# Patient Record
Sex: Female | Born: 1935
Health system: Southern US, Community
[De-identification: ages and names within clinical notes are randomized; demographics above are authoritative.]

## PROBLEM LIST (undated history)

## (undated) DIAGNOSIS — Z8673 Personal history of transient ischemic attack (TIA), and cerebral infarction without residual deficits: Secondary | ICD-10-CM

## (undated) DIAGNOSIS — K649 Unspecified hemorrhoids: Secondary | ICD-10-CM

## (undated) DIAGNOSIS — G459 Transient cerebral ischemic attack, unspecified: Secondary | ICD-10-CM

## (undated) DIAGNOSIS — U071 COVID-19: Secondary | ICD-10-CM

## (undated) DIAGNOSIS — S7292XA Unspecified fracture of left femur, initial encounter for closed fracture: Secondary | ICD-10-CM

## (undated) DIAGNOSIS — C189 Malignant neoplasm of colon, unspecified: Secondary | ICD-10-CM

## (undated) DIAGNOSIS — M545 Low back pain: Secondary | ICD-10-CM

## (undated) DIAGNOSIS — E785 Hyperlipidemia, unspecified: Secondary | ICD-10-CM

## (undated) DIAGNOSIS — Z66 Do not resuscitate: Secondary | ICD-10-CM

## (undated) DIAGNOSIS — K56609 Unspecified intestinal obstruction, unspecified as to partial versus complete obstruction: Secondary | ICD-10-CM

## (undated) DIAGNOSIS — D649 Anemia, unspecified: Secondary | ICD-10-CM

## (undated) DIAGNOSIS — K219 Gastro-esophageal reflux disease without esophagitis: Secondary | ICD-10-CM

## (undated) DIAGNOSIS — K5730 Diverticulosis of large intestine without perforation or abscess without bleeding: Secondary | ICD-10-CM

## (undated) DIAGNOSIS — J309 Allergic rhinitis, unspecified: Secondary | ICD-10-CM

## (undated) DIAGNOSIS — M8580 Other specified disorders of bone density and structure, unspecified site: Secondary | ICD-10-CM

## (undated) HISTORY — DX: Gastro-esophageal reflux disease without esophagitis: K21.9

## (undated) HISTORY — DX: Unspecified intestinal obstruction, unspecified as to partial versus complete obstruction: K56.609

## (undated) HISTORY — PX: APPENDECTOMY: SHX54

## (undated) HISTORY — DX: Diverticulosis of large intestine without perforation or abscess without bleeding: K57.30

## (undated) HISTORY — PX: JOINT REPLACEMENT: SHX530

## (undated) HISTORY — DX: Transient cerebral ischemic attack, unspecified: G45.9

## (undated) HISTORY — DX: Allergic rhinitis, unspecified: J30.9

## (undated) HISTORY — PX: BELPHAROPTOSIS REPAIR: SHX369

## (undated) HISTORY — DX: Malignant neoplasm of colon, unspecified: C18.9

## (undated) HISTORY — PX: KNEE ARTHROSCOPY: SUR90

## (undated) HISTORY — DX: Other specified disorders of bone density and structure, unspecified site: M85.80

## (undated) HISTORY — DX: Unspecified hemorrhoids: K64.9

## (undated) HISTORY — PX: ROTATOR CUFF REPAIR: SHX139

## (undated) HISTORY — DX: Hyperlipidemia, unspecified: E78.5

## (undated) HISTORY — PX: ABDOMINAL HYSTERECTOMY: SHX81

## (undated) HISTORY — DX: Anemia, unspecified: D64.9

## (undated) HISTORY — DX: Low back pain: M54.5

## (undated) HISTORY — PX: CATARACT EXTRACTION: SUR2

## (undated) HISTORY — PX: SHOULDER SURGERY: SHX246

## (undated) HISTORY — PX: VAGINAL PROLAPSE REPAIR: SHX830

---

## 1992-12-29 HISTORY — PX: HEMICOLECTOMY: SHX854

## 1995-11-03 ENCOUNTER — Encounter: Payer: Self-pay | Admitting: Gastroenterology

## 1998-04-04 ENCOUNTER — Other Ambulatory Visit: Admission: RE | Admit: 1998-04-04 | Discharge: 1998-04-04 | Payer: Self-pay | Admitting: Oncology

## 1998-12-17 ENCOUNTER — Encounter: Payer: Self-pay | Admitting: Gastroenterology

## 1998-12-17 ENCOUNTER — Ambulatory Visit (HOSPITAL_COMMUNITY): Admission: RE | Admit: 1998-12-17 | Discharge: 1998-12-17 | Payer: Self-pay | Admitting: Gastroenterology

## 1999-01-10 ENCOUNTER — Other Ambulatory Visit: Admission: RE | Admit: 1999-01-10 | Discharge: 1999-01-10 | Payer: Self-pay | Admitting: Gastroenterology

## 1999-01-10 ENCOUNTER — Encounter: Payer: Self-pay | Admitting: Gastroenterology

## 1999-09-24 ENCOUNTER — Other Ambulatory Visit: Admission: RE | Admit: 1999-09-24 | Discharge: 1999-09-24 | Payer: Self-pay | Admitting: Internal Medicine

## 2000-01-10 ENCOUNTER — Encounter: Payer: Self-pay | Admitting: Orthopedic Surgery

## 2000-01-14 ENCOUNTER — Inpatient Hospital Stay (HOSPITAL_COMMUNITY): Admission: RE | Admit: 2000-01-14 | Discharge: 2000-01-17 | Payer: Self-pay | Admitting: Orthopedic Surgery

## 2000-01-17 ENCOUNTER — Inpatient Hospital Stay (HOSPITAL_COMMUNITY)
Admission: RE | Admit: 2000-01-17 | Discharge: 2000-01-22 | Payer: Self-pay | Admitting: Physical Medicine & Rehabilitation

## 2000-01-24 ENCOUNTER — Encounter
Admission: RE | Admit: 2000-01-24 | Discharge: 2000-02-28 | Payer: Self-pay | Admitting: Physical Medicine & Rehabilitation

## 2000-03-31 ENCOUNTER — Encounter: Payer: Self-pay | Admitting: Orthopedic Surgery

## 2000-04-02 ENCOUNTER — Inpatient Hospital Stay (HOSPITAL_COMMUNITY): Admission: RE | Admit: 2000-04-02 | Discharge: 2000-04-04 | Payer: Self-pay | Admitting: Orthopedic Surgery

## 2000-06-12 ENCOUNTER — Encounter: Payer: Self-pay | Admitting: Internal Medicine

## 2000-06-12 ENCOUNTER — Encounter: Admission: RE | Admit: 2000-06-12 | Discharge: 2000-06-12 | Payer: Self-pay | Admitting: Internal Medicine

## 2001-02-03 ENCOUNTER — Encounter: Payer: Self-pay | Admitting: Gastroenterology

## 2001-05-14 ENCOUNTER — Encounter: Payer: Self-pay | Admitting: Internal Medicine

## 2001-05-14 ENCOUNTER — Encounter: Admission: RE | Admit: 2001-05-14 | Discharge: 2001-05-14 | Payer: Self-pay | Admitting: Internal Medicine

## 2001-06-28 ENCOUNTER — Encounter (INDEPENDENT_AMBULATORY_CARE_PROVIDER_SITE_OTHER): Payer: Self-pay

## 2001-06-28 ENCOUNTER — Inpatient Hospital Stay (HOSPITAL_COMMUNITY): Admission: RE | Admit: 2001-06-28 | Discharge: 2001-07-01 | Payer: Self-pay | Admitting: Obstetrics and Gynecology

## 2001-09-22 ENCOUNTER — Encounter (HOSPITAL_COMMUNITY): Admission: RE | Admit: 2001-09-22 | Discharge: 2001-10-22 | Payer: Self-pay | Admitting: Oncology

## 2001-09-22 ENCOUNTER — Encounter: Admission: RE | Admit: 2001-09-22 | Discharge: 2001-09-22 | Payer: Self-pay | Admitting: Oncology

## 2002-01-06 ENCOUNTER — Encounter: Payer: Self-pay | Admitting: Internal Medicine

## 2002-01-06 ENCOUNTER — Ambulatory Visit: Admission: RE | Admit: 2002-01-06 | Discharge: 2002-01-06 | Payer: Self-pay | Admitting: Internal Medicine

## 2002-05-18 ENCOUNTER — Encounter: Admission: RE | Admit: 2002-05-18 | Discharge: 2002-05-18 | Payer: Self-pay | Admitting: Internal Medicine

## 2002-05-18 ENCOUNTER — Encounter: Payer: Self-pay | Admitting: Internal Medicine

## 2002-10-05 ENCOUNTER — Encounter (HOSPITAL_COMMUNITY): Admission: RE | Admit: 2002-10-05 | Discharge: 2002-11-04 | Payer: Self-pay | Admitting: Oncology

## 2002-10-05 ENCOUNTER — Encounter: Admission: RE | Admit: 2002-10-05 | Discharge: 2002-10-05 | Payer: Self-pay | Admitting: Oncology

## 2002-10-07 ENCOUNTER — Encounter (HOSPITAL_COMMUNITY): Payer: Self-pay | Admitting: Oncology

## 2002-10-07 ENCOUNTER — Ambulatory Visit (HOSPITAL_COMMUNITY): Admission: RE | Admit: 2002-10-07 | Discharge: 2002-10-07 | Payer: Self-pay | Admitting: Oncology

## 2003-02-18 ENCOUNTER — Emergency Department (HOSPITAL_COMMUNITY): Admission: EM | Admit: 2003-02-18 | Discharge: 2003-02-18 | Payer: Self-pay | Admitting: Emergency Medicine

## 2003-03-19 ENCOUNTER — Ambulatory Visit (HOSPITAL_COMMUNITY): Admission: RE | Admit: 2003-03-19 | Discharge: 2003-03-19 | Payer: Self-pay | Admitting: Neurosurgery

## 2003-04-26 ENCOUNTER — Ambulatory Visit (HOSPITAL_COMMUNITY): Admission: RE | Admit: 2003-04-26 | Discharge: 2003-04-26 | Payer: Self-pay | Admitting: Orthopedic Surgery

## 2003-04-26 ENCOUNTER — Encounter: Payer: Self-pay | Admitting: Orthopedic Surgery

## 2003-05-23 ENCOUNTER — Encounter: Payer: Self-pay | Admitting: Internal Medicine

## 2003-05-23 ENCOUNTER — Encounter: Admission: RE | Admit: 2003-05-23 | Discharge: 2003-05-23 | Payer: Self-pay | Admitting: Internal Medicine

## 2003-08-03 ENCOUNTER — Ambulatory Visit (HOSPITAL_COMMUNITY): Admission: RE | Admit: 2003-08-03 | Discharge: 2003-08-03 | Payer: Self-pay | Admitting: Gastroenterology

## 2003-08-03 ENCOUNTER — Encounter: Payer: Self-pay | Admitting: Gastroenterology

## 2003-08-17 ENCOUNTER — Encounter: Payer: Self-pay | Admitting: Gastroenterology

## 2003-08-17 ENCOUNTER — Ambulatory Visit (HOSPITAL_COMMUNITY): Admission: RE | Admit: 2003-08-17 | Discharge: 2003-08-17 | Payer: Self-pay | Admitting: Gastroenterology

## 2003-10-06 ENCOUNTER — Encounter (HOSPITAL_COMMUNITY): Admission: RE | Admit: 2003-10-06 | Discharge: 2003-11-05 | Payer: Self-pay | Admitting: Oncology

## 2003-10-06 ENCOUNTER — Encounter: Payer: Self-pay | Admitting: Gastroenterology

## 2003-10-06 ENCOUNTER — Encounter: Admission: RE | Admit: 2003-10-06 | Discharge: 2003-10-06 | Payer: Self-pay | Admitting: Oncology

## 2003-11-17 ENCOUNTER — Encounter (HOSPITAL_COMMUNITY): Admission: RE | Admit: 2003-11-17 | Discharge: 2003-12-27 | Payer: Self-pay | Admitting: Oncology

## 2003-11-27 ENCOUNTER — Encounter: Admission: RE | Admit: 2003-11-27 | Discharge: 2003-11-27 | Payer: Self-pay | Admitting: Oncology

## 2004-03-14 ENCOUNTER — Observation Stay (HOSPITAL_COMMUNITY): Admission: RE | Admit: 2004-03-14 | Discharge: 2004-03-15 | Payer: Self-pay | Admitting: Obstetrics and Gynecology

## 2004-05-24 ENCOUNTER — Encounter: Admission: RE | Admit: 2004-05-24 | Discharge: 2004-05-24 | Payer: Self-pay | Admitting: Internal Medicine

## 2004-10-02 ENCOUNTER — Encounter: Payer: Self-pay | Admitting: Gastroenterology

## 2004-10-02 ENCOUNTER — Encounter: Admission: RE | Admit: 2004-10-02 | Discharge: 2004-10-02 | Payer: Self-pay | Admitting: Oncology

## 2004-10-02 ENCOUNTER — Encounter (HOSPITAL_COMMUNITY): Admission: RE | Admit: 2004-10-02 | Discharge: 2004-11-01 | Payer: Self-pay | Admitting: Oncology

## 2004-12-18 ENCOUNTER — Ambulatory Visit: Payer: Self-pay | Admitting: Internal Medicine

## 2004-12-20 ENCOUNTER — Encounter: Admission: RE | Admit: 2004-12-20 | Discharge: 2004-12-20 | Payer: Self-pay | Admitting: Internal Medicine

## 2005-01-01 ENCOUNTER — Ambulatory Visit: Payer: Self-pay | Admitting: Internal Medicine

## 2005-01-30 ENCOUNTER — Ambulatory Visit: Payer: Self-pay | Admitting: Internal Medicine

## 2005-01-30 ENCOUNTER — Inpatient Hospital Stay (HOSPITAL_COMMUNITY): Admission: EM | Admit: 2005-01-30 | Discharge: 2005-02-11 | Payer: Self-pay | Admitting: Internal Medicine

## 2005-02-02 ENCOUNTER — Encounter (INDEPENDENT_AMBULATORY_CARE_PROVIDER_SITE_OTHER): Payer: Self-pay | Admitting: *Deleted

## 2005-02-02 ENCOUNTER — Encounter (INDEPENDENT_AMBULATORY_CARE_PROVIDER_SITE_OTHER): Payer: Self-pay | Admitting: Specialist

## 2005-02-27 ENCOUNTER — Ambulatory Visit: Payer: Self-pay | Admitting: Internal Medicine

## 2005-03-27 ENCOUNTER — Ambulatory Visit: Payer: Self-pay | Admitting: Internal Medicine

## 2005-05-27 ENCOUNTER — Encounter: Admission: RE | Admit: 2005-05-27 | Discharge: 2005-05-27 | Payer: Self-pay | Admitting: Internal Medicine

## 2005-07-24 ENCOUNTER — Ambulatory Visit: Payer: Self-pay | Admitting: Internal Medicine

## 2005-08-08 ENCOUNTER — Ambulatory Visit: Payer: Self-pay | Admitting: Internal Medicine

## 2005-09-05 ENCOUNTER — Ambulatory Visit: Payer: Self-pay | Admitting: Internal Medicine

## 2005-09-05 ENCOUNTER — Encounter: Admission: RE | Admit: 2005-09-05 | Discharge: 2005-09-05 | Payer: Self-pay | Admitting: Internal Medicine

## 2005-09-16 ENCOUNTER — Ambulatory Visit: Payer: Self-pay | Admitting: Internal Medicine

## 2005-12-09 ENCOUNTER — Ambulatory Visit: Payer: Self-pay | Admitting: Internal Medicine

## 2005-12-14 ENCOUNTER — Encounter: Admission: RE | Admit: 2005-12-14 | Discharge: 2005-12-14 | Payer: Self-pay | Admitting: Internal Medicine

## 2005-12-19 ENCOUNTER — Ambulatory Visit: Payer: Self-pay | Admitting: Internal Medicine

## 2005-12-29 HISTORY — PX: BACK SURGERY: SHX140

## 2006-06-16 ENCOUNTER — Encounter: Admission: RE | Admit: 2006-06-16 | Discharge: 2006-06-16 | Payer: Self-pay | Admitting: Neurosurgery

## 2006-06-22 ENCOUNTER — Encounter: Admission: RE | Admit: 2006-06-22 | Discharge: 2006-06-22 | Payer: Self-pay | Admitting: Internal Medicine

## 2006-07-10 ENCOUNTER — Inpatient Hospital Stay (HOSPITAL_COMMUNITY): Admission: RE | Admit: 2006-07-10 | Discharge: 2006-07-13 | Payer: Self-pay | Admitting: Neurosurgery

## 2006-08-19 ENCOUNTER — Encounter: Admission: RE | Admit: 2006-08-19 | Discharge: 2006-08-19 | Payer: Self-pay | Admitting: Neurosurgery

## 2006-09-10 ENCOUNTER — Ambulatory Visit: Payer: Self-pay | Admitting: Gastroenterology

## 2006-10-30 ENCOUNTER — Ambulatory Visit: Payer: Self-pay | Admitting: Internal Medicine

## 2006-11-17 ENCOUNTER — Ambulatory Visit: Payer: Self-pay | Admitting: Internal Medicine

## 2006-11-17 DIAGNOSIS — D649 Anemia, unspecified: Secondary | ICD-10-CM

## 2006-11-17 DIAGNOSIS — K219 Gastro-esophageal reflux disease without esophagitis: Secondary | ICD-10-CM | POA: Insufficient documentation

## 2006-11-17 DIAGNOSIS — M899 Disorder of bone, unspecified: Secondary | ICD-10-CM | POA: Insufficient documentation

## 2006-11-17 DIAGNOSIS — E785 Hyperlipidemia, unspecified: Secondary | ICD-10-CM

## 2006-11-17 DIAGNOSIS — M8580 Other specified disorders of bone density and structure, unspecified site: Secondary | ICD-10-CM

## 2006-11-17 DIAGNOSIS — M949 Disorder of cartilage, unspecified: Secondary | ICD-10-CM

## 2006-11-17 HISTORY — DX: Gastro-esophageal reflux disease without esophagitis: K21.9

## 2006-11-17 HISTORY — DX: Other specified disorders of bone density and structure, unspecified site: M85.80

## 2006-11-17 HISTORY — DX: Hyperlipidemia, unspecified: E78.5

## 2006-11-17 HISTORY — DX: Anemia, unspecified: D64.9

## 2006-11-17 LAB — CONVERTED CEMR LAB
ALT: 14 units/L (ref 0–40)
AST: 26 units/L (ref 0–37)
Albumin: 3.6 g/dL (ref 3.5–5.2)
Alkaline Phosphatase: 60 units/L (ref 39–117)
BUN: 12 mg/dL (ref 6–23)
Bilirubin, Direct: 0.1 mg/dL (ref 0.0–0.3)
CEA: 1.5 ng/mL (ref 0.0–5.0)
CO2: 30 meq/L (ref 19–32)
Calcium: 9.1 mg/dL (ref 8.4–10.5)
Chloride: 104 meq/L (ref 96–112)
Creatinine, Ser: 0.6 mg/dL (ref 0.4–1.2)
GFR calc non Af Amer: 101 mL/min
Glomerular Filtration Rate, Af Am: 123 mL/min/{1.73_m2}
Glucose, Bld: 97 mg/dL (ref 70–99)
HCT: 32.3 % — ABNORMAL LOW (ref 36.0–46.0)
Hemoglobin: 10.6 g/dL — ABNORMAL LOW (ref 12.0–15.0)
MCHC: 32.8 g/dL (ref 30.0–36.0)
MCV: 86.9 fL (ref 78.0–100.0)
Platelets: 264 10*3/uL (ref 150–400)
Potassium: 4.3 meq/L (ref 3.5–5.1)
RBC: 3.71 M/uL — ABNORMAL LOW (ref 3.87–5.11)
RDW: 13.5 % (ref 11.5–14.6)
Sodium: 140 meq/L (ref 135–145)
Total Bilirubin: 0.8 mg/dL (ref 0.3–1.2)
Total Protein: 6.9 g/dL (ref 6.0–8.3)
WBC: 5.3 10*3/uL (ref 4.5–10.5)

## 2006-12-08 ENCOUNTER — Ambulatory Visit: Payer: Self-pay | Admitting: Internal Medicine

## 2006-12-08 LAB — CONVERTED CEMR LAB
Cholesterol, target level: 200 mg/dL
HDL goal, serum: 40 mg/dL
LDL Goal: 130 mg/dL

## 2006-12-29 HISTORY — PX: PARTIAL COLECTOMY: SHX5273

## 2007-01-15 ENCOUNTER — Ambulatory Visit: Payer: Self-pay | Admitting: Internal Medicine

## 2007-02-12 ENCOUNTER — Ambulatory Visit: Payer: Self-pay | Admitting: Internal Medicine

## 2007-03-30 ENCOUNTER — Ambulatory Visit: Payer: Self-pay | Admitting: Gastroenterology

## 2007-04-12 ENCOUNTER — Ambulatory Visit: Payer: Self-pay | Admitting: Gastroenterology

## 2007-04-12 ENCOUNTER — Encounter: Payer: Self-pay | Admitting: Internal Medicine

## 2007-04-12 LAB — CONVERTED CEMR LAB
BUN: 8 mg/dL (ref 6–23)
Creatinine, Ser: 0.5 mg/dL (ref 0.4–1.2)

## 2007-04-14 ENCOUNTER — Ambulatory Visit: Payer: Self-pay | Admitting: *Deleted

## 2007-04-14 ENCOUNTER — Ambulatory Visit: Payer: Self-pay | Admitting: Internal Medicine

## 2007-05-11 ENCOUNTER — Encounter: Payer: Self-pay | Admitting: Gastroenterology

## 2007-05-11 ENCOUNTER — Encounter (INDEPENDENT_AMBULATORY_CARE_PROVIDER_SITE_OTHER): Payer: Self-pay | Admitting: *Deleted

## 2007-05-11 ENCOUNTER — Inpatient Hospital Stay (HOSPITAL_COMMUNITY): Admission: RE | Admit: 2007-05-11 | Discharge: 2007-05-15 | Payer: Self-pay | Admitting: Surgery

## 2007-05-11 ENCOUNTER — Encounter (INDEPENDENT_AMBULATORY_CARE_PROVIDER_SITE_OTHER): Payer: Self-pay | Admitting: Specialist

## 2007-05-13 ENCOUNTER — Ambulatory Visit: Payer: Self-pay | Admitting: Oncology

## 2007-05-16 ENCOUNTER — Encounter (INDEPENDENT_AMBULATORY_CARE_PROVIDER_SITE_OTHER): Payer: Self-pay | Admitting: *Deleted

## 2007-05-17 ENCOUNTER — Ambulatory Visit: Payer: Self-pay | Admitting: Oncology

## 2007-05-31 ENCOUNTER — Encounter: Payer: Self-pay | Admitting: Internal Medicine

## 2007-06-09 ENCOUNTER — Encounter (INDEPENDENT_AMBULATORY_CARE_PROVIDER_SITE_OTHER): Payer: Self-pay | Admitting: *Deleted

## 2007-06-10 ENCOUNTER — Ambulatory Visit (HOSPITAL_COMMUNITY): Admission: RE | Admit: 2007-06-10 | Discharge: 2007-06-10 | Payer: Self-pay | Admitting: Oncology

## 2007-06-24 ENCOUNTER — Encounter: Payer: Self-pay | Admitting: Gastroenterology

## 2007-06-24 LAB — CBC WITH DIFFERENTIAL/PLATELET
BASO%: 0.3 % (ref 0.0–2.0)
Basophils Absolute: 0 10*3/uL (ref 0.0–0.1)
EOS%: 3.4 % (ref 0.0–7.0)
Eosinophils Absolute: 0.2 10*3/uL (ref 0.0–0.5)
HCT: 25.7 % — ABNORMAL LOW (ref 34.8–46.6)
HGB: 8.6 g/dL — ABNORMAL LOW (ref 11.6–15.9)
LYMPH%: 30.3 % (ref 14.0–48.0)
MCH: 27 pg (ref 26.0–34.0)
MCHC: 33.4 g/dL (ref 32.0–36.0)
MCV: 80.9 fL — ABNORMAL LOW (ref 81.0–101.0)
MONO#: 0.7 10*3/uL (ref 0.1–0.9)
MONO%: 11.8 % (ref 0.0–13.0)
NEUT#: 3.2 10*3/uL (ref 1.5–6.5)
NEUT%: 54.2 % (ref 39.6–76.8)
Platelets: 268 10*3/uL (ref 145–400)
RBC: 3.17 10*6/uL — ABNORMAL LOW (ref 3.70–5.32)
RDW: 16.4 % — ABNORMAL HIGH (ref 11.3–14.5)
WBC: 5.9 10*3/uL (ref 3.9–10.0)
lymph#: 1.8 10*3/uL (ref 0.9–3.3)

## 2007-06-24 LAB — COMPREHENSIVE METABOLIC PANEL
ALT: 9 U/L (ref 0–35)
AST: 19 U/L (ref 0–37)
Albumin: 3.8 g/dL (ref 3.5–5.2)
Alkaline Phosphatase: 58 U/L (ref 39–117)
BUN: 11 mg/dL (ref 6–23)
CO2: 28 mEq/L (ref 19–32)
Calcium: 8.8 mg/dL (ref 8.4–10.5)
Chloride: 102 mEq/L (ref 96–112)
Creatinine, Ser: 0.58 mg/dL (ref 0.40–1.20)
Glucose, Bld: 92 mg/dL (ref 70–99)
Potassium: 4.1 mEq/L (ref 3.5–5.3)
Sodium: 137 mEq/L (ref 135–145)
Total Bilirubin: 0.3 mg/dL (ref 0.3–1.2)
Total Protein: 6.6 g/dL (ref 6.0–8.3)

## 2007-06-30 ENCOUNTER — Ambulatory Visit: Payer: Self-pay | Admitting: Oncology

## 2007-07-01 ENCOUNTER — Encounter: Admission: RE | Admit: 2007-07-01 | Discharge: 2007-07-01 | Payer: Self-pay | Admitting: Internal Medicine

## 2007-07-05 LAB — CBC WITH DIFFERENTIAL/PLATELET
BASO%: 0.6 % (ref 0.0–2.0)
Basophils Absolute: 0 10*3/uL (ref 0.0–0.1)
EOS%: 4.7 % (ref 0.0–7.0)
Eosinophils Absolute: 0.2 10*3/uL (ref 0.0–0.5)
HCT: 27.1 % — ABNORMAL LOW (ref 34.8–46.6)
HGB: 9.1 g/dL — ABNORMAL LOW (ref 11.6–15.9)
LYMPH%: 35.4 % (ref 14.0–48.0)
MCH: 27.8 pg (ref 26.0–34.0)
MCHC: 33.5 g/dL (ref 32.0–36.0)
MCV: 82.9 fL (ref 81.0–101.0)
MONO#: 0.6 10*3/uL (ref 0.1–0.9)
MONO%: 13.3 % — ABNORMAL HIGH (ref 0.0–13.0)
NEUT#: 2 10*3/uL (ref 1.5–6.5)
NEUT%: 46 % (ref 39.6–76.8)
Platelets: 240 10*3/uL (ref 145–400)
RBC: 3.27 10*6/uL — ABNORMAL LOW (ref 3.70–5.32)
RDW: 20.3 % — ABNORMAL HIGH (ref 11.3–14.5)
WBC: 4.4 10*3/uL (ref 3.9–10.0)
lymph#: 1.5 10*3/uL (ref 0.9–3.3)

## 2007-07-06 ENCOUNTER — Ambulatory Visit: Payer: Self-pay | Admitting: Internal Medicine

## 2007-07-15 ENCOUNTER — Encounter: Payer: Self-pay | Admitting: Internal Medicine

## 2007-07-15 LAB — CBC WITH DIFFERENTIAL/PLATELET
BASO%: 0.6 % (ref 0.0–2.0)
Basophils Absolute: 0 10*3/uL (ref 0.0–0.1)
EOS%: 4.2 % (ref 0.0–7.0)
Eosinophils Absolute: 0.2 10*3/uL (ref 0.0–0.5)
HCT: 29.5 % — ABNORMAL LOW (ref 34.8–46.6)
HGB: 9.9 g/dL — ABNORMAL LOW (ref 11.6–15.9)
LYMPH%: 37.4 % (ref 14.0–48.0)
MCH: 28.7 pg (ref 26.0–34.0)
MCHC: 33.6 g/dL (ref 32.0–36.0)
MCV: 85.4 fL (ref 81.0–101.0)
MONO#: 0.7 10*3/uL (ref 0.1–0.9)
MONO%: 14.9 % — ABNORMAL HIGH (ref 0.0–13.0)
NEUT#: 2 10*3/uL (ref 1.5–6.5)
NEUT%: 42.9 % (ref 39.6–76.8)
Platelets: 223 10*3/uL (ref 145–400)
RBC: 3.46 10*6/uL — ABNORMAL LOW (ref 3.70–5.32)
RDW: 25.5 % — ABNORMAL HIGH (ref 11.3–14.5)
WBC: 4.6 10*3/uL (ref 3.9–10.0)
lymph#: 1.7 10*3/uL (ref 0.9–3.3)

## 2007-07-15 LAB — COMPREHENSIVE METABOLIC PANEL
ALT: 9 U/L (ref 0–35)
AST: 20 U/L (ref 0–37)
Albumin: 3.9 g/dL (ref 3.5–5.2)
Alkaline Phosphatase: 52 U/L (ref 39–117)
BUN: 10 mg/dL (ref 6–23)
CO2: 28 mEq/L (ref 19–32)
Calcium: 8.6 mg/dL (ref 8.4–10.5)
Chloride: 103 mEq/L (ref 96–112)
Creatinine, Ser: 0.6 mg/dL (ref 0.40–1.20)
Glucose, Bld: 82 mg/dL (ref 70–99)
Potassium: 4.5 mEq/L (ref 3.5–5.3)
Sodium: 140 mEq/L (ref 135–145)
Total Bilirubin: 0.3 mg/dL (ref 0.3–1.2)
Total Protein: 6.6 g/dL (ref 6.0–8.3)

## 2007-08-06 LAB — COMPREHENSIVE METABOLIC PANEL
ALT: 9 U/L (ref 0–35)
AST: 20 U/L (ref 0–37)
Albumin: 3.8 g/dL (ref 3.5–5.2)
Alkaline Phosphatase: 44 U/L (ref 39–117)
BUN: 14 mg/dL (ref 6–23)
CO2: 27 mEq/L (ref 19–32)
Calcium: 9.4 mg/dL (ref 8.4–10.5)
Chloride: 104 mEq/L (ref 96–112)
Creatinine, Ser: 0.59 mg/dL (ref 0.40–1.20)
Glucose, Bld: 81 mg/dL (ref 70–99)
Potassium: 4.1 mEq/L (ref 3.5–5.3)
Sodium: 140 mEq/L (ref 135–145)
Total Bilirubin: 0.5 mg/dL (ref 0.3–1.2)
Total Protein: 6.4 g/dL (ref 6.0–8.3)

## 2007-08-06 LAB — CBC WITH DIFFERENTIAL/PLATELET
BASO%: 0.7 % (ref 0.0–2.0)
Basophils Absolute: 0 10*3/uL (ref 0.0–0.1)
EOS%: 2.5 % (ref 0.0–7.0)
Eosinophils Absolute: 0.1 10*3/uL (ref 0.0–0.5)
HCT: 31.2 % — ABNORMAL LOW (ref 34.8–46.6)
HGB: 10.5 g/dL — ABNORMAL LOW (ref 11.6–15.9)
LYMPH%: 41.6 % (ref 14.0–48.0)
MCH: 30 pg (ref 26.0–34.0)
MCHC: 33.7 g/dL (ref 32.0–36.0)
MCV: 88.9 fL (ref 81.0–101.0)
MONO#: 0.5 10*3/uL (ref 0.1–0.9)
MONO%: 9.2 % (ref 0.0–13.0)
NEUT#: 2.6 10*3/uL (ref 1.5–6.5)
NEUT%: 46 % (ref 39.6–76.8)
Platelets: 204 10*3/uL (ref 145–400)
RBC: 3.5 10*6/uL — ABNORMAL LOW (ref 3.70–5.32)
RDW: 28.7 % — ABNORMAL HIGH (ref 11.3–14.5)
WBC: 5.6 10*3/uL (ref 3.9–10.0)
lymph#: 2.3 10*3/uL (ref 0.9–3.3)

## 2007-08-19 ENCOUNTER — Ambulatory Visit: Payer: Self-pay | Admitting: Internal Medicine

## 2007-08-19 DIAGNOSIS — Z85038 Personal history of other malignant neoplasm of large intestine: Secondary | ICD-10-CM | POA: Insufficient documentation

## 2007-08-19 DIAGNOSIS — C189 Malignant neoplasm of colon, unspecified: Secondary | ICD-10-CM

## 2007-08-19 HISTORY — DX: Malignant neoplasm of colon, unspecified: C18.9

## 2007-08-26 ENCOUNTER — Ambulatory Visit: Payer: Self-pay | Admitting: Oncology

## 2007-08-31 ENCOUNTER — Telehealth: Payer: Self-pay | Admitting: Internal Medicine

## 2007-08-31 LAB — CBC WITH DIFFERENTIAL/PLATELET
BASO%: 0.9 % (ref 0.0–2.0)
Basophils Absolute: 0.1 10*3/uL (ref 0.0–0.1)
EOS%: 2.2 % (ref 0.0–7.0)
Eosinophils Absolute: 0.1 10*3/uL (ref 0.0–0.5)
HCT: 30.9 % — ABNORMAL LOW (ref 34.8–46.6)
HGB: 10.8 g/dL — ABNORMAL LOW (ref 11.6–15.9)
LYMPH%: 33.4 % (ref 14.0–48.0)
MCH: 32.4 pg (ref 26.0–34.0)
MCHC: 35 g/dL (ref 32.0–36.0)
MCV: 92.6 fL (ref 81.0–101.0)
MONO#: 0.5 10*3/uL (ref 0.1–0.9)
MONO%: 9 % (ref 0.0–13.0)
NEUT#: 3 10*3/uL (ref 1.5–6.5)
NEUT%: 54.5 % (ref 39.6–76.8)
Platelets: 183 10*3/uL (ref 145–400)
RBC: 3.34 10*6/uL — ABNORMAL LOW (ref 3.70–5.32)
RDW: 28.9 % — ABNORMAL HIGH (ref 11.3–14.5)
WBC: 5.6 10*3/uL (ref 3.9–10.0)
lymph#: 1.9 10*3/uL (ref 0.9–3.3)

## 2007-09-03 ENCOUNTER — Telehealth: Payer: Self-pay | Admitting: Internal Medicine

## 2007-09-13 ENCOUNTER — Encounter: Payer: Self-pay | Admitting: Internal Medicine

## 2007-09-21 ENCOUNTER — Encounter: Payer: Self-pay | Admitting: Internal Medicine

## 2007-09-21 LAB — COMPREHENSIVE METABOLIC PANEL
ALT: 12 U/L (ref 0–35)
AST: 19 U/L (ref 0–37)
Albumin: 4.1 g/dL (ref 3.5–5.2)
Alkaline Phosphatase: 53 U/L (ref 39–117)
BUN: 14 mg/dL (ref 6–23)
CO2: 28 mEq/L (ref 19–32)
Calcium: 9 mg/dL (ref 8.4–10.5)
Chloride: 102 mEq/L (ref 96–112)
Creatinine, Ser: 0.65 mg/dL (ref 0.40–1.20)
Glucose, Bld: 96 mg/dL (ref 70–99)
Potassium: 4.1 mEq/L (ref 3.5–5.3)
Sodium: 138 mEq/L (ref 135–145)
Total Bilirubin: 0.4 mg/dL (ref 0.3–1.2)
Total Protein: 7 g/dL (ref 6.0–8.3)

## 2007-09-21 LAB — CBC WITH DIFFERENTIAL/PLATELET
BASO%: 0.8 % (ref 0.0–2.0)
Basophils Absolute: 0.1 10*3/uL (ref 0.0–0.1)
EOS%: 0.9 % (ref 0.0–7.0)
Eosinophils Absolute: 0.1 10*3/uL (ref 0.0–0.5)
HCT: 33.1 % — ABNORMAL LOW (ref 34.8–46.6)
HGB: 11.4 g/dL — ABNORMAL LOW (ref 11.6–15.9)
LYMPH%: 27.3 % (ref 14.0–48.0)
MCH: 33.2 pg (ref 26.0–34.0)
MCHC: 34.5 g/dL (ref 32.0–36.0)
MCV: 96.2 fL (ref 81.0–101.0)
MONO#: 0.7 10*3/uL (ref 0.1–0.9)
MONO%: 9.3 % (ref 0.0–13.0)
NEUT#: 4.7 10*3/uL (ref 1.5–6.5)
NEUT%: 61.7 % (ref 39.6–76.8)
Platelets: 196 10*3/uL (ref 145–400)
RBC: 3.45 10*6/uL — ABNORMAL LOW (ref 3.70–5.32)
RDW: 25.7 % — ABNORMAL HIGH (ref 11.3–14.5)
WBC: 7.6 10*3/uL (ref 3.9–10.0)
lymph#: 2.1 10*3/uL (ref 0.9–3.3)

## 2007-10-11 ENCOUNTER — Ambulatory Visit: Payer: Self-pay | Admitting: Oncology

## 2007-10-11 LAB — COMPREHENSIVE METABOLIC PANEL
ALT: 13 U/L (ref 0–35)
AST: 19 U/L (ref 0–37)
Albumin: 3.8 g/dL (ref 3.5–5.2)
Alkaline Phosphatase: 61 U/L (ref 39–117)
BUN: 13 mg/dL (ref 6–23)
CO2: 27 mEq/L (ref 19–32)
Calcium: 8.7 mg/dL (ref 8.4–10.5)
Chloride: 103 mEq/L (ref 96–112)
Creatinine, Ser: 0.6 mg/dL (ref 0.40–1.20)
Glucose, Bld: 97 mg/dL (ref 70–99)
Potassium: 3.5 mEq/L (ref 3.5–5.3)
Sodium: 140 mEq/L (ref 135–145)
Total Bilirubin: 0.6 mg/dL (ref 0.3–1.2)
Total Protein: 6.5 g/dL (ref 6.0–8.3)

## 2007-10-11 LAB — CBC WITH DIFFERENTIAL/PLATELET
BASO%: 1 % (ref 0.0–2.0)
Basophils Absolute: 0.1 10*3/uL (ref 0.0–0.1)
EOS%: 2.4 % (ref 0.0–7.0)
Eosinophils Absolute: 0.2 10*3/uL (ref 0.0–0.5)
HCT: 32.2 % — ABNORMAL LOW (ref 34.8–46.6)
HGB: 11.3 g/dL — ABNORMAL LOW (ref 11.6–15.9)
LYMPH%: 31.8 % (ref 14.0–48.0)
MCH: 35.5 pg — ABNORMAL HIGH (ref 26.0–34.0)
MCHC: 35 g/dL (ref 32.0–36.0)
MCV: 101.5 fL — ABNORMAL HIGH (ref 81.0–101.0)
MONO#: 0.6 10*3/uL (ref 0.1–0.9)
MONO%: 9.3 % (ref 0.0–13.0)
NEUT#: 3.6 10*3/uL (ref 1.5–6.5)
NEUT%: 55.5 % (ref 39.6–76.8)
Platelets: 171 10*3/uL (ref 145–400)
RBC: 3.17 10*6/uL — ABNORMAL LOW (ref 3.70–5.32)
RDW: 20.2 % — ABNORMAL HIGH (ref 11.3–14.5)
WBC: 6.4 10*3/uL (ref 3.9–10.0)
lymph#: 2 10*3/uL (ref 0.9–3.3)

## 2007-10-25 LAB — CBC WITH DIFFERENTIAL/PLATELET
BASO%: 0.6 % (ref 0.0–2.0)
Basophils Absolute: 0 10*3/uL (ref 0.0–0.1)
EOS%: 2.8 % (ref 0.0–7.0)
Eosinophils Absolute: 0.2 10*3/uL (ref 0.0–0.5)
HCT: 35.2 % (ref 34.8–46.6)
HGB: 12.1 g/dL (ref 11.6–15.9)
LYMPH%: 32.3 % (ref 14.0–48.0)
MCH: 35.5 pg — ABNORMAL HIGH (ref 26.0–34.0)
MCHC: 34.5 g/dL (ref 32.0–36.0)
MCV: 102.8 fL — ABNORMAL HIGH (ref 81.0–101.0)
MONO#: 0.6 10*3/uL (ref 0.1–0.9)
MONO%: 10.3 % (ref 0.0–13.0)
NEUT#: 3.2 10*3/uL (ref 1.5–6.5)
NEUT%: 54 % (ref 39.6–76.8)
Platelets: 197 10*3/uL (ref 145–400)
RBC: 3.42 10*6/uL — ABNORMAL LOW (ref 3.70–5.32)
RDW: 18.4 % — ABNORMAL HIGH (ref 11.3–14.5)
WBC: 6 10*3/uL (ref 3.9–10.0)
lymph#: 1.9 10*3/uL (ref 0.9–3.3)

## 2007-10-27 ENCOUNTER — Telehealth: Payer: Self-pay | Admitting: Internal Medicine

## 2007-11-15 ENCOUNTER — Encounter: Payer: Self-pay | Admitting: Internal Medicine

## 2007-11-15 LAB — CBC WITH DIFFERENTIAL/PLATELET
BASO%: 0.7 % (ref 0.0–2.0)
Basophils Absolute: 0 10*3/uL (ref 0.0–0.1)
EOS%: 4.8 % (ref 0.0–7.0)
Eosinophils Absolute: 0.3 10*3/uL (ref 0.0–0.5)
HCT: 33.7 % — ABNORMAL LOW (ref 34.8–46.6)
HGB: 11.6 g/dL (ref 11.6–15.9)
LYMPH%: 42.1 % (ref 14.0–48.0)
MCH: 35.4 pg — ABNORMAL HIGH (ref 26.0–34.0)
MCHC: 34.3 g/dL (ref 32.0–36.0)
MCV: 103.2 fL — ABNORMAL HIGH (ref 81.0–101.0)
MONO#: 0.6 10*3/uL (ref 0.1–0.9)
MONO%: 11.6 % (ref 0.0–13.0)
NEUT#: 2.3 10*3/uL (ref 1.5–6.5)
NEUT%: 40.8 % (ref 39.6–76.8)
Platelets: 227 10*3/uL (ref 145–400)
RBC: 3.26 10*6/uL — ABNORMAL LOW (ref 3.70–5.32)
RDW: 17.5 % — ABNORMAL HIGH (ref 11.3–14.5)
WBC: 5.6 10*3/uL (ref 3.9–10.0)
lymph#: 2.3 10*3/uL (ref 0.9–3.3)

## 2007-11-18 ENCOUNTER — Ambulatory Visit: Payer: Self-pay | Admitting: Internal Medicine

## 2007-11-18 LAB — CONVERTED CEMR LAB
Bilirubin Urine: NEGATIVE
Blood in Urine, dipstick: NEGATIVE
Glucose, Urine, Semiquant: NEGATIVE
Ketones, urine, test strip: NEGATIVE
Nitrite: NEGATIVE
Protein, U semiquant: NEGATIVE
Specific Gravity, Urine: 1.02
Urobilinogen, UA: 0.2
WBC Urine, dipstick: NEGATIVE
pH: 7

## 2007-11-29 ENCOUNTER — Ambulatory Visit: Payer: Self-pay | Admitting: Internal Medicine

## 2007-11-29 ENCOUNTER — Telehealth: Payer: Self-pay | Admitting: Internal Medicine

## 2007-12-15 ENCOUNTER — Ambulatory Visit: Payer: Self-pay | Admitting: Oncology

## 2007-12-17 ENCOUNTER — Encounter: Payer: Self-pay | Admitting: Internal Medicine

## 2007-12-17 LAB — CBC WITH DIFFERENTIAL/PLATELET
BASO%: 0.8 % (ref 0.0–2.0)
Basophils Absolute: 0.1 10*3/uL (ref 0.0–0.1)
EOS%: 2.2 % (ref 0.0–7.0)
Eosinophils Absolute: 0.2 10*3/uL (ref 0.0–0.5)
HCT: 33.6 % — ABNORMAL LOW (ref 34.8–46.6)
HGB: 11.5 g/dL — ABNORMAL LOW (ref 11.6–15.9)
LYMPH%: 35.5 % (ref 14.0–48.0)
MCH: 35 pg — ABNORMAL HIGH (ref 26.0–34.0)
MCHC: 34.2 g/dL (ref 32.0–36.0)
MCV: 102.4 fL — ABNORMAL HIGH (ref 81.0–101.0)
MONO#: 0.6 10*3/uL (ref 0.1–0.9)
MONO%: 8.1 % (ref 0.0–13.0)
NEUT#: 3.7 10*3/uL (ref 1.5–6.5)
NEUT%: 53.4 % (ref 39.6–76.8)
Platelets: 238 10*3/uL (ref 145–400)
RBC: 3.28 10*6/uL — ABNORMAL LOW (ref 3.70–5.32)
RDW: 15.2 % — ABNORMAL HIGH (ref 11.3–14.5)
WBC: 6.8 10*3/uL (ref 3.9–10.0)
lymph#: 2.4 10*3/uL (ref 0.9–3.3)

## 2007-12-17 LAB — CEA: CEA: 1.1 ng/mL (ref 0.0–5.0)

## 2007-12-21 ENCOUNTER — Encounter: Payer: Self-pay | Admitting: Internal Medicine

## 2008-02-09 ENCOUNTER — Ambulatory Visit: Payer: Self-pay | Admitting: Internal Medicine

## 2008-02-09 ENCOUNTER — Inpatient Hospital Stay (HOSPITAL_COMMUNITY): Admission: AD | Admit: 2008-02-09 | Discharge: 2008-02-14 | Payer: Self-pay | Admitting: Internal Medicine

## 2008-02-09 LAB — CONVERTED CEMR LAB
ALT: 16 units/L (ref 0–35)
AST: 25 units/L (ref 0–37)
Albumin: 3.8 g/dL (ref 3.5–5.2)
Alkaline Phosphatase: 51 units/L (ref 39–117)
Amylase: 92 units/L (ref 27–131)
BUN: 16 mg/dL (ref 6–23)
Basophils Absolute: 0 10*3/uL (ref 0.0–0.1)
Basophils Relative: 0.1 % (ref 0.0–1.0)
Bilirubin Urine: NEGATIVE
Bilirubin, Direct: 0.1 mg/dL (ref 0.0–0.3)
Blood in Urine, dipstick: NEGATIVE
CO2: 35 meq/L — ABNORMAL HIGH (ref 19–32)
Calcium: 10 mg/dL (ref 8.4–10.5)
Chloride: 96 meq/L (ref 96–112)
Creatinine, Ser: 0.8 mg/dL (ref 0.4–1.2)
Eosinophils Absolute: 0.1 10*3/uL (ref 0.0–0.6)
Eosinophils Relative: 0.8 % (ref 0.0–5.0)
GFR calc Af Amer: 88 mL/min
GFR calc non Af Amer: 73 mL/min
Glucose, Bld: 108 mg/dL — ABNORMAL HIGH (ref 70–99)
Glucose, Urine, Semiquant: NEGATIVE
HCT: 39.7 % (ref 36.0–46.0)
Hemoglobin: 13.1 g/dL (ref 12.0–15.0)
Ketones, urine, test strip: NEGATIVE
Lymphocytes Relative: 21.9 % (ref 12.0–46.0)
MCHC: 33 g/dL (ref 30.0–36.0)
MCV: 95.6 fL (ref 78.0–100.0)
Monocytes Absolute: 0.8 10*3/uL — ABNORMAL HIGH (ref 0.2–0.7)
Monocytes Relative: 8.3 % (ref 3.0–11.0)
Neutro Abs: 6.8 10*3/uL (ref 1.4–7.7)
Neutrophils Relative %: 68.9 % (ref 43.0–77.0)
Nitrite: NEGATIVE
Platelets: 236 10*3/uL (ref 150–400)
Potassium: 4.6 meq/L (ref 3.5–5.1)
RBC: 4.15 M/uL (ref 3.87–5.11)
RDW: 12 % (ref 11.5–14.6)
Sodium: 140 meq/L (ref 135–145)
Specific Gravity, Urine: 1.015
Total Bilirubin: 0.8 mg/dL (ref 0.3–1.2)
Total Protein: 6.7 g/dL (ref 6.0–8.3)
Urobilinogen, UA: 0.2
WBC Urine, dipstick: NEGATIVE
WBC: 9.9 10*3/uL (ref 4.5–10.5)
pH: 7.5

## 2008-02-21 ENCOUNTER — Ambulatory Visit: Payer: Self-pay | Admitting: Internal Medicine

## 2008-02-22 LAB — CONVERTED CEMR LAB
Basophils Absolute: 0.1 10*3/uL (ref 0.0–0.1)
Basophils Relative: 0.9 % (ref 0.0–1.0)
Eosinophils Absolute: 0.1 10*3/uL (ref 0.0–0.6)
Eosinophils Relative: 1.7 % (ref 0.0–5.0)
HCT: 34.7 % — ABNORMAL LOW (ref 36.0–46.0)
Hemoglobin: 11.3 g/dL — ABNORMAL LOW (ref 12.0–15.0)
Lymphocytes Relative: 29 % (ref 12.0–46.0)
MCHC: 32.6 g/dL (ref 30.0–36.0)
MCV: 95.3 fL (ref 78.0–100.0)
Monocytes Absolute: 0.7 10*3/uL (ref 0.2–0.7)
Monocytes Relative: 8.4 % (ref 3.0–11.0)
Neutro Abs: 4.9 10*3/uL (ref 1.4–7.7)
Neutrophils Relative %: 60 % (ref 43.0–77.0)
Platelets: 301 10*3/uL (ref 150–400)
RBC: 3.64 M/uL — ABNORMAL LOW (ref 3.87–5.11)
RDW: 12.4 % (ref 11.5–14.6)
WBC: 8.1 10*3/uL (ref 4.5–10.5)

## 2008-02-24 ENCOUNTER — Telehealth: Payer: Self-pay | Admitting: Internal Medicine

## 2008-03-15 ENCOUNTER — Ambulatory Visit: Payer: Self-pay | Admitting: Oncology

## 2008-03-17 ENCOUNTER — Encounter: Payer: Self-pay | Admitting: Internal Medicine

## 2008-03-17 LAB — CBC & DIFF AND RETIC
BASO%: 0.7 % (ref 0.0–2.0)
Basophils Absolute: 0 10*3/uL (ref 0.0–0.1)
EOS%: 1.8 % (ref 0.0–7.0)
Eosinophils Absolute: 0.1 10*3/uL (ref 0.0–0.5)
HCT: 31.3 % — ABNORMAL LOW (ref 34.8–46.6)
HGB: 10.8 g/dL — ABNORMAL LOW (ref 11.6–15.9)
IRF: 0.23 (ref 0.130–0.330)
LYMPH%: 35.8 % (ref 14.0–48.0)
MCH: 31.2 pg (ref 26.0–34.0)
MCHC: 34.6 g/dL (ref 32.0–36.0)
MCV: 90.2 fL (ref 81.0–101.0)
MONO#: 0.5 10*3/uL (ref 0.1–0.9)
MONO%: 8 % (ref 0.0–13.0)
NEUT#: 3.5 10*3/uL (ref 1.5–6.5)
NEUT%: 53.7 % (ref 39.6–76.8)
Platelets: 243 10*3/uL (ref 145–400)
RBC: 3.47 10*6/uL — ABNORMAL LOW (ref 3.70–5.32)
RDW: 12.7 % (ref 11.3–14.5)
RETIC #: 20.8 10*3/uL (ref 19.7–115.1)
Retic %: 0.6 % (ref 0.4–2.3)
WBC: 6.6 10*3/uL (ref 3.9–10.0)
lymph#: 2.3 10*3/uL (ref 0.9–3.3)

## 2008-03-17 LAB — CHCC SMEAR

## 2008-03-17 LAB — FERRITIN: Ferritin: 34 ng/mL (ref 10–291)

## 2008-03-21 ENCOUNTER — Ambulatory Visit: Payer: Self-pay | Admitting: Internal Medicine

## 2008-03-31 ENCOUNTER — Encounter (INDEPENDENT_AMBULATORY_CARE_PROVIDER_SITE_OTHER): Payer: Self-pay | Admitting: *Deleted

## 2008-04-06 ENCOUNTER — Ambulatory Visit: Payer: Self-pay | Admitting: Gastroenterology

## 2008-04-26 ENCOUNTER — Ambulatory Visit: Payer: Self-pay | Admitting: Oncology

## 2008-04-27 ENCOUNTER — Encounter: Payer: Self-pay | Admitting: Internal Medicine

## 2008-04-27 ENCOUNTER — Ambulatory Visit: Payer: Self-pay | Admitting: Gastroenterology

## 2008-04-27 LAB — HM COLONOSCOPY

## 2008-04-28 ENCOUNTER — Encounter: Payer: Self-pay | Admitting: Internal Medicine

## 2008-04-28 LAB — CBC WITH DIFFERENTIAL/PLATELET
BASO%: 0.1 % (ref 0.0–2.0)
Basophils Absolute: 0 10*3/uL (ref 0.0–0.1)
EOS%: 2.9 % (ref 0.0–7.0)
Eosinophils Absolute: 0.2 10*3/uL (ref 0.0–0.5)
HCT: 30.8 % — ABNORMAL LOW (ref 34.8–46.6)
HGB: 10.5 g/dL — ABNORMAL LOW (ref 11.6–15.9)
LYMPH%: 42.2 % (ref 14.0–48.0)
MCH: 30.4 pg (ref 26.0–34.0)
MCHC: 34.2 g/dL (ref 32.0–36.0)
MCV: 89.1 fL (ref 81.0–101.0)
MONO#: 0.5 10*3/uL (ref 0.1–0.9)
MONO%: 7.8 % (ref 0.0–13.0)
NEUT#: 2.8 10*3/uL (ref 1.5–6.5)
NEUT%: 47 % (ref 39.6–76.8)
Platelets: 251 10*3/uL (ref 145–400)
RBC: 3.46 10*6/uL — ABNORMAL LOW (ref 3.70–5.32)
RDW: 13.1 % (ref 11.3–14.5)
WBC: 5.9 10*3/uL (ref 3.9–10.0)
lymph#: 2.5 10*3/uL (ref 0.9–3.3)

## 2008-05-19 ENCOUNTER — Ambulatory Visit: Payer: Self-pay | Admitting: Internal Medicine

## 2008-06-13 ENCOUNTER — Ambulatory Visit: Payer: Self-pay | Admitting: Oncology

## 2008-06-15 ENCOUNTER — Encounter: Payer: Self-pay | Admitting: Gastroenterology

## 2008-06-15 ENCOUNTER — Encounter: Payer: Self-pay | Admitting: Internal Medicine

## 2008-06-15 LAB — CBC WITH DIFFERENTIAL/PLATELET
BASO%: 0.6 % (ref 0.0–2.0)
Basophils Absolute: 0 10*3/uL (ref 0.0–0.1)
EOS%: 1.3 % (ref 0.0–7.0)
Eosinophils Absolute: 0.1 10*3/uL (ref 0.0–0.5)
HCT: 32.7 % — ABNORMAL LOW (ref 34.8–46.6)
HGB: 11.1 g/dL — ABNORMAL LOW (ref 11.6–15.9)
LYMPH%: 36.3 % (ref 14.0–48.0)
MCH: 29.9 pg (ref 26.0–34.0)
MCHC: 34 g/dL (ref 32.0–36.0)
MCV: 88 fL (ref 81.0–101.0)
MONO#: 0.5 10*3/uL (ref 0.1–0.9)
MONO%: 9.9 % (ref 0.0–13.0)
NEUT#: 2.6 10*3/uL (ref 1.5–6.5)
NEUT%: 51.9 % (ref 39.6–76.8)
Platelets: 259 10*3/uL (ref 145–400)
RBC: 3.72 10*6/uL (ref 3.70–5.32)
RDW: 13.2 % (ref 11.3–14.5)
WBC: 5 10*3/uL (ref 3.9–10.0)
lymph#: 1.8 10*3/uL (ref 0.9–3.3)

## 2008-06-15 LAB — CHCC SMEAR

## 2008-07-06 ENCOUNTER — Encounter: Admission: RE | Admit: 2008-07-06 | Discharge: 2008-07-06 | Payer: Self-pay | Admitting: Internal Medicine

## 2008-07-27 ENCOUNTER — Telehealth: Payer: Self-pay | Admitting: *Deleted

## 2008-08-02 ENCOUNTER — Encounter: Payer: Self-pay | Admitting: Internal Medicine

## 2008-08-02 ENCOUNTER — Encounter: Admission: RE | Admit: 2008-08-02 | Discharge: 2008-08-02 | Payer: Self-pay | Admitting: Neurosurgery

## 2008-08-03 ENCOUNTER — Encounter: Admission: RE | Admit: 2008-08-03 | Discharge: 2008-08-03 | Payer: Self-pay | Admitting: Neurosurgery

## 2008-08-15 ENCOUNTER — Ambulatory Visit: Payer: Self-pay | Admitting: Internal Medicine

## 2008-10-03 ENCOUNTER — Ambulatory Visit: Payer: Self-pay | Admitting: Internal Medicine

## 2008-11-13 ENCOUNTER — Ambulatory Visit: Payer: Self-pay | Admitting: Internal Medicine

## 2008-12-08 ENCOUNTER — Ambulatory Visit: Payer: Self-pay | Admitting: Oncology

## 2008-12-12 ENCOUNTER — Encounter: Payer: Self-pay | Admitting: Gastroenterology

## 2008-12-12 LAB — CHCC SMEAR

## 2008-12-12 LAB — CBC WITH DIFFERENTIAL/PLATELET
BASO%: 0.6 % (ref 0.0–2.0)
Basophils Absolute: 0 10*3/uL (ref 0.0–0.1)
EOS%: 1.9 % (ref 0.0–7.0)
Eosinophils Absolute: 0.1 10*3/uL (ref 0.0–0.5)
HCT: 31.7 % — ABNORMAL LOW (ref 34.8–46.6)
HGB: 10.6 g/dL — ABNORMAL LOW (ref 11.6–15.9)
LYMPH%: 24.8 % (ref 14.0–48.0)
MCH: 29.4 pg (ref 26.0–34.0)
MCHC: 33.5 g/dL (ref 32.0–36.0)
MCV: 87.7 fL (ref 81.0–101.0)
MONO#: 0.7 10*3/uL (ref 0.1–0.9)
MONO%: 8.9 % (ref 0.0–13.0)
NEUT#: 4.8 10*3/uL (ref 1.5–6.5)
NEUT%: 63.8 % (ref 39.6–76.8)
Platelets: 239 10*3/uL (ref 145–400)
RBC: 3.61 10*6/uL — ABNORMAL LOW (ref 3.70–5.32)
RDW: 13.7 % (ref 11.3–14.5)
WBC: 7.5 10*3/uL (ref 3.9–10.0)
lymph#: 1.9 10*3/uL (ref 0.9–3.3)

## 2008-12-12 LAB — FERRITIN: Ferritin: 13 ng/mL (ref 10–291)

## 2008-12-13 LAB — CEA: CEA: 1.3 ng/mL (ref 0.0–5.0)

## 2009-03-08 ENCOUNTER — Ambulatory Visit: Payer: Self-pay | Admitting: Oncology

## 2009-03-12 ENCOUNTER — Encounter: Payer: Self-pay | Admitting: Gastroenterology

## 2009-03-12 LAB — CBC WITH DIFFERENTIAL/PLATELET
BASO%: 0.8 % (ref 0.0–2.0)
Basophils Absolute: 0.1 10*3/uL (ref 0.0–0.1)
EOS%: 2.5 % (ref 0.0–7.0)
Eosinophils Absolute: 0.2 10*3/uL (ref 0.0–0.5)
HCT: 32.5 % — ABNORMAL LOW (ref 34.8–46.6)
HGB: 10.8 g/dL — ABNORMAL LOW (ref 11.6–15.9)
LYMPH%: 33.6 % (ref 14.0–49.7)
MCH: 28.6 pg (ref 25.1–34.0)
MCHC: 33.2 g/dL (ref 31.5–36.0)
MCV: 86 fL (ref 79.5–101.0)
MONO#: 0.6 10*3/uL (ref 0.1–0.9)
MONO%: 9.1 % (ref 0.0–14.0)
NEUT#: 3.6 10*3/uL (ref 1.5–6.5)
NEUT%: 54 % (ref 38.4–76.8)
Platelets: 244 10*3/uL (ref 145–400)
RBC: 3.78 10*6/uL (ref 3.70–5.45)
RDW: 14.5 % (ref 11.2–14.5)
WBC: 6.6 10*3/uL (ref 3.9–10.3)
lymph#: 2.2 10*3/uL (ref 0.9–3.3)

## 2009-03-12 LAB — FERRITIN: Ferritin: 10 ng/mL (ref 10–291)

## 2009-03-19 LAB — FECAL OCCULT BLOOD, GUAIAC: Occult Blood: NEGATIVE

## 2009-03-29 LAB — URINALYSIS, MICROSCOPIC - CHCC
Bilirubin (Urine): NEGATIVE
Blood: NEGATIVE
Glucose: NEGATIVE g/dL
Ketones: NEGATIVE mg/dL
Nitrite: NEGATIVE
Protein: NEGATIVE mg/dL
Specific Gravity, Urine: 1.015 (ref 1.003–1.035)
pH: 6 (ref 4.6–8.0)

## 2009-04-23 ENCOUNTER — Encounter: Payer: Self-pay | Admitting: Internal Medicine

## 2009-05-03 ENCOUNTER — Telehealth (INDEPENDENT_AMBULATORY_CARE_PROVIDER_SITE_OTHER): Payer: Self-pay | Admitting: *Deleted

## 2009-06-07 ENCOUNTER — Ambulatory Visit: Payer: Self-pay | Admitting: Oncology

## 2009-06-11 ENCOUNTER — Encounter: Payer: Self-pay | Admitting: Gastroenterology

## 2009-06-11 LAB — CBC WITH DIFFERENTIAL/PLATELET
BASO%: 0.9 % (ref 0.0–2.0)
Basophils Absolute: 0.1 10*3/uL (ref 0.0–0.1)
EOS%: 1.5 % (ref 0.0–7.0)
Eosinophils Absolute: 0.1 10*3/uL (ref 0.0–0.5)
HCT: 37.9 % (ref 34.8–46.6)
HGB: 13.2 g/dL (ref 11.6–15.9)
LYMPH%: 24.5 % (ref 14.0–49.7)
MCH: 31.7 pg (ref 25.1–34.0)
MCHC: 34.8 g/dL (ref 31.5–36.0)
MCV: 91.1 fL (ref 79.5–101.0)
MONO#: 0.6 10*3/uL (ref 0.1–0.9)
MONO%: 7.3 % (ref 0.0–14.0)
NEUT#: 5 10*3/uL (ref 1.5–6.5)
NEUT%: 65.8 % (ref 38.4–76.8)
Platelets: 262 10*3/uL (ref 145–400)
RBC: 4.16 10*6/uL (ref 3.70–5.45)
RDW: 14.3 % (ref 11.2–14.5)
WBC: 7.7 10*3/uL (ref 3.9–10.3)
lymph#: 1.9 10*3/uL (ref 0.9–3.3)

## 2009-06-11 LAB — CEA: CEA: 1.7 ng/mL (ref 0.0–5.0)

## 2009-06-11 LAB — FERRITIN: Ferritin: 40 ng/mL (ref 10–291)

## 2009-07-09 ENCOUNTER — Encounter: Admission: RE | Admit: 2009-07-09 | Discharge: 2009-07-09 | Payer: Self-pay | Admitting: Internal Medicine

## 2009-08-16 ENCOUNTER — Ambulatory Visit: Payer: Self-pay | Admitting: Oncology

## 2009-08-20 LAB — CEA: CEA: 1.6 ng/mL (ref 0.0–5.0)

## 2009-09-18 ENCOUNTER — Encounter: Payer: Self-pay | Admitting: Internal Medicine

## 2009-10-09 ENCOUNTER — Ambulatory Visit: Payer: Self-pay | Admitting: Oncology

## 2009-10-11 ENCOUNTER — Encounter: Payer: Self-pay | Admitting: Gastroenterology

## 2009-10-11 ENCOUNTER — Encounter: Payer: Self-pay | Admitting: Internal Medicine

## 2009-10-11 LAB — CEA: CEA: 1.7 ng/mL (ref 0.0–5.0)

## 2009-10-11 LAB — CBC WITH DIFFERENTIAL/PLATELET
BASO%: 0.5 % (ref 0.0–2.0)
Basophils Absolute: 0 10*3/uL (ref 0.0–0.1)
EOS%: 1.8 % (ref 0.0–7.0)
Eosinophils Absolute: 0.1 10*3/uL (ref 0.0–0.5)
HCT: 35.1 % (ref 34.8–46.6)
HGB: 11.8 g/dL (ref 11.6–15.9)
LYMPH%: 34.7 % (ref 14.0–49.7)
MCH: 31.4 pg (ref 25.1–34.0)
MCHC: 33.7 g/dL (ref 31.5–36.0)
MCV: 93.3 fL (ref 79.5–101.0)
MONO#: 0.5 10*3/uL (ref 0.1–0.9)
MONO%: 7.3 % (ref 0.0–14.0)
NEUT#: 3.6 10*3/uL (ref 1.5–6.5)
NEUT%: 55.7 % (ref 38.4–76.8)
Platelets: 196 10*3/uL (ref 145–400)
RBC: 3.77 10*6/uL (ref 3.70–5.45)
RDW: 13.1 % (ref 11.2–14.5)
WBC: 6.4 10*3/uL (ref 3.9–10.3)
lymph#: 2.2 10*3/uL (ref 0.9–3.3)

## 2009-10-11 LAB — FERRITIN: Ferritin: 37 ng/mL (ref 10–291)

## 2009-10-16 ENCOUNTER — Encounter (INDEPENDENT_AMBULATORY_CARE_PROVIDER_SITE_OTHER): Payer: Self-pay | Admitting: *Deleted

## 2009-10-19 ENCOUNTER — Ambulatory Visit: Payer: Self-pay | Admitting: Internal Medicine

## 2009-10-19 LAB — FECAL OCCULT BLOOD, GUAIAC: Occult Blood: NEGATIVE

## 2009-10-22 LAB — CONVERTED CEMR LAB: Vit D, 25-Hydroxy: 23 ng/mL — ABNORMAL LOW (ref 30–89)

## 2009-10-24 LAB — CONVERTED CEMR LAB
ALT: 16 units/L (ref 0–35)
AST: 26 units/L (ref 0–37)
Albumin: 3.5 g/dL (ref 3.5–5.2)
Alkaline Phosphatase: 50 units/L (ref 39–117)
BUN: 13 mg/dL (ref 6–23)
Bilirubin, Direct: 0.1 mg/dL (ref 0.0–0.3)
CO2: 32 meq/L (ref 19–32)
Calcium: 8.9 mg/dL (ref 8.4–10.5)
Chloride: 102 meq/L (ref 96–112)
Creatinine, Ser: 0.7 mg/dL (ref 0.4–1.2)
GFR calc non Af Amer: 84.24 mL/min (ref >60)
Glucose, Bld: 86 mg/dL (ref 70–99)
Potassium: 3.8 meq/L (ref 3.5–5.1)
Sodium: 140 meq/L (ref 135–145)
TSH: 4.53 microintl units/mL (ref 0.35–5.50)
Total Bilirubin: 0.6 mg/dL (ref 0.3–1.2)
Total Protein: 6.5 g/dL (ref 6.0–8.3)

## 2009-11-27 ENCOUNTER — Encounter (INDEPENDENT_AMBULATORY_CARE_PROVIDER_SITE_OTHER): Payer: Self-pay | Admitting: *Deleted

## 2010-01-21 ENCOUNTER — Ambulatory Visit: Payer: Self-pay | Admitting: Internal Medicine

## 2010-01-21 LAB — CONVERTED CEMR LAB: Vit D, 25-Hydroxy: 34 ng/mL (ref 30–89)

## 2010-02-04 ENCOUNTER — Ambulatory Visit: Payer: Self-pay | Admitting: Internal Medicine

## 2010-02-04 DIAGNOSIS — M545 Low back pain, unspecified: Secondary | ICD-10-CM

## 2010-02-04 HISTORY — DX: Low back pain, unspecified: M54.50

## 2010-02-13 ENCOUNTER — Ambulatory Visit: Payer: Self-pay | Admitting: Oncology

## 2010-02-14 ENCOUNTER — Encounter: Payer: Self-pay | Admitting: Gastroenterology

## 2010-02-14 LAB — CBC WITH DIFFERENTIAL/PLATELET
BASO%: 0.5 % (ref 0.0–2.0)
Basophils Absolute: 0 10*3/uL (ref 0.0–0.1)
EOS%: 1.4 % (ref 0.0–7.0)
Eosinophils Absolute: 0.1 10*3/uL (ref 0.0–0.5)
HCT: 36.8 % (ref 34.8–46.6)
HGB: 12.4 g/dL (ref 11.6–15.9)
LYMPH%: 33 % (ref 14.0–49.7)
MCH: 32.2 pg (ref 25.1–34.0)
MCHC: 33.8 g/dL (ref 31.5–36.0)
MCV: 95.4 fL (ref 79.5–101.0)
MONO#: 0.6 10*3/uL (ref 0.1–0.9)
MONO%: 10.4 % (ref 0.0–14.0)
NEUT#: 3.2 10*3/uL (ref 1.5–6.5)
NEUT%: 54.7 % (ref 38.4–76.8)
Platelets: 242 10*3/uL (ref 145–400)
RBC: 3.85 10*6/uL (ref 3.70–5.45)
RDW: 12.7 % (ref 11.2–14.5)
WBC: 5.9 10*3/uL (ref 3.9–10.3)
lymph#: 2 10*3/uL (ref 0.9–3.3)

## 2010-02-14 LAB — CEA: CEA: 2.2 ng/mL (ref 0.0–5.0)

## 2010-03-04 ENCOUNTER — Ambulatory Visit: Payer: Self-pay | Admitting: Internal Medicine

## 2010-03-19 ENCOUNTER — Encounter: Payer: Self-pay | Admitting: Internal Medicine

## 2010-03-26 ENCOUNTER — Ambulatory Visit: Payer: Self-pay | Admitting: Internal Medicine

## 2010-04-09 ENCOUNTER — Telehealth (INDEPENDENT_AMBULATORY_CARE_PROVIDER_SITE_OTHER): Payer: Self-pay

## 2010-04-17 ENCOUNTER — Encounter (INDEPENDENT_AMBULATORY_CARE_PROVIDER_SITE_OTHER): Payer: Self-pay | Admitting: *Deleted

## 2010-04-26 ENCOUNTER — Ambulatory Visit: Payer: Self-pay | Admitting: Internal Medicine

## 2010-05-17 ENCOUNTER — Telehealth: Payer: Self-pay | Admitting: Internal Medicine

## 2010-05-30 ENCOUNTER — Ambulatory Visit: Payer: Self-pay | Admitting: Gastroenterology

## 2010-05-30 DIAGNOSIS — K5730 Diverticulosis of large intestine without perforation or abscess without bleeding: Secondary | ICD-10-CM | POA: Insufficient documentation

## 2010-05-30 HISTORY — DX: Diverticulosis of large intestine without perforation or abscess without bleeding: K57.30

## 2010-06-14 ENCOUNTER — Encounter: Admission: RE | Admit: 2010-06-14 | Discharge: 2010-06-14 | Payer: Self-pay | Admitting: Gastroenterology

## 2010-06-18 ENCOUNTER — Telehealth: Payer: Self-pay | Admitting: Gastroenterology

## 2010-06-21 ENCOUNTER — Ambulatory Visit: Payer: Self-pay | Admitting: Internal Medicine

## 2010-06-21 DIAGNOSIS — I2510 Atherosclerotic heart disease of native coronary artery without angina pectoris: Secondary | ICD-10-CM | POA: Insufficient documentation

## 2010-06-25 LAB — CONVERTED CEMR LAB
ALT: 13 units/L (ref 0–35)
AST: 23 units/L (ref 0–37)
Albumin: 3.7 g/dL (ref 3.5–5.2)
Alkaline Phosphatase: 41 units/L (ref 39–117)
Basophils Absolute: 0 10*3/uL (ref 0.0–0.1)
Basophils Relative: 0.6 % (ref 0.0–3.0)
Bilirubin, Direct: 0.1 mg/dL (ref 0.0–0.3)
Cholesterol: 238 mg/dL — ABNORMAL HIGH (ref 0–200)
Direct LDL: 169.4 mg/dL
Eosinophils Absolute: 0.1 10*3/uL (ref 0.0–0.7)
Eosinophils Relative: 1.7 % (ref 0.0–5.0)
HCT: 35.5 % — ABNORMAL LOW (ref 36.0–46.0)
HDL: 57.5 mg/dL (ref >39.00)
Hemoglobin: 12.1 g/dL (ref 12.0–15.0)
Lymphocytes Relative: 41.7 % (ref 12.0–46.0)
Lymphs Abs: 2.3 10*3/uL (ref 0.7–4.0)
MCHC: 34.1 g/dL (ref 30.0–36.0)
MCV: 94.3 fL (ref 78.0–100.0)
Monocytes Absolute: 0.6 10*3/uL (ref 0.1–1.0)
Monocytes Relative: 11.3 % (ref 3.0–12.0)
Neutro Abs: 2.5 10*3/uL (ref 1.4–7.7)
Neutrophils Relative %: 44.7 % (ref 43.0–77.0)
Platelets: 208 10*3/uL (ref 150.0–400.0)
RBC: 3.76 M/uL — ABNORMAL LOW (ref 3.87–5.11)
RDW: 13.7 % (ref 11.5–14.6)
TSH: 4.11 microintl units/mL (ref 0.35–5.50)
Total Bilirubin: 0.5 mg/dL (ref 0.3–1.2)
Total CHOL/HDL Ratio: 4
Total Protein: 6.3 g/dL (ref 6.0–8.3)
Triglycerides: 85 mg/dL (ref 0.0–149.0)
VLDL: 17 mg/dL (ref 0.0–40.0)
WBC: 5.5 10*3/uL (ref 4.5–10.5)

## 2010-07-11 ENCOUNTER — Encounter: Admission: RE | Admit: 2010-07-11 | Discharge: 2010-07-11 | Payer: Self-pay | Admitting: Internal Medicine

## 2010-08-14 ENCOUNTER — Ambulatory Visit: Payer: Self-pay | Admitting: Oncology

## 2010-08-15 ENCOUNTER — Encounter: Payer: Self-pay | Admitting: Internal Medicine

## 2010-08-15 LAB — CBC WITH DIFFERENTIAL/PLATELET
BASO%: 0.7 % (ref 0.0–2.0)
Basophils Absolute: 0 10*3/uL (ref 0.0–0.1)
EOS%: 2.3 % (ref 0.0–7.0)
Eosinophils Absolute: 0.1 10*3/uL (ref 0.0–0.5)
HCT: 34.4 % — ABNORMAL LOW (ref 34.8–46.6)
HGB: 11.8 g/dL (ref 11.6–15.9)
LYMPH%: 38.6 % (ref 14.0–49.7)
MCH: 31.8 pg (ref 25.1–34.0)
MCHC: 34.3 g/dL (ref 31.5–36.0)
MCV: 92.8 fL (ref 79.5–101.0)
MONO#: 0.6 10*3/uL (ref 0.1–0.9)
MONO%: 10.8 % (ref 0.0–14.0)
NEUT#: 2.7 10*3/uL (ref 1.5–6.5)
NEUT%: 47.6 % (ref 38.4–76.8)
Platelets: 216 10*3/uL (ref 145–400)
RBC: 3.7 10*6/uL (ref 3.70–5.45)
RDW: 12.4 % (ref 11.2–14.5)
WBC: 5.6 10*3/uL (ref 3.9–10.3)
lymph#: 2.2 10*3/uL (ref 0.9–3.3)

## 2010-08-15 LAB — CEA: CEA: 2 ng/mL (ref 0.0–5.0)

## 2010-08-29 ENCOUNTER — Encounter: Payer: Self-pay | Admitting: Internal Medicine

## 2010-10-05 ENCOUNTER — Ambulatory Visit: Payer: Self-pay | Admitting: Family Medicine

## 2010-10-07 ENCOUNTER — Telehealth: Payer: Self-pay | Admitting: Internal Medicine

## 2010-11-19 ENCOUNTER — Ambulatory Visit: Payer: Self-pay | Admitting: Internal Medicine

## 2010-12-24 ENCOUNTER — Telehealth: Payer: Self-pay | Admitting: Family Medicine

## 2011-01-30 NOTE — Letter (Signed)
Summary: [facility]  [facility]   Imported By: Alphonsa Overall 03/30/2008 11:55:31  _____________________________________________________________________  External Attachment:    Type:   Image     Comment:   External Document

## 2011-01-30 NOTE — Letter (Signed)
Summary: [facility]  [facility]   Imported By: Esmeralda Links D'jimraou 09/21/2008 12:16:35  _____________________________________________________________________  External Attachment:    Type:   Image     Comment:   External Document

## 2011-01-30 NOTE — Consult Note (Signed)
Summary: Colon cancer   NAME:  [name], Barker           ACCOUNT NO.:  [id number]      MEDICAL RECORD NO.:  [id number]          PATIENT TYPE:  INP      LOCATION:  1322                         FACILITY:  [facility]      PHYSICIAN:  Leighton Roach. Truett Perna, M.D. DATE OF BIRTH:  [birth year]      DATE OF CONSULTATION:  05/13/2007   DATE OF DISCHARGE:  05/15/2007                                    CONSULTATION      .      REASON FOR CONSULTATION:  Colon cancer.      REFERRING PHYSICIAN:  Thornton Park. Daphine Deutscher, M.D.      HISTORY OF PRESENT ILLNESS:  [name] Barker is an 77 year old woman with   a history of colon cancer, status post surgery and chemotherapy in 1994.   On April 12, 2007 she underwent a colonoscopy procedure and was found to   have a 4 cm tumor in the ascending colon.  Biopsy showed a moderately   differentiated invasive colorectal type adenocarcinoma ([id number]).  CT   scan to the abdomen and pelvis on April 14, 2007 showed a 3 x 2 cm   lesion in the ascending colon just distal to the ileocecal valve; a few   small scattered mesenteric and retroperitoneal lymph nodes; there was no   evidence of hepatic metastatic disease.  Diverticulosis was noted   involving the sigmoid colon.      On May 11, 2007 she underwent a partial colectomy with primary   anastomosis by Dr. Daphine Deutscher.  Final pathology showed a 3 cm invasive   adenocarcinoma with mucinous features, moderately differentiated;   margins were negative; tumor invaded through the muscularis propria into   pericolonic adipose tissue; positive vascular/lymphatic invasion; 1/21   lymph nodes was positive.      PAST MEDICAL HISTORY:   1. Colon cancer 1994, status post surgery and chemotherapy.   2. Small bowel obstruction, February 2006, status post exploratory       laparotomy with lysis of adhesions.   3. GERD.   4. History of dyslipidemia.   5. History of osteoporosis.   6. Degenerative disk disease, status post L2-L3 decompressive         laminectomy/fusion.   7. Diverticulosis.   8. Chronic constipation.   9. Status post left total knee replacement.   10.Status post hysterectomy.   11.Status post appendectomy.   12.Status post rotator cuff surgery.      MEDICATIONS:   1. Heparin 5000 units every eight hours.   2. Dilaudid PCA.   3. Claritin 10 mg daily.   4. Macrobid 100 mg daily.   5. Lyrica 50 mg at bedtime.      ALLERGIES:   1. PENICILLIN.   2. MORPHINE.   3. PHENERGAN.      FAMILY HISTORY:  Mother deceased at age 99 of pancreas cancer.  Ms.   Danae Barker is the oldest of 42 children.      SOCIAL HISTORY:  [name] Barker lives in [place].  She is married.   She has six children.  A son has prostate cancer.  A daughter has   epilepsy.  She has no history of tobacco or ETOH use.      REVIEW OF SYSTEMS:  [name] Barker denies any weight loss or anorexia.   No fever or sweats.  Prior to surgery she was having occasional right   upper quadrant pain.  No shortness of breath or cough.  No recent change   in bowel habits.  She relays a history of chronic constipation.  No   hematochezia or melena.  No nausea or vomiting.  No hematuria or   dysuria.      PHYSICAL EXAMINATION:  VITAL SIGNS:  Temperature 98.1, heart rate 52,   respirations 16, blood pressure 149/70, oxygen saturation 96% on room   air.   GENERAL:  Pleasant, elderly female in no acute distress.   CHEST:  Lungs clear anteriorly.   CARDIOVASCULAR:  Regular rate and rhythm.   ABDOMEN:  Soft.  Midline incision with staples.  No organomegaly.   EXTREMITIES:  No edema.   NEUROLOGIC:  Alert and oriented.  Good __________  extremities.      LABORATORY DATA:  Hemoglobin 8.8, MCV 81, white count 14.2, platelet   count 254,000.  Sodium 134, potassium 4, BUN 7, creatinine 0.48, glucose   153, calcium 8.1.      Preoperative lab work on May 06, 2007:  Hemoglobin 9.5, MCV 82, white   count 4.8, platelet count 293,000.  Sodium 137, potassium 3.7, BUN 11,    creatinine 0.57, glucose 87, bilirubin 0.4, alkaline phosphatase 56,   SGOT 24, SGPT 10, total protein 6.7, albumin 3.4, calcium 8.7.      Lab work November 17, 2006:  CEA 1.5.      IMPRESSION AND PLAN:   1. Colon cancer (T3, N1), status post partial colectomy May 11, 2007.   2. History of colon cancer in 1994, status post surgery by Dr.       Marilynn Rail at [facility] and       chemotherapy with 5-FU/leucovorin/ Levamisole under the direction       of Dr. Mariel Sleet.   3. Microcytic anemia.   4. Degenerative disk disease, status post L2-L3 decompressive       laminectomy July 10, 2006.   5. Small bowel obstruction with lysis of adhesions, February 2006.      [name] Barker has been diagnosed with stage III colon cancer.  Dr.   Truett Perna reviewed the pathology report with [name] Barker and her   daughter.  The survival benefit associated with 5-FU based chemotherapy   was discussed.  Dr.   Truett Perna discussed Xeloda, and Fowlpox chemotherapy.  We will schedule   outpatient follow-up at the cancer center in 2-3 weeks.      The patient was interviewed and examined by Dr. Truett Perna; chart/plan   reviewed.               Lonna Cobb, N.P.               Leighton Roach. Truett Perna, M.D.   Electronically Signed         LT/MEDQ  D:  06/14/2007  T:  06/14/2007  Job:  [id number]      cc:   Venita Lick. Russella Dar, MD, FACG   [address]   [place]   [place] [address]      Valetta Mole. Swords, MD   [address] [place]   [place]  [address]      Thornton Park Daphine Deutscher, MD   [address]   [place]   [place] [address]

## 2011-01-30 NOTE — Procedures (Signed)
Summary: [facility]   Imported By: Sherian Rein 05/30/2010 15:51:31  _____________________________________________________________________  External Attachment:    Type:   Image     Comment:   External Document

## 2011-01-30 NOTE — Progress Notes (Signed)
Summary: chest xray and pain meds ASAP  Phone Note Call from Patient   Caller: Spouse Call For: Dr. Cato Mulligan Summary of Call: Pt's husband called requesting chest xray results and [phone]  ASAP today, please. Initial call taken by: Lynann Beaver CMA,  November 29, 2007 4:35 PM  Follow-up for Phone Call        awaiting cxr.  take advil 2 by mouth three times a day, also vicodin 1 by mouth q 4 hours as needed---they have some already. Follow-up by: Birdie Sons MD,  November 29, 2007 4:56 PM  Additional Follow-up for Phone Call Additional follow up Details #1::        Pt's husband notified. Additional Follow-up by: Lynann Beaver CMA,  November 29, 2007 5:06 PM

## 2011-01-30 NOTE — Assessment & Plan Note (Signed)
Summary: 1 month follow up/cjr   Vital Signs:  Patient profile:   77 year old female Weight:      135 pounds Temp:     98 degrees F oral Pulse rate:   64 / minute Pulse rhythm:   regular Resp:     12 per minute BP sitting:   122 / 70  (left arm) Cuff size:   regular  Vitals Entered By: Gladis Riffle, RN (April 26, 2010 10:08 AM) CC: 1 month rov--feeling weak, recovering from cold Is Patient Diabetic? No   CC:  1 month rov--feeling weak and recovering from cold.  History of Present Illness:  Follow-Up Visit      This is an 77 year old woman who presents for Follow-up visit.  The patient denies chest pain and palpitations.  Since the last visit the patient notes no new problems or concerns.  The patient reports taking meds as prescribed.  When questioned about possible medication side effects, the patient notes none.  back pain is stable---currently using no hydrocodone  All other systems reviewed and were negative   Preventive Screening-Counseling & Management  Alcohol-Tobacco     Smoking Status: never  Current Medications (verified): 1)  Actonel 35 Mg Tabs (Risedronate Sodium) .... One Weekly 2)  Aspir-81 81 Mg Tbec (Aspirin) .... Take 1 Tablet By Mouth Once A Day 3)  Claritin 10 Mg Tabs (Loratadine) .... Take 1 Tablet By Mouth Once A Day 4)  Prevacid 15 Mg Cpdr (Lansoprazole) .... Once Daily 5)  Fish Oil 1200 Mg  Caps (Omega-3 Fatty Acids) .... 2 Once Daily 6)  Vitamin D 1000 Unit Tabs (Cholecalciferol) .... Two Times A Day 7)  Stool Softener 100 Mg  Caps (Docusate Sodium) .... Two Two Times A Day 8)  Metamucil 30.9 %  Powd (Psyllium) .... One Dose Once Daily 9)  Senokot S 8.6-50 Mg Tabs (Sennosides-Docusate Sodium) .... Four Tablets Once Daily 10)  Muro 128 2 % Soln (Sodium Chloride (Hypertonic)) .... Qid 11)  Bepreve 1.5 % Soln (Bepotastine Besilate) .... Two Times A Day 12)  Calcium Citrate 600mg  .... Two Times A Day 13)  Ferrous Sulfate 325 (65 Fe) Mg Tabs (Ferrous  Sulfate) .... Once Daily 14)  Hydrocodone-Acetaminophen 7.5-500 Mg/65ml Soln (Hydrocodone-Acetaminophen) .Marland Kitchen.. 1 Tsp By Mouth Three Times A Day As Needed For Pain 15)  Proair Hfa 108 (90 Base) Mcg/act Aers (Albuterol Sulfate) .... Use Once A Day As Needed  Allergies: 1)  ! Methenamine Mandelate (Methenamine Mandelate) 2)  * Lidoderm Patch 3)  Morphine Sulfate (Morphine Sulfate) 4)  Penicillin G Potassium (Penicillin G Potassium) 5)  Phenergan (Promethazine Hcl) 6)  * Statin Drugs  Past History:  Past Medical History: Last updated: 11/18/2007 Anemia-NOS Colon cancer, hx of GERD Hyperlipidemia Osteopenia chronic back pain  Past Surgical History: Last updated: 02/21/2008 Appendectomy 1950's Colectomy, partial 1994 shoulder surgery 04/02/00 enterocoele knee arthroscopy [facility]--lysis of adhesion Back Surgery-2007 recurrent colon CA-2008 multiple ESI--back SBO-2009-did not require surgical intervention  Social History: Last updated: 02/09/2008 Married Never Smoked Regular exercise-no  Risk Factors: Exercise: no (02/09/2008)  Risk Factors: Smoking Status: never (04/26/2010)  Physical Exam  General:  alert and well-developed.   Head:  normocephalic and atraumatic.   Eyes:  pupils equal and pupils round.   Neck:  No deformities, masses, or tenderness noted. Lungs:  normal respiratory effort and no intercostal retractions.   Heart:  normal rate and regular rhythm.   Abdomen:  soft and non-tender.   Neurologic:  cranial  nerves II-XII intact.  uses cane   Impression & Recommendations:  Problem # 1:  LOW BACK PAIN, CHRONIC (ICD-724.2) using minimal meds had recent ESI---dr ramos Her updated medication list for this problem includes:    Aspir-81 81 Mg Tbec (Aspirin) .Marland Kitchen... Take 1 tablet by mouth once a day    Hydrocodone-acetaminophen 7.5-500 Mg/55ml Soln (Hydrocodone-acetaminophen) .Marland Kitchen... 1 tsp by mouth three times a day as needed for pain  Complete Medication  List: 1)  Actonel 35 Mg Tabs (Risedronate sodium) .... One weekly 2)  Aspir-81 81 Mg Tbec (Aspirin) .... Take 1 tablet by mouth once a day 3)  Claritin 10 Mg Tabs (Loratadine) .... Take 1 tablet by mouth once a day 4)  Prevacid 15 Mg Cpdr (Lansoprazole) .... Once daily 5)  Fish Oil 1200 Mg Caps (Omega-3 fatty acids) .... 2 once daily 6)  Vitamin D 1000 Unit Tabs (Cholecalciferol) .... Two times a day 7)  Stool Softener 100 Mg Caps (Docusate sodium) .... Two two times a day 8)  Metamucil 30.9 % Powd (Psyllium) .... One dose once daily 9)  Senokot S 8.6-50 Mg Tabs (Sennosides-docusate sodium) .... Four tablets once daily 10)  Muro 128 2 % Soln (Sodium chloride (hypertonic)) .... Qid 11)  Bepreve 1.5 % Soln (Bepotastine besilate) .... Two times a day 12)  Calcium Citrate 600mg   .... Two times a day 13)  Ferrous Sulfate 325 (65 Fe) Mg Tabs (Ferrous sulfate) .... Once daily 14)  Hydrocodone-acetaminophen 7.5-500 Mg/37ml Soln (Hydrocodone-acetaminophen) .Marland Kitchen.. 1 tsp by mouth three times a day as needed for pain 15)  Proair Hfa 108 (90 Base) Mcg/act Aers (Albuterol sulfate) .... Use once a day as needed  Patient Instructions: 1)  Please schedule a follow-up appointment in 2 months. Prescriptions: PROAIR HFA 108 (90 BASE) MCG/ACT AERS (ALBUTEROL SULFATE) use once a day as needed  #1 x 1   Entered by:   Gladis Riffle, RN   Authorized by:   Birdie Sons MD   Signed by:   Gladis Riffle, RN on 04/26/2010   Method used:   [facility]        [phone]       [address] [place], [place]  [address]       Ph: [phone] or [phone]       Fax: [phone]   RxID:   [id number]

## 2011-01-30 NOTE — Letter (Signed)
[facility]   Imported By: Kassie Mends 03/30/2008 11:20:18  _____________________________________________________________________  External Attachment:    Type:   Image     Comment:   [facility]

## 2011-01-30 NOTE — Letter (Signed)
Summary: [facility]  [facility]   Imported By: Sherian Rein 07/04/2009 11:44:30  _____________________________________________________________________  External Attachment:    Type:   Image     Comment:   External Document

## 2011-01-30 NOTE — Letter (Signed)
[facility]   Imported By: Lennie Odor 03/08/2010 14:05:46  _____________________________________________________________________  External Attachment:    Type:   Image     Comment:   External Document

## 2011-01-30 NOTE — Letter (Signed)
Summary: [facility]  [facility]   Imported By: Sherian Rein 09/06/2010 12:20:33  _____________________________________________________________________  External Attachment:    Type:   Image     Comment:   External Document

## 2011-01-30 NOTE — Assessment & Plan Note (Signed)
Summary: 2 MONTH ROV/NJR   Vital Signs:  Patient Profile:   77 Years Old Female Weight:      132 pounds Temp:     98.1 degrees F Pulse rate:   58 / minute Pulse rhythm:   skip BP sitting:   110 / 64  (left arm)  Vitals Entered By: Gladis Riffle, RN (May 19, 2008 1:36 PM)                 Chief Complaint:  2 month rov and has labs from cancer center--c/o no difference in lower back pain.  History of Present Illness:  Follow-Up Visit      This is an 77 year old woman who presents for Follow-up visit.  The patient denies chest pain, palpitations, dizziness, syncope, low blood sugar symptoms, high blood sugar symptoms, edema, SOB, DOE, PND, and orthopnea.  Since the last visit the patient notes no new problems or concerns.  The patient reports taking meds as prescribed.  When questioned about possible medication side effects, the patient notes none.    Past Medical History: Anemia-NOS Colon cancer, hx of GERD Hyperlipidemia Osteopenia chronic back pain Past Surgical History: Appendectomy 1950's Colectomy, partial 1994 shoulder surgery 04/02/00 enterocoele knee arthroscopy [facility]--lysis of adhesion Back Surgery-2007 recurrent colon CA-2008 multiple ESI--back [facility]-2009-did not require surgical intervention Current Meds:  ACTONEL 35 MG TABS (RISEDRONATE SODIUM) one weekly ASPIR-81 81 MG TBEC (ASPIRIN) Take 1 tablet by mouth once a day CLARITIN 10 MG TABS (LORATADINE) Take 1 tablet by mouth once a day PREVACID 30 MG CPDR (LANSOPRAZOLE) one daily CALTRATE 600+D 600-400 MG-UNIT  TABS (CALCIUM CARBONATE-VITAMIN D) two times a day PREMARIN 0.625 MG/GM  CREA (ESTROGENS, CONJUGATED) 3 X week FISH OIL 1200 MG  CAPS (OMEGA-3 FATTY ACIDS) 2 once daily MULTIVITAMINS   TABS (MULTIPLE VITAMIN) once daily NITROFURANTOIN MACROCRYSTAL 100 MG  CAPS (NITROFURANTOIN MACROCRYSTAL) Take 1 capsule by mouth once a day VITAMIN C 500 MG  TABS (ASCORBIC ACID) once daily VITAMIN D 400 UNIT  TABS  (CHOLECALCIFEROL) once daily STOOL SOFTENER 100 MG  CAPS (DOCUSATE SODIUM) once daily TYLENOL ARTHRITIS PAIN 650 MG  TBCR (ACETAMINOPHEN) every morning TYLENOL PM EXTRA STRENGTH 500-25 MG  TABS (DIPHENHYDRAMINE-APAP (SLEEP)) every PM  Social History: Married Never Smoked Regular exercise-no  Family History:      Current Allergies (reviewed today): * LIDODERM PATCH MORPHINE SULFATE (MORPHINE SULFATE) PENICILLIN G POTASSIUM (PENICILLIN G POTASSIUM) PHENERGAN (PROMETHAZINE HCL) * STATIN DRUGS     Review of Systems       some trouble sleeping the whole night chronic back pain   Physical Exam  General:     Well-developed,well-nourished,in no acute distress; alert,appropriate and cooperative throughout examination Head:     normocephalic and atraumatic.   Eyes:     pupils equal and pupils round.   Ears:     R ear normal and L ear normal.   Neck:     No deformities, masses, or tenderness noted. Chest Wall:     No deformities, masses, or tenderness noted. Lungs:     Normal respiratory effort, chest expands symmetrically. Lungs are clear to auscultation, no crackles or wheezes. Heart:     Normal rate and regular rhythm. S1 and S2 normal without gallop, murmur, click, rub or other extra sounds. Abdomen:     Bowel sounds positive,abdomen soft and non-tender without masses, organomegaly or hernias noted. Msk:     No deformity or scoliosis noted of thoracic or lumbar spine.   Pulses:  R radial normal and L radial normal.      Impression & Recommendations:  Problem # 1:  SMALL BOWEL OBSTRUCTION (ICD-560.9) no recurrence  Problem # 2:  ANEMIA-NOS (ICD-285.9) likely related to multiple medical problems - not   Problem # 3:  UTI'S, RECURRENT (ICD-599.0) chronic ABX Her updated medication list for this problem includes:    Nitrofurantoin Macrocrystal 100 Mg Caps (Nitrofurantoin macrocrystal) .Marland Kitchen... Take 1 capsule by mouth once a day   Problem # 4:   ADENOCARCINOMA, COLON (ICD-153.9) reviewed colonoscopy 04/27/08  Problem # 5:  BACK PAIN (ICD-724.5) chronic ok to use up to 4 extra strength tylenol at night.  Her updated medication list for this problem includes:    Aspir-81 81 Mg Tbec (Aspirin) .Marland Kitchen... Take 1 tablet by mouth once a day    Tylenol Arthritis Pain 650 Mg Tbcr (Acetaminophen) ..... Every morning   Complete Medication List: 1)  Actonel 35 Mg Tabs (Risedronate sodium) .... One weekly 2)  Aspir-81 81 Mg Tbec (Aspirin) .... Take 1 tablet by mouth once a day 3)  Claritin 10 Mg Tabs (Loratadine) .... Take 1 tablet by mouth once a day 4)  Prevacid 30 Mg Cpdr (Lansoprazole) .... One daily 5)  Caltrate 600+d 600-400 Mg-unit Tabs (Calcium carbonate-vitamin d) .... Two times a day 6)  Premarin 0.625 Mg/gm Crea (Estrogens, conjugated) .... 3 x week 7)  Fish Oil 1200 Mg Caps (Omega-3 fatty acids) .... 2 once daily 8)  Multivitamins Tabs (Multiple vitamin) .... Once daily 9)  Nitrofurantoin Macrocrystal 100 Mg Caps (Nitrofurantoin macrocrystal) .... Take 1 capsule by mouth once a day 10)  Vitamin C 500 Mg Tabs (Ascorbic acid) .... Once daily 11)  Vitamin D 400 Unit Tabs (Cholecalciferol) .... Once daily 12)  Stool Softener 100 Mg Caps (Docusate sodium) .... Once daily 13)  Tylenol Arthritis Pain 650 Mg Tbcr (Acetaminophen) .... Every morning 14)  Tylenol Pm Extra Strength 500-25 Mg Tabs (Diphenhydramine-apap (sleep)) .... Every pm   Patient Instructions: 1)  Please schedule a follow-up appointment in 3 months.   ]

## 2011-01-30 NOTE — Progress Notes (Signed)
Summary: RX refill  Phone Note Call from Patient   Caller: Spouse Reason for Call: Refill Medication Summary of Call: RX RF ACTONEL 35 MG 4 TAB 1 TAB ONCE A WEEK PLS CALL IN PH[facility] Initial call taken by: Shan Levans,  August 31, 2007 11:49 AM  Follow-up for Phone Call        ok for one year Follow-up by: Birdie Sons MD,  August 31, 2007 4:21 PM  Additional Follow-up for Phone Call Additional follow up Details #1::        Rx sent electronically, pt informed Additional Follow-up by: Sid Falcon LPN,  August 31, 2007 4:30 PM      Prescriptions: ACTONEL 35 MG TABS (RISEDRONATE SODIUM) one weekly  #4 x 11   Entered by:   Sid Falcon LPN   Authorized by:   Birdie Sons MD   Signed by:   Sid Falcon LPN on 95/28/4132   Method used:   Electronically sent to ...       [facility]  [phone]*       [address]       [place], [place]  [address]       Ph: [phone] or [phone]       Fax: [phone]   RxID:   [id number]

## 2011-01-30 NOTE — Op Note (Signed)
Summary: Open partial colectomy   NAME:  [name], Barker           ACCOUNT NO.:  [id number]      MEDICAL RECORD NO.:  [id number]          PATIENT TYPE:  INP      LOCATION:  X004                         FACILITY:  [facility]      PHYSICIAN:  Thornton Park. Daphine Deutscher, MD  DATE OF BIRTH:  04-Aug-1921      DATE OF PROCEDURE:  05/11/2007   DATE OF DISCHARGE:                                  OPERATIVE REPORT      PREOPERATIVE DIAGNOSIS:  Cancer in a transverse colon remnant.      POSTOPERATIVE DIAGNOSIS:  Cancer in a transverse colon remnant.      PROCEDURE:  Open partial colectomy with primary anastomosis.      SURGEON:  Thornton Park. Daphine Deutscher, MD      ASSISTANT:  Sandria Bales. Ezzard Standing, M.D.      ANESTHESIA:  General endotracheal.      ESTIMATED BLOOD LOSS:  Minimal.      DESCRIPTION OF PROCEDURE:  The patient is an 77 year old white female   who had a right hemicolectomy at [facility] in 1994 followed by   chemotherapy by Ladona Horns. Neijstrom, MD.  She had a bowel obstruction in   February 2006 managed operatively by Dr. Orson Slick requiring short segment   small intestine to be resected.  Recently she had a colonoscopy by Dr.   Russella Dar and found this tumor in her transverse colon.      She was seen in the office, evaluated preop informed consent was   obtained.  She was taken to OR 6 given general anesthesia.  Abdomen was   prepped with Techni-Care and draped sterilely.  I made an upper midline   incision excising her old scar entering her abdomen without difficulty.   I did enterolysis taking down the omentum which was stuck up to the   anterior abdominal wall.  I divided it distally so that I could reflect   the transverse colon and identified the previous ileotransverse   connection.  The tumor was actually right at that connection I could   feel it.  Feel to be about golf ball sized mass.  There no palpable   lymph nodes that I could feel.  We then divided bowel proximally   dividing the small  bowel with a GIA and distal to the tumor to get a   good margin.  I then went to the mesentery with the LigaSure and then   resected specimen.  A functional end-to-end anastomosis was done using a   GIA laying the bowel up side-to-side and over creating openings and   firing it.  I inspected the staple line.  It was not bleeding.  The   common defect was then closed in two layers 4-0 PDS and 3-0 silk.  The   mesenteric defect was closed with 2-0 silk.  We changed our gloves.   There was essentially no spillage.  We irrigated well.  Everything else   was in order and we closed the abdomen with #1 Novofils from above and   below irrigating the  wound, injected the   fascia with some Marcaine and closed the skin with staples.  The patient   seemed to tolerate the procedure well was taken to recovery room in   satisfactory condition.      FINAL DIAGNOSIS:  Carcinoma of the proximal transverse colon pathology   pending.               Thornton Park Daphine Deutscher, MD   Electronically Signed            MBM/MEDQ  D:  05/11/2007  T:  05/11/2007  Job:  [id number]      cc:   Valetta Mole. Swords, MD   [address] [place]   [place] [address]      Venita Lick. Russella Dar, MD, FACG   [address]   [place]   [place] [address]

## 2011-01-30 NOTE — Progress Notes (Signed)
Summary: refill  Phone Note Call from Patient Call back at [facility] Phone [phone]   Caller: Patient Call For: dr swords Summary of Call: pt need new rx azmacort last refill 2006 [facility] [phone] Initial call taken by: Heron Sabins,  May 03, 2009 12:43 PM  Follow-up for Phone Call        Patient is going [place] and has trouble with her Asthma. She would like a refill for her Azmacort just in case. Follow-up by: Darra Lis RMA,  May 03, 2009 1:07 PM  Additional Follow-up for Phone Call Additional follow up Details #1::        ok Additional Follow-up by: Birdie Sons MD,  May 03, 2009 3:23 PM    Additional Follow-up for Phone Call Additional follow up Details #2::    Patient aware. Follow-up by: Darra Lis RMA,  May 03, 2009 3:35 PM  New/Updated Medications: AZMACORT 75 MCG/ACT AERS (TRIAMCINOLONE ACETONIDE) Use as directed.   Prescriptions: AZMACORT 75 MCG/ACT AERS (TRIAMCINOLONE ACETONIDE) Use as directed.  #1 x 3   Entered by:   Darra Lis RMA   Authorized by:   Birdie Sons MD   Signed by:   Darra Lis RMA on 05/03/2009   Method used:   Electronically to        [facility]  [phone]* (retail)       [address] [place], [place]  [address]       Ph: [phone] or [phone]       Fax: [phone]   RxID:   [id number]

## 2011-01-30 NOTE — Procedures (Signed)
Summary: Colonoscopy   Colonoscopy  Procedure date:  02/03/2001  Findings:      Results: Diverticulosis.       Location:  [facility].    Procedures Next Due Date:    Colonoscopy: 01/2004  Colonoscopy  Procedure date:  02/03/2001  Findings:      Results: Diverticulosis.       Location:  [facility].    Procedures Next Due Date:    Colonoscopy: 01/2004 Patient Name: [name] Barker, [name] Barker MRN:  Procedure Procedures: Colonoscopy CPT: 04540.  Personnel: Endoscopist: Venita Lick. Russella Dar, MD, Clementeen Graham.  Referred By: Ladona Horns. Mariel Sleet, MD.  Exam Location: Exam performed in Outpatient Clinic. Outpatient  Patient Consent: Procedure, Alternatives, Risks and Benefits discussed, consent obtained, from patient. Consent to be contacted was not given.  Indications  Surveillance of: Colorectal Cancer. The patient has had prior cancer staging. Index tumor removed in 1994, month of Nov. Index cancerous lesion was  located in proximal (splenic flexure and beyond) colon. 1-2 tumors were found at the index exam. Pathology of worst  tumor: adenocarcinoma.  History  Pre-Exam Physical: Performed Feb 03, 2001. Cardio-pulmonary exam, Rectal exam, HEENT exam , Abdominal exam, Extremity exam, Neurological exam, Mental status exam WNL.  Exam Exam: Extent of exam reached: Ileum, extent intended: Ileum.  Patient position: on left side. Colon retroflexion performed. Images taken. ASA Classification: II. Tolerance: good.  Monitoring: Pulse and BP monitoring, Oximetry used. Supplemental O2 given.  Colon Prep Used Golytely for colon prep. Dose Used: 4L. Prep results: good.  Sedation Meds: Fentanyl 100 mcg. Versed 9 mg.  Instrument(s): PCF 140L. Serial [id number]. Serial #0.  Findings - PRIOR SURGERY: Right Hemicolectomy. Anastamosis present.  DIVERTICULOSIS: Sigmoid Colon to Transverse Colon. Not bleeding. ICD9: Diverticulosis: 562.10.  NORMAL EXAM: Rectum.     Comments: Moderately difficult procedure, tortuous colon. Assessment Abnormal examination, see findings above.  Diagnoses: 562.10: Diverticulosis.   Events  Unplanned Interventions: No intervention was required.  Unplanned Events: There were no complications. Plans Medication Plan: Continue current medications.  Patient Education: Patient given standard instructions for: Diverticulosis.  Disposition: After procedure patient sent to recovery. After recovery patient sent home.  Scheduling/Referral: Colonoscopy, to [facility] T. Russella Dar, MD, [facility], around Feb 04, 2004.  Referring provider, to [facility]. Mariel Sleet, MD, Feb 04, 2001.  Primary Care Provider, to Bruce H. Swords, MD, Feb 04, 2001.    This report was created from the original endoscopy report, which was reviewed and signed by the above listed endoscopist.    cc: Ladona Horns. Mariel Sleet, MD

## 2011-01-30 NOTE — Letter (Signed)
[facility]   Imported By: Kassie Mends 01/05/2008 10:14:15  _____________________________________________________________________  External Attachment:    Type:   Image     Comment:   [facility]

## 2011-01-30 NOTE — Assessment & Plan Note (Signed)
    History of Present Illness: She complains of bilateral lower quadrant pain. Note hx of colon CA and lysis of adhesion.  Sxs ongoing for several months. No change in BMS no N/V  Chronic back pain is some better.  She needs pneumovacc .  Past Medical History:    Anemia-NOS    Colon cancer, hx of    GERD    Hyperlipidemia    Osteopenia  Past Surgical History:    Appendectomy    Colectomy    shoulder surgery    enterocoele    knee arthroscopy    SBO--lysis of adhesion    Back Surgery-2007  Social History:    Married    Never Smoked  Risk Factors:  Tobacco use:  never  Review of Systems       The patient complains of abdominal pain.  The patient denies fever, chills, sweats, anorexia, fatigue, weakness, malaise, weight loss, sleep disorder, blurring, diplopia, eye irritation, eye discharge, vision loss, eye pain, photophobia, earache, ear discharge, tinnitus, decreased hearing, nasal congestion, nosebleeds, sore throat, hoarseness, chest pain, palpitations, syncope, dyspnea on exertion, orthopnea, PND, peripheral edema, cough, dyspnea at rest, excessive sputum, hemoptysis, wheezing, pleurisy, nausea, vomiting, diarrhea, constipation, change in bowel habits, melena, hematochezia, jaundice, gas/bloating, indigestion/heartburn, dysphagia, odynophagia, dysuria, hematuria, urinary frequency, urinary hesitancy, nocturia, incontinence, genital sores, decreased libido, erectile dysfunction, back pain, joint pain, joint swelling, muscle cramps, muscle weakness, stiffness, arthritis, sciatica, restless legs, leg pain at night, leg pain with exertion, rash, itching, dryness, suspicious lesions, paralysis, paresthesias, seizures, tremors, vertigo, transient blindness, frequent falls, frequent headaches, difficulty walking, depression, anxiety, memory loss, suicidal ideation, hallucinations, paranoia, phobia, confusion, cold intolerance, heat intolerance, polydipsia, polyphagia, polyuria,  unusual weight change, abnormal bruising, bleeding, enlarged lymph nodes, urticaria, allergic rash, hay fever, and recurrent infections.    Physical Exam  General:     Well-developed,well-nourished,in no acute distress; alert,appropriate and cooperative throughout examination  See vitals and med sheet Head:     Normocephalic and atraumatic without obvious abnormalities. No apparent alopecia or balding. Mouth:     Oral mucosa and oropharynx without lesions or exudates.  Teeth in good repair. Neck:     No deformities, masses, or tenderness noted. Chest Wall:     No deformities, masses, or tenderness noted. Lungs:     Normal respiratory effort, chest expands symmetrically. Lungs are clear to auscultation, no crackles or wheezes. Heart:     Normal rate and regular rhythm. S1 and S2 normal without gallop, murmur, click, rub or other extra sounds. Abdomen:     Bowel sounds positive,abdomen soft and non-tender without masses, organomegaly or hernias noted. Msk:     No deformity or scoliosis noted of thoracic or lumbar spine.   Neurologic:     No cranial nerve deficits noted. Station and gait are normal. Plantar reflexes are down-going bilaterally. DTRs are symmetrical throughout. Sensory, motor and coordinative functions appear intact. Skin:     Intact without suspicious lesions or rashes   Impression & Recommendations:  Problem # 1:  ABDOMINAL PAIN (ICD-789.00) I am not very concerned about recurrent cancer. She has had recent surgical explanation of her abdomen @ time of lyrics of adhesion. May need to see Dr. Russella Dar LFTs, BMET, CBCdiff, CEA  Problem # 2:  COLON CANCER, HX OF (ICD-V10.05) CEA  Problem # 3:  ANEMIA-NOS (ICD-285.9) CBC diff She will need regular followup of CBC  Problem # 4:  GERD (ICD-530.81) SXs well controlled currently not on any meds

## 2011-01-30 NOTE — Letter (Signed)
Summary[facility]  [facility]   Imported By: Lester Bellflower 01/06/2009 11:48:01  _____________________________________________________________________  External Attachment:    Type:   Image     Comment:   External Document

## 2011-01-30 NOTE — Letter (Signed)
[facility]   Imported By: Kassie Mends 12/14/2007 10:11:54  _____________________________________________________________________  External Attachment:    Type:   Image     Comment:   [facility]

## 2011-01-30 NOTE — Letter (Signed)
Summary: [facility]  [facility]   Imported By: Kassie Mends 09/27/2007 10:46:34  _____________________________________________________________________  External Attachment:    Type:   Image     Comment:   [facility] note

## 2011-01-30 NOTE — Discharge Summary (Signed)
Summary: Adenocarcinoma of the transverse colon  NAME:  [name] Barker, [name] Barker           ACCOUNT NO.:  [id number]      MEDICAL RECORD NO.:  [id number]          PATIENT TYPE:  INP      LOCATION:  1322                         FACILITY:  [facility]      PHYSICIAN:  Thornton Park. Daphine Deutscher, MD  DATE OF BIRTH:  02-Feb-1921      DATE OF ADMISSION:  05/11/2007   DATE OF DISCHARGE:  05/15/2007                                  DISCHARGE SUMMARY      ADMISSION DIAGNOSIS:  Adenocarcinoma in the transverse colon.      DISCHARGE DIAGNOSIS:  Ileocolonic segment with invasive colonic   adenocarcinoma with negative surgical margins of resection and with 1   metastatic lymph node out of 21 (T3, N1, MX).      COURSE IN THE HOSPITAL:  [name] came in and underwent partial   colectomy with primary anastomosis.  She did well and started passing   gas and taking liquids and was ready for discharge on the above-   mentioned day.  She was seen in the hospital by Dr. Mancel Bale.  She   went home on postoperative day #4 to the care of her husband.  They   seemed very comfortable managing her postoperatively.      FINAL DIAGNOSIS:  Adenocarcinoma of the transverse colon.               Thornton Park Daphine Deutscher, MD   Electronically Signed            MBM/MEDQ  D:  06/09/2007  T:  06/10/2007  Job:  [id number]      cc:   Leighton Roach. Truett Perna, M.D.   Fax: [phone]

## 2011-01-30 NOTE — Letter (Signed)
Summary: [facility]  [facility]   Imported By: Sherian Rein 05/30/2010 15:44:36  _____________________________________________________________________  External Attachment:    Type:   Image     Comment:   External Document

## 2011-01-30 NOTE — Procedures (Signed)
Summary: Colonoscopy  Colonoscopy/[facility]   Imported By: Lanelle Bal 05/29/2008 12:18:24  _____________________________________________________________________  External Attachment:    Type:   Image     Comment:   External Document

## 2011-01-30 NOTE — Letter (Signed)
Summary: [facility]  [facility]   Imported By: Maryln Gottron 09/10/2010 15:30:11  _____________________________________________________________________  External Attachment:    Type:   Image     Comment:   External Document

## 2011-01-30 NOTE — Letter (Signed)
Summary: [facility]  [facility] Indya Oliveria: This Dimitrious Micciche was preselected by the workflow.  Signature: The signature status of this document was preset by the workflow  Processed by [employer] Local Indexer Client @ Wednesday, December 12, 2009 11:23:53 AM using version:2010.1.2.11(2.4)   Manually Indexed By: [id number]  idlBatchDetail: [id number]   _____________________________________________________________________  External Attachment:    Type:   Image     Comment:   External Document

## 2011-01-30 NOTE — Assessment & Plan Note (Signed)
Summary: e mos ov on cancer drug doesn't sleep   Vital Signs:  Patient Profile:   77 Years Old Female Weight:      130 pounds Temp:     97.8 degrees F oral Pulse rate:   70 / minute Pulse rhythm:   irregular BP sitting:   100 / 64  (left arm)  Vitals Entered By: Gladis Riffle, RN (November 18, 2007 10:59 AM)                 Chief Complaint:  rov--states not sleeping well on cancer drug--BP her machine 119/66 today.  History of Present Illness:  Follow-Up Visit: GERD, colon ca (on xeloda), lipids, osteoporosis (tolerating meds without dificulty).       This is an 77 year old woman who presents for Follow-up visit.  The patient denies chest pain, palpitations, dizziness, syncope, low blood sugar symptoms, high blood sugar symptoms, edema, SOB, DOE, PND, and orthopnea.  Since the last visit the patient notes no new problems or concerns.  The patient reports taking meds as prescribed.  When questioned about possible medication side effects, the patient notes none.    Current Allergies (reviewed today): * LIDODERM PATCH MORPHINE SULFATE (MORPHINE SULFATE) PENICILLIN G POTASSIUM (PENICILLIN G POTASSIUM) PHENERGAN (PROMETHAZINE HCL) * STATIN DRUGS  Past Medical History:    Reviewed history from 11/17/2006 and no changes required:       Anemia-NOS       Colon cancer, hx of       GERD       Hyperlipidemia       Osteopenia       chronic back pain  Past Surgical History:    Appendectomy 1950's    Colectomy, partial 1994    shoulder surgery 04/02/00    enterocoele    knee arthroscopy    SBO--lysis of adhesion    Back Surgery-2007    recurrent colon CA-2008    multiple ESI--back   Social History:    Reviewed history from 11/17/2006 and no changes required:       Married       Never Smoked    Review of Systems       no other complaints in a complete ROS except for those listed in HPI   Physical Exam  General:     Well-developed,well-nourished,in no acute distress;  alert,appropriate and cooperative throughout examination Head:     normocephalic, atraumatic, and no abnormalities observed.   Eyes:     pupils equal and pupils round.   Ears:     no external deformities.   Nose:     no external deformity.   Neck:     No deformities, masses, or tenderness noted. Chest Wall:     No deformities, masses, or tenderness noted. Lungs:     Normal respiratory effort, chest expands symmetrically. Lungs are clear to auscultation, no crackles or wheezes. Heart:     irregular rhythm.  - presumed pvc every 5-6 beats.  Abdomen:     Bowel sounds positive,abdomen soft and non-tender without masses, organomegaly or hernias noted. Msk:     No deformity or scoliosis noted of thoracic or lumbar spine.   Neurologic:     No cranial nerve deficits noted. Station and gait are normal. Sensory, motor and coordinative functions appear intact.    Impression & Recommendations:  Problem # 1:  ADENOCARCINOMA, COLON (ICD-153.9) xeloda  Problem # 2:  ANEMIA, CHRONIC (ICD-281.9) Hgb: 10.6 (11/17/2006)   Hct: 32.3 (  11/17/2006)   RDW: 13.5 (11/17/2006)   MCV: 86.9 (11/17/2006)   MCHC: 32.8 (11/17/2006)   Problem # 3:  BACK PAIN (ICD-724.5) generally well controlled Her updated medication list for this problem includes:    Aspir-81 81 Mg Tbec (Aspirin) .Marland Kitchen... Take 1 tablet by mouth once a day    Vicodin 5-500 Mg Tabs (Hydrocodone-acetaminophen) .Marland Kitchen... 1/2-1 tablets two times a day  Orders: UA Dipstick w/o Micro (81002)   Problem # 4:  OSTEOPENIA (ICD-733.90) continue current medications Her updated medication list for this problem includes:    Actonel 35 Mg Tabs (Risedronate sodium) ..... One weekly    Caltrate 600+d 600-400 Mg-unit Tabs (Calcium carbonate-vitamin d) .Marland Kitchen..Marland Kitchen Two times a day   Problem # 5:  HYPERLIPIDEMIA (ICD-272.4) Assessment: Comment Only she will need labs next visit Labs Reviewed: SGOT: 26 (11/17/2006)   SGPT: 14 (11/17/2006)  Lipid Goals: Chol  Goal: 200 (12/08/2006)   HDL Goal: 40 (12/08/2006)   LDL Goal: 130 (12/08/2006)   TG Goal: 150 (12/08/2006)  Prior 10 Yr Risk Heart Disease: Not enough information (12/08/2006)   Complete Medication List: 1)  Actonel 35 Mg Tabs (Risedronate sodium) .... One weekly 2)  Aspir-81 81 Mg Tbec (Aspirin) .... Take 1 tablet by mouth once a day 3)  Claritin 10 Mg Tabs (Loratadine) .... Take 1 tablet by mouth once a day 4)  Lyrica 50 Mg Caps (Pregabalin) .... Take 1 capsule by mouth twice a day 5)  Nitrofurantoin Monohyd Macro 100 Mg Caps (Nitrofurantoin monohyd macro) .Marland Kitchen.. 1 once a day 6)  Prevacid 30 Mg Cpdr (Lansoprazole) .... One daily 7)  Vicodin 5-500 Mg Tabs (Hydrocodone-acetaminophen) .... 1/2-1 tablets two times a day 8)  Xeloda 500 Mg Tabs (Capecitabine) .... Two two times a day 9)  Caltrate 600+d 600-400 Mg-unit Tabs (Calcium carbonate-vitamin d) .... Two times a day 10)  Premarin 0.625 Mg/gm Crea (Estrogens, conjugated) .... 3 x week 11)  Fish Oil 1200 Mg Caps (Omega-3 fatty acids) .... 2 once daily 12)  Vitamin B-6 50 Mg Tabs (Pyridoxine hcl) .... Once daily   Patient Instructions: 1)  Please schedule a follow-up appointment in 4 months.    ] Laboratory Results   Urine Tests   Date/Time Reported: November 18, 2007 2:52 PM   Routine Urinalysis   Color: yellow Appearance: Clear Glucose: negative   (Normal Range: Negative) Bilirubin: negative   (Normal Range: Negative) Ketone: negative   (Normal Range: Negative) Spec. Gravity: 1.020   (Normal Range: 1.003-1.035) Blood: negative   (Normal Range: Negative) pH: 7.0   (Normal Range: 5.0-8.0) Protein: negative   (Normal Range: Negative) Urobilinogen: 0.2   (Normal Range: 0-1) Nitrite: negative   (Normal Range: Negative) Leukocyte Esterace: negative   (Normal Range: Negative)    Comments: ..................................................................Marland KitchenWynona Canes, CMA  November 18, 2007 2:52 PM

## 2011-01-30 NOTE — Letter (Signed)
Summary: New Patient letter  [facility]  [address] [place], [place] [address]   Phone: [phone]  Fax: [phone]       04/17/2010 MRN: [id number]  [name] Barker [address] [place], [place]  [address]  Dear [name] Barker,  Welcome to the Gastroenterology Division at [facility].    You are scheduled to see Dr.  Russella Dar on 05/30/2010 at 9:30AM on the 3rd floor at [facility], [address]  We ask that you try to arrive at our office 15 minutes prior to your appointment time to allow for check-in.  We would like you to complete the enclosed self-administered evaluation form prior to your visit and bring it with you on the day of your appointment.  We will review it with you.  Also, please bring a complete list of all your medications or, if you prefer, bring the medication bottles and we will list them.  Please bring your insurance card so that we may make a copy of it.  If your insurance requires a referral to see a specialist, please bring your referral form from your primary care physician.  Co-payments are due at the time of your visit and may be paid by cash, check or credit card.     Your office visit will consist of a consult with your physician (includes a physical exam), any laboratory testing he/she may order, scheduling of any necessary diagnostic testing (e.g. x-ray, ultrasound, CT-scan), and scheduling of a procedure (e.g. Endoscopy, Colonoscopy) if required.  Please allow enough time on your schedule to allow for any/all of these possibilities.    If you cannot keep your appointment, please call [phone] to cancel or reschedule prior to your appointment date.  This allows [place] the opportunity to schedule an appointment for another patient in need of care.  If you do not cancel or reschedule by 5 p.m. the business day prior to your appointment date, you will be charged a $50.00 late cancellation/no-show fee.    Thank you for  choosing [facility] for your medical needs.  We appreciate the opportunity to care for you.  Please visit [place] at our website  to learn more about our practice.                     Sincerely,                                                             The Gastroenterology Division

## 2011-01-30 NOTE — Op Note (Signed)
Summary: L4-5, L5-S1 Facet Injections/[facility]  L4-5, L5-S1 Facet Injections/[facility]   Imported By: Maryln Gottron 10/03/2009 09:48:05  _____________________________________________________________________  External Attachment:    Type:   Image     Comment:   External Document

## 2011-01-30 NOTE — Assessment & Plan Note (Signed)
Summary: abd cramps/njr   Vital Signs:  Patient Profile:   77 Years Old Female Weight:      133 pounds Temp:     98.1 degrees F oral Pulse rate:   68 / minute Pulse rhythm:   regular BP sitting:   84 / 58  (left arm)  Vitals Entered By: Raechel Ache, RN (February 09, 2008 11:21 AM)                 Chief Complaint:  C/o bloated and cramping abd x several weeks; last 2 days worse. BM's ok. Nauseated. Finished chemo.Marland Kitchen  History of Present Illness: Abdominal pain, cramping, for 3 weeks. Sxs were stable or improving until yesterday when she developed periumbilical pain, swelling of abdomen, nausea. She had an episode of emesis at the office. Last BM this morning---described as small but other wise normal. No fever, chills, sweats. Pain is better currently. No unusual foods, no travel.  patient's husband called at the end of the day.  Patient worsened during the day.  She developed recurrent nausea and vomiting.  Worsening periumbilical pain.  She still is not had any fevers or chills.  I have reviewed abdominal x-ray.  It appears that she is a small bowel obstruction with air-fluid levels.  She will be admitted with the diagnosis of small bowel obstruction.      Current Allergies: * LIDODERM PATCH MORPHINE SULFATE (MORPHINE SULFATE) PENICILLIN G POTASSIUM (PENICILLIN G POTASSIUM) PHENERGAN (PROMETHAZINE HCL) * STATIN DRUGS  Past Medical History:    Reviewed history from 11/18/2007 and no changes required:       Anemia-NOS       Colon cancer, hx of       GERD       Hyperlipidemia       Osteopenia       chronic back pain  Past Surgical History:    Reviewed history from 11/18/2007 and no changes required:       Appendectomy 1950's       Colectomy, partial 1994       shoulder surgery 04/02/00       enterocoele       knee arthroscopy       SBO--lysis of adhesion       Back Surgery-2007       recurrent colon CA-2008       multiple ESI--back   Social History:  Reviewed history from 11/17/2006 and no changes required:       Married       Never Smoked       Regular exercise-no   Risk Factors:  Exercise:  no   Review of Systems       she has chronic back pain but she denies any other complaints on complete review of systems.   Physical Exam  General:     well-developed, well-nourished female in no acute distress. Head:     atraumatic, normocephalic. Eyes:     pupils are round and reactive to light.  No jaundice or icterus present. Ears:     R ear normal and L ear normal.   Nose:     no external deformity and no external erythema.   Mouth:     pharynx pink and moist and no erythema.   Neck:     No deformities, masses, or tenderness noted. Chest Wall:     No deformities, masses, or tenderness noted. Lungs:     normal respiratory effort, normal breath sounds, no dullness,  and no fremitus.   Heart:     Normal rate and regular rhythm. S1 and S2 normal without gallop, murmur, click, rub or other extra sounds. Abdomen:     Bowel sounds are present but hypoactive.  No high-pitched bowel sounds.,abdomen soft and  without masses, organomegaly or hernias noted.  no significant tenderness to palpation. Msk:     No deformity or scoliosis noted of thoracic or lumbar spine.   Pulses:     R radial normal and L radial normal.   Extremities:     No clubbing, cyanosis, edema, or deformity noted  Neurologic:     cranial nerves II-XII intact and gait normal.   Skin:     Intact without suspicious lesions or rashes Cervical Nodes:     no anterior cervical adenopathy and no posterior cervical adenopathy.      Impression & Recommendations:  Problem # 1:  SMALL BOWEL OBSTRUCTION (ICD-560.9) she appears to have developed a small bowel obstruction.  She has had multiple abdominal procedures most recently with partial colectomy for colon cancer. it is worth noting that she has had small bowel obstructions requiring surgical evaluation  previously.  She does not appear to have a surgical abdomen at this time.  I have reviewed her laboratories.  At this time she has a normal white count.  I am hopeful that with bowel rest, IV fluids that she will improve spontaneously.  We will use an NG tube for recurrent emesis.  Problem # 2:  ADENOCARCINOMA, COLON (ICD-153.9) she has had two primary colon cancers.  Most recent surgery by Dr. Luretha Murphy  Problem # 3:  BACK PAIN (ICD-724.5) chronic problem.  No recent change. Her updated medication list for this problem includes:    Aspir-81 81 Mg Tbec (Aspirin) .Marland Kitchen... Take 1 tablet by mouth once a day    Vicodin 5-500 Mg Tabs (Hydrocodone-acetaminophen) .Marland Kitchen... 1/2-1 tablets two times a day   Problem # 4:  GERD (ICD-530.81) will change to IV protonic. Her updated medication list for this problem includes:    Prevacid 30 Mg Cpdr (Lansoprazole) ..... One daily   Complete Medication List: 1)  Actonel 35 Mg Tabs (Risedronate sodium) .... One weekly 2)  Aspir-81 81 Mg Tbec (Aspirin) .... Take 1 tablet by mouth once a day 3)  Claritin 10 Mg Tabs (Loratadine) .... Take 1 tablet by mouth once a day 4)  Prevacid 30 Mg Cpdr (Lansoprazole) .... One daily 5)  Vicodin 5-500 Mg Tabs (Hydrocodone-acetaminophen) .... 1/2-1 tablets two times a day 6)  Caltrate 600+d 600-400 Mg-unit Tabs (Calcium carbonate-vitamin d) .... Two times a day 7)  Premarin 0.625 Mg/gm Crea (Estrogens, conjugated) .... 3 x week 8)  Fish Oil 1200 Mg Caps (Omega-3 fatty acids) .... 2 once daily 9)  Vitamin B-6 50 Mg Tabs (Pyridoxine hcl) .... Once daily 10)  Multivitamins Tabs (Multiple vitamin) .... Once daily  Other Orders: Venipuncture (95621) TLB-BMP (Basic Metabolic Panel-BMET) (80048-METABOL) TLB-CBC Platelet - w/Differential (85025-CBCD) TLB-Hepatic/Liver Function Pnl (80076-HEPATIC) TLB-Amylase (82150-AMYL) UA Dipstick w/o Micro (81002) T-Acute Abdomen (2 view w/ PA & Chest (30865HQ)     ] Laboratory  Results   Urine Tests    Routine Urinalysis   Color: yellow Appearance: Clear Glucose: negative   (Normal Range: Negative) Bilirubin: negative   (Normal Range: Negative) Ketone: negative   (Normal Range: Negative) Spec. Gravity: 1.015   (Normal Range: 1.003-1.035) Blood: negative   (Normal Range: Negative) pH: 7.5   (Normal Range: 5.0-8.0) Protein: 1+   (  Normal Range: Negative) Urobilinogen: 0.2   (Normal Range: 0-1) Nitrite: negative   (Normal Range: Negative) Leukocyte Esterace: negative   (Normal Range: Negative)    Comments: ...................................................................Milica Zimonjic  February 09, 2008 3:35 PM

## 2011-01-30 NOTE — Letter (Signed)
Summary: [facility]   [facility] Provider: This provider was preselected by the workflow.  Signature: The signature status of this document was preset by the workflow  Processed by [employer] Local Indexer Client @ Friday, November 02, 2009 3:37:37 PM using version:2010.1.2.11(2.4)   Manually Indexed By: [id number]  idlBatchDetail: [id number]   _____________________________________________________________________  External Attachment:    Type:   Image     Comment:   External Document

## 2011-01-30 NOTE — Letter (Signed)
Summary: [facility]  [facility]   Imported By: Kassie Mends 01/05/2008 10:15:16  _____________________________________________________________________  External Attachment:    Type:   Image     Comment:   [facility]

## 2011-01-30 NOTE — Assessment & Plan Note (Signed)
Summary: flu shot/mhf  Nurse Visit    Prior Medications: ACTONEL 35 MG TABS (RISEDRONATE SODIUM) one weekly ASPIR-81 81 MG TBEC (ASPIRIN) Take 1 tablet by mouth once a day CLARITIN 10 MG TABS (LORATADINE) Take 1 tablet by mouth once a day PREVACID 30 MG CPDR (LANSOPRAZOLE) one daily CALTRATE 600+D 600-400 MG-UNIT  TABS (CALCIUM CARBONATE-VITAMIN D) two times a day PREMARIN 0.625 MG/GM  CREA (ESTROGENS, CONJUGATED) 3 X week FISH OIL 1200 MG  CAPS (OMEGA-3 FATTY ACIDS) 2 once daily MULTIVITAMINS   TABS (MULTIPLE VITAMIN) once daily VITAMIN C 500 MG  TABS (ASCORBIC ACID) once daily VITAMIN D 400 UNIT  TABS (CHOLECALCIFEROL) once daily STOOL SOFTENER 100 MG  CAPS (DOCUSATE SODIUM) once daily HYDROCODONE-ACETAMINOPHEN 5-500 MG  TABS (HYDROCODONE-ACETAMINOPHEN) take 1/2-1 tablet two times a day METHENAMINE HIPPURATE 1 GM  TABS (METHENAMINE HIPPURATE) Take 1 tablet by mouth once a day METAMUCIL 30.9 %  POWD (PSYLLIUM) one dose once daily Current Allergies: * LIDODERM PATCH MORPHINE SULFATE (MORPHINE SULFATE) PENICILLIN G POTASSIUM (PENICILLIN G POTASSIUM) PHENERGAN (PROMETHAZINE HCL) * STATIN DRUGS  Flu Vaccine Consent Questions     Do you have a history of severe allergic reactions to this vaccine? no    Any prior history of allergic reactions to egg and/or gelatin? no    Do you have a sensitivity to the preservative Thimersol? no    Do you have a past history of Guillan-Barre Syndrome? no    Do you currently have an acute febrile illness? no    Have you ever had a severe reaction to latex? no    Vaccine information given and explained to patient? yes    Are you currently pregnant? no    Lot Number:[id number]   Site Given  Left Deltoid IM   Orders Added: 1)  Flu Vaccine 34yrs + [16109] 2)  Administration Flu vaccine [G0008]    ]

## 2011-01-30 NOTE — Assessment & Plan Note (Signed)
Summary: rov/mm reschedule/mhf   Vital Signs:  Patient Profile:   77 Years Old Female Weight:      130 pounds Temp:     97.7 degrees F Pulse rate:   70 / minute BP sitting:   124 / 70  (left arm)  Vitals Entered By: Gladis Riffle, RN (March 21, 2008 3:46 PM)                 Chief Complaint:  rov and labs done--states is feeling better.  History of Present Illness:  SMALL BOWEL OBSTRUCTION (ICD-560.9)-no recurrent sxs ADENOCARCINOMA, COLON (ICD-153.9)--recurrent--has required a second surgery BACK PAIN (ICD-724.5)---chronic, but is doing well ABDOMINAL PAIN (IC789.00)-no sxs GERD (ICD-530.81)-no sxs ANEMIA-NOS (ICD-285.9)  Current Meds:  ACTONEL 35 MG TABS (RISEDRONATE SODIUM) one weekly ASPIR-81 81 MG TBEC (ASPIRIN) Take 1 tablet by mouth once a day CLARITIN 10 MG TABS (LORATADINE) Take 1 tablet by mouth once a day PREVACID 30 MG CPDR (LANSOPRAZOLE) one daily CALTRATE 600+D 600-400 MG-UNIT  TABS (CALCIUM CARBONATE-VITAMIN D) two times a day PREMARIN 0.625 MG/GM  CREA (ESTROGENS, CONJUGATED) 3 X week FISH OIL 1200 MG  CAPS (OMEGA-3 FATTY ACIDS) 2 once daily MULTIVITAMINS   TABS (MULTIPLE VITAMIN) once daily NITROFURANTOIN MACROCRYSTAL 100 MG  CAPS (NITROFURANTOIN MACROCRYSTAL) Take 1 capsule by mouth once a day VITAMIN C 500 MG  TABS (ASCORBIC ACID) once daily VITAMIN D 400 UNIT  TABS (CHOLECALCIFEROL) once daily STOOL SOFTENER 100 MG  CAPS (DOCUSATE SODIUM) once daily  Past Medical History: Anemia-NOS Colon cancer, hx of GERD Hyperlipidemia Osteopenia chronic back pain Social History: Married Never Smoked Regular exercise-no       Current Allergies (reviewed today): * LIDODERM PATCH MORPHINE SULFATE (MORPHINE SULFATE) PENICILLIN G POTASSIUM (PENICILLIN G POTASSIUM) PHENERGAN (PROMETHAZINE HCL) * STATIN DRUGS     Review of Systems       no other complaints in a complete ROS    Physical Exam  General:     Well-developed,well-nourished,in no acute  distress; alert,appropriate and cooperative throughout examination Head:     normocephalic and atraumatic.   Eyes:     pupils equal and pupils round.   Neck:     No deformities, masses, or tenderness noted. Lungs:     Normal respiratory effort, chest expands symmetrically. Lungs are clear to auscultation, no crackles or wheezes. Heart:     Normal rate and regular rhythm. S1 and S2 normal without gallop, murmur, click, rub or other extra sounds. Abdomen:     Bowel sounds positive,abdomen soft and non-tender without masses, organomegaly or hernias noted.    Impression & Recommendations:  Problem # 1:  SMALL BOWEL OBSTRUCTION (ICD-560.9) resolved  Problem # 2:  ADENOCARCINOMA, COLON (ICD-153.9) no recurrence known  i have communicated with dr Dondra Spry will be scheduled for colonoscopy  Problem # 3:  BACK PAIN (ICD-724.5) resolved  she is doing remarkably well  Her updated medication list for this problem includes:    Aspir-81 81 Mg Tbec (Aspirin) .Marland Kitchen... Take 1 tablet by mouth once a day   Problem # 4:  OSTEOPENIA (ICD-733.90) ontinue current meds Her updated medication list for this problem includes:    Actonel 35 Mg Tabs (Risedronate sodium) ..... One weekly    Caltrate 600+d 600-400 Mg-unit Tabs (Calcium carbonate-vitamin d) .Marland Kitchen..Marland Kitchen Two times a day    Vitamin D 400 Unit Tabs (Cholecalciferol) ..... Once daily   Complete Medication List: 1)  Actonel 35 Mg Tabs (Risedronate sodium) .... One weekly 2)  Aspir-81 81 Mg  Tbec (Aspirin) .... Take 1 tablet by mouth once a day 3)  Claritin 10 Mg Tabs (Loratadine) .... Take 1 tablet by mouth once a day 4)  Prevacid 30 Mg Cpdr (Lansoprazole) .... One daily 5)  Caltrate 600+d 600-400 Mg-unit Tabs (Calcium carbonate-vitamin d) .... Two times a day 6)  Premarin 0.625 Mg/gm Crea (Estrogens, conjugated) .... 3 x week 7)  Fish Oil 1200 Mg Caps (Omega-3 fatty acids) .... 2 once daily 8)  Multivitamins Tabs (Multiple vitamin) .... Once  daily 9)  Nitrofurantoin Macrocrystal 100 Mg Caps (Nitrofurantoin macrocrystal) .... Take 1 capsule by mouth once a day 10)  Vitamin C 500 Mg Tabs (Ascorbic acid) .... Once daily 11)  Vitamin D 400 Unit Tabs (Cholecalciferol) .... Once daily 12)  Stool Softener 100 Mg Caps (Docusate sodium) .... Once daily   Patient Instructions: 1)  Please schedule a follow-up appointment in 2 months.    ]

## 2011-01-30 NOTE — Progress Notes (Signed)
Summary: Voltaren Gel  Phone Note Refill Request Message from:  [employer] on December 24, 2010 12:58 PM  Refills Requested: Medication #1:  VOLTAREN 1 % GEL apply to affected area up to 4 times daily as needed (as directed). [facility]  [phone]*   Last Fill Date:  11/20/2010   Pharmacy Phone:  [phone]   Method Requested: Electronic Initial call taken by: Delilah Shan CMA Duncan Dull),  December 24, 2010 12:59 PM  Follow-up for Phone Call        Dr Cato Mulligan pt? - do you know why I am getting this?   Follow-up by: Judith Part MD,  December 24, 2010 1:25 PM  Additional Follow-up for Phone Call Additional follow up Details #1::        Looks like you saw the patient on Oct. 8 (maybe Sat. clinic ?)  I'll forward the request.  Delilah Shan CMA Duncan Dull)  December 24, 2010 3:50 PM   thanks-- Dr Cato Mulligan is out so can refil px written on EMR for call in  Additional Follow-up by: Judith Part MD,  December 24, 2010 3:52 PM    Additional Follow-up for Phone Call Additional follow up Details #2::    Med sent electronically to [facility] as instructed.Lewanda Rife LPN  December 24, 2010 5:24 PM   New/Updated Medications: VOLTAREN 1 % GEL (DICLOFENAC SODIUM) apply to affected area up to 4 times daily as needed (as directed) Prescriptions: VOLTAREN 1 % GEL (DICLOFENAC SODIUM) apply to affected area up to 4 times daily as needed (as directed)  #1 large x 0   Entered by:   Lewanda Rife LPN   Authorized by:   Judith Part MD   Signed by:   Lewanda Rife LPN on 29/56/2130   Method used:   [facility]ically to        [facility]  [phone] (retail)       [address] [place], [place]  [address]       Ph: [phone] or [phone]       Fax: [phone]   RxID:   [id number]

## 2011-01-30 NOTE — Letter (Signed)
Summary: [facility]  [facility]   Imported By: Sherian Rein 05/30/2010 15:45:32  _____________________________________________________________________  External Attachment:    Type:   Image     Comment:   External Document

## 2011-01-30 NOTE — Procedures (Signed)
Summary: Colonoscopy   Colonoscopy  Procedure date:  08/03/2003  Findings:      Results: Diverticulosis.       Location:  [facility].   Patient Name: [name] Barker, [name] Barker MRN:  Procedure Procedures: Colonoscopy CPT: [phone].  Personnel: Endoscopist: Venita Lick. Russella Dar, MD, Clementeen Graham.  Exam Location: Exam performed in Endoscopy Suite. Outpatient  Patient Consent: Procedure, Alternatives, Risks and Benefits discussed, consent obtained, from patient.  Indications Symptoms: Constipation Abdominal pain / bloating.  Surveillance of: Colorectal Cancer. Index tumor removed in 1994, month of Nov. Index cancerous lesion was  located in proximal (splenic flexure and beyond) colon.  History  Pre-Exam Physical: Performed Aug 03, 2003. Cardio-pulmonary exam, Rectal exam, HEENT exam  WNL. Abdominal exam abnormal. Extremity exam, Neurological exam, Mental status exam WNL. Abnormal PE findings include: mild RLQ, R flank and R back tenderness.  Exam Exam: Extent of exam reached: Transverse Colon, extent intended: Terminal Ileum.  Incomplete due to patient discomfort. Colon retroflexion performed. ASA Classification: II. Tolerance: fair, exam compromised.  Monitoring: Pulse and BP monitoring, Oximetry used. Supplemental O2 given.  Colon Prep Used Golytely for colon prep. Prep results: good.  Sedation Meds: Patient assessed and found to be appropriate for moderate (conscious) sedation. Fentanyl 87.5 given IV. Versed 7 mg. given IV.  Comments: Suspect adhesions contributing to incomplete procedure and possible cause of ongoing RLQ pain Findings NOT SEEN ON EXAM: Ascending Colon to Transverse Colon.  - PRIOR SURGERY:  - DIVERTICULOSIS: Transverse Colon to Sigmoid Colon. Not bleeding. ICD9: Diverticulosis: 562.10.  NORMAL EXAM: Rectum.   Assessment  Diagnoses: 562.10: Diverticulosis.   Events  Unplanned Interventions: No intervention was required.  Unplanned Events: There  were no complications. Plans Medication Plan: Continue current medications.  Patient Education: Patient given standard instructions for: Diverticulosis. Constipation. Disposition: After procedure patient sent to recovery. After recovery patient sent home.  Scheduling/Referral: Barium Enema, RLQ pain, incomplete colonoscopy, around Aug 17, 2003.  Primary Care Provider, to Bruce H. Swords, MD,    This report was created from the original endoscopy report, which was reviewed and signed by the above listed endoscopist.    cc: Ladona Horns. Mariel Sleet, MD

## 2011-01-30 NOTE — Assessment & Plan Note (Signed)
Summary: LOWER BACK PAIN/DLO   Vital Signs:  Patient profile:   77 year old female Height:      59 inches Weight:      133 pounds O2 Sat:      93 % on Room air Temp:     96.8 degrees F oral Pulse rate:   67 / minute BP sitting:   117 / 70  (left arm) Cuff size:   regular  Vitals Entered By: Jarome Lamas (October 05, 2010 11:34 AM)  O2 Flow:  Room air CC: low back pain/pb   History of Present Illness: has a hx of low back pain -- sees Dr Tawanna Solo for pain management  uses voltaren gel  is out of her gel - ran out and was using a sample  just needs a px  back pain was worse during the night   used to take vicodin and no longer needs it due to imp with voltaren gel  sacroiliac pain - for years   back surgery in past   gel does not give her stomach upset  needs refil since Dr Vear Clock office is not open   has taken nsaids oral in the past with no improvement  Current Medications (verified): 1)  Actonel 35 Mg Tabs (Risedronate Sodium) .... One Weekly 2)  Aspir-81 81 Mg Tbec (Aspirin) .... Take 1 Tablet By Mouth Once A Day 3)  Claritin 10 Mg Tabs (Loratadine) .... Take 1 Tablet By Mouth Once A Day 4)  Prevacid 15 Mg Cpdr (Lansoprazole) .... Once Daily 5)  Fish Oil 1200 Mg  Caps (Omega-3 Fatty Acids) .... 2 Once Daily 6)  Vitamin D 1000 Unit Tabs (Cholecalciferol) .... Two Times A Day 7)  Stool Softener 100 Mg  Caps (Docusate Sodium) .... Two Two Times A Day 8)  Metamucil 30.9 %  Powd (Psyllium) .... One Dose Once Daily 9)  Senokot S 8.6-50 Mg Tabs (Sennosides-Docusate Sodium) .... Two Tablets Once Daily 10)  Muro 128 2 % Soln (Sodium Chloride (Hypertonic)) .... Qid 11)  Bepreve 1.5 % Soln (Bepotastine Besilate) .... Two Times A Day 12)  Calcium Citrate 600mg  .... Two Times A Day 13)  Ferrous Sulfate 325 (65 Fe) Mg Tabs (Ferrous Sulfate) .... Once Daily 14)  Hydrocodone-Acetaminophen 7.5-500 Mg/12ml Soln (Hydrocodone-Acetaminophen) .Marland Kitchen.. 1 Tsp By Mouth Three Times A Day  As Needed For Pain 15)  Proair Hfa 108 (90 Base) Mcg/act Aers (Albuterol Sulfate) .... Use Once A Day As Needed 16)  Premarin 0.625 Mg/gm Crea (Estrogens, Conjugated) .... Use Vaginally Daily 17)  Multivitamins  Tabs (Multiple Vitamin) .... Take One By Mouth Once Daily 18)  Miralax  Powd (Polyethylene Glycol 3350) .... Take One Capful Daily Mixed in 8 Ounce of Water  Allergies (verified): 1)  ! Methenamine Mandelate (Methenamine Mandelate) 2)  ! Nsaids 3)  * Lidoderm Patch 4)  Morphine Sulfate (Morphine Sulfate) 5)  Penicillin G Potassium (Penicillin G Potassium) 6)  Phenergan (Promethazine Hcl) 7)  * Statin Drugs  Past History:  Past Medical History: Last updated: 05/30/2010 Anemia T2 colon cancer 1994 T3, N1 colon cancer 2008 GERD Hyperlipidemia Osteopenia chronic back pain Small Bowel Obstruction Diverticulosis Hemorrhoids Arthritis Allergic rhinits  Past Surgical History: Last updated: 05/30/2010 Appendectomy 1950's Right hemicolectomy, colon cancer, 1994 shoulder surgery 03/2000 enterocoele knee arthroscopy SBO--lysis of adhesion Back Surgery-2007 Segmental colectomy, recurrent colon cancer, 2008 multiple ESI--back SBO-2009-did not require surgical intervention Hysterectomy Rotator Cuff Repair pelvic prolapse with anterior vaginal vault repair bilateral cateract surgery eye  lid lift  Family History: Last updated: 05/30/2010 family history of lung cancer family history of alcoholism patients family does not live in this county and she does not know alot of her family history  Social History: Last updated: 05/30/2010 Married Never Smoked Regular exercise-no Alcohol Use - no Daily Caffeine Use one per day Illicit Drug Use - no  Risk Factors: Exercise: no (02/09/2008)  Risk Factors: Smoking Status: never (06/21/2010)  Review of Systems General:  Denies chills, fatigue, fever, and loss of appetite. MS:  Complains of low back pain, mid back pain,  and stiffness; denies cramps and muscle weakness. Derm:  Denies itching and rash. Heme:  Denies abnormal bruising and bleeding.  Physical Exam  General:  healthy appearing elderly female Mouth:  pharynx pink and moist.   Neck:  nl rom  Msk:  mild LS tenderness with limited rom  no skin change  nl gait - is slow    Extremities:  no edema  Skin:  Intact without suspicious lesions or rashes Psych:  normal affect, talkative and pleasant    Impression & Recommendations:  Problem # 1:  LOW BACK PAIN, CHRONIC (ICD-724.2) Assessment Deteriorated  needs refil of voltaren gel and Dr Phillip's office is closed no side eff  no new symptoms  med refilled  Her updated medication list for this problem includes:    Aspir-81 81 Mg Tbec (Aspirin) .Marland Kitchen... Take 1 tablet by mouth once a day    Hydrocodone-acetaminophen 7.5-500 Mg/40ml Soln (Hydrocodone-acetaminophen) .Marland Kitchen... 1 tsp by mouth three times a day as needed for pain  Orders: Prescription Created Electronically [phone])  Complete Medication List: 1)  Actonel 35 Mg Tabs (Risedronate sodium) .... One weekly 2)  Aspir-81 81 Mg Tbec (Aspirin) .... Take 1 tablet by mouth once a day 3)  Claritin 10 Mg Tabs (Loratadine) .... Take 1 tablet by mouth once a day 4)  Prevacid 15 Mg Cpdr (Lansoprazole) .... Once daily 5)  Fish Oil 1200 Mg Caps (Omega-3 fatty acids) .... 2 once daily 6)  Vitamin D 1000 Unit Tabs (Cholecalciferol) .... Two times a day 7)  Stool Softener 100 Mg Caps (Docusate sodium) .... Two two times a day 8)  Metamucil 30.9 % Powd (Psyllium) .... One dose once daily 9)  Senokot S 8.6-50 Mg Tabs (Sennosides-docusate sodium) .... Two tablets once daily 10)  Muro 128 2 % Soln (Sodium chloride (hypertonic)) .... Qid 11)  Bepreve 1.5 % Soln (Bepotastine besilate) .... Two times a day 12)  Calcium Citrate 600mg   .... Two times a day 13)  Ferrous Sulfate 325 (65 Fe) Mg Tabs (Ferrous sulfate) .... Once daily 14)  Hydrocodone-acetaminophen  7.5-500 Mg/11ml Soln (Hydrocodone-acetaminophen) .Marland Kitchen.. 1 tsp by mouth three times a day as needed for pain 15)  Proair Hfa 108 (90 Base) Mcg/act Aers (Albuterol sulfate) .... Use once a day as needed 16)  Premarin 0.625 Mg/gm Crea (Estrogens, conjugated) .... Use vaginally daily 17)  Multivitamins Tabs (Multiple vitamin) .... Take one by mouth once daily 18)  Miralax Powd (Polyethylene glycol 3350) .... Take one capful daily mixed in 8 ounce of water 19)  Voltaren 1 % Gel (Diclofenac sodium) .... Apply to affected area up to 4 times daily as needed (as directed)  Patient Instructions: 1)  I sent your voltaren gel px to your pharmacy  2)  let your regular doctor know if any problems or if your pain worsens Prescriptions: VOLTAREN 1 % GEL (DICLOFENAC SODIUM) apply to affected area up to 4 times  daily as needed (as directed)  #1 large x 1   Entered and Authorized by:   Judith Part MD   Signed by:   Judith Part MD on 10/05/2010   Method used:   Electronically to        [facility]  [phone]* (retail)       [address] [place], [place]  [address]       Ph: [phone] or [phone]       Fax: [phone]   RxID:   [id number]

## 2011-01-30 NOTE — Progress Notes (Signed)
Summary: Schedule virtual colon  Phone Note Outgoing Call Call back at [facility] Phone [phone]   Call placed by: Darcey Nora RN, CGRN,  April 09, 2010 3:28 PM Call placed to: .[email or web address] Summary of Call: Left message for patient to call back to discuss scheduling colon Initial call taken by: Darcey Nora RN, CGRN,  April 09, 2010 3:29 PM  Follow-up for Phone Call        Patient  is in [place], she will not return for 2 weeks.  her son will have her call back when she returns.  I will send myself a reminder flag. Follow-up by: Darcey Nora RN, CGRN,  April 12, 2010 9:17 AM

## 2011-01-30 NOTE — Progress Notes (Signed)
Summary: Call A Nurse  Phone Note Outgoing Call   Summary of Call: ok to refill meds Called her.  She went to hospital & Dr. Milinda Antis took care of her and gave her the refill she needed.  Rudy Jew, RN  October 07, 2010 10:50 AM  Initial call taken by: Rudy Jew, RN,  October 07, 2010 10:50 AM     [facility] Triage Call Report Triage Record Num: [id number] Operator: Tomasita Crumble Patient Name: [name] Barker Call Date & Time: 10/05/2010 10:21:16AM Patient Phone: [phone] PCP: Valetta Mole. Swords Patient Gender: Female PCP Fax : [phone] Patient DOB: [birth year] Practice Name: [facility] Reason for Call: Spouse/ [name] Barker calling about [name] Barker; states she has been doing well with Voltaren gel Rx. by Dr. Vear Clock for chronic back pain & reports it works better than other Rx. meds. Asks for refill. Advised need to contact prescriber or go to clinic at [facility]; transferred to [facility] office for appt. Medication Questions protocol used. . Protocol(s) Used: Medication Question Calls, No Triage (Adults) Recommended Outcome per Protocol: Call Ardell Makarewicz within 24 Hours Override Outcome if Used in Protocol: See Dorinne Graeff within 24 hours RN Reason for Override Outcome: Nursing Judgement Used. Reason for Outcome: Caller has medication question about med not prescribed by PCP and triager unable to answer question (e.g. compatibility with other med, storage) Care Advice:  ~ 10/

## 2011-01-30 NOTE — Progress Notes (Signed)
Summary: [name] Barker  Phone Note Call from Patient Call back at Home Phone [phone]   Caller: Spouse Call For: DR SWORDS Summary of Call: PER PT SPOUSE PT IS REQUESTING GENERIC PREVACID 30 MG FOR MAILORDER 90 DAYS WITH REFILLS Initial call taken by: Heron Sabins,  July 27, 2008 9:53 AM  Follow-up for Phone Call        rx ready for pick up Follow-up by: Kern Reap CMA,  July 27, 2008 12:00 PM      Prescriptions: PREVACID 30 MG CPDR (LANSOPRAZOLE) one daily  #90 x 4   Entered by:   Kern Reap CMA   Authorized by:   Birdie Sons MD   Signed by:   Kern Reap CMA on 07/27/2008   Method used:   Print then Give to Patient   RxID:   [id number]

## 2011-01-30 NOTE — Assessment & Plan Note (Signed)
Summary: post hosp/ccm   Vital Signs:  Patient Profile:   77 Years Old Female Weight:      131 pounds Temp:     97.7 degrees F Pulse rate:   72 / minute BP sitting:   104 / 66  Vitals Entered By: Gladis Riffle, RN (February 21, 2008 2:10 PM)                 Chief Complaint:  hospital follow up for bowel obstruction--states due for colonoscopy and wants to discuss.  History of Present Illness: pt here for f/u after hospitalization  reviewed xray and lab reports from hospita husband with patient-he provides part of the hx SMALL BOWEL OBSTRUCTION (ICD-560.9)--clinically has resolved. ADENOCARCINOMA, COLON (ICD-153.9)-may need colonoscopy in the near future. ANEMIA, CHRONIC (ICD-281.9)-reviewed laboratories. GERD (ICD-530.81)-doing well on current medications.  Current Meds:  ACTONEL 35 MG TABS (RISEDRONATE SODIUM) one weekly ASPIR-81 81 MG TBEC (ASPIRIN) Take 1 tablet by mouth once a day CLARITIN 10 MG TABS (LORATADINE) Take 1 tablet by mouth once a day PREVACID 30 MG CPDR (LANSOPRAZOLE) one daily CALTRATE 600+D 600-400 MG-UNIT  TABS (CALCIUM CARBONATE-VITAMIN D) two times a day PREMARIN 0.625 MG/GM  CREA (ESTROGENS, CONJUGATED) 3 X week FISH OIL 1200 MG  CAPS (OMEGA-3 FATTY ACIDS) 2 once daily MULTIVITAMINS   TABS (MULTIPLE VITAMIN) once daily NITROFURANTOIN MACROCRYSTAL 100 MG  CAPS (NITROFURANTOIN MACROCRYSTAL) Take 1 capsule by mouth once a day VITAMIN C 500 MG  TABS (ASCORBIC ACID) once daily   Social History: Married Never Smoked Regular exercise-no   Past Medical History: Anemia-NOS Colon cancer, hx of GERD Hyperlipidemia Osteopenia chronic back pain       Current Allergies (reviewed today): * LIDODERM PATCH MORPHINE SULFATE (MORPHINE SULFATE) PENICILLIN G POTASSIUM (PENICILLIN G POTASSIUM) PHENERGAN (PROMETHAZINE HCL) * STATIN DRUGS  Past Medical History:    Reviewed history from 11/18/2007 and no changes required:       Anemia-NOS       Colon  cancer, hx of       GERD       Hyperlipidemia       Osteopenia       chronic back pain  Past Surgical History:    Appendectomy 1950's    Colectomy, partial 1994    shoulder surgery 04/02/00    enterocoele    knee arthroscopy    SBO--lysis of adhesion    Back Surgery-2007    recurrent colon CA-2008    multiple ESI--back    SBO-2009-did not require surgical intervention     Review of Systems       she reports one day of rectal bleeding, she admits to chronic fatigue but she is exercising regularly.  No other complaints in a complete review of systems.   Physical Exam  General:     Well-developed,well-nourished,in no acute distress; alert,appropriate and cooperative throughout examination Head:     normocephalic and atraumatic.   Eyes:     pupils equal and pupils round.   Ears:     R ear normal and L ear normal.   Neck:     No deformities, masses, or tenderness noted. Chest Wall:     No deformities, masses, or tenderness noted. Lungs:     Normal respiratory effort, chest expands symmetrically. Lungs are clear to auscultation, no crackles or wheezes. Heart:     normal rate, regular rhythm, no gallop, no rub, and no JVD.   Abdomen:     Bowel sounds positive,abdomen soft and non-tender  without masses, organomegaly or hernias noted. Msk:     No deformity or scoliosis noted of thoracic or lumbar spine.   Pulses:     R radial normal and L radial normal.   Neurologic:     cranial nerves II-XII intact.  broad-based gait.  Uses cane. Cervical Nodes:     No lymphadenopathy noted Inguinal Nodes:     No significant adenopathy Psych:     normally interactive and good eye contact.      Impression & Recommendations:  Problem # 1:  SMALL BOWEL OBSTRUCTION (ICD-560.9) resolved she and her husband understands signs and symptoms of recurrent small bowel obstruction.  Etiology is small bowel obstruction discussed with them.  Problem # 2:  ADENOCARCINOMA, COLON  (ICD-153.9) recurrent---I will check to see if she needs another colonoscopy. communication with Dr. Russella Dar.  He will contact the patient to follow-up.  Problem # 3:  HYPERLIPIDEMIA (ICD-272.4) may need to recheck lipid panel in the near future. Labs Reviewed: SGOT: 25 (02/09/2008)   SGPT: 16 (02/09/2008)  Lipid Goals: Chol Goal: 200 (12/08/2006)   HDL Goal: 40 (12/08/2006)   LDL Goal: 130 (12/08/2006)   TG Goal: 150 (12/08/2006)  Prior 10 Yr Risk Heart Disease: Not enough information (12/08/2006)   Problem # 4:  ANEMIA-NOS (ICD-285.9) had rectal bleeding--one day.  She will come back if any recurrence. note admit hgb normal, d/c hgb 10.8 g/dlHgb: 13.1 (02/09/2008)   Hct: 39.7 (02/09/2008)   RDW: 12.0 (02/09/2008)   MCV: 95.6 (02/09/2008)   MCHC: 33.0 (02/09/2008)   Complete Medication List: 1)  Actonel 35 Mg Tabs (Risedronate sodium) .... One weekly 2)  Aspir-81 81 Mg Tbec (Aspirin) .... Take 1 tablet by mouth once a day 3)  Claritin 10 Mg Tabs (Loratadine) .... Take 1 tablet by mouth once a day 4)  Prevacid 30 Mg Cpdr (Lansoprazole) .... One daily 5)  Caltrate 600+d 600-400 Mg-unit Tabs (Calcium carbonate-vitamin d) .... Two times a day 6)  Premarin 0.625 Mg/gm Crea (Estrogens, conjugated) .... 3 x week 7)  Fish Oil 1200 Mg Caps (Omega-3 fatty acids) .... 2 once daily 8)  Multivitamins Tabs (Multiple vitamin) .... Once daily 9)  Nitrofurantoin Macrocrystal 100 Mg Caps (Nitrofurantoin macrocrystal) .... Take 1 capsule by mouth once a day 10)  Vitamin C 500 Mg Tabs (Ascorbic acid) .... Once daily  Other Orders: Venipuncture (16109) TLB-CBC Platelet - w/Differential (85025-CBCD)     ]

## 2011-01-30 NOTE — Letter (Signed)
Summary: [facility]  [facility]   Imported By: Maryln Gottron 03/27/2010 09:59:29  _____________________________________________________________________  External Attachment:    Type:   Image     Comment:   External Document

## 2011-01-30 NOTE — Assessment & Plan Note (Signed)
Summary: W6A/RCD   Vital Signs:  Patient Profile:   77 Years Old Female Weight:      132 pounds Temp:     98.2 degrees F oral Pulse rate:   66 / minute Pulse rhythm:   regular Resp:     12 per minute BP sitting:   118 / 70  Vitals Entered By: Lynann Beaver CMA (August 19, 2007 11:01 AM)               Chief Complaint:  rov.  History of Present Illness: f/u of lipids, she has chronic Right sided low back pain---can radiate to rlq---immediately relieved after BM  Follow-Up Visit      This is an 77 year old woman who presents for Follow-up visit.  The patient denies chest pain, palpitations, dizziness, syncope, low blood sugar symptoms, high blood sugar symptoms, edema, SOB, DOE, PND, and orthopnea.  Since the last visit the patient notes no new problems or concerns.  The patient reports taking meds as prescribed.  When questioned about possible medication side effects, the patient notes none.    Current Allergies: * LIDODERM PATCH MORPHINE SULFATE (MORPHINE SULFATE) PENICILLIN G POTASSIUM (PENICILLIN G POTASSIUM) PHENERGAN (PROMETHAZINE HCL) * STATIN DRUGS  Past Medical History:    Reviewed history from 11/17/2006 and no changes required:       Anemia-NOS       Colon cancer, hx of       GERD       Hyperlipidemia       Osteopenia  Past Surgical History:    Reviewed history from 06/28/2007 and no changes required:       Appendectomy 1950's       Colectomy, partial 1994       shoulder surgery 04/02/00       enterocoele       knee arthroscopy       SBO--lysis of adhesion       Back Surgery-2007     Review of Systems       she complains of intermittent fatigue.  She takes a nap in the middle of the day and feels better after that.  Her appetite is somewhat decreased but improving.  She denies any other complaints on complete review of systems.   Physical Exam  General:     Well-developed,well-nourished,in no acute distress; alert,appropriate and cooperative  throughout examination Head:     normocephalic and atraumatic.   Ears:     R ear normal, L ear normal, and no external deformities.   Nose:     no external deformity and no external erythema.   Neck:     supple, full ROM, and no masses.   Chest Wall:     no deformities and no tenderness.   Lungs:     normal respiratory effort and no intercostal retractions.   Heart:     regular rhythm and no gallop.   Abdomen:     Bowel sounds positive,abdomen soft and non-tender without masses, organomegaly or hernias noted. Pulses:     R radial normal and L radial normal.   Extremities:     trace left pedal edema and trace right pedal edema.   Neurologic:     alert & oriented X3 and cranial nerves II-XII intact.   Skin:     Intact without suspicious lesions or rashes Cervical Nodes:     No lymphadenopathy noted Axillary Nodes:     No palpable lymphadenopathy Inguinal Nodes:  No significant adenopathy Psych:     Cognition and judgment appear intact. Alert and cooperative with normal attention span and concentration. No apparent delusions, illusions, hallucinations    Impression & Recommendations:  Problem # 1:  ANEMIA, CHRONIC (ICD-281.9) reviewed labs from dr. Myrle Sheng. no further evaluation at this time.   Problem # 2:  ADENOCARCINOMA, COLON (ICD-153.9) as above--she has regular follow with Dr. Truett Perna.  Problem # 3:  BACK PAIN (ICD-724.5) chronic no change in meds, Reviewed meds and xray Her updated medication list for this problem includes:    Aspir-81 81 Mg Tbec (Aspirin) .Marland Kitchen... Take 1 tablet by mouth once a day    Vicodin 5-500 Mg Tabs (Hydrocodone-acetaminophen) .Marland Kitchen... Take 1-2 tablet by mouth every four to six hours   Problem # 4:  ABDOMINAL PAIN (ICD-789.00) chronic, no change in meds  Problem # 5:  HYPERLIPIDEMIA (ICD-272.4) currently no meds. Labs Reviewed: SGOT: 26 (11/17/2006)   SGPT: 14 (11/17/2006)  Lipid Goals: Chol Goal: 200 (12/08/2006)   HDL Goal: 40  (12/08/2006)   LDL Goal: 130 (12/08/2006)   TG Goal: 150 (12/08/2006)  Prior 10 Yr Risk Heart Disease: Not enough information (12/08/2006)   Complete Medication List: 1)  Actonel 35 Mg Tabs (Risedronate sodium) .... One weekly 2)  Aspir-81 81 Mg Tbec (Aspirin) .... Take 1 tablet by mouth once a day 3)  Claritin 10 Mg Tabs (Loratadine) .... Take 1 tablet by mouth once a day 4)  Lyrica 50 Mg Caps (Pregabalin) .... Take 1 capsule by mouth twice a day 5)  Nitrofurantoin Monohyd Macro 100 Mg Caps (Nitrofurantoin monohyd macro) .Marland Kitchen.. 1 once a day 6)  Prevacid 30 Mg Cpdr (Lansoprazole) .... One daily 7)  Vicodin 5-500 Mg Tabs (Hydrocodone-acetaminophen) .... Take 1-2 tablet by mouth every four to six hours 8)  Xeloda 500 Mg Tabs (Capecitabine) .... One six times daily 9)  Diasense Multivitamin Tabs (Multiple vitamins-minerals) .... One by mouth daily 10)  Calcium 600 1500 Mg Tabs (Calcium carbonate) .... 2 daily   Patient Instructions: 1)  Please schedule a follow-up appointment in 3 months.

## 2011-01-30 NOTE — Assessment & Plan Note (Signed)
Summary: LOWER BACK PAIN // RS   Vital Signs:  Patient profile:   77 year old female Weight:      134 pounds Temp:     98.0 degrees F oral BP sitting:   108 / 64  (left arm) Cuff size:   regular  Vitals Entered By: Alfred Levins, CMA (November 19, 2010 11:47 AM) CC: sore lt hip, fell down 2wks ago   Primary Care Layken Doenges:  Birdie Sons, MD  CC:  sore lt hip and fell down 2wks ago.  History of Present Illness: fell over a step stool 2 weeks ago---has persistent Left hip pain. large bruise. Left hip pain is actually worse now than immediately after the fall pt has a long hx of LBP---generally has been doing better with dr . Vear Clock  review of systems: Patient has chronic back pain. After recent fall back pain is somewhat different. She states that in general her back pain is much improved after seeing pain management..  Current Medications (verified): 1)  Actonel 35 Mg Tabs (Risedronate Sodium) .... One Weekly 2)  Aspir-81 81 Mg Tbec (Aspirin) .... Take 1 Tablet By Mouth Once A Day 3)  Claritin 10 Mg Tabs (Loratadine) .... Take 1 Tablet By Mouth Once A Day 4)  Prevacid 15 Mg Cpdr (Lansoprazole) .... Once Daily 5)  Fish Oil 1200 Mg  Caps (Omega-3 Fatty Acids) .... 2 Once Daily 6)  Vitamin D 1000 Unit Tabs (Cholecalciferol) .... Two Times A Day 7)  Stool Softener 100 Mg  Caps (Docusate Sodium) .... Two Two Times A Day 8)  Metamucil 30.9 %  Powd (Psyllium) .... One Dose Once Daily 9)  Senokot S 8.6-50 Mg Tabs (Sennosides-Docusate Sodium) .... Two Tablets Once Daily 10)  Calcium Citrate 600mg  .... Two Times A Day 11)  Ferrous Sulfate 325 (65 Fe) Mg Tabs (Ferrous Sulfate) .... Once Daily 12)  Hydrocodone-Acetaminophen 7.5-500 Mg/65ml Soln (Hydrocodone-Acetaminophen) .Marland Kitchen.. 1 Tsp By Mouth Three Times A Day As Needed For Pain 13)  Proair Hfa 108 (90 Base) Mcg/act Aers (Albuterol Sulfate) .... Use Once A Day As Needed 14)  Premarin 0.625 Mg/gm Crea (Estrogens, Conjugated) .... Use  Vaginally Daily 15)  Multivitamins  Tabs (Multiple Vitamin) .... Take One By Mouth Once Daily 16)  Voltaren 1 % Gel (Diclofenac Sodium) .... Apply To Affected Area Up To 4 Times Daily As Needed (As Directed)  Allergies (verified): 1)  ! Methenamine Mandelate (Methenamine Mandelate) 2)  ! Nsaids 3)  * Lidoderm Patch 4)  Morphine Sulfate (Morphine Sulfate) 5)  Penicillin G Potassium (Penicillin G Potassium) 6)  Phenergan (Promethazine Hcl) 7)  * Statin Drugs  Past History:  Past Medical History: Last updated: 05/30/2010 Anemia T2 colon cancer 1994 T3, N1 colon cancer 2008 GERD Hyperlipidemia Osteopenia chronic back pain Small Bowel Obstruction Diverticulosis Hemorrhoids Arthritis Allergic rhinits  Past Surgical History: Last updated: 05/30/2010 Appendectomy 1950's Right hemicolectomy, colon cancer, 1994 shoulder surgery 03/2000 enterocoele knee arthroscopy SBO--lysis of adhesion Back Surgery-2007 Segmental colectomy, recurrent colon cancer, 2008 multiple ESI--back SBO-2009-did not require surgical intervention Hysterectomy Rotator Cuff Repair pelvic prolapse with anterior vaginal vault repair bilateral cateract surgery eye lid lift  Family History: Last updated: 05/30/2010 family history of lung cancer family history of alcoholism patients family does not live in this county and she does not know alot of her family history  Social History: Last updated: 05/30/2010 Married Never Smoked Regular exercise-no Alcohol Use - no Daily Caffeine Use one per day Illicit Drug Use - no  Risk Factors: Exercise: no (02/09/2008)  Risk Factors: Smoking Status: never (06/21/2010)  Physical Exam  General:  elderly female in no acute distress. HEENT exam atraumatic, normocephalic symmetric her muscles are intact. Neck is supple. Chest full range of motion of both hips.   Impression & Recommendations:  Problem # 1:  HIP PAIN, LEFT (ICD-719.45)  needs further  evaluation need to rule out fracture. The fracture is not present I don't think any further evaluation necessary after that. Her updated medication list for this problem includes:    Aspir-81 81 Mg Tbec (Aspirin) .Marland Kitchen... Take 1 tablet by mouth once a day    Hydrocodone-acetaminophen 7.5-500 Mg/58ml Soln (Hydrocodone-acetaminophen) .Marland Kitchen... 1 tsp by mouth three times a day as needed for pain  Orders: T-Bilateral Hip w/Pelvis, min 2 views (73520TC)  Complete Medication List: 1)  Actonel 35 Mg Tabs (Risedronate sodium) .... One weekly 2)  Aspir-81 81 Mg Tbec (Aspirin) .... Take 1 tablet by mouth once a day 3)  Claritin 10 Mg Tabs (Loratadine) .... Take 1 tablet by mouth once a day 4)  Prevacid 15 Mg Cpdr (Lansoprazole) .... Once daily 5)  Fish Oil 1200 Mg Caps (Omega-3 fatty acids) .... 2 once daily 6)  Vitamin D 1000 Unit Tabs (Cholecalciferol) .... Two times a day 7)  Stool Softener 100 Mg Caps (Docusate sodium) .... Two two times a day 8)  Metamucil 30.9 % Powd (Psyllium) .... One dose once daily 9)  Senokot S 8.6-50 Mg Tabs (Sennosides-docusate sodium) .... Two tablets once daily 10)  Calcium Citrate 600mg   .... Two times a day 11)  Ferrous Sulfate 325 (65 Fe) Mg Tabs (Ferrous sulfate) .... Once daily 12)  Hydrocodone-acetaminophen 7.5-500 Mg/8ml Soln (Hydrocodone-acetaminophen) .Marland Kitchen.. 1 tsp by mouth three times a day as needed for pain 13)  Proair Hfa 108 (90 Base) Mcg/act Aers (Albuterol sulfate) .... Use once a day as needed 14)  Premarin 0.625 Mg/gm Crea (Estrogens, conjugated) .... Use vaginally daily 15)  Multivitamins Tabs (Multiple vitamin) .... Take one by mouth once daily 16)  Voltaren 1 % Gel (Diclofenac sodium) .... Apply to affected area up to 4 times daily as needed (as directed) Prescriptions: HYDROCODONE-ACETAMINOPHEN 7.5-500 MG/15ML SOLN (HYDROCODONE-ACETAMINOPHEN) 1 tsp by mouth three times a day as needed for pain  #240 cc x 1   Entered by:   Alfred Levins, CMA   Authorized  by:   Birdie Sons MD   Signed by:   Alfred Levins, CMA on 11/20/2010   Method used:   Telephoned to ...       [facility]  [phone]* (retail)       [address] [place], [place]  [address]       Ph: [phone] or [phone]       Fax: [phone]   RxID:   [id number]    Orders Added: 1)  Est. Patient Level III [36644] 2)  T-Bilateral Hip w/Pelvis, min 2 views [73520TC]

## 2011-01-30 NOTE — Assessment & Plan Note (Signed)
Summary: COUGH/NJR   Vital Signs:  Patient profile:   77 year old female Temp:     98.4 degrees F Pulse rate:   64 / minute Resp:     12 per minute BP sitting:   112 / 70  (left arm)  Vitals Entered By: Gladis Riffle, RN (October 19, 2009 9:11 AM)   History of Present Illness: Cough for 2 months---typically nocturnal. She wonders about allergies: dust, dogs. Dry cough---"feels like it's in my throat".   Inaddition she generally feels weak. She describes a feeling that she isn't interested indoing things as normal. BUT she goes to the gym everyday and uses an eliptical machine for 30 minutes. During normal activities she feels as if her legs are weak. She does not have stairs at her home.  All other systems reviewed and were negative   Preventive Screening-Counseling & Management  Alcohol-Tobacco     Smoking Status: never  Current Problems (verified): 1)  Cough  (ICD-786.2) 2)  Adenocarcinoma, Colon  (ICD-153.9) 3)  Osteopenia  (ICD-733.90) 4)  Hyperlipidemia  (ICD-272.4) 5)  Gerd  (ICD-530.81) 6)  Anemia-nos  (ICD-285.9)  Current Medications (verified): 1)  Actonel 35 Mg Tabs (Risedronate Sodium) .... One Weekly 2)  Aspir-81 81 Mg Tbec (Aspirin) .... Take 1 Tablet By Mouth Once A Day 3)  Claritin 10 Mg Tabs (Loratadine) .... Take 1 Tablet By Mouth Once A Day 4)  Prevacid 15 Mg Cpdr (Lansoprazole) .... Once Daily 5)  Caltrate 600+d 600-400 Mg-Unit  Tabs (Calcium Carbonate-Vitamin D) .... Two Times A Day 6)  Fish Oil 1200 Mg  Caps (Omega-3 Fatty Acids) .... 2 Once Daily 7)  Multivitamins   Tabs (Multiple Vitamin) .... Once Daily 8)  Vitamin C 500 Mg  Tabs (Ascorbic Acid) .... Once Daily 9)  Vitamin D 1000 Unit Tabs (Cholecalciferol) .... Two Times A Day 10)  Stool Softener 100 Mg  Caps (Docusate Sodium) .... Two Two Times A Day 11)  Methenamine Hippurate 1 Gm  Tabs (Methenamine Hippurate) .... Take 1 Tablet By Mouth Once A Day 12)  Metamucil 30.9 %  Powd (Psyllium) .... One  Dose Once Daily 13)  Senokot S 8.6-50 Mg Tabs (Sennosides-Docusate Sodium) .... Once Daily 14)  Muro 128 2 % Soln (Sodium Chloride (Hypertonic)) .... Qid 15)  Bepreve 1.5 % Soln (Bepotastine Besilate) .... 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 Gladis Riffle, RN (October 19, 2009 9:16 AM)  Past History:  Past Medical History: Last updated: 11/18/2007 Anemia-NOS Colon cancer, hx of GERD Hyperlipidemia Osteopenia chronic back pain  Past Surgical History: Last updated: 02/21/2008 Appendectomy 1950's Colectomy, partial 1994 shoulder surgery 04/02/00 enterocoele knee arthroscopy SBO--lysis of adhesion Back Surgery-2007 recurrent colon CA-2008 multiple ESI--back SBO-2009-did not require surgical intervention  Social History: Last updated: 02/09/2008 Married Never Smoked Regular exercise-no  Risk Factors: Exercise: no (02/09/2008)  Risk Factors: Smoking Status: never (10/19/2009)  Review of Systems       All other systems reviewed and were negative   Physical Exam  General:  Well-developed,well-nourished,in no acute distress; alert,appropriate and cooperative throughout examination Head:  normocephalic and atraumatic.   Eyes:  pupils equal  and pupils round.   Ears:  R ear normal and L ear normal.   Neck:  No deformities, masses, or tenderness noted. Chest Wall:  No deformities, masses, or tenderness noted. Lungs:  Normal respiratory effort, chest expands symmetrically. Lungs are clear to auscultation, no crackles or wheezes. Heart:  Normal rate and regular rhythm. S1 and S2 normal without gallop, murmur,  click, rub or other extra sounds. Abdomen:  Bowel sounds positive,abdomen soft and non-tender without masses, organomegaly or hernias noted. overweight Msk:  decreased abduction of right shoulder strength normal Pulses:  R radial normal and L radial normal.   Neurologic:  cranial nerves II-XII intact.  uses a cane for ambulation Skin:  turgor normal and color normal.   Psych:  memory intact for recent and remote and good eye contact.     Impression & Recommendations:  Problem # 1:  COUGH (ICD-786.2)  persistent check CXR and then make some decision  Orders: T-2 View CXR (71020TC)  Problem # 2:  FATIGUE (ICD-780.79)  unclear etiology---will check some labs  reviewed labs from [facility]  Orders: Venipuncture (04540) TLB-TSH (Thyroid Stimulating Hormone) (84443-TSH) TLB-BMP (Basic Metabolic Panel-BMET) (80048-METABOL) TLB-Hepatic/Liver Function Pnl (80076-HEPATIC) Sedimentation Rate, non-automated (98119)  Complete Medication List: 1)  Actonel 35 Mg Tabs (Risedronate sodium) .... One weekly 2)  Aspir-81 81 Mg Tbec (Aspirin) .... Take 1 tablet by mouth once a day 3)  Claritin 10 Mg Tabs (Loratadine) .... Take 1 tablet by mouth once a day 4)  Prevacid 15 Mg Cpdr (Lansoprazole) .... Once daily 5)  Caltrate 600+d 600-400 Mg-unit Tabs (Calcium carbonate-vitamin d) .... Two times a day 6)  Fish Oil 1200 Mg Caps (Omega-3 fatty acids) .... 2 once daily 7)  Multivitamins Tabs (Multiple vitamin) .... Once daily 8)  Vitamin C 500 Mg Tabs (Ascorbic acid) .... Once daily 9)  Vitamin D 1000 Unit Tabs (Cholecalciferol) .... Two times a day 10)  Stool Softener 100 Mg Caps (Docusate sodium) .... Two two times a day 11)  Methenamine Hippurate 1 Gm Tabs (Methenamine hippurate) .... Take 1 tablet by mouth once a day 12)  Metamucil 30.9 % Powd (Psyllium) .... One dose once daily 13)  Senokot S 8.6-50 Mg Tabs (Sennosides-docusate sodium) .... Once daily 14)  Muro 128 2 % Soln (Sodium chloride  (hypertonic)) .... Qid 15)  Bepreve 1.5 % Soln (Bepotastine besilate) .... Two times a day  Other Orders: T-Vitamin D (25-Hydroxy) [phone])

## 2011-01-30 NOTE — Assessment & Plan Note (Signed)
Summary: sharp pain in rib area/njr 11.30a   Vital Signs:  Patient Profile:   77 Years Old Female Temp:     98.1 degrees F oral Pulse rate:   68 / minute Pulse rhythm:   irregular BP sitting:   130 / 78  (left arm)  Vitals Entered By: Gladis Riffle, RN (November 29, 2007 11:42 AM)                 Chief Complaint:  c/o intermittent sharp pain under left ribs X 4 days, taking 3 Advil bid, and has not taken Vicodin.  History of Present Illness: Pt reports 5 day h/o pleuritic chest pain at left costal margin.  Pain is worse on deep inspriation or when laying prone or on her left side.  No h/o fever, cough, SOB,  trauma, or injury.  Current Allergies (reviewed today): * LIDODERM PATCH MORPHINE SULFATE (MORPHINE SULFATE) PENICILLIN G POTASSIUM (PENICILLIN G POTASSIUM) PHENERGAN (PROMETHAZINE HCL) * STATIN DRUGS  Past Medical History:    Reviewed history from 11/18/2007 and no changes required:       Anemia-NOS       Colon cancer, hx of       GERD       Hyperlipidemia       Osteopenia       chronic back pain  Past Surgical History:    Reviewed history from 11/18/2007 and no changes required:       Appendectomy 1950's       Colectomy, partial 1994       shoulder surgery 04/02/00       enterocoele       knee arthroscopy       SBO--lysis of adhesion       Back Surgery-2007       recurrent colon CA-2008       multiple ESI--back   Social History:    Reviewed history from 11/17/2006 and no changes required:       Married       Never Smoked    Review of Systems       no other complaints in a complete ROS    Physical Exam  General:     Well-developed,well-nourished,in no acute distress; alert,appropriate and cooperative throughout examination Head:     normocephalic and atraumatic.   Eyes:     pupils equal and pupils round.   Neck:     supple, full ROM, and no masses.   Chest Wall:     Tender to palpation at L costal margin both anteriorly and  posteriorly. Lungs:     Crackles evident at LLL.  Dullness to percussion at LLL.  Otherwise a normal lung exam.    Impression & Recommendations:  Problem # 1:  CHEST PAIN UNSPECIFIED (ICD-786.50) Given patient h/o recurrent colon cancer, this is concerning for a possible metastatic process.  Alternately, pain could be MSK in orgin and lung findings may be due to atelectasis secondary to shallow breathing.  Will get a CXR to evaluate.  Recommended ibuprofen 600mg  three times a day as needed fo pain.  Orders: T-2 View CXR, Same Day (71020.5TC)   Complete Medication List: 1)  Actonel 35 Mg Tabs (Risedronate sodium) .... One weekly 2)  Aspir-81 81 Mg Tbec (Aspirin) .... Take 1 tablet by mouth once a day 3)  Claritin 10 Mg Tabs (Loratadine) .... Take 1 tablet by mouth once a day 4)  Lyrica 50 Mg Caps (Pregabalin) .... Take 1 capsule by  mouth twice a day 5)  Nitrofurantoin Monohyd Macro 100 Mg Caps (Nitrofurantoin monohyd macro) .Marland Kitchen.. 1 once a day 6)  Prevacid 30 Mg Cpdr (Lansoprazole) .... One daily 7)  Vicodin 5-500 Mg Tabs (Hydrocodone-acetaminophen) .... 1/2-1 tablets two times a day 8)  Xeloda 500 Mg Tabs (Capecitabine) .... Two two times a day 9)  Caltrate 600+d 600-400 Mg-unit Tabs (Calcium carbonate-vitamin d) .... Two times a day 10)  Premarin 0.625 Mg/gm Crea (Estrogens, conjugated) .... 3 x week 11)  Fish Oil 1200 Mg Caps (Omega-3 fatty acids) .... 2 once daily 12)  Vitamin B-6 50 Mg Tabs (Pyridoxine hcl) .... Once daily 13)  Multivitamins Tabs (Multiple vitamin) .... Once daily     ]

## 2011-01-30 NOTE — Progress Notes (Signed)
Summary: vicodin    Prescriptions: VICODIN 5-500 MG TABS (HYDROCODONE-ACETAMINOPHEN) Take 1-2 tablet by mouth every four to six hours  #100 x 0   Entered by:   Lynann Beaver CMA   Authorized by:   Birdie Sons MD   Signed by:   Lynann Beaver CMA on 09/03/2007   Method used:   Telephoned to ...         RxID:   [id number]  [facility]

## 2011-01-30 NOTE — Letter (Signed)
[facility]   Imported By: Sherian Rein 05/30/2010 15:47:09  _____________________________________________________________________  External Attachment:    Type:   Image     Comment:   External Document

## 2011-01-30 NOTE — Progress Notes (Signed)
Summary: new rx  Phone Note Call from Patient Call back at [phone]   Caller: patient husband Call For: swords Summary of Call: Patient wants a new rx for previcde 30 mg for her mail order rs, please call when ready to pickup.  [facility] mail order. would like to pickup monday 02-28-08 her husband.  90 days supply Initial call taken by: Celine Ahr,  February 24, 2008 9:39 AM  Follow-up for Phone Call        Rx written and will be put at front desk for pick up. Husband had appt today and was to pick up Rx. Follow-up by: Gladis Riffle, RN,  February 29, 2008 4:59 PM      Prescriptions: PREVACID 30 MG CPDR (LANSOPRAZOLE) one daily  #90 x 4   Entered by:   Gladis Riffle, RN   Authorized by:   Birdie Sons MD   Signed by:   Gladis Riffle, RN on 02/24/2008   Method used:   Print then Give to Patient   RxID:   [id number]

## 2011-01-30 NOTE — Letter (Signed)
Summary: [facility]  [facility]   Imported By: Sherian Rein 04/04/2009 10:51:20  _____________________________________________________________________  External Attachment:    Type:   Image     Comment:   External Document

## 2011-01-30 NOTE — Letter (Signed)
Summary: [facility]  [facility]   Imported By: Maryln Gottron 09/29/2008 12:53:54  _____________________________________________________________________  External Attachment:    Type:   Image     Comment:   External Document

## 2011-01-30 NOTE — Letter (Signed)
Summary: [facility]  [facility]   Imported By: Lester Harold 12/20/2009 10:56:39  _____________________________________________________________________  External Attachment:    Type:   Image     Comment:   External Document

## 2011-01-30 NOTE — Assessment & Plan Note (Signed)
Summary: BURNING SENSATION IN STOMACH FOR OVER A WEEK//CCM   Vital Signs:  Patient profile:   77 year old female Weight:      139 pounds Temp:     98.2 degrees F oral Pulse rate:   60 / minute Pulse rhythm:   regular Resp:     12 per minute BP sitting:   144 / 72  (left arm) Cuff size:   regular  Vitals Entered By: Gladis Riffle, RN (March 04, 2010 1:01 PM) CC: c/o body warm sensation; difficulty with bowels and has back pain that is relieved with BM Is Patient Diabetic? No   CC:  c/o body warm sensation; difficulty with bowels and has back pain that is relieved with BM.  History of Present Illness: acute  complaints she describes a burning sensation all over body...worse prior to having a  bowel movement. She has a BM daily with help of laxatives---uses suppository occasionally (helps prevent her from straining).  She has a complicated GI hx with colon CA and surgeries. She reports that she is due for a virtual colonoscopy soon.  New meds: states she has been taking tramodol for 1 month.   All other systems reviewed and were negative   Preventive Screening-Counseling & Management  Alcohol-Tobacco     Smoking Status: never  Current Problems (verified): 1)  Low Back Pain, Chronic  (ICD-724.2) 2)  Adenocarcinoma, Colon  (ICD-153.9) 3)  Osteopenia  (ICD-733.90) 4)  Hyperlipidemia  (ICD-272.4) 5)  Gerd  (ICD-530.81) 6)  Anemia-nos  (ICD-285.9)  Current Medications (verified): 1)  Actonel 35 Mg Tabs (Risedronate Sodium) .... One Weekly 2)  Aspir-81 81 Mg Tbec (Aspirin) .... Take 1 Tablet By Mouth Once A Day 3)  Claritin 10 Mg Tabs (Loratadine) .... Take 1 Tablet By Mouth Once A Day 4)  Prevacid 15 Mg Cpdr (Lansoprazole) .... Once Daily 5)  Fish Oil 1200 Mg  Caps (Omega-3 Fatty Acids) .... 2 Once Daily 6)  Multivitamins   Tabs (Multiple Vitamin) .... Once Daily 7)  Vitamin C 500 Mg  Tabs (Ascorbic Acid) .... Once Daily 8)  Vitamin D 1000 Unit Tabs (Cholecalciferol) .... Two  Times A Day 9)  Stool Softener 100 Mg  Caps (Docusate Sodium) .... Two Two Times A Day 10)  Methenamine Hippurate 1 Gm  Tabs (Methenamine Hippurate) .... Take 1 Tablet By Mouth Once A Day 11)  Metamucil 30.9 %  Powd (Psyllium) .... One Dose Once Daily 12)  Senokot S 8.6-50 Mg Tabs (Sennosides-Docusate Sodium) .... Four Tablets Once Daily 13)  Muro 128 2 % Soln (Sodium Chloride (Hypertonic)) .... Qid 14)  Bepreve 1.5 % Soln (Bepotastine Besilate) .... Two Times A Day 15)  Calcium Citrate 600mg  .... Two Times A Day 16)  Ferrous Sulfate 325 (65 Fe) Mg Tabs (Ferrous Sulfate) .... Once Daily 17)  Tramadol Hcl 50 Mg Tabs (Tramadol Hcl) .... Take 1 Tablet By Mouth Three Times A Day As Needed Back Pain  Allergies: 1)  * Lidoderm Patch 2)  Morphine Sulfate (Morphine Sulfate) 3)  Penicillin G Potassium (Penicillin G Potassium) 4)  Phenergan (Promethazine Hcl) 5)  * Statin Drugs  Past History:  Past Medical History: Last updated: 11/18/2007 Anemia-NOS Colon cancer, hx of GERD Hyperlipidemia Osteopenia chronic back pain  Past Surgical History: Last updated: 02/21/2008 Appendectomy 1950's Colectomy, partial 1994 shoulder surgery 04/02/00 enterocoele knee arthroscopy SBO--lysis of adhesion Back Surgery-2007 recurrent colon CA-2008 multiple ESI--back SBO-2009-did not require surgical intervention  Social History: Last updated: 02/09/2008 Married  Never Smoked Regular exercise-no  Risk Factors: Exercise: no (02/09/2008)  Risk Factors: Smoking Status: never (03/04/2010)  Physical Exam  General:  alert and well-developed.  tearful at times.  HEENT exam atraumatic, normocephalic symmetric muscles are intact.  There is no icterus.  Neck is supple without lymphadenopathy or thyromegaly.  Chest is clear to auscultation without increased work of breathing.  Cardiac exam S1-S2 are normal without murmurs or gallops.  Abdominal exam active bowel sounds, soft and nontender there is no  hepatomegaly.  Extremities there is no clubbing cyanosis or edema.  Dermatologic exam no significant rashes.  Neurologic exam she is alert and oriented.  Psych: She is alert and engaged.  Tearful at times.   Impression & Recommendations:  Problem # 1:  LOW BACK PAIN, CHRONIC (ICD-724.2) I think many of her complaints are related to use of tramadol.  I suspect she is having a serotonin-type reaction.  Discontinue tramadol.  We'll add low dose hydrocodone.  She has tried this before and has had trouble with sleepiness.  I've asked her to start with a low dose of hydrocodone. side effects discussed. The following medications were removed from the medication list:    Tramadol Hcl 50 Mg Tabs (Tramadol hcl) .Marland Kitchen... Take 1 tablet by mouth three times a day as needed back pain Her updated medication list for this problem includes:    Aspir-81 81 Mg Tbec (Aspirin) .Marland Kitchen... Take 1 tablet by mouth once a day    Hydrocodone-acetaminophen 7.5-500 Mg/44ml Soln (Hydrocodone-acetaminophen) .Marland Kitchen... 1 tsp by mouth three times a day as needed for pain  Complete Medication List: 1)  Actonel 35 Mg Tabs (Risedronate sodium) .... One weekly 2)  Aspir-81 81 Mg Tbec (Aspirin) .... Take 1 tablet by mouth once a day 3)  Claritin 10 Mg Tabs (Loratadine) .... Take 1 tablet by mouth once a day 4)  Prevacid 15 Mg Cpdr (Lansoprazole) .... Once daily 5)  Fish Oil 1200 Mg Caps (Omega-3 fatty acids) .... 2 once daily 6)  Vitamin D 1000 Unit Tabs (Cholecalciferol) .... Two times a day 7)  Stool Softener 100 Mg Caps (Docusate sodium) .... Two two times a day 8)  Methenamine Hippurate 1 Gm Tabs (Methenamine hippurate) .... Take 1 tablet by mouth once a day 9)  Metamucil 30.9 % Powd (Psyllium) .... One dose once daily 10)  Senokot S 8.6-50 Mg Tabs (Sennosides-docusate sodium) .... Four tablets once daily 11)  Muro 128 2 % Soln (Sodium chloride (hypertonic)) .... Qid 12)  Bepreve 1.5 % Soln (Bepotastine besilate) .... Two times a  day 13)  Calcium Citrate 600mg   .... Two times a day 14)  Ferrous Sulfate 325 (65 Fe) Mg Tabs (Ferrous sulfate) .... Once daily 15)  Hydrocodone-acetaminophen 7.5-500 Mg/52ml Soln (Hydrocodone-acetaminophen) .Marland Kitchen.. 1 tsp by mouth three times a day as needed for pain  Patient Instructions: 1)  see me 3 weeks Prescriptions: HYDROCODONE-ACETAMINOPHEN 7.5-500 MG/15ML SOLN (HYDROCODONE-ACETAMINOPHEN) 1 tsp by mouth three times a day as needed for pain  #240 cc x 1   Entered and Authorized by:   Birdie Sons MD   Signed by:   Birdie Sons MD on 03/04/2010   Method used:   Print then Give to Patient   RxID:   [id number]

## 2011-01-30 NOTE — Op Note (Signed)
Summary: Small bowel resection   NAME:  [name] Barker, [name] Barker           ACCOUNT NO.:  [id number]   MEDICAL RECORD NO.:  [id number]          PATIENT TYPE:  INP   LOCATION:  0449                         FACILITY:  [facility]   PHYSICIAN:  Lorre Munroe., M.D.DATE OF BIRTH:  07-16-1921   DATE OF PROCEDURE:  02/02/2005  DATE OF DISCHARGE:                                 OPERATIVE REPORT   PREOPERATIVE DIAGNOSIS:  Small-bowel obstruction.   POSTOPERATIVE DIAGNOSIS:  Closed loop small-bowel obstruction with  infarction of segment of small bowel.   OPERATION:  Lysis of adhesions, small bowel resection with anastomosis.   SURGEON:  Lebron Conners, M.D.   ASSISTANT:  Velora Heckler, M.D.   ANESTHESIA:  General.   PROCEDURE:  After the patient was monitored and anesthetized and had a Foley  catheter and routine preparation and draping of the abdomen, I opened a  portion of the previous long midline incision.  I carefully entered the  abdomen and noted that just below the umbilicus there was a cribriform very  small ventral hernia present.  I very carefully took down the contents of  the hernia as I entered the abdomen.  The hernia contained only omentum and  preperitoneal fat.  I then entered the abdominal cavity and took down the  adherent omentum to the anterior abdominal wall and noted dilated small  bowel and noted some dark, slightly bloody, turbid fluid, mostly in the  right side of the abdomen.  There was a palpable mass in the right side of  the abdomen.  I took down the omentum all the way around so as to be able to  completely explore the small bowel and found that the small bowel was  relatively free of adhesions.  The colon had some gas in it and looked  normal.  The stomach had a nasogastric tube in it, and I assisted the  anesthetist in positioning that optimally.  I sucked out the fluid which was  present.  I then mobilized the small bowel out of the right side of the  abdomen  and noted that there was a very dark, obviously infarcted loop of  small intestine caused by herniation through an area of adhesion which was  very tight.  I then lysed that adhesion and freed the small bowel up, but it  did not regain a viable appearance, and with the odor and dark fluid, I was  sure it was infarcted.  I brought the distal and proximal small bowel  together near the infarcted segment and did a side-to-side anastomosis with  the GIA stapler and then divided the bowel across that staple line with a  linear stapler.  I then resected the dead portion of bowel, clamping the  mesentery with Kelly clamps and ligating or suture ligating the vessels with  2-0 silk.  The bowel appeared viable, and the anastomosis was widely patent  and appeared secure.  I closed the mesenteric defect with a single figure-of-  eight stitch of 2-0 silk, and I placed one reinforcing 2-0 silk stitch in  the seromuscular layers of the bowel  at the crotch of the anastomosis.  I  then inspected the entire small bowel from the ligament of Treitz down to  the anastomosis of the transverse colon to the small bowel.  No more  obstructions were found.  I did lyse a few adhesions.  I then used my  fingers to milk the gas and fluid in the proximal small bowel back into the  stomach where it was removed through the nasogastric tube.  I then copiously  irrigated the abdominal cavity and removed some of the exudate which was  present and removed the fluid.  I got good hemostasis on the omentum and  other areas.  The sponge, needle, and instrument counts were correct.  I  again inspected the anastomosis and felt that it looked fine.  I closed the  fascia with running #1 PDS suture beginning at  both ends and tying with a simple knot in the middle.  I did mobilize the  subcutaneous tissues slightly in the area of the hernia to facilitate  closure.  I closed the skin loosely with staples and packed gauze between  the  interstices to provide subcutaneous drainage.  I applied a bulky  compressive bandage.  She was stable throughout the case.      WB/MEDQ  D:  02/02/2005  T:  02/02/2005  Job:  [id number]   cc:   Valetta Mole. Swords, M.D. [facility]

## 2011-01-30 NOTE — Assessment & Plan Note (Signed)
Summary: 2 MONTH ROV/NJR   Vital Signs:  Patient profile:   77 year old female Weight:      135 pounds BMI:     27.37 Temp:     98.5 degrees F oral Pulse rate:   64 / minute Pulse rhythm:   irregular Resp:     12 per minute BP sitting:   114 / 70  (left arm) Cuff size:   regular  Vitals Entered By: [name] Riffle, RN (June 21, 2010 10:02 AM) CC: 2 month rov Is Patient Diabetic? No   Primary Care Provider:  Birdie Sons, MD  CC:  2 month rov.  History of Present Illness:  Follow-Up Visit      This is an 77 year old woman who presents for Follow-up visit.  The patient denies chest pain and palpitations.  Since the last visit the patient notes no new problems or concerns.  The patient reports taking meds as prescribed.  When questioned about possible medication side effects, the patient notes none.  reviewed virtual colonoscopy---note coronary artery calcifications.  All other systems reviewed and were negative --- continued back pain---controlled with current pain meds  Preventive Screening-Counseling & Management  Alcohol-Tobacco     Smoking Status: never  Current Problems (verified): 1)  Diverticulosis-colon  (ICD-562.10) 2)  Constipation  (ICD-564.00) 3)  Personal Hx Colon Cancer  (ICD-V10.05) 4)  Low Back Pain, Chronic  (ICD-724.2) 5)  Adenocarcinoma, Colon  (ICD-153.9) 6)  Osteopenia  (ICD-733.90) 7)  Hyperlipidemia  (ICD-272.4) 8)  Gerd  (ICD-530.81) 9)  Anemia-nos  (ICD-285.9)  Current Medications (verified): 1)  Actonel 35 Mg Tabs (Risedronate Sodium) .... One Weekly 2)  Aspir-81 81 Mg Tbec (Aspirin) .... Take 1 Tablet By Mouth Once A Day 3)  Claritin 10 Mg Tabs (Loratadine) .... Take 1 Tablet By Mouth Once A Day 4)  Prevacid 15 Mg Cpdr (Lansoprazole) .... Once Daily 5)  Fish Oil 1200 Mg  Caps (Omega-3 Fatty Acids) .... 2 Once Daily 6)  Vitamin D 1000 Unit Tabs (Cholecalciferol) .... Two Times A Day 7)  Stool Softener 100 Mg  Caps (Docusate Sodium) .... Two Two  Times A Day 8)  Metamucil 30.9 %  Powd (Psyllium) .... One Dose Once Daily 9)  Senokot S 8.6-50 Mg Tabs (Sennosides-Docusate Sodium) .... Two Tablets Once Daily 10)  Muro 128 2 % Soln (Sodium Chloride (Hypertonic)) .... Qid 11)  Bepreve 1.5 % Soln (Bepotastine Besilate) .... Two Times A Day 12)  Calcium Citrate 600mg  .... Two Times A Day 13)  Ferrous Sulfate 325 (65 Fe) Mg Tabs (Ferrous Sulfate) .... Once Daily 14)  Hydrocodone-Acetaminophen 7.5-500 Mg/60ml Soln (Hydrocodone-Acetaminophen) .Marland Kitchen.. 1 Tsp By Mouth Three Times A Day As Needed For Pain 15)  Proair Hfa 108 (90 Base) Mcg/act Aers (Albuterol Sulfate) .... Use Once A Day As Needed 16)  Premarin 0.625 Mg/gm Crea (Estrogens, Conjugated) .... Use Vaginally Daily 17)  Multivitamins  Tabs (Multiple Vitamin) .... Take One By Mouth Once Daily 18)  Miralax  Powd (Polyethylene Glycol 3350) .... Take One Capful Daily Mixed in 8 Ounce of Water  Allergies: 1)  ! Methenamine Mandelate (Methenamine Mandelate) 2)  * Lidoderm Patch 3)  Morphine Sulfate (Morphine Sulfate) 4)  Penicillin G Potassium (Penicillin G Potassium) 5)  Phenergan (Promethazine Hcl) 6)  * Statin Drugs  Physical Exam  General:  alert and well-developed.   Head:  normocephalic and atraumatic.   Eyes:  pupils equal and pupils round.   Ears:  R ear normal  and L ear normal.   Neck:  No deformities, masses, or tenderness noted. Chest Wall:  No deformities, masses, or tenderness noted. Lungs:  normal respiratory effort and no intercostal retractions.   Heart:  normal rate and regular rhythm.   Abdomen:  soft and non-tender.   Skin:  turgor normal and color normal.   Psych:  normally interactive and good eye contact.     Impression & Recommendations:  Problem # 1:  LOW BACK PAIN, CHRONIC (ICD-724.2) continue current medications  reasonable control Her updated medication list for this problem includes:    Aspir-81 81 Mg Tbec (Aspirin) .Marland Kitchen... Take 1 tablet by mouth once a  day    Hydrocodone-acetaminophen 7.5-500 Mg/25ml Soln (Hydrocodone-acetaminophen) .Marland Kitchen... 1 tsp by mouth three times a day as needed for pain  Problem # 2:  CONSTIPATION (ICD-564.00) doing reasonably well  Her updated medication list for this problem includes:    Stool Softener 100 Mg Caps (Docusate sodium) .Marland Kitchen..Marland Kitchen Two two times a day    Metamucil 30.9 % Powd (Psyllium) ..... One dose once daily    Senokot S 8.6-50 Mg Tabs (Sennosides-docusate sodium) .Marland Kitchen..Marland Kitchen Two tablets once daily    Miralax Powd (Polyethylene glycol 3350) .Marland Kitchen... Take one capful daily mixed in 8 ounce of water  Problem # 3:  HYPERLIPIDEMIA (ICD-272.4)  no need for eval Labs Reviewed: SGOT: 26 (10/19/2009)   SGPT: 16 (10/19/2009)  Lipid Goals: Chol Goal: 200 (12/08/2006)   HDL Goal: 40 (12/08/2006)   LDL Goal: 130 (12/08/2006)   TG Goal: 150 (12/08/2006)  Prior 10 Yr Risk Heart Disease: Not enough information (12/08/2006)  Orders: TLB-Lipid Panel (80061-LIPID) TLB-Hepatic/Liver Function Pnl (80076-HEPATIC) TLB-TSH (Thyroid Stimulating Hormone) (84443-TSH)  Problem # 4:  ADENOCARCINOMA, COLON (ICD-153.9)  reviewed virtual colonoscopy  Orders: TLB-CBC Platelet - w/Differential (85025-CBCD)  Problem # 5:  CAD (ICD-414.00)  Her updated medication list for this problem includes:    Aspir-81 81 Mg Tbec (Aspirin) .Marland Kitchen... Take 1 tablet by mouth once a day  Orders: Venipuncture (40981)  Complete Medication List: 1)  Actonel 35 Mg Tabs (Risedronate sodium) .... One weekly 2)  Aspir-81 81 Mg Tbec (Aspirin) .... Take 1 tablet by mouth once a day 3)  Claritin 10 Mg Tabs (Loratadine) .... Take 1 tablet by mouth once a day 4)  Prevacid 15 Mg Cpdr (Lansoprazole) .... Once daily 5)  Fish Oil 1200 Mg Caps (Omega-3 fatty acids) .... 2 once daily 6)  Vitamin D 1000 Unit Tabs (Cholecalciferol) .... Two times a day 7)  Stool Softener 100 Mg Caps (Docusate sodium) .... Two two times a day 8)  Metamucil 30.9 % Powd (Psyllium) ....  One dose once daily 9)  Senokot S 8.6-50 Mg Tabs (Sennosides-docusate sodium) .... Two tablets once daily 10)  Muro 128 2 % Soln (Sodium chloride (hypertonic)) .... Qid 11)  Bepreve 1.5 % Soln (Bepotastine besilate) .... Two times a day 12)  Calcium Citrate 600mg   .... Two times a day 13)  Ferrous Sulfate 325 (65 Fe) Mg Tabs (Ferrous sulfate) .... Once daily 14)  Hydrocodone-acetaminophen 7.5-500 Mg/35ml Soln (Hydrocodone-acetaminophen) .Marland Kitchen.. 1 tsp by mouth three times a day as needed for pain 15)  Proair Hfa 108 (90 Base) Mcg/act Aers (Albuterol sulfate) .... Use once a day as needed 16)  Premarin 0.625 Mg/gm Crea (Estrogens, conjugated) .... Use vaginally daily 17)  Multivitamins Tabs (Multiple vitamin) .... Take one by mouth once daily 18)  Miralax Powd (Polyethylene glycol 3350) .... Take one capful daily mixed in  8 ounce of water  Appended Document: Orders Update    Clinical Lists Changes  Orders: Added new Referral order of Pain Clinic Referral (Pain) - Signed

## 2011-01-30 NOTE — Progress Notes (Signed)
Summary: refill  Phone Note From Pharmacy Call back at [phone]   [facility] [address] Call For: swords  Summary of Call: hydrocodone apap 5-500 tablmck 100 take 1/2 to 1 tablet by mouth two times a day an  last fill 9 5 08 Initial call taken by: Roselle Locus,  October 27, 2007 10:08 AM    New/Updated Medications: VICODIN 5-500 MG TABS (HYDROCODONE-ACETAMINOPHEN) 1/2-1 tablets two times a day   Prescriptions: VICODIN 5-500 MG TABS (HYDROCODONE-ACETAMINOPHEN) 1/2-1 tablets two times a day  #30 x 0   Entered by:   Gladis Riffle, RN   Authorized by:   Birdie Sons MD   Signed by:   Gladis Riffle, RN on 10/27/2007   Method used:   Telephoned to ...       [facility]  [phone]*       [address]       [place], [place]  [address]       Ph: [phone] or [phone]       Fax: [phone]   RxID:   [id number]

## 2011-01-30 NOTE — Letter (Signed)
Summary: [facility]  [facility]   Imported By: Kassie Mends 12/14/2007 10:13:06  _____________________________________________________________________  External Attachment:    Type:   Image     Comment:   [facility] note

## 2011-01-30 NOTE — Assessment & Plan Note (Signed)
Summary: ROA/FUP/RDD   Vital Signs:  Patient profile:   77 year old female Weight:      138 pounds Temp:     97.9 degrees F Pulse rate:   72 / minute Pulse rhythm:   irregular Resp:     12 per minute BP sitting:   140 / 78  (left arm)  Vitals Entered By: Gladis Riffle, RN (February 04, 2010 8:36 AM) CC: rov, labs done Is Patient Diabetic? No Comments BP 135/65 at home this AM   CC:  rov and labs done.  History of Present Illness:  Follow-Up Visit      This is an 77 year old woman who presents for Follow-up visit.  The patient denies chest pain and palpitations.  Since the last visit the patient notes no new problems or concerns.  The patient reports taking meds as prescribed.  When questioned about possible medication side effects, the patient notes none.    All other systems reviewed and were negative -- chronic back pain---followed by dr Ethelene Hal  Preventive Screening-Counseling & Management  Alcohol-Tobacco     Smoking Status: never  Current Problems (verified): 1)  Low Back Pain, Chronic  (ICD-724.2) 2)  Adenocarcinoma, Colon  (ICD-153.9) 3)  Osteopenia  (ICD-733.90) 4)  Hyperlipidemia  (ICD-272.4) 5)  Gerd  (ICD-530.81) 6)  Anemia-nos  (ICD-285.9)  Current Medications (verified): 1)  Actonel 35 Mg Tabs (Risedronate Sodium) .... One Weekly 2)  Aspir-81 81 Mg Tbec (Aspirin) .... Take 1 Tablet By Mouth Once A Day 3)  Claritin 10 Mg Tabs (Loratadine) .... Take 1 Tablet By Mouth Once A Day 4)  Prevacid 15 Mg Cpdr (Lansoprazole) .... Once Daily 5)  Vitamin D 1000 Unit Tabs (Cholecalciferol) .... Once Daily 6)  Fish Oil 1200 Mg  Caps (Omega-3 Fatty Acids) .... 2 Once Daily 7)  Multivitamins   Tabs (Multiple Vitamin) .... Once Daily 8)  Vitamin C 500 Mg  Tabs (Ascorbic Acid) .... Once Daily 9)  Vitamin D 1000 Unit Tabs (Cholecalciferol) .... Two Times A Day 10)  Stool Softener 100 Mg  Caps (Docusate Sodium) .... Two Two Times A Day 11)  Methenamine Hippurate 1 Gm  Tabs  (Methenamine Hippurate) .... Take 1 Tablet By Mouth Once A Day 12)  Metamucil 30.9 %  Powd (Psyllium) .... One Dose Once Daily 13)  Senokot S 8.6-50 Mg Tabs (Sennosides-Docusate Sodium) .... Four Tablets Once Daily 14)  Muro 128 2 % Soln (Sodium Chloride (Hypertonic)) .... Qid 15)  Bepreve 1.5 % Soln (Bepotastine Besilate) .... Two Times A Day 16)  Calcium Citrate 600mg  .... Two Times A Day 17)  Ferrous Sulfate 325 (65 Fe) Mg Tabs (Ferrous Sulfate) .... Once Daily 18)  Tramadol Hcl 50 Mg Tabs (Tramadol Hcl) .... Take 1 Tablet By Mouth Three Times A Day As Needed Back Pain  Allergies: 1)  * Lidoderm Patch 2)  Morphine Sulfate (Morphine Sulfate) 3)  Penicillin G Potassium (Penicillin G Potassium) 4)  Phenergan (Promethazine Hcl) 5)  * Statin Drugs  Review of Systems       All other systems reviewed and were negative   Physical Exam  General:  Well-developed,well-nourished,in no acute distress; alert,appropriate and cooperative throughout examination Head:  normocephalic and atraumatic.   Eyes:  pupils equal and pupils round.   Ears:  R ear normal and L ear normal.   Neck:  No deformities, masses, or tenderness noted. Chest Wall:  No deformities, masses, or tenderness noted. Lungs:  Normal respiratory effort,  chest expands symmetrically. Lungs are clear to auscultation, no crackles or wheezes. Heart:  Normal rate and regular rhythm. S1 and S2 normal without gallop, murmur, click, rub or other extra sounds. Abdomen:  Bowel sounds positive,abdomen soft and non-tender without masses, organomegaly or hernias noted. overweight Msk:  decreased abduction of right shoulder strength normal Pulses:  R radial normal and L radial normal.   Extremities:  No clubbing, cyanosis, edema, or deformity noted  Neurologic:  cranial nerves II-XII intact.  uses a cane for ambulation Skin:  turgor normal and color normal.   Psych:  normally interactive and good eye contact.     Impression &  Recommendations:  Problem # 1:  LOW BACK PAIN, CHRONIC (ICD-724.2) much better controlled Her updated medication list for this problem includes:    Aspir-81 81 Mg Tbec (Aspirin) .Marland Kitchen... Take 1 tablet by mouth once a day    Tramadol Hcl 50 Mg Tabs (Tramadol hcl) .Marland Kitchen... Take 1 tablet by mouth three times a day as needed back pain  Problem # 2:  HYPERLIPIDEMIA (ICD-272.4) no need for meds Labs Reviewed: SGOT: 26 (10/19/2009)   SGPT: 16 (10/19/2009)  Lipid Goals: Chol Goal: 200 (12/08/2006)   HDL Goal: 40 (12/08/2006)   LDL Goal: 130 (12/08/2006)   TG Goal: 150 (12/08/2006)  Prior 10 Yr Risk Heart Disease: Not enough information (12/08/2006)  Problem # 3:  GERD (ICD-530.81) controlled continue current medications  Her updated medication list for this problem includes:    Prevacid 15 Mg Cpdr (Lansoprazole) ..... Once daily  Problem # 4:  ANEMIA-NOS (ICD-285.9)  stable and improved Her updated medication list for this problem includes:    Ferrous Sulfate 325 (65 Fe) Mg Tabs (Ferrous sulfate) ..... Once daily  Hgb: 11.3 (02/21/2008)   Hct: 34.7 (02/21/2008)   Platelets: 301 (02/21/2008) RBC: 3.64 (02/21/2008)   RDW: 12.4 (02/21/2008)   WBC: 8.1 (02/21/2008) MCV: 95.3 (02/21/2008)   MCHC: 32.6 (02/21/2008) TSH: 4.53 (10/19/2009)  Problem # 5:  ADENOCARCINOMA, COLON (ICD-153.9) no recurrence  Complete Medication List: 1)  Actonel 35 Mg Tabs (Risedronate sodium) .... One weekly 2)  Aspir-81 81 Mg Tbec (Aspirin) .... Take 1 tablet by mouth once a day 3)  Claritin 10 Mg Tabs (Loratadine) .... Take 1 tablet by mouth once a day 4)  Prevacid 15 Mg Cpdr (Lansoprazole) .... Once daily 5)  Vitamin D 1000 Unit Tabs (Cholecalciferol) .... Once daily 6)  Fish Oil 1200 Mg Caps (Omega-3 fatty acids) .... 2 once daily 7)  Multivitamins Tabs (Multiple vitamin) .... Once daily 8)  Vitamin C 500 Mg Tabs (Ascorbic acid) .... Once daily 9)  Vitamin D 1000 Unit Tabs (Cholecalciferol) .... Two times a  day 10)  Stool Softener 100 Mg Caps (Docusate sodium) .... Two two times a day 11)  Methenamine Hippurate 1 Gm Tabs (Methenamine hippurate) .... Take 1 tablet by mouth once a day 12)  Metamucil 30.9 % Powd (Psyllium) .... One dose once daily 13)  Senokot S 8.6-50 Mg Tabs (Sennosides-docusate sodium) .... Four tablets once daily 14)  Muro 128 2 % Soln (Sodium chloride (hypertonic)) .... Qid 15)  Bepreve 1.5 % Soln (Bepotastine besilate) .... Two times a day 16)  Calcium Citrate 600mg   .... Two times a day 17)  Ferrous Sulfate 325 (65 Fe) Mg Tabs (Ferrous sulfate) .... Once daily 18)  Tramadol Hcl 50 Mg Tabs (Tramadol hcl) .... Take 1 tablet by mouth three times a day as needed back pain  Patient Instructions: 1)  Please schedule a follow-up appointment in 3 months.

## 2011-01-30 NOTE — Procedures (Signed)
Summary: Colonoscopy   colonoscopy report   Imported By: Kassie Mends 08/31/2007 13:51:47  _____________________________________________________________________  External Attachment:    Type:   Image     Comment:   colonoscopy report

## 2011-01-30 NOTE — Procedures (Signed)
Summary: EGD/[facility]  EGD/[facility]   Imported By: Sherian Rein 05/30/2010 15:50:31  _____________________________________________________________________  External Attachment:    Type:   Image     Comment:   External Document

## 2011-01-30 NOTE — Assessment & Plan Note (Signed)
Summary: 3 MONTH FUP/CCM   Vital Signs:  Patient Profile:   77 Years Old Female Weight:      135 pounds Temp:     97.9 degrees F Pulse rate:   68 / minute Resp:     12 per minute BP sitting:   122 / 75  Vitals Entered By: Gladis Riffle, RN (August 15, 2008 9:35 AM)                 Chief Complaint:  3 month rov--discuss premarin--states doing PT for back.  History of Present Illness: Current Problems:  UTI'S, RECURRENT (ICD-599.0)---no sxs SMALL BOWEL OBSTRUCTION (ICD-560.9)---no recurrence ADENOCARCINOMA, COLON (ICD-153.9)---no known recurrence BACK PAIN (ICD-724.5)---has seene dr poole---now participating in PT  Past Medical History: Anemia-NOS Colon cancer, hx of GERD Hyperlipidemia Osteopenia chronic back pain Past Surgical History: Appendectomy 1950's Colectomy, partial 1994 shoulder surgery 04/02/00 enterocoele knee arthroscopy SBO--lysis of adhesion Back Surgery-2007 recurrent colon CA-2008 multiple ESI--back SBO-2009-did not require surgical intervention  Social History: Married Never Smoked Regular exercise-no  Family History:   no other complaints in a complete ROS       Updated Prior Medication List: ACTONEL 35 MG TABS (RISEDRONATE SODIUM) one weekly ASPIR-81 81 MG TBEC (ASPIRIN) Take 1 tablet by mouth once a day CLARITIN 10 MG TABS (LORATADINE) Take 1 tablet by mouth once a day PREVACID 30 MG CPDR (LANSOPRAZOLE) one daily CALTRATE 600+D 600-400 MG-UNIT  TABS (CALCIUM CARBONATE-VITAMIN D) two times a day PREMARIN 0.625 MG/GM  CREA (ESTROGENS, CONJUGATED) 3 X week FISH OIL 1200 MG  CAPS (OMEGA-3 FATTY ACIDS) 2 once daily MULTIVITAMINS   TABS (MULTIPLE VITAMIN) once daily VITAMIN C 500 MG  TABS (ASCORBIC ACID) once daily VITAMIN D 400 UNIT  TABS (CHOLECALCIFEROL) once daily STOOL SOFTENER 100 MG  CAPS (DOCUSATE SODIUM) once daily HYDROCODONE-ACETAMINOPHEN 5-500 MG  TABS (HYDROCODONE-ACETAMINOPHEN) take 1/2-1 tablet two times a day METHENAMINE  HIPPURATE 1 GM  TABS (METHENAMINE HIPPURATE) Take 1 tablet by mouth once a day METAMUCIL 30.9 %  POWD (PSYLLIUM) one dose once daily  Current Allergies (reviewed today): * LIDODERM PATCH MORPHINE SULFATE (MORPHINE SULFATE) PENICILLIN G POTASSIUM (PENICILLIN G POTASSIUM) PHENERGAN (PROMETHAZINE HCL) * STATIN DRUGS      Physical Exam  General:     Well-developed,well-nourished,in no acute distress; alert,appropriate and cooperative throughout examination Head:     normocephalic and atraumatic.   Eyes:     pupils equal and pupils round.   Ears:     R ear normal and L ear normal.   Nose:     no external deformity and no external erythema.   Neck:     No deformities, masses, or tenderness noted. Chest Wall:     No deformities, masses, or tenderness noted. Lungs:     Normal respiratory effort, chest expands symmetrically. Lungs are clear to auscultation, no crackles or wheezes. Heart:     Normal rate and regular rhythm. S1 and S2 normal without gallop, murmur, click, rub or other extra sounds. Abdomen:     Bowel sounds positive,abdomen soft and non-tender without masses, organomegaly or hernias noted. overweight Msk:     no joint tenderness and no joint swelling.   Pulses:     R radial normal and L radial normal.   Extremities:     No clubbing, cyanosis, edema, or deformity noted  Neurologic:     cranial nerves II-XII intact and gait normal.   Psych:     normally interactive and good eye contact.  Impression & Recommendations:  Problem # 1:  SMALL BOWEL OBSTRUCTION (ICD-560.9) no recurrence  Problem # 2:  HYPERLIPIDEMIA (ICD-272.4) has not been checked in quite some time---may rechieck at some point Labs Reviewed: SGOT: 25 (02/09/2008)   SGPT: 16 (02/09/2008)  Lipid Goals: Chol Goal: 200 (12/08/2006)   HDL Goal: 40 (12/08/2006)   LDL Goal: 130 (12/08/2006)   TG Goal: 150 (12/08/2006)  Prior 10 Yr Risk Heart Disease: Not enough information  (12/08/2006)   Problem # 3:  OSTEOPENIA (ICD-733.90) on HRT Her updated medication list for this problem includes:    Actonel 35 Mg Tabs (Risedronate sodium) ..... One weekly    Caltrate 600+d 600-400 Mg-unit Tabs (Calcium carbonate-vitamin d) .Marland Kitchen..Marland Kitchen Two times a day    Vitamin D 400 Unit Tabs (Cholecalciferol) ..... Once daily   Problem # 4:  ANEMIA-NOS (ICD-285.9) reviewed labs----hgb 11.1 g/dl  Complete Medication List: 1)  Actonel 35 Mg Tabs (Risedronate sodium) .... One weekly 2)  Aspir-81 81 Mg Tbec (Aspirin) .... Take 1 tablet by mouth once a day 3)  Claritin 10 Mg Tabs (Loratadine) .... Take 1 tablet by mouth once a day 4)  Prevacid 30 Mg Cpdr (Lansoprazole) .... One daily 5)  Caltrate 600+d 600-400 Mg-unit Tabs (Calcium carbonate-vitamin d) .... Two times a day 6)  Premarin 0.625 Mg/gm Crea (Estrogens, conjugated) .... 3 x week 7)  Fish Oil 1200 Mg Caps (Omega-3 fatty acids) .... 2 once daily 8)  Multivitamins Tabs (Multiple vitamin) .... Once daily 9)  Vitamin C 500 Mg Tabs (Ascorbic acid) .... Once daily 10)  Vitamin D 400 Unit Tabs (Cholecalciferol) .... Once daily 11)  Stool Softener 100 Mg Caps (Docusate sodium) .... Once daily 12)  Hydrocodone-acetaminophen 5-500 Mg Tabs (Hydrocodone-acetaminophen) .... Take 1/2-1 tablet two times a day 13)  Methenamine Hippurate 1 Gm Tabs (Methenamine hippurate) .... Take 1 tablet by mouth once a day 14)  Metamucil 30.9 % Powd (Psyllium) .... One dose once daily   Patient Instructions: 1)  Please schedule a follow-up appointment in 3 months.   ]

## 2011-01-30 NOTE — Letter (Signed)
Summary: [facility]   Imported By: Kassie Mends 12/14/2007 10:14:31  _____________________________________________________________________  External Attachment:    Type:   Image     Comment:   [facility]

## 2011-01-30 NOTE — Progress Notes (Signed)
Summary: triage  Phone Note Call from Patient Call back at Home Phone [phone]   Caller: husband, Alden Server Call For: Dr. Russella Dar Reason for Call: Talk to Nurse Summary of Call: would like to sch a f/u ov to disuss CTscan results... offered next available in August and husband insisted that pt have f/u within next week Initial call taken by: Vallarie Mare,  June 18, 2010 8:48 AM  Follow-up for Phone Call        I reviewed the results with the patient's husband.  I have reviewed again that patient will need to follow up with Dr Marliss Coots office per Dr Russella Dar for abnormal cardiac findings. Follow-up by: Darcey Nora RN, CGRN,  June 18, 2010 9:25 AM

## 2011-01-30 NOTE — Progress Notes (Signed)
Summary: REFILL REQUEST (Hydrocodone)  Phone Note Refill Request Message from:  Patient on May 17, 2010 8:41 AM  Refills Requested: Medication #1:  HYDROCODONE-ACETAMINOPHEN 7.5-500 MG/15ML SOLN 1 tsp by mouth three times a day as needed for pain   Notes: [facility].    Initial call taken by: Debbra Riding,  May 17, 2010 8:42 AM  Follow-up for Phone Call        ok Follow-up by: Birdie Sons MD,  May 17, 2010 10:09 AM    Prescriptions: HYDROCODONE-ACETAMINOPHEN 7.5-500 MG/15ML SOLN (HYDROCODONE-ACETAMINOPHEN) 1 tsp by mouth three times a day as needed for pain  #240 cc x 1   Entered by:   Kern Reap CMA (AAMA)   Authorized by:   Birdie Sons MD   Signed by:   Kern Reap CMA (AAMA) on 05/17/2010   Method used:   Telephoned to ...       [facility]  [phone]* (retail)       [address] [place], [place]  [address]       Ph: [phone] or [phone]       Fax: [phone]   RxID:   [id number]

## 2011-01-30 NOTE — Consult Note (Signed)
Summary: [facility]  [facility]   Imported By: Maryln Gottron 08/15/2008 14:11:38  _____________________________________________________________________  External Attachment:    Type:   Image     Comment:   External Document

## 2011-01-30 NOTE — Assessment & Plan Note (Signed)
Summary: REC COL/OVER 82.Marland KitchenMarland KitchenAS.   History of Present Illness Visit Type: follow up Primary GI MD: [name] Goody MD [facility] Primary Provider: Birdie Sons, MD Chief Complaint: Patient here to discuss having a colonoscopy. She takes Miralax for her constipation and she is doing great on it.  History of Present Illness:   This is an 77 year old female here today with her husband. She has a history of recurrent colon cancer and is status post colon resections in 1994 for a T2 colon cancer and 2008 for a T3, N1 colon cancer. She underwent colonoscopy in April 2009 with a tortuous colon. It was a moderately difficult procedure. Adhesions were suspected. I recommended a CT colonoscopy for any future surveillance exams.  She has ongoing problems with constipation and states she has a significant lower back pain that is relieved by bowel movements. Reflux symptoms are well controlled on Prevacid.   GI Review of Systems      Denies abdominal pain, acid reflux, belching, bloating, chest pain, dysphagia with liquids, dysphagia with solids, heartburn, loss of appetite, nausea, vomiting, vomiting blood, weight loss, and  weight gain.      Reports constipation.     Denies anal fissure, black tarry stools, change in bowel habit, diarrhea, diverticulosis, fecal incontinence, heme positive stool, hemorrhoids, irritable bowel syndrome, jaundice, light color stool, liver problems, rectal bleeding, and  rectal pain. Preventive Screening-Counseling & Management      Drug Use:  no.     Current Medications (verified): 1)  Actonel 35 Mg Tabs (Risedronate Sodium) .... One Weekly 2)  Aspir-81 81 Mg Tbec (Aspirin) .... Take 1 Tablet By Mouth Once A Day 3)  Claritin 10 Mg Tabs (Loratadine) .... Take 1 Tablet By Mouth Once A Day 4)  Prevacid 15 Mg Cpdr (Lansoprazole) .... Once Daily 5)  Fish Oil 1200 Mg  Caps (Omega-3 Fatty Acids) .... 2 Once Daily 6)  Vitamin D 1000 Unit Tabs (Cholecalciferol) .... Two Times A  Day 7)  Stool Softener 100 Mg  Caps (Docusate Sodium) .... Two Two Times A Day 8)  Metamucil 30.9 %  Powd (Psyllium) .... One Dose Once Daily 9)  Senokot S 8.6-50 Mg Tabs (Sennosides-Docusate Sodium) .... Two Tablets Once Daily 10)  Muro 128 2 % Soln (Sodium Chloride (Hypertonic)) .... Qid 11)  Bepreve 1.5 % Soln (Bepotastine Besilate) .... Two Times A Day 12)  Calcium Citrate 600mg  .... Two Times A Day 13)  Ferrous Sulfate 325 (65 Fe) Mg Tabs (Ferrous Sulfate) .... Once Daily 14)  Hydrocodone-Acetaminophen 7.5-500 Mg/10ml Soln (Hydrocodone-Acetaminophen) .Marland Kitchen.. 1 Tsp By Mouth Three Times A Day As Needed For Pain 15)  Proair Hfa 108 (90 Base) Mcg/act Aers (Albuterol Sulfate) .... Use Once A Day As Needed 16)  Premarin 0.625 Mg/gm Crea (Estrogens, Conjugated) .... Use Vaginally Daily 17)  Multivitamins  Tabs (Multiple Vitamin) .... Take One By Mouth Once Daily 18)  Miralax  Powd (Polyethylene Glycol 3350) .... Take Half A Capful Daily Mixed in 8 Ounce of Water  Allergies (verified): 1)  ! Methenamine Mandelate (Methenamine Mandelate) 2)  * Lidoderm Patch 3)  Morphine Sulfate (Morphine Sulfate) 4)  Penicillin G Potassium (Penicillin G Potassium) 5)  Phenergan (Promethazine Hcl) 6)  * Statin Drugs  Past History:  Past Medical History: Anemia T2 colon cancer 1994 T3, N1 colon cancer 2008 GERD Hyperlipidemia Osteopenia chronic back pain Small Bowel Obstruction Diverticulosis Hemorrhoids Arthritis Allergic rhinits  Past Surgical History: Appendectomy 1950's Right hemicolectomy, colon cancer, 1994 shoulder surgery 03/2000 enterocoele  knee arthroscopy [facility]--lysis of adhesion Back Surgery-2007 Segmental colectomy, recurrent colon cancer, 2008 multiple ESI--back SBO-2009-did not require surgical intervention Hysterectomy Rotator Cuff Repair pelvic prolapse with anterior vaginal vault repair bilateral cateract surgery eye lid lift  Family History: family history of lung  cancer family history of alcoholism patients family does not live in this county and she does not know alot of her family history  Social History: Married Never Smoked Regular exercise-no Alcohol Use - no Daily Caffeine Use one per day Illicit Drug Use - no Drug Use:  no  Review of Systems       The patient complains of arthritis/joint pain and back pain.         The pertinent positives and negatives are noted as above and in the HPI. All other ROS were reviewed and were negative.   Vital Signs:  Patient profile:   77 year old female Height:      59 inches Weight:      136.2 pounds BMI:     27.61 Pulse rate:   66 / minute Pulse rhythm:   regular BP sitting:   118 / 62  (left arm) Cuff size:   regular  Vitals Entered By: [name] Barker CMA [name] Barker) (May 30, 2010 9:44 AM)  Physical Exam  General:  Well developed, well nourished, no acute distress. Head:  Normocephalic and atraumatic. Eyes:  PERRLA, no icterus. Ears:  Normal auditory acuity. Mouth:  No deformity or lesions, dentition normal. Neck:  Supple; no masses or thyromegaly. Chest Wall:  Symmetrical;  no deformities or tenderness. Lungs:  Clear throughout to auscultation. Heart:  Regular rate and rhythm; no murmurs, rubs,  or bruits. Abdomen:  Soft, nontender and nondistended. No masses, hepatosplenomegaly or hernias noted. Normal bowel sounds. Msk:  Symmetrical with no gross deformities. Normal posture. Pulses:  Normal pulses noted. Extremities:  No clubbing, cyanosis, edema or deformities noted. Neurologic:  Alert and  oriented x4;  grossly normal neurologically. Cervical Nodes:  No significant cervical adenopathy. Inguinal Nodes:  No significant inguinal adenopathy. Psych:  Alert and cooperative. Normal mood and affect.  Impression & Recommendations:  Problem # 1:  PERSONAL HX COLON CANCER (ICD-V10.05) Personal history of recurrent colon cancer, 1994 and 2008. Colonoscopy in April 2009 was free of polyps.  That examination was difficult with a tortuous colon, suspected adhesions. Options of CT colonoscopy, barium enema or no further surveillance procedures were discussed with the patient. Given that she is in overall good health for her age, she decided to proceed with CT colonoscopy.   Problem # 2:  CONSTIPATION (ICD-564.00) Continue a high-fiber diet with fiber supplements current regimen with Senokot and MiraLax.  Problem # 3:  DIVERTICULOSIS-COLON (ICD-562.10) See problem #2.  Problem # 4:  GERD (ICD-530.81) Well-controlled on Prevacid and standard antireflux measures.  Patient Instructions: 1)  [facility] will contact you with information about the CT colonoscopy.  2)  Copy sent to : [name] Sons, MD 3)                         Lavell Islam, MD 4)  The medication list was reviewed and reconciled.  All changed / newly prescribed medications were explained.  A complete medication list was provided to the patient / caregiver.  Appended Document: Orders Update    Clinical Lists Changes  Orders: Added new Test order of Virtual Colon ([facility]) - Signed

## 2011-01-30 NOTE — Procedures (Signed)
Summary: [facility]   Imported By: Sherian Rein 05/30/2010 15:48:40  _____________________________________________________________________  External Attachment:    Type:   Image     Comment:   External Document

## 2011-01-30 NOTE — Assessment & Plan Note (Signed)
Summary: 3 WEEKS FUP//CCM   Vital Signs:  Patient profile:   77 year old female Weight:      139 pounds Temp:     98 degrees F oral Pulse rate:   68 / minute Pulse rhythm:   regular Resp:     12 per minute BP sitting:   112 / 70  (left arm)  Vitals Entered By: Gladis Riffle, RN (March 26, 2010 1:12 PM) CC: 3 week rov--states burning sensation resolved when stopped methenamine--now feeling better Is Patient Diabetic? No   CC:  3 week rov--states burning sensation resolved when stopped methenamine--now feeling better.  History of Present Illness: much better off tramadol and methenamine hipurate mood much better pain is controlled reasonably on current meds  All other systems reviewed and were negative   Preventive Screening-Counseling & Management  Alcohol-Tobacco     Smoking Status: never  Current Problems (verified): 1)  Low Back Pain, Chronic  (ICD-724.2) 2)  Adenocarcinoma, Colon  (ICD-153.9) 3)  Osteopenia  (ICD-733.90) 4)  Hyperlipidemia  (ICD-272.4) 5)  Gerd  (ICD-530.81) 6)  Anemia-nos  (ICD-285.9)  Current Medications (verified): 1)  Actonel 35 Mg Tabs (Risedronate Sodium) .... One Weekly 2)  Aspir-81 81 Mg Tbec (Aspirin) .... Take 1 Tablet By Mouth Once A Day 3)  Claritin 10 Mg Tabs (Loratadine) .... Take 1 Tablet By Mouth Once A Day 4)  Prevacid 15 Mg Cpdr (Lansoprazole) .... Once Daily 5)  Fish Oil 1200 Mg  Caps (Omega-3 Fatty Acids) .... 2 Once Daily 6)  Vitamin D 1000 Unit Tabs (Cholecalciferol) .... Two Times A Day 7)  Stool Softener 100 Mg  Caps (Docusate Sodium) .... Two Two Times A Day 8)  Metamucil 30.9 %  Powd (Psyllium) .... One Dose Once Daily 9)  Senokot S 8.6-50 Mg Tabs (Sennosides-Docusate Sodium) .... Four Tablets Once Daily 10)  Muro 128 2 % Soln (Sodium Chloride (Hypertonic)) .... Qid 11)  Bepreve 1.5 % Soln (Bepotastine Besilate) .... Two Times A Day 12)  Calcium Citrate 600mg  .... Two Times A Day 13)  Ferrous Sulfate 325 (65 Fe) Mg Tabs  (Ferrous Sulfate) .... Once Daily 14)  Hydrocodone-Acetaminophen 7.5-500 Mg/77ml Soln (Hydrocodone-Acetaminophen) .Marland Kitchen.. 1 Tsp By Mouth Three Times A Day As Needed For Pain  Allergies: 1)  ! Methenamine Mandelate (Methenamine Mandelate) 2)  * Lidoderm Patch 3)  Morphine Sulfate (Morphine Sulfate) 4)  Penicillin G Potassium (Penicillin G Potassium) 5)  Phenergan (Promethazine Hcl) 6)  * Statin Drugs  Past History:  Past Medical History: Last updated: 11/18/2007 Anemia-NOS Colon cancer, hx of GERD Hyperlipidemia Osteopenia chronic back pain  Past Surgical History: Last updated: 02/21/2008 Appendectomy 1950's Colectomy, partial 1994 shoulder surgery 04/02/00 enterocoele knee arthroscopy S[facility]--lysis of adhesion Back Surgery-2007 recurrent colon CA-2008 multiple ESI--back SBO-2009-did not require surgical intervention  Social History: Last updated: 02/09/2008 Married Never Smoked Regular exercise-no  Risk Factors: Exercise: no (02/09/2008)  Risk Factors: Smoking Status: never (03/26/2010)  Physical Exam  General:  alert and well-developed.   Head:  normocephalic and atraumatic.   Neck:  No deformities, masses, or tenderness noted. Chest Wall:  No deformities, masses, or tenderness noted. Skin:  turgor normal and color normal.   Psych:  normally interactive and good eye contact.     Impression & Recommendations:  Problem # 1:  LOW BACK PAIN, CHRONIC (ICD-724.2) ok to continue meds reasonable control  Her updated medication list for this problem includes:    Aspir-81 81 Mg Tbec (Aspirin) .Marland Kitchen... Take 1 tablet  by mouth once a day    Hydrocodone-acetaminophen 7.5-500 Mg/16ml Soln (Hydrocodone-acetaminophen) .Marland Kitchen... 1 tsp by mouth three times a day as needed for pain  Problem # 2:  ADENOCARCINOMA, COLON (ICD-153.9) no recurrence  Problem # 3:  HYPERLIPIDEMIA (ICD-272.4)  Problem # 4:  GERD (ICD-530.81) controlled continue current medications  Her updated  medication list for this problem includes:    Prevacid 15 Mg Cpdr (Lansoprazole) ..... Once daily  Complete Medication List: 1)  Actonel 35 Mg Tabs (Risedronate sodium) .... One weekly 2)  Aspir-81 81 Mg Tbec (Aspirin) .... Take 1 tablet by mouth once a day 3)  Claritin 10 Mg Tabs (Loratadine) .... Take 1 tablet by mouth once a day 4)  Prevacid 15 Mg Cpdr (Lansoprazole) .... Once daily 5)  Fish Oil 1200 Mg Caps (Omega-3 fatty acids) .... 2 once daily 6)  Vitamin D 1000 Unit Tabs (Cholecalciferol) .... Two times a day 7)  Stool Softener 100 Mg Caps (Docusate sodium) .... Two two times a day 8)  Metamucil 30.9 % Powd (Psyllium) .... One dose once daily 9)  Senokot S 8.6-50 Mg Tabs (Sennosides-docusate sodium) .... Four tablets once daily 10)  Muro 128 2 % Soln (Sodium chloride (hypertonic)) .... Qid 11)  Bepreve 1.5 % Soln (Bepotastine besilate) .... Two times a day 12)  Calcium Citrate 600mg   .... Two times a day 13)  Ferrous Sulfate 325 (65 Fe) Mg Tabs (Ferrous sulfate) .... Once daily 14)  Hydrocodone-acetaminophen 7.5-500 Mg/25ml Soln (Hydrocodone-acetaminophen) .Marland Kitchen.. 1 tsp by mouth three times a day as needed for pain  Patient Instructions: 1)  Please schedule a follow-up appointment in 1 month.

## 2011-01-30 NOTE — Discharge Summary (Signed)
Summary: Small bowel obstruction  NAME:  [name] Barker, [name] Barker           ACCOUNT NO.:  [id number]      MEDICAL RECORD NO.:  [id number]          PATIENT TYPE:  INP      LOCATION:  5524                         FACILITY:  [facility]      PHYSICIAN:  Valerie A. Felicity Coyer, MDDATE OF BIRTH:  09-05-1921      DATE OF ADMISSION:  02/09/2008   DATE OF DISCHARGE:  02/14/2008                                  DISCHARGE SUMMARY      DISCHARGE DIAGNOSES:   1. Partial small-bowel obstruction likely secondary to adhesions.   2. Hypokalemia, resolved status post repletion.   3. History of colon cancer.   4. Gastroesophageal reflux disease.   5. Chronic back pain.   6. Hyperlipidemia.   7. History of asthma.      HISTORY OF PRESENT ILLNESS:  [name] is an 77 year old female who   was admitted on February 09, 2008, secondary to abdominal pain which is   onset, present for several weeks, it was worsened the 2 days prior to   admission.  She was admitted for further evaluation and treatment.      HOSPITAL COURSE:  Small-bowel obstruction.  The patient was admitted.   Acute abdominal series performed on admission was consistent with   further partial small-bowel obstruction.  Followup  KUB on February 12,   showed partial small-bowel obstruction.  The patient was managed   conservatively, and her diet was advanced.  She tolerated a regular diet   and was discharged to home.      DISCHARGE MEDICATIONS:   1. Actonel 35 mg p.o. once weekly.   2. Aspirin 81 mg p.o. daily.   3. Claritin 10 mg p.o. daily.   4. Prevacid 30 mg p.o. daily.   5. Vicodin 5/500 mg one-half tablet to one tablet p.o. daily as       needed.   6. Caltrate 600 plus D one tablet p.o. b.i.d.   7. Premarin 0.65 mg cream three times weekly.   8. Fish oil 1200 mg one tablet p.o. b.i.d.   9. Vitamin B6 50 mg p.o. daily.   10.Multivitamin one tablet p.o. daily.      PERTINENT LABS AT TIME OF DISCHARGE:  BUN 23, creatinine 0.68,   hemoglobin 10.3, and hematocrit 30.5.      FOLLOWUP:  The patient is instructed to follow up with Dr. Birdie Sons   in 1 week and contact the office for an appointment.               Sandford Craze, NP               Raenette Rover. Felicity Coyer, MD   Electronically Signed         MO/MEDQ  D:  03/31/2008  T:  04/01/2008  Job:  [id number]

## 2011-03-17 ENCOUNTER — Encounter: Payer: Self-pay | Admitting: Internal Medicine

## 2011-03-18 ENCOUNTER — Encounter: Payer: Self-pay | Admitting: Internal Medicine

## 2011-03-18 ENCOUNTER — Ambulatory Visit (INDEPENDENT_AMBULATORY_CARE_PROVIDER_SITE_OTHER): Payer: Medicare Other | Admitting: Internal Medicine

## 2011-03-18 DIAGNOSIS — K219 Gastro-esophageal reflux disease without esophagitis: Secondary | ICD-10-CM

## 2011-03-18 DIAGNOSIS — R5381 Other malaise: Secondary | ICD-10-CM

## 2011-03-18 DIAGNOSIS — D649 Anemia, unspecified: Secondary | ICD-10-CM

## 2011-03-18 DIAGNOSIS — I2510 Atherosclerotic heart disease of native coronary artery without angina pectoris: Secondary | ICD-10-CM

## 2011-03-18 DIAGNOSIS — M545 Low back pain, unspecified: Secondary | ICD-10-CM

## 2011-03-18 DIAGNOSIS — R531 Weakness: Secondary | ICD-10-CM

## 2011-03-18 LAB — CBC WITH DIFFERENTIAL/PLATELET
Basophils Absolute: 0 10*3/uL (ref 0.0–0.1)
Basophils Relative: 0.4 % (ref 0.0–3.0)
Eosinophils Absolute: 0.1 10*3/uL (ref 0.0–0.7)
Eosinophils Relative: 1.9 % (ref 0.0–5.0)
HCT: 35.9 % — ABNORMAL LOW (ref 36.0–46.0)
Hemoglobin: 11.9 g/dL — ABNORMAL LOW (ref 12.0–15.0)
Lymphocytes Relative: 35.6 % (ref 12.0–46.0)
Lymphs Abs: 2.1 10*3/uL (ref 0.7–4.0)
MCHC: 33.3 g/dL (ref 30.0–36.0)
MCV: 93.1 fl (ref 78.0–100.0)
Monocytes Absolute: 0.6 10*3/uL (ref 0.1–1.0)
Monocytes Relative: 10 % (ref 3.0–12.0)
Neutro Abs: 3.1 10*3/uL (ref 1.4–7.7)
Neutrophils Relative %: 52.1 % (ref 43.0–77.0)
Platelets: 197 10*3/uL (ref 150.0–400.0)
RBC: 3.86 Mil/uL — ABNORMAL LOW (ref 3.87–5.11)
RDW: 13.2 % (ref 11.5–14.6)
WBC: 5.9 10*3/uL (ref 4.5–10.5)

## 2011-03-18 LAB — BASIC METABOLIC PANEL
BUN: 12 mg/dL (ref 6–23)
CO2: 32 mEq/L (ref 19–32)
Calcium: 9.3 mg/dL (ref 8.4–10.5)
Chloride: 102 mEq/L (ref 96–112)
Creatinine, Ser: 0.6 mg/dL (ref 0.4–1.2)
GFR: 93.11 mL/min (ref >60.00)
Glucose, Bld: 91 mg/dL (ref 70–99)
Potassium: 5.5 mEq/L — ABNORMAL HIGH (ref 3.5–5.1)
Sodium: 141 mEq/L (ref 135–145)

## 2011-03-18 LAB — HEPATIC FUNCTION PANEL
ALT: 15 U/L (ref 0–35)
AST: 28 U/L (ref 0–37)
Albumin: 3.6 g/dL (ref 3.5–5.2)
Alkaline Phosphatase: 51 U/L (ref 39–117)
Bilirubin, Direct: 0.1 mg/dL (ref 0.0–0.3)
Total Bilirubin: 0.6 mg/dL (ref 0.3–1.2)
Total Protein: 6.4 g/dL (ref 6.0–8.3)

## 2011-03-18 LAB — SEDIMENTATION RATE: Sed Rate: 19 mm/hr (ref 0–22)

## 2011-03-18 LAB — TSH: TSH: 4.08 u[IU]/mL (ref 0.35–5.50)

## 2011-03-18 NOTE — Patient Instructions (Signed)
Call or return to clinic prn if these symptoms worsen or fail to improve as anticipated.

## 2011-03-18 NOTE — Progress Notes (Signed)
  Subjective:    Patient ID: [name] Barker, female    DOB: 1921/02/14, 77 y.o.   MRN: [id number]  HPI   77 year old patient who presents with a one-month history of increasing weakness and fatigue. She  denies any focal complaints. She does have a history of colon adenocarcinoma with also a history of recurrence. She is followed by GI. As also been seen by gynecology recently due to some postmenopausal bleeding. She does have a history of anemia. She also has chronic low back pain that has not worsened. She denies any anorexia change in weight fever or chills. Wt Readings from Last 3 Encounters:  03/18/11 135 lb (61.236 kg)  11/19/10 134 lb (60.782 kg)  10/05/10 133 lb (60.328 kg)      Review of Systems  Constitutional: Positive for fatigue. Negative for fever, appetite change and unexpected weight change.  HENT: Negative for hearing loss, ear pain, nosebleeds, congestion, sore throat, mouth sores, trouble swallowing, neck stiffness, dental problem, voice change, sinus pressure and tinnitus.   Eyes: Negative for photophobia, pain, redness and visual disturbance.  Respiratory: Negative for cough, chest tightness and shortness of breath.   Cardiovascular: Negative for chest pain, palpitations and leg swelling.  Gastrointestinal: Negative for nausea, vomiting, abdominal pain, diarrhea, constipation, blood in stool, abdominal distention and rectal pain.  Genitourinary: Positive for vaginal bleeding. Negative for dysuria, urgency, frequency, hematuria, flank pain, vaginal discharge, difficulty urinating, genital sores, vaginal pain, menstrual problem and pelvic pain.       [ Evaluated by GYN Musculoskeletal: Positive for back pain. Negative for arthralgias.       [ Chronic and unchanged Skin: Negative for rash.  Neurological: Positive for weakness. Negative for dizziness, syncope, speech difficulty, light-headedness, numbness and headaches.  Hematological: Negative for adenopathy. Does not  bruise/bleed easily.  Psychiatric/Behavioral: Negative for suicidal ideas, behavioral problems, self-injury, dysphoric mood and agitation. The patient is not nervous/anxious.        Objective:   Physical Exam  Constitutional: She is oriented to person, place, and time. She appears well-developed and well-nourished. No distress.  HENT:  Head: Normocephalic and atraumatic.  Right Ear: External ear normal.  Left Ear: External ear normal.  Mouth/Throat: Oropharynx is clear and moist.  Eyes: Conjunctivae and EOM are normal.  Neck: Normal range of motion. Neck supple. No JVD present. No thyromegaly present.  Cardiovascular: Normal rate, regular rhythm, normal heart sounds and intact distal pulses.   No murmur heard. Pulmonary/Chest: Effort normal and breath sounds normal. She has no wheezes. She has no rales.        O2 saturation 97 pulse rate of 64  Abdominal: Soft. Bowel sounds are normal. She exhibits no distension and no mass. There is no tenderness. There is no rebound and no guarding.        Surgical scar  Musculoskeletal: Normal range of motion. She exhibits no edema and no tenderness.  Neurological: She is alert and oriented to person, place, and time. She has normal reflexes. No cranial nerve deficit. She exhibits normal muscle tone. Coordination normal.  Skin: Skin is warm and dry. No rash noted.  Psychiatric: She has a normal mood and affect. Her behavior is normal.          Assessment & Plan:   weakness unclear etiology. We'll obtain a laboratory update  Coronary artery disease stable  Chronic low back pain unchanged  History of colon adenocarcinoma  History of anemia

## 2011-04-02 ENCOUNTER — Ambulatory Visit: Payer: Medicare Other | Admitting: Internal Medicine

## 2011-04-02 ENCOUNTER — Ambulatory Visit (INDEPENDENT_AMBULATORY_CARE_PROVIDER_SITE_OTHER): Payer: Medicare Other | Admitting: Internal Medicine

## 2011-04-02 ENCOUNTER — Encounter: Payer: Self-pay | Admitting: Internal Medicine

## 2011-04-02 DIAGNOSIS — I2510 Atherosclerotic heart disease of native coronary artery without angina pectoris: Secondary | ICD-10-CM

## 2011-04-02 DIAGNOSIS — D649 Anemia, unspecified: Secondary | ICD-10-CM

## 2011-04-02 DIAGNOSIS — C189 Malignant neoplasm of colon, unspecified: Secondary | ICD-10-CM

## 2011-04-02 NOTE — Assessment & Plan Note (Signed)
No known recurrence Followed by oncology

## 2011-04-02 NOTE — Assessment & Plan Note (Signed)
Non obstructive dzs and she has no sxs

## 2011-04-02 NOTE — Progress Notes (Signed)
  Subjective:    Patient ID: [name] Barker, female    DOB: Nov 13, 1921, 77 y.o.   MRN: [id number]  HPI Patient presents with her husband for followup  patient comes in for followup of multiple medical problems.  #1 she has adenocarcinoma of the colon. No known recurrence at this time.  Patient with hyperlipidemia. She is currently not treated with any medications.  Patient with anemia. She takes iron regularly. She will need followup.  Patient with chronic low back pain. She is doing quite well. She does take low dose narcotic pain medications chronically. No known side effect of the medications.  In addition she is here for f/u labs---reviewed Dr. Charm Rings note---she is feeling better.   Past Medical History  Diagnosis Date  . ADENOCARCINOMA, COLON 08/19/2007  . ANEMIA-NOS 11/17/2006  . CAD 06/21/2010  . DIVERTICULOSIS-COLON 05/30/2010  . GERD 11/17/2006  . HYPERLIPIDEMIA 11/17/2006  . LOW BACK PAIN, CHRONIC 02/04/2010  . OSTEOPENIA 11/17/2006   No past surgical history on file.  reports that she has never smoked. She has never used smokeless tobacco. She reports that she does not drink alcohol or use illicit drugs. family history is not on file. Allergies  Allergen Reactions  . Methenamine Mandelate     REACTION: burning sensation  . Morphine Sulfate     REACTION: blister  . Nsaids     REACTION: oral nsaids do not work for her back pain  . Penicillins     REACTION: urticaria (hives)  . Promethazine Hcl     REACTION: hallucinations  . Statins     REACTION: unspecified     Review of Systems    patient denies chest pain, shortness of breath, orthopnea. Denies lower extremity edema, abdominal pain, change in appetite, change in bowel movements. Patient denies rashes, musculoskeletal complaints. No other specific complaints in a complete review of systems.    Objective:   Physical Exam Elderly female in no acute distress. She is walking with a cane. HEENT exam atraumatic,  normocephalic, neck supple. No jugular venous distention. Chest clear to auscultation without increased work of breathing. Cardiac exam S1 and S2 are regular without gallops. Abdominal exam bowel sounds, soft, nontender. Extremities there is no edema. Neurologic exam she is alert. She is able to carry on a normal conversation with me and her husband.       Assessment & Plan:

## 2011-04-02 NOTE — Assessment & Plan Note (Signed)
Minimal--no need for f/u

## 2011-04-16 ENCOUNTER — Encounter (HOSPITAL_BASED_OUTPATIENT_CLINIC_OR_DEPARTMENT_OTHER): Payer: Medicare Other | Admitting: Oncology

## 2011-04-16 ENCOUNTER — Other Ambulatory Visit: Payer: Self-pay | Admitting: Oncology

## 2011-04-16 DIAGNOSIS — D649 Anemia, unspecified: Secondary | ICD-10-CM

## 2011-04-16 DIAGNOSIS — M545 Low back pain, unspecified: Secondary | ICD-10-CM

## 2011-04-16 DIAGNOSIS — C189 Malignant neoplasm of colon, unspecified: Secondary | ICD-10-CM

## 2011-04-16 DIAGNOSIS — D1739 Benign lipomatous neoplasm of skin and subcutaneous tissue of other sites: Secondary | ICD-10-CM

## 2011-04-16 DIAGNOSIS — G8929 Other chronic pain: Secondary | ICD-10-CM

## 2011-04-16 DIAGNOSIS — D509 Iron deficiency anemia, unspecified: Secondary | ICD-10-CM

## 2011-04-16 LAB — CBC WITH DIFFERENTIAL/PLATELET
BASO%: 0.5 % (ref 0.0–2.0)
Basophils Absolute: 0 10*3/uL (ref 0.0–0.1)
EOS%: 2 % (ref 0.0–7.0)
Eosinophils Absolute: 0.1 10*3/uL (ref 0.0–0.5)
HCT: 34.2 % — ABNORMAL LOW (ref 34.8–46.6)
HGB: 11.4 g/dL — ABNORMAL LOW (ref 11.6–15.9)
LYMPH%: 33.9 % (ref 14.0–49.7)
MCH: 30.8 pg (ref 25.1–34.0)
MCHC: 33.2 g/dL (ref 31.5–36.0)
MCV: 92.6 fL (ref 79.5–101.0)
MONO#: 0.6 10*3/uL (ref 0.1–0.9)
MONO%: 11.8 % (ref 0.0–14.0)
NEUT#: 2.8 10*3/uL (ref 1.5–6.5)
NEUT%: 51.8 % (ref 38.4–76.8)
Platelets: 200 10*3/uL (ref 145–400)
RBC: 3.7 10*6/uL (ref 3.70–5.45)
RDW: 12.8 % (ref 11.2–14.5)
WBC: 5.4 10*3/uL (ref 3.9–10.3)
lymph#: 1.8 10*3/uL (ref 0.9–3.3)

## 2011-04-16 LAB — CEA: CEA: 1.9 ng/mL (ref 0.0–5.0)

## 2011-05-05 ENCOUNTER — Other Ambulatory Visit: Payer: Self-pay | Admitting: Obstetrics and Gynecology

## 2011-05-13 NOTE — Assessment & Plan Note (Signed)
[facility]                         GASTROENTEROLOGY OFFICE NOTE   [name] Barker, [name] Barker                  MRN:          [id number]  DATE:04/06/2008                            DOB:          01/18/1921    [name] Barker returns for followup of colon cancer and recent small  bowel obstruction.  Her discharge summary and abdominal film reports  were reviewed.  She was hospitalized from February 09, 2008 to February 14, 2008 for partial small bowel obstruction felt secondary to  adhesions.  She has ongoing mild constipation, but no other abdominal  complaints and she specifically denies any change in bowel habits,  change in stool caliber, weight loss, melena, or hematochezia.  She has  a history of T3, N1 colon cancer status post segmental colectomy on May 11, 2007, and a history of T2 colon cancer status post right  hemicolectomy in 1994.  Her last colonoscopy was in April 2008 where her  most recent colon cancer was discovered.  She has a tortuous colon with  a prior incomplete colonoscopy however her last colonoscopy was  completed with a pediatric colonoscope.   CURRENT MEDICATIONS:  Listed on the chart, updated and reviewed.   ALLERGIES:  MORPHINE, PENICILLIN, AND STATIN DRUGS.   PHYSICAL EXAMINATION:  In no acute distress.  Weight 132.6 pounds.  Blood pressure is 108/68.  Pulse is 66 and regular.  HEENT:  Anicteric sclerae.  Oropharynx clear.  CHEST:  Clear to auscultation bilaterally.  CARDIAC:  Regular rate and rhythm without murmurs appreciated.  ABDOMEN:  Soft, nontender, nondistended.  Normoactive bowel sounds.  No  palpable organomegaly, masses, or hernias.  RECTAL:  Deferred to colonoscopy.  NEUROLOGIC:  Alert and oriented x3.  Grossly nonfocal.   ASSESSMENT AND PLAN:  1. Personal history of T2 colon cancer status post right hemicolectomy      in 1994 and recurrent T3, N1 colon cancer status post segmental      colectomy in May  2008.  She has chronic constipation and      diverticulosis.  Risks, benefits, and alternatives to colonoscopy      with possible biopsy and possible polypectomy discussed with the      patient and she consents to proceed and this will be scheduled      electively with a pediatric colonoscope.  2. History of recurrent partial small bowel obstructions felt      secondary to adhesions.  Expectant management.     Venita Lick. Russella Dar, MD, [facility]  Electronically Signed   MTS/MedQ  DD: 04/06/2008  DT: 04/06/2008  Job #: [id number]   cc:   Valetta Mole. Swords, MD

## 2011-05-13 NOTE — Discharge Summary (Signed)
[name], [name]           ACCOUNT NO.:  [id number]   MEDICAL RECORD NO.:  [id number]          PATIENT TYPE:  INP   LOCATION:  5524                         FACILITY:  [facility]   PHYSICIAN:  Valerie A. Felicity Coyer, MDDATE OF BIRTH:  December 25, 1921   DATE OF ADMISSION:  02/09/2008  DATE OF DISCHARGE:  02/14/2008                               DISCHARGE SUMMARY   DISCHARGE DIAGNOSES:  1. Partial small-bowel obstruction likely secondary to adhesions.  2. Hypokalemia, resolved status post repletion.  3. History of colon cancer.  4. Gastroesophageal reflux disease.  5. Chronic back pain.  6. Hyperlipidemia.  7. History of asthma.   HISTORY OF PRESENT ILLNESS:  [name] is an 77 year old female who  was admitted on February 09, 2008, secondary to abdominal pain which is  onset, present for several weeks, it was worsened the 2 days prior to  admission.  She was admitted for further evaluation and treatment.   HOSPITAL COURSE:  Small-bowel obstruction.  The patient was admitted.  Acute abdominal series performed on admission was consistent with  further partial small-bowel obstruction.  Followup  KUB on February 12,  showed partial small-bowel obstruction.  The patient was managed  conservatively, and her diet was advanced.  She tolerated a regular diet  and was discharged to home.   DISCHARGE MEDICATIONS:  1. Actonel 35 mg p.o. once weekly.  2. Aspirin 81 mg p.o. daily.  3. Claritin 10 mg p.o. daily.  4. Prevacid 30 mg p.o. daily.  5. Vicodin 5/500 mg one-half tablet to one tablet p.o. daily as      needed.  6. Caltrate 600 plus D one tablet p.o. b.i.d.  7. Premarin 0.65 mg cream three times weekly.  8. Fish oil 1200 mg one tablet p.o. b.i.d.  9. Vitamin B6 50 mg p.o. daily.  10.Multivitamin one tablet p.o. daily.   PERTINENT LABS AT TIME OF DISCHARGE:  BUN 23, creatinine 0.68,  hemoglobin 10.3, and hematocrit 30.5.   FOLLOWUP:  The patient is instructed to follow up with Dr.  Birdie Sons  in 1 week and contact the office for an appointment.      Sandford Craze, NP      Raenette Rover. Felicity Coyer, MD  Electronically Signed    MO/MEDQ  D:  03/31/2008  T:  04/01/2008  Job:  [id number]

## 2011-05-13 NOTE — Discharge Summary (Signed)
[name] Barker, [name] Barker           ACCOUNT NO.:  [id number]   MEDICAL RECORD NO.:  [id number]          PATIENT TYPE:  INP   LOCATION:  1322                         FACILITY:  [facility]   PHYSICIAN:  Thornton Park. Daphine Deutscher, MD  DATE OF BIRTH:  1921-02-28   DATE OF ADMISSION:  05/11/2007  DATE OF DISCHARGE:  05/15/2007                               DISCHARGE SUMMARY   ADMISSION DIAGNOSIS:  Adenocarcinoma in the transverse colon.   DISCHARGE DIAGNOSIS:  Ileocolonic segment with invasive colonic  adenocarcinoma with negative surgical margins of resection and with 1  metastatic lymph node out of 21 (T3, N1, MX).   COURSE IN THE HOSPITAL:  [name] came in and underwent partial  colectomy with primary anastomosis.  She did well and started passing  gas and taking liquids and was ready for discharge on the above-  mentioned day.  She was seen in the hospital by Dr. Mancel Bale.  She  went home on postoperative day #4 to the care of her husband.  They  seemed very comfortable managing her postoperatively.   FINAL DIAGNOSIS:  Adenocarcinoma of the transverse colon.      Thornton Park Daphine Deutscher, MD  Electronically Signed     MBM/MEDQ  D:  06/09/2007  T:  06/10/2007  Job:  [id number]   cc:   Leighton Roach. Truett Perna, M.D.  Fax: [phone]

## 2011-05-13 NOTE — Consult Note (Signed)
[name] Barker, [name] Barker           ACCOUNT NO.:  [id number]   MEDICAL RECORD NO.:  [id number]          PATIENT TYPE:  INP   LOCATION:  1322                         FACILITY:  [facility]   PHYSICIAN:  Leighton Roach. Truett Perna, M.D. DATE OF BIRTH:  01-Jul-1921   DATE OF CONSULTATION:  05/13/2007  DATE OF DISCHARGE:  05/15/2007                                 CONSULTATION   .   REASON FOR CONSULTATION:  Colon cancer.   REFERRING PHYSICIAN:  Thornton Park. Daphine Deutscher, M.D.   HISTORY OF PRESENT ILLNESS:  [name] Barker is an 77 year old woman with  a history of colon cancer, status post surgery and chemotherapy in 1994.  On April 12, 2007 she underwent a colonoscopy procedure and was found to  have a 4 cm tumor in the ascending colon.  Biopsy showed a moderately  differentiated invasive colorectal type adenocarcinoma ([id number]).  CT  scan to the abdomen and pelvis on April 14, 2007 showed a 3 x 2 cm  lesion in the ascending colon just distal to the ileocecal valve; a few  small scattered mesenteric and retroperitoneal lymph nodes; there was no  evidence of hepatic metastatic disease.  Diverticulosis was noted  involving the sigmoid colon.   On May 11, 2007 she underwent a partial colectomy with primary  anastomosis by Dr. Daphine Deutscher.  Final pathology showed a 3 cm invasive  adenocarcinoma with mucinous features, moderately differentiated;  margins were negative; tumor invaded through the muscularis propria into  pericolonic adipose tissue; positive vascular/lymphatic invasion; 1/21  lymph nodes was positive.   PAST MEDICAL HISTORY:  1. Colon cancer 1994, status post surgery and chemotherapy.  2. Small bowel obstruction, February 2006, status post exploratory      laparotomy with lysis of adhesions.  3. GERD.  4. History of dyslipidemia.  5. History of osteoporosis.  6. Degenerative disk disease, status post L2-L3 decompressive      laminectomy/fusion.  7. Diverticulosis.  8. Chronic constipation.  9.  Status post left total knee replacement.  10.Status post hysterectomy.  11.Status post appendectomy.  12.Status post rotator cuff surgery.   MEDICATIONS:  1. Heparin 5000 units every eight hours.  2. Dilaudid PCA.  3. Claritin 10 mg daily.  4. Macrobid 100 mg daily.  5. Lyrica 50 mg at bedtime.   ALLERGIES:  1. PENICILLIN.  2. MORPHINE.  3. PHENERGAN.   FAMILY HISTORY:  Mother deceased at age 35 of pancreas cancer.  Ms.  Danae Barker is the oldest of 56 children.   SOCIAL HISTORY:  [name] Barker lives in [place].  She is married.  She has six children.  A son has prostate cancer.  A daughter has  epilepsy.  She has no history of tobacco or ETOH use.   REVIEW OF SYSTEMS:  [name] Barker denies any weight loss or anorexia.  No fever or sweats.  Prior to surgery she was having occasional right  upper quadrant pain.  No shortness of breath or cough.  No recent change  in bowel habits.  She relays a history of chronic constipation.  No  hematochezia or melena.  No nausea or vomiting.  No hematuria  or  dysuria.   PHYSICAL EXAMINATION:  VITAL SIGNS:  Temperature 98.1, heart rate 52,  respirations 16, blood pressure 149/70, oxygen saturation 96% on room  air.  GENERAL:  Pleasant, elderly female in no acute distress.  CHEST:  Lungs clear anteriorly.  CARDIOVASCULAR:  Regular rate and rhythm.  ABDOMEN:  Soft.  Midline incision with staples.  No organomegaly.  EXTREMITIES:  No edema.  NEUROLOGIC:  Alert and oriented.  Good __________  extremities.   LABORATORY DATA:  Hemoglobin 8.8, MCV 81, white count 14.2, platelet  count 254,000.  Sodium 134, potassium 4, BUN 7, creatinine 0.48, glucose  153, calcium 8.1.   Preoperative lab work on May 06, 2007:  Hemoglobin 9.5, MCV 82, white  count 4.8, platelet count 293,000.  Sodium 137, potassium 3.7, BUN 11,  creatinine 0.57, glucose 87, bilirubin 0.4, alkaline phosphatase 56,  SGOT 24, SGPT 10, total protein 6.7, albumin 3.4, calcium  8.7.   Lab work November 17, 2006:  CEA 1.5.   IMPRESSION AND PLAN:  1. Colon cancer (T3, N1), status post partial colectomy May 11, 2007.  2. History of colon cancer in 1994, status post surgery by Dr.      Marilynn Rail at [facility] and      chemotherapy with 5-FU/leucovorin/ Levamisole under the direction      of Dr. Mariel Sleet.  3. Microcytic anemia.  4. Degenerative disk disease, status post L2-L3 decompressive      laminectomy July 10, 2006.  5. Small bowel obstruction with lysis of adhesions, February 2006.   [name] Barker has been diagnosed with stage III colon cancer.  Dr.  Truett Perna reviewed the pathology report with [name] Barker and her  daughter.  The survival benefit associated with 5-FU based chemotherapy  was discussed.  Dr.  Truett Perna discussed Xeloda, and Fowlpox chemotherapy.  We will schedule  outpatient follow-up at the cancer center in 2-3 weeks.   The patient was interviewed and examined by Dr. Truett Perna; chart/plan  reviewed.      Lonna Cobb, N.P.      Leighton Roach. Truett Perna, M.D.  Electronically Signed    LT/MEDQ  D:  06/14/2007  T:  06/14/2007  Job:  [id number]   cc:   Venita Lick. Russella Dar, MD, FACG  [address]  [place]  [place] [address]   Valetta Mole. Swords, MD  [address] [place]  [place] [address]   Thornton Park Daphine Deutscher, MD  [address], [address]  [place]  [place] [address]

## 2011-05-13 NOTE — Op Note (Signed)
[name] Barker, [name] Barker           ACCOUNT NO.:  [id number]   MEDICAL RECORD NO.:  [id number]          PATIENT TYPE:  INP   LOCATION:  X004                         FACILITY:  [facility]   PHYSICIAN:  Thornton Park. Daphine Deutscher, MD  DATE OF BIRTH:  [birth year]   DATE OF PROCEDURE:  05/11/2007  DATE OF DISCHARGE:                               OPERATIVE REPORT   PREOPERATIVE DIAGNOSIS:  Cancer in a transverse colon remnant.   POSTOPERATIVE DIAGNOSIS:  Cancer in a transverse colon remnant.   PROCEDURE:  Open partial colectomy with primary anastomosis.   SURGEON:  Thornton Park. Daphine Deutscher, MD   ASSISTANT:  Sandria Bales. Ezzard Standing, M.D.   ANESTHESIA:  General endotracheal.   ESTIMATED BLOOD LOSS:  Minimal.   DESCRIPTION OF PROCEDURE:  The patient is an 77 year old white female  who had a right hemicolectomy at [facility] in 1994 followed by  chemotherapy by Ladona Horns. Neijstrom, MD.  She had a bowel obstruction in  February 2006 managed operatively by Dr. Orson Slick requiring short segment  small intestine to be resected.  Recently she had a colonoscopy by Dr.  Russella Dar and found this tumor in her transverse colon.   She was seen in the office, evaluated preop informed consent was  obtained.  She was taken to OR 6 given general anesthesia.  Abdomen was  prepped with Techni-Care and draped sterilely.  I made an upper midline  incision excising her old scar entering her abdomen without difficulty.  I did enterolysis taking down the omentum which was stuck up to the  anterior abdominal wall.  I divided it distally so that I could reflect  the transverse colon and identified the previous ileotransverse  connection.  The tumor was actually right at that connection I could  feel it.  Feel to be about golf ball sized mass.  There no palpable  lymph nodes that I could feel.  We then divided bowel proximally  dividing the small bowel with a GIA and distal to the tumor to get a  good margin.  I then went to the mesentery  with the LigaSure and then  resected specimen.  A functional end-to-end anastomosis was done using a  GIA laying the bowel up side-to-side and over creating openings and  firing it.  I inspected the staple line.  It was not bleeding.  The  common defect was then closed in two layers 4-0 PDS and 3-0 silk.  The  mesenteric defect was closed with 2-0 silk.  We changed our gloves.  There was essentially no spillage.  We irrigated well.  Everything else  was in order and we closed the abdomen with #1 Novofils from above and  below irrigating the wound, injected the  fascia with some Marcaine and closed the skin with staples.  The patient  seemed to tolerate the procedure well was taken to recovery room in  satisfactory condition.   FINAL DIAGNOSIS:  Carcinoma of the proximal transverse colon pathology  pending.      Thornton Park Daphine Deutscher, MD  Electronically Signed     MBM/MEDQ  D:  05/11/2007  T:  05/11/2007  Job:  [id number]   cc:   Valetta Mole. Swords, MD  [address] [place]  [place] [address]   Venita Lick. Russella Dar, MD, FACG  [address]  [place]  [place] [address]

## 2011-05-16 NOTE — Procedures (Signed)
[place]. [facility]  Patient:    [name] Barker                     MRN: [id number] Proc. Date: 01/14/00 Adm. Date:  [id number] Attending:  Cain Sieve CC:         Vania Rea. Supple, M.D.             Edwin Cap. Zoila Shutter, M.D.                           Procedure Report  HISTORY:  The patient is a 77 year old white female with a diagnosis of severe degenerative joint disease, scheduled for total knee replacement and for whom epidural analgesia in the postoperative period has been explained, understood and accepted in the preoperative period.  ANESTHESIOLOGIST:  Edwin Cap. Zoila Shutter, M.D.  DESCRIPTION OF PROCEDURE:  After termination of surgery, while still under general anesthesia, Ms. Yardley was turned to the right lateral decubitus position. er back was prepped with Betadine and draped in the usual sterile fashion.  Using oss of resistance technique and midline approach, a peridural tap was accomplished t the L2-3 interspace with a 17 gauge Tuohy needle.  After aspiration for blood and CSF were negative, a total of 10 cc of 0.5% lidocaine with 100 mcg of fentanyl as infused with no problems.  This was followed by the passage of a peridural catheter 5 cm cephalad.  The catheter was then checked for patency and affixed to the patients back.  The patient was then returned to the supine position, awakened nd brought to the post anesthesia care unit where her peridural catheter will be connected to an infusion pump with a mixture of fentanyl ____________ and Marcaine 1/16% at an initial rate of 10 cc per hour to be adjusted as needed.  There were no complications, she did well and will be followed in the usual fashion. DD:  01/14/00 TD:  01/14/00 Job: [id number]

## 2011-05-16 NOTE — H&P (Signed)
[name], [name]           ACCOUNT NO.:  [id number]   MEDICAL RECORD NO.:  [id number]           PATIENT TYPE:  INP   LOCATION:                               FACILITY:  [facility]   PHYSICIAN:  Bruce Rexene Edison. Swords, M.D. [facility] OF BIRTH:  [birth year]   DATE OF ADMISSION:  01/30/2005  DATE OF DISCHARGE:                                HISTORY & PHYSICAL   [name] is an 77 year old female who has enjoyed reasonable health,  woke up this morning with diffuse abdominal pain.  It started off as  intermittent cramping abdominal pain, now is constant pain for the past 3-4  hours and associated with multiple episodes of dry heaves, nausea and  emesis.  She denies any unusual foods yesterday.  Her last bowel movement  was x2 yesterday which she describes as normal.  She has not noted any GI  blood loss, either hematemesis or blood per rectum.  She denies localization  of the pain.  She denies any fevers or chills.  She feels poorly throughout,  but she relates that to multiple episodes of dry heaves.  It is worth noting  that she does have a history of bowel adhesions requiring hospitalization in  the past.   PAST MEDICAL HISTORY:  Significant for colon cancer status post partial  colectomy in 1994, had an appendectomy in the 1950s.  She had an enterocele  recently.  She has a history of gastroesophageal reflux disease,  osteoporosis, anemia, hyperlipidemia.  She has had arthroscopic knee surgery  and shoulder surgery.   CURRENT MEDICATIONS:  1.  Premarin vaginal cream.  2.  Prevacid 30 mg p.o. daily.  3.  Hydrocodone p.r.n. (chronic back pain).  4.  Aspirin 81 mg p.o. every other day.  5.  Senna p.r.n.  6.  Calcium 1200 mg p.o. daily.  7.  Vitamin B 400 international units daily.  8.  Forteo 1 injection daily.  9.  Advair 100/50 1 puff b.i.d.   SOCIAL HISTORY:  She is married.  She is a nonsmoker.   FAMILY HISTORY:  Noncontributory.   REVIEW OF SYSTEMS:  She denies any chest  pain, shortness of breath, PND or  orthopnea.  She complains of abdominal pain, nausea and vomiting as above.  She denies any current back pain.  She denies any lower extremity edema,  rashes, neurologic deficits, or any other complaints in the review of  systems other than those listed above.   PHYSICAL EXAMINATION:  VITAL SIGNS:  Temperature 97.4, respirations 16,  pulse 80.  GENERAL:  She appears well-developed, well-nourished elderly female in  moderate acute distress.  Multiple episodes of dry heaves during the  examination.  HEENT:  Atraumatic, normocephalic, extraocular muscles are intact.  There is  no icterus noted.  NECK:  Supple without lymphadenopathy, thyromegaly, jugular venous  distention or carotid bruits.  CHEST:  Clear to auscultation without any increased work of breathing.  CARDIAC:  S1 and S2 are normal without murmurs or gallops.  ABDOMEN:  Overweight, quiet bowel sounds, soft, nontender, no  hepatosplenomegaly.  NEUROLOGIC:  She is alert and oriented without any  motor or sensory  deficits.  She is walking without difficulty.  DERMATOLOGIC:  No rashes.  MENTAL STATUS:  Appears normal and her affect is normal.   ASSESSMENT/PLAN:  Acute onset abdominal discomfort of unknown etiology.  High likelihood of small bowel obstruction from adhesions.  Will  hospitalize, obtain acute abdominal series, order appropriate labs, keep  n.p.o. and order IV fluids.  Will use Dilaudid for pain control, Zofran for  nausea and vomiting.  Will continue with Protonix (PPI), but will use IV.  Will follow up on the appropriate tests and make further decisions after  that.      BHS/MEDQ  D:  01/30/2005  T:  01/30/2005  Job:  [id number]

## 2011-05-20 NOTE — Op Note (Signed)
[place]. [facility]  Patient:    [name] Barker, [name] Barker                  MRN: [id number] Proc. Date: 04/02/00 Adm. Date:  [id number] Attending:  Cain Sieve                           Operative Report  PREOPERATIVE DIAGNOSES: 1. Right shoulder full-thickness rotator cuff tear. 2. Right shoulder impingement syndrome. 3. Right shoulder acromioclavicular joint arthrosis.  POSTOPERATIVE DIAGNOSES: 1. Right shoulder full-thickness rotator cuff tear. 2. Right shoulder impingement syndrome. 3. Right shoulder acromioclavicular joint arthrosis.  PROCEDURE: 1. Open right shoulder rotator cuff repair. 2. Subacromial decompression. 3. Distal clavicle resection.  SURGEON:  Vania Rea. Supple, M.D.  ASSISTANT:  Alexzandrew L. Perkins, P.A.-C.  ANESTHESIA:  General endotracheal as well as a preop interscalen block for postop analgesia.  ESTIMATED BLOOD LOSS:  100 cc.  DRAINS:  None.  HISTORY:  [name] Barker is a 77 year old female who has had a long history of right shoulder pain with a recent increase in functional limitations as well as weakness, with clinical examination showing pain with resisted testing of the rotator cuff, positive impingement sign and evidence for rotator cuff weakness.  Preoperative MRI scan does confirm a large full-thickness retracted tear of the rotator cuff. She is brought to the operating room at this time for the above-outlined procedure.  Preoperatively, she had been counseled on treatment options as well as risks versus benefits thereof.  Possible complications of bleeding, infection, neurovascular  injury, recurrence of rotator cuff tear, persistence of pain, loss of motion and possible need for revision surgery were reviewed.  She understands and accepts nd agrees with the planned procedure.  PROCEDURE IN DETAIL:  After undergoing routine preop evaluation, patient had an  interscalen block placed in the holding  area by the anesthesia department.  She was then placed supine on the operating table where she underwent subsequent induction of general endotracheal anesthesia.  She received 500 mg IV vancomycin prophylactically.  She was moved to the beach chair position and appropriately padded and protected.  The right shoulder girdle region was then sterilely prepped and draped in standard fashion.  A 6-cm anterolateral incision was then made, beginning at the [facility] joint and extending laterally and distally.  Skin flaps were elevated medially and laterally.  I should mention that approximately 8 cc of 0.5% Marcaine with epinephrine were instilled along the line of the proposed skin incision at the beginning of the case for hemostasis.  At this point, after the  skin incision was made, electrocautery was used for hemostasis.  The anterior deltoid was then split in line with its fibers from the [facility] joint laterally and distally along the line of the skin incision.  The distal clavicle was then exposed in a subperiosteal fashion and a distal centimeter was removed utilizing an oscillating saw.  Next, an anteroinferior acromionectomy was performed, creating a type 1 acromial morphology and removing the osteophytes which were emanating from the region of the [facility] joint.  Subacromial bursectomy was performed.  She was noted to have a large tear of the rotator cuff involving the entire supraspinatus and the upper portion of the subscapularis.  The biceps tendon was frayed, with approximately 30% of the tendon having been degenerated.  The biceps tendon was  debrided sharply down to healthy-appearing tissue.  The glenohumeral joint was inspected and irrigated.  The  rotator cuff was then mobilized.  The majority of the rotator cuff tear was a side-to-side split and this was repaired using figure-of-eight #2 Ethibond sutures.  At the apex of the split, just posterior o the bicipital groove, a bony trough  was then made approximately 2 cm in width.  This was contoured to allow smooth transition from the rotator cuff into the bony bed.  The free margin of rotator cuff was then mobilized and the infraspinatus as mobilized, detached from its greater tuberosity insertion and detached to allowed it to mobilize anteriorly, to close the defect in the rotator cuff.  The free margin of the rotator cuff involving primarily the supraspinatus was then brought down into the cancellous bony bed through drill holes in the greater tuberosity. All sutures were then tied, showing excellent reapposition of the free margin of the rotator cuff as well as the rotator cuff back down into the bony trough in he greater tuberosity.  The arm was taken through a range of motion and showed no evidence for impingement or excessive tension with the arm at the side.  Final irrigation was performed.  Electrocautery was used for hemostasis.  The wound was then closed in layers with figure-of-eight #1 Vicryl for the deltoid and 2-0 Vicryl for the subcu and staples applied to the skin.  Adaptic and a dry dressing were  then taped over the right shoulder.  The right arm was then placed into a shoulder immobilizer.  Patient was then extubated and taken to the recovery room in stable condition. DD:  04/02/00 TD:  04/03/00 Job: [id number]

## 2011-05-20 NOTE — Discharge Summary (Signed)
[place]. [facility]  Patient:    [name] Barker                     MRN: [id number] Adm. Date:  [id number] Disc. Date: [id number] Attending:  Herold Harms Dictator:   Grayland Jack, P.A.                           Discharge Summary  ADMISSION DIAGNOSES: 1. End-stage osteoarthritis of the left knee. 2. History of colon cancer. 3. Gastroesophageal reflux disease. 4. Hypercholesterolemia. 5. Mild asthma. 6. Vertigo.  DISCHARGE DIAGNOSES: 1. Status post left total knee replacement arthroplasty. 2. History of colon cancer. 3. Gastroesophageal reflux. 4. Hypercholesterolemia. 5. Mild asthma. 6. Vertigo.  CONSULTS:  Internal medicine, Pricilla Riffle. Trisha Mangle, M.D.  PROCEDURES:  This patient underwent a left total knee arthroplasty utilizing Osteonics size #7 cruciate retaining femur, size 7 tibial tray with a 10 mm tibial insert and 26 mm patella.  BRIEF HISTORY:  This is a 77 year old female with a longstanding history of left knee pain.  It has been progressive in nature.  It has failed conservative treatments, including oral anti-inflammatories and multiple steroid injections.  She also underwent left knee arthroscopy and debridement of degenerative meniscal tears.  With the progressive nature of her pain and symptoms and x-rays revealing significant lateral joint space narrowing, it was felt that this patient would best benefit from surgical intervention.  The risks, benefits, and complications of surgery were discussed with the patient in detail and she agreed to proceed.  HOSPITAL COURSE:  On January 14, 2000, the patient underwent the above-stated procedure.  She tolerated this well without complications.  The patient was placed on Coumadin therapy per pharmacy protocol postoperatively for DVT prophylaxis.  Physical therapy was initiated postoperatively for ambulation and transfers.  The patient was placed on Percocet postoperatively,  which caused some itching, so that was switched to [facility] which the patient tolerated well.  The patient did have a fentanyl epidural which was removed on postoperative day #2 per anesthesia.  On January 15, 2000, the patients hematocrit was at 25.  She was transfused two units packed red blood cells which she tolerated well.  On January 16, 2000, the patients hemoglobin was at 10.0 and hematocrit at 28.8, status post two units.  Her sodium was at 129 and potassium at 3.3.  The patient was placed on fluid restrictions and Dr. Valetta Mole. Swords office was called for consult.  Pricilla Riffle. Triplett, M.D., was in to see the patient and took care of any medical problems for the remainder of the patients hospital stay.  On January 17, 2000, the patient was without complaints of pain or itching.  She was afebrile and her vital signs were stable.  On exam, her dressings were dry and her incisions were clean and dry with no erythema or discharge.  She was neurovascularly intact to the left lower extremity.  Her abdomen was soft and nontender.  She had a slight rash on her back that was consistent with a drug reaction possibly from the fentanyl epidural.  It was felt that the patient was stable for transfer to rehabilitation at this time.  LABORATORY DATA:  The preoperative CBC revealed a slightly decreased hemoglobin at 11.7 and hematocrit at 35.4.  Postoperatively these fell to a low of 8.6 hemoglobin and 25.4 hematocrit.  The patient was transfused with two units to which she responded nicely  and remained stable at 10.0 and 29.5, respectively.  Preoperative PT and INR of 13.7 and 1.1, respectively.  The patient was placed on Coumadin per pharmacy protocol and pharmacy followed her PTs and INRs.  Prior to discharge, the patient had a PT of 21.9 and an INR of 2.7.  Routine chemistries preoperatively were within normal limits.  These fell to a low of a sodium of 129 and a potassium of 3.3.   However, the patient responded nicely and was stable prior to discharge with a sodium of 138 and a potassium of 4.4.  On preoperative laboratory work, urinalysis revealed leukocyte esterase trace, a few epithelials, and 0-5 white blood cells.  A repeat postoperative urinalysis revealed some protein and some bacteria.  Radiology:  The preoperative chest x-ray revealed no acute disease. Cardiology:  The preoperative EKG was normal sinus rhythm.  CONDITION ON DISCHARGE:  Improved and stable.  DISPOSITION:  This patient is being transferred to inpatient rehabilitation at [facility].  ACTIVITIES:  The patient is to remain weightbearing as tolerated.  DISCHARGE MEDICATIONS:  She is to remain on Coumadin therapy per pharmacy protocol for four weeks.  The patient is to resume home medications.  WOUND CARE:  The patient may shower ad lib, keeping the incision site clean and dry.  DIET:  The patient has no restrictions on diet.  FOLLOW-UP:  The patient is to return to the clinic in two weeks to follow up with Vania Rea. Supple, M.D.  Please call [phone] for appointment. DD:  03/05/00 TD:  03/06/00 Job: [id number]

## 2011-05-20 NOTE — Discharge Summary (Signed)
NAMEGUDELIA, [name] Barker           ACCOUNT NO.:  [id number]   MEDICAL RECORD NO.:  [id number]          PATIENT TYPE:  INP   LOCATION:  3007                         FACILITY:  [facility]   PHYSICIAN:  Kathaleen Maser. Pool, M.D.    DATE OF BIRTH:  [birth year]   DATE OF ADMISSION:  07/10/2006  DATE OF DISCHARGE:  07/13/2006                                 DISCHARGE SUMMARY   FINAL DIAGNOSIS:  L2-3 degenerative disk disease with generous scoliosis.   HISTORY OF PRESENT ILLNESS:  [name] is an 77 year old female with  history of severe back pain failing all conservative management.  Workup  demonstrates evidence of degenerative disk disease with instability and  deformity at L2-3.  The patient presents now for decompression and fusion.   HOSPITAL COURSE:  The patient was taken to the rule out where an  uncomplicated L2-3 decompression fusion was performed.  Postoperatively, the  patient did very well.  The pain was much improved.  Strength and sensation  were intact.  She was gradually mobilized.  At the time of discharge she is  ambulating without difficulty.  Back pain is much improved.  She is overall  very happy with her current progress.   DISCHARGE DISPOSITION:  The patient will be discharged home.  She will  follow up in my office in one week.           ______________________________  Kathaleen Maser. Pool, M.D.     HAP/MEDQ  D:  07/28/2006  T:  07/28/2006  Job:  [id number]

## 2011-05-20 NOTE — Op Note (Signed)
[place]. [facility]  Patient:    [name] Barker, [name] Barker                  MRN: [id number] Proc. Date: 04/03/00 Adm. Date:  [id number] Attending:  Cain Sieve                           Operative Report  NO DICTATION DD:  04/03/00 TD:  04/03/00 Job: [id number]

## 2011-05-20 NOTE — Discharge Summary (Signed)
[facility]  Patient:    [name] Barker, [name] Barker Visit Number: [id number] MRN: [id number]          Service Type: SUR Location: 4E 0401 01 Attending Physician:  Marcelle Overlie Dictated by:   Marcelle Overlie, M.D. Admit Date:  06/28/2001 Discharge Date: 07/01/2001                             Discharge Summary  ADMISSION DIAGNOSES: 1. Uterine prolapse. 2. Cystocele. 3. Rectocele. 4. Genuine stress urinary incontinence.  DISCHARGE DIAGNOSES: 1. Uterine prolapse. 2. Cystocele. 3. Rectocele. 4. Genuine stress urinary incontinence.  HISTORY OF PRESENT ILLNESS:  The patient is a 77 year old gravida 7, para 6, who presents for a total vaginal hysterectomy, bilateral salpingo-oophorectomy, pubovaginal sling, and AP repair.  She also complains of genuine stress urinary incontinence.  PAST MEDICAL HISTORY:  History of colon cancer.  She is status post a right hemicolectomy in 1994.  Status post 5-FU and XRT.  HOSPITAL COURSE:  The patient went to the operating room and underwent a LAVH, BSO, anterior and posterior repair, and pubovaginal sling.  Dr. Patsi Sears performed a pubovaginal sling and the anterior repair.  The patient did very well intraoperatively, and also did very well postoperatively.  By postoperative day #1, her postoperative hematocrit was 25.9.  She was tolerating clears and ambulating with a good diet.  By postoperative day #2, she had small low grade temperature which was felt secondary to atelectasis, but she had flatus and was tolerating a regular diet.  The patient was discharged home on postoperative day #3.  She had been afebrile and was tolerating a regular diet, ambulating, and had good pain control.  She was discharged home with instructions to follow up in 4 to 6 weeks with Dr. Vincente Poli, and to follow up with Dr. Patsi Sears in his office.  DISCHARGE MEDICATIONS: 1. Flomax. 2. Tylox p.r.n. pain. 3. Advil p.r.n. pain.  She  was advised to call if she has any nausea, vomiting, severe abdominal pain, temperature greater than 100.5, or any drainage from her vagina. Dictated by:   Marcelle Overlie, M.D. Attending Physician:  Marcelle Overlie DD:  09/14/01 TD:  09/14/01 Job: [id number]

## 2011-05-20 NOTE — Discharge Summary (Signed)
[name] Barker, REDMANN           ACCOUNT NO.:  [id number]   MEDICAL RECORD NO.:  [id number]          PATIENT TYPE:  INP   LOCATION:  0449                         FACILITY:  [facility]   PHYSICIAN:  Rene Paci, M.D. LHCDATE OF BIRTH:  [birth year]   DATE OF ADMISSION:  01/30/2005  DATE OF DISCHARGE:  02/11/2005                                 DISCHARGE SUMMARY   DISCHARGE DIAGNOSES:  1.  Small-bowel obstruction, status post lysis of adhesions and exploratory      laparotomy, February 5th, by Dr. Orson Slick, postoperative ileus now      resolved, tolerating p.o., no abdominal pain, stable for discharge.  2.  Urinary tract infection, status post 7 days of Cipro.  3.  Remote history of colon cancer, status post partial colectomy.  4.  Anemia, multifactorial, hemoglobin and hemodynamically stable, discharge      hemoglobin 10.1.  5.  History of gastroesophageal reflux disease.  6.  History of dyslipidemia.  7.  History of osteoporosis.   DISCHARGE MEDICATIONS:  The same as prior to admission and include Premarin  vaginal cream, Prevacid, Vicodin p.r.n., baby aspirin, Senna, calcium,  vitamin B, __________. Injection daily and Advair 100/50 mcg b.i.d.   CONDITION ON DISCHARGE:  Medically stable.   HOSPITAL FOLLOWUP:  The patient and family are to call Dr. Orson Slick in 2-3  weeks.  Also with Dr. Cato Mulligan, primary care physician, as needed or as  previously scheduled.   HOSPITAL COURSE:  Small-bowel obstruction.  The patient is an 77 year old  woman who presented to her primary care physician the day prior to admission  with abdominal pain.  Due to history of multiple surgeries as well as  history of small-bowel obstruction, she was admitted for further evaluation  and found to have dilated small-bowel loops consistent with early  obstruction.  She was treated with medical conservative management, NG  suction and bowel rest, which failed to resolve her symptoms.  Thus, an  exploratory lap. was  undertaken February 5th by Dr. Orson Slick with lysis of  adhesions performed.  Her post-op course was complicated with hypokalemia  and prolonging ileus, which improved with replacement of potassium and  minimizing of narcotics.  At the time of discharge, she has been without NG  tube for over 72 hours, tolerating p.o. for 48 hours, and abdominal pain has  resolved.  No nausea or vomiting.  Stable for discharge per surgery and  medicine.  Other medications are as to prior to admission without change.      VL/MEDQ  D:  02/11/2005  T:  02/11/2005  Job:  [id number]

## 2011-05-20 NOTE — Op Note (Signed)
NAME:  [name] Barker, [name] Barker                     ACCOUNT NO.:  [id number]   MEDICAL RECORD NO.:  [id number]                   PATIENT TYPE:  OBV   LOCATION:  9319                                 FACILITY:  [facility]   PHYSICIAN:  Michelle L. Vincente Poli, M.D.            DATE OF BIRTH:  1921-04-29   DATE OF PROCEDURE:  03/14/2004  DATE OF DISCHARGE:  03/15/2004                                 OPERATIVE REPORT   PREOPERATIVE DIAGNOSIS:  Enterocele.   POSTOPERATIVE DIAGNOSIS:  Enterocele.   PROCEDURE:  Enterocele repair.   PROCEDURE:  The patient was taken to the operating room, where she was given  anesthesia without difficulty.  Of note, the patient had her left leg  positioned carefully during the procedure.  The lower abdomen, vagina, and  vulva were prepped and draped in the usual sterile fashion.  A Foley  catheter was inserted in the bladder.  Exam under anesthesia revealed a well-  suspended cystocele and no evidence of rectocele.  The patient had a very  small enterocele that was approximately 2 x 2 cm in diameter.  Actually,  with her lying supine it went down almost to the lower third of the vagina.  We placed a retractor in the vagina and the vagina over the enterocele was  grasped on either side using Allis clamps.  A transverse incision with a  scalpel was made and Metzenbaum scissors were used to dissect the overlying  vaginal epithelium free from the enterocele sac.  There was no bowel trapped  in the enterocele sac, so we then reduced the enterocele sac nicely in this  space and used a series of pursestring sutures using 2-0 Vicryl suture,  using three pursestring sutures, and closed the enterocele sac with good  effect.  At this point I then placed a small square of mesh at the base of  the enterocele and affixed it to the fascia using four stitches of #1  Prolene suture.  The excess vaginal epithelium was excised.  The vagina was  closed with a continuous stitch of 2-0 Vicryl.   No vaginal bleeding was  noted.  All sponge, lap, and instrument counts were correct x2.  A vaginal  pack soaked in Estrace was placed in the vagina.  The patient tolerated the  procedure well and was transferred to the recovery room in stable condition.                                               Michelle L. Vincente Poli, M.D.    Florestine Avers  D:  03/25/2004  T:  03/25/2004  Job:  [id number]

## 2011-05-20 NOTE — Discharge Summary (Signed)
[place]. [facility]  Patient:    [name] Barker                     MRN: [id number] Adm. Date:  [id number] Disc. Date: [id number] Attending:  Herold Harms Dictator:   Dian Situ, PA CC:         Valetta Mole. Swords, M.D. [facility]             Vania Rea. Supple, M.D.                           Discharge Summary  DISCHARGE DIAGNOSES: 1. Status post left total knee replacement. 2. Hyponatremia, resolving. 3. Postoperative anemia, stable. 4. Nausea, resolving. 5. Postoperative confusion, resolved.  HISTORY OF PRESENT ILLNESS:  The patient is a 77 year old female with history of colon cancer, recently diagnosed vertigo, DJD, status post left knee arthroscopy, and no relief with conservative therapy.  She elected to undergo left total knee replacement January 14, 2000, by Dr. Rennis Chris.  Postoperatively, she is weightbearing and ______ and on Coumadin for DVT prophylaxis.  She has had some issues with hyponatremia with sodium going down to 129.  She had a drop in her hemoglobin and hematocrit requiring two units of packed red blood cells as well as postoperative confusion.  The patients mental status is improving currently.  The patient is minimal assistance for transfers, assistance for ambulating 20 feet with standard walker.  PAST MEDICAL HISTORY:  Significant for colon cancer status post resection, GERD, hypercholesterolemia, mild asthma, recent vertigo, appendectomy, hysterectomy.  SOCIAL HISTORY:  The patient is married and lives with her husband in apartment, a one-level home with three steps to entry.  She was independent prior to admission. She occasionally uses a cane for ambulation.  She does not use any tobacco or alcohol.  HOSPITAL COURSE:  The patient was admitted to rehabilitation on January 17, 2000, for inpatient therapies to consist of PT and OT daily.  Past admission, the patient has been continued on Coumadin for DVT  prophylaxis.  Past admission labs showed  hemoglobin 9.1 which was ______ .  Her sodium had again further drop at 130. The patients p.o. intake has been poor, and she has been encouraged to do this and family has been instructed regarding bringing in regular home diet. Recheck of BC on January 21 showed hemoglobin and hematocrit to be slightly improved with hemoglobin 9.5, hematocrit 27.2.  BMP of January 22 showed sodium 136, potassium 3.4, chloride 101, CO2 27.  The patient had bilateral lower extremity duplex done that showed no evidence of DVT.  She had been maintained on Coumadin for DVT prophylaxis, and INR was noted to be slightly supertherapeutic requiring adjustment in Coumadin dose.  The patient is to continue on Coumadin through January 27 to  complete her DVT prophylaxis.  Pro times are to be drawn in Dr. Cato Mulligan office this Friday.  Dr. Cato Mulligan has agreed to follow the patient INR and adjust Coumadin as  needed.  The patients left knee incision is healing well without any signs or symptoms of infection.  No drainage, no erythema noted.  The patient has done well in therapy.  Mental status has gradually improved, and mood is stable.  P.O. intake has definitely improved.  She has done well in therapy.  She is moderately independent in her room without any problems encountered.  She is moderately independent for ADL needs.  Outpatient therapies are  recommended and has been set up at [facility] to begin on Friday, January 26 at 8 a.m.  The patient has had some complaints of right shoulder pain which has been relieved with the use of a heating pad.  The patient is to continue progressive therapy status post discharge.  On January 22, 2000, the patient is discharged to home.  DISCHARGE MEDICATIONS: 1. Coumadin 2 mg alternating with 1 mg every other day. 2. Multivitamin with iron 1 per day. 3. Lipitor 10 mg q.h.s. 4. Prevacid 20 mg per day. 5.  Tylenol 325 mg 1 to 2 p.o. q.4-6h. p.r.n. pain. 6. Darvocet-N 100 one to two p.o. q.4-6h. p.r.n. severe pain.  DIET:  Regular.  ACTIVITY:  Use walker.  Weightbearing as tolerated on left lower extremity.  SPECIAL INSTRUCTIONS:  No alcohol, no smoking, no driving.  No aspirin or aspirin products, no NSAIDs while on Coumadin.  Keep incision clean and dry.  FOLLOWUP:  The patient is to follow up with Dr. Rennis Chris in the next two to four ays for staple removal.  Follow up with Dr. Cato Mulligan on Friday for pro time draws and  Monday for a recheck appointment.  Follow up with Dr. Lamar Benes as needed. DD:  01/21/00 TD:  01/22/00 Job: [id number]

## 2011-05-20 NOTE — Consult Note (Signed)
[name] Barker, [name] Barker           ACCOUNT NO.:  [id number]   MEDICAL RECORD NO.:  [id number]          PATIENT TYPE:  INP   LOCATION:  0449                         [facility]ACILITY:  [facility]   PHYSICIAN:  Lorre Munroe., M.D.DATE OF BIRTH:  November 25, 1921   DATE OF CONSULTATION:  01/31/2005  DATE OF DISCHARGE:                                   CONSULTATION   CHIEF COMPLAINT:  Abdominal pain.   PRESENT ILLNESS:  The patient is an 77 year old white female who has had  colon cancer surgery and a hysterectomy and has a history of small-bowel  obstruction in the past which resolved nonoperatively.  For a couple of days  she has had increasing crampy abdominal pain and now she has protracted  vomiting and obstipation.  She was admitted to the hospital yesterday for  care of that problem.  Abdominal x-rays yesterday and today suggest  incomplete but fairly significant small intestinal obstruction.  No masses  are seen.  White count yesterday was 16,000.  It has come down to 8000  today.  She has not seen any blood in the stool and has not vomited any  blood.  No fever or chills.   PAST MEDICAL HISTORY:  Her colon cancer surgery was in 1994.  She has had an  appendectomy in the past.  She recently had repair of an enterocele.  There  is history of mild anemia, hyperlipidemia, osteoporosis, GERD.  She has also  had some joint surgery on the shoulder and knee.   MEDICINES:  1.  Premarin vaginal cream.  2.  Prevacid 30 mg daily.  3.  Hydrocodone as needed for back pain.  4.  Aspirin 81 mg every-other day.  5.  Senna as needed for bowel movements.  6.  Calcium.  7.  Vitamins.  8.  Forteo injections.  9.  Occasional Advair for wheezing.   The patient does not smoke or drink.  She is married and husband living.  The patient lives independently.   FAMILY HISTORY AND CHILDHOOD ILLNESSES:  Unremarkable.   REVIEW OF SYSTEMS:  She denies chest pain, shortness of breath, chronic GI  problems, renal  problems, unexplained pains, any other general symptoms  which might be significant.   PHYSICAL EXAMINATION:  VITAL SIGNS:  Temperature and vital signs per nursing  summary.  She is reported afebrile with heart rate around 80.  GENERAL:  The patient is in no acute distress.  She says she does have some  abdominal pain.  Mental status normal.  HEENT:  Head and neck, eyes, ears, nose, mouth, and throat unremarkable with  no neck mass, no supraclavicular mass, no thyromegaly or thyroid nodule.  The mucosal surfaces are somewhat dry.  The sclerae are not icteric.  CHEST:  Clear to auscultation.  No chest wall tenderness.  HEART:  Rate and rhythm were normal.  No murmur or gallop noted.  ABDOMEN:  Somewhat distended.  Diffusely slightly tender.  Bowel sounds were  infrequent, slightly tinkling in nature.  No hernia or masses noted.  EXTREMITIES:  No edema, pulses are full, no ulcers.  SKIN:  No lesions are  noted.  LYMPH NODES:  None enlarged in the groin, axilla, or neck.  NEUROLOGIC:  Grossly normal.   IMPRESSION:  1.  Small-bowel obstruction, probably due to adhesions.  2.  History of colon cancer.  3.  Past history of hysterectomy.   PLAN:  Institution of nasogastric suction and recheck of CBC.  Will follow  along and if the patient seems to deteriorate at all, immediate laparotomy  will be advised.  Otherwise, we hope that she will resolve the obstruction  nonoperatively.      WB/MEDQ  D:  01/31/2005  T:  01/31/2005  Job:  [id number]

## 2011-05-20 NOTE — H&P (Signed)
[place]  Patient:    [name] Barker                     MRN: [id number] Adm. Date:  [id number] Disc. Date: [id number] Attending:  Herold Harms Dictator:   Della Goo, P.A.                         History and Physical  CHIEF COMPLAINT:  Right shoulder pain.  HISTORY OF PRESENT ILLNESS:  The patient is a 77 year old white female, well known to Dr. Vania Rea. Supples practice, as she recently underwent a left total knee replacement in January 2001.  She is also seeing Dr. Rennis Chris for chronic progressive right shoulder pain and dysfunction.  The patient recalls that several years ago when she lived in [place], she did have some shoulder pain which was treated with steroid injections by an orthopedist there.  When she moved to [facility] she has also had injections by Dr. Valetta Mole. Swords to the shoulder.  As her symptoms continued to worsen, and she has developed difficulty with activities of daily living, secondary to the pain, further evaluation was performed and an MRI did how significant pathology of the shoulder.  The patients right shoulder MRI showed diffuse rotator cuff disease with full thickness retracted supraspinatus tear, probably degenerative tear of the superior labrum, with the biceps tendon being  intact.  On plain films she was also noted to have significant AC joint arthrosis. Due to these significant findings, as well as her continued symptoms of pain and dysfunction, it was felt she would require surgical intervention and is being admitted at this time to undergo an open rotator cuff repair of the right shoulder with subacromial decompression and distal clavicle resection.  This is planned o be done under general anesthesia with an interscalene block.  CURRENT MEDICATIONS: 1. Lipitor 10 mg q.o.d. 2. Antivert p.r.n. vertigo. 3. Stool softener q.d. 4. Darvocet-N 100 one q.6h. 5. Claritin 10 mg one q.d. 6.  Prevacid p.r.n. gastroesophageal reflux disease. 7. She is also taking over-the-counter vitamins, including C, D, E, and    Centrum multivitamin.  ALLERGIES:  PENICILLIN AND MORPHINE.  PAST MEDICAL HISTORY: 1. Significant for colon cancer. 2. Gastroesophageal reflux disease. 3. Asthma. 4. Hypercholesterolemia. 5. Vertigo. 6. Status post colectomy in 1995. 7. Status post appendectomy approximately 20 years ago. 8. Status post bilateral cataract removal with lens implants. 9. Status post left total knee replacement in January 2001.  Her family medical doctor is Dr. Cato Mulligan.  She did see Dr. Pricilla Riffle. Triplett while in the hospital for her knee surgery, as she was covering for Dr. Cato Mulligan.  SOCIAL HISTORY:  The patient has no intake of tobacco or alcohol.  She lives with her husband and also has an extended family living situation with other relatives living in the same home.  She is usually independent with activities of daily living except for recently when she has had increasing difficulty, secondary to  shoulder pain.  FAMILY HISTORY:  Noncontributory to the present illness.  REVIEW OF SYSTEMS:  CNS:  The patient denies blurred vision, double vision, seizure disorder, headaches, or paralysis.  Cardiorespiratory:  No chest pain, shortness of breath, cough, or sputum production or hemoptysis.  GI/GU:  She does have occasional heartburn and has Prevacid to use on an as-needed basis.  She has no  nausea, vomiting, diarrhea, or constipation.  She does use a stool  softener to avoid constipation.  She has no dysuria, hematuria, melena, or bloody stools. Musculoskeletal:  As per the history of present illness.  Hematologic:  The patient denies a history of jaundice, hepatitis, anemia, blood clots, or bleeding disorders.  PHYSICAL EXAMINATION:  GENERAL:  She is a well-developed, well-nourished white female, alert and oriented x 4, in no acute distress.  VITAL SIGNS:  Blood  pressure 120/70, pulse 70 and regular.  HEENT:  Normocephalic, atraumatic.  Pupils equal, round, reactive to light and accommodation.  Extraocular movements intact.  Nose without drainage. Oropharynx without edema or erythema.  NECK:  Supple.  No adenopathy or thyromegaly.  LUNGS:  Clear to auscultation.  HEART:  With a regular rate and rhythm.  No murmurs heard.  ABDOMEN:  Soft, nontender.  Bowel sounds present.  GENITOURINARY/BREASTS/RECTAL:  Examinations not performed, not pertinent to the  present illness.  EXTREMITIES:  Examination of the right shoulder reveals globally decreased range of motion and pain, both passively and actively.  She has significant weakness with active motion and strength testing.  There is a positive impingement sign on the right.  The left upper extremity with neurovascular motor function intact.  In he lower extremities the distal pulses are palpated.  The patient has a well-healed surgical scar over the left anterior knee.  She has good range of motion of the  left knee.  There is no cyanosis, clubbing, or edema of the lower extremities.   IMPRESSION: 1. Rotator cuff tear of the right shoulder, with acromioclavicular joint    arthrosis. 2. Status post left total knee replacement in January 2001. 3. History of colon cancer, status post colectomy in 1995. 4. Gastroesophageal reflux disease. 5. Remote history of asthma with no recent exacerbations. 6. Hypercholesterolemia, treated with medication and diet. 7. History of vertigo with intermittent use of Antivert.  PLAN:  The patient will be admitted to the hospital to undergo an open rotator uff repair of the right shoulder, as well as a subacromial decompression and distal  clavicle resection.  If there are medical needs during the hospital stay, we will notify Dr. Cato Mulligan, and most likely the patient will be treated by Dr. Trisha Mangle again, as she covers the hospital for Dr. Cato Mulligan.  We do  plan to do the surgery  under general anesthesia with an interscalene block. DD:  03/26/00 TD:  03/26/00 Job: [id number]

## 2011-05-20 NOTE — Op Note (Signed)
[name] Barker, [name]           ACCOUNT NO.:  [id number]   MEDICAL RECORD NO.:  [id number]          PATIENT TYPE:  INP   LOCATION:  0449                         FACILITY:  [facility]   PHYSICIAN:  Lorre Munroe., M.D.DATE OF BIRTH:  [birth year]   DATE OF PROCEDURE:  02/02/2005  DATE OF DISCHARGE:                                 OPERATIVE REPORT   PREOPERATIVE DIAGNOSIS:  Small-bowel obstruction.   POSTOPERATIVE DIAGNOSIS:  Closed loop small-bowel obstruction with  infarction of segment of small bowel.   OPERATION:  Lysis of adhesions, small bowel resection with anastomosis.   SURGEON:  Lebron Conners, M.D.   ASSISTANT:  Velora Heckler, M.D.   ANESTHESIA:  General.   PROCEDURE:  After the patient was monitored and anesthetized and had a Foley  catheter and routine preparation and draping of the abdomen, I opened a  portion of the previous long midline incision.  I carefully entered the  abdomen and noted that just below the umbilicus there was a cribriform very  small ventral hernia present.  I very carefully took down the contents of  the hernia as I entered the abdomen.  The hernia contained only omentum and  preperitoneal fat.  I then entered the abdominal cavity and took down the  adherent omentum to the anterior abdominal wall and noted dilated small  bowel and noted some dark, slightly bloody, turbid fluid, mostly in the  right side of the abdomen.  There was a palpable mass in the right side of  the abdomen.  I took down the omentum all the way around so as to be able to  completely explore the small bowel and found that the small bowel was  relatively free of adhesions.  The colon had some gas in it and looked  normal.  The stomach had a nasogastric tube in it, and I assisted the  anesthetist in positioning that optimally.  I sucked out the fluid which was  present.  I then mobilized the small bowel out of the right side of the  abdomen and noted that there was a very  dark, obviously infarcted loop of  small intestine caused by herniation through an area of adhesion which was  very tight.  I then lysed that adhesion and freed the small bowel up, but it  did not regain a viable appearance, and with the odor and dark fluid, I was  sure it was infarcted.  I brought the distal and proximal small bowel  together near the infarcted segment and did a side-to-side anastomosis with  the GIA stapler and then divided the bowel across that staple line with a  linear stapler.  I then resected the dead portion of bowel, clamping the  mesentery with Kelly clamps and ligating or suture ligating the vessels with  2-0 silk.  The bowel appeared viable, and the anastomosis was widely patent  and appeared secure.  I closed the mesenteric defect with a single figure-of-  eight stitch of 2-0 silk, and I placed one reinforcing 2-0 silk stitch in  the seromuscular layers of the bowel at the crotch of the anastomosis.  I  then inspected the entire small bowel from the ligament of Treitz down to  the anastomosis of the transverse colon to the small bowel.  No more  obstructions were found.  I did lyse a few adhesions.  I then used my  fingers to milk the gas and fluid in the proximal small bowel back into the  stomach where it was removed through the nasogastric tube.  I then copiously  irrigated the abdominal cavity and removed some of the exudate which was  present and removed the fluid.  I got good hemostasis on the omentum and  other areas.  The sponge, needle, and instrument counts were correct.  I  again inspected the anastomosis and felt that it looked fine.  I closed the  fascia with running #1 PDS suture beginning at  both ends and tying with a simple knot in the middle.  I did mobilize the  subcutaneous tissues slightly in the area of the hernia to facilitate  closure.  I closed the skin loosely with staples and packed gauze between  the interstices to provide  subcutaneous drainage.  I applied a bulky  compressive bandage.  She was stable throughout the case.      WB/MEDQ  D:  02/02/2005  T:  02/02/2005  Job:  [id number]   cc:   Valetta Mole. Swords, M.D. [facility]

## 2011-05-20 NOTE — Op Note (Signed)
. [facility]  Patient:    [name] Barker                     MRN: [id number] Proc. Date: 01/14/00 Adm. Date:  [id number] Attending:  Cain Sieve                           Operative Report  PREOPERATIVE DIAGNOSIS:  Left knee osteoarthrosis with valgus deformity.  POSTOPERATIVE DIAGNOSIS:  Left knee osteoarthrosis with valgus deformity.  PROCEDURE:  Left total knee arthroplasty utilizing an Osteonics size #7 cruciate-retaining femur, a size #7 tibial tray, with a 10.0 mm tibial insert, nd a 26.0 mm patella.  SURGEON:  Vania Rea. Supple, M.D.  ASSISTANT:  Alexzandrew L. Perkins, P.A.-C.  ANESTHESIA:  General endotracheal.  In addition the anesthesia department had planned on placing an epidural for postoperative pain control.  TOURNIQUET TIME:  90 minutes.  DRAINS:  None.  ESTIMATED BLOOD LOSS:  Minimal.  INDICATIONS:  [name] Barker is a 77 year old female who has had longstanding difficulties with left knee pain which had been refractory to attempts at conservative management.  Due to her ongoing pain and functional limitation, she is brought to the operating room at this time for a left total knee arthroplasty.  Preoperatively she had been counseled on treatment options, as well as the risks, versus benefits thereof.  The possible complications of bleeding, infection, neurovascular injury, DVT, persistence of pain, loss of motion, possible need for revision surgery were reviewed.  She understands and accepts, and agrees to the  planned procedure.  DESCRIPTION OF PROCEDURE:  After undergoing routine preoperative evaluation, the patient was placed supine on the operating room table where she underwent a smooth induction of general endotracheal anesthesia.  A Foley catheter was placed. She received 500 mg of IV vancomycin prophylactically.  The tourniquet was applied o the left thigh, and the left leg was sterilely prepped and  draped in the standard fashion.  An anterior midline incision was made from four finger breadths over he patella to just medial to the tibial tubercle.  Skin flaps were elevated medially and laterally.  The medial parapatellar arthrotomy was performed, and the patella was everted.  Approximately 50% fat pad was excised.  The medial and lateral meniscectomies were performed, and the ACL was divided and excised.  The PCL was intact, but it was slightly recessed to improve exposure.  The intramedullary guide was then directed down the femoral canal and a 5-degree valgus cut removing 1.0 cm from the distal femur was then made.  The anterior sizer was then applied and the size #7 had the best fit.  The size #7 cutting guide was then applied to the distal femur, and subsequently the anterior, posterior, and chamfer cuts were then made on the distal femur.  This was followed by the use of the trochlear groove cutting  guide, and the trochlear groove notch was then cut with the osteotome.  Our attention was then directed to the proximal tibia where the tibial spines were removed.  An intramedullary guide was then placed down the tibial canal, and a 5-degree posterior slope cut was then made with appropriate protection of the PCL, removing a total of 4.0 mm from the defect on the lateral tibial plateau. Trial implants were then placed and there was noted to be moderate tension, so an additional 2.0 mm was removed from the proximal tibia.  Following this there  was good soft tissue balancing and symmetric extension and flexion gaps.  The rotation of the tibial implant was then confirmed and the proximal tibia was then exposed and the keel cutting guide was then applied, making the appropriate keel cut on the proximal tibial metaphysis.  Attention was then directed to the patella with a 26.0 mm cutting stylus being sed to create a recessed patellar button defect.  The extraneous bone  and soft tissue were then excised.  At this point the joint was then copiously lavaged with pulsatile irrigation.  The proximal tibia was then exposed, and the meticulously dried.  The implants were then cemented into position, beginning with the tibia, followed by the femur and then the patella.  After the cement had adequately hardened, meticulous debridement of extraneous cement was performed.  The knee as then taken through a range of motion and various single trays were applied, and the 10.0 mm tibial tray had the best soft tissue balance with excellent knee motion. The final 10.0 mm tray was then impacted after the posterior aspect of the knee  joint was cleared.  Also I should mention that the posterior osteophytes on the  distal femur were excised and the posterior compartments were meticulously clean prior to the placement of the implant.  The patella showed symmetric tracking with no tendency for lateral subluxation, so a lateral release was not performed. After a final inspection, and use of the electrocautery for hemostasis, the wound was  closed in layers with a running #1 Vicryl for the synovium, interrupted figure-of-eight #1 Vicryl for the quadriceps and extensor mechanism, #2-0 Vicryl for the subcutaneous, and staples applied to the skin.  Adaptic and a bulky dry  dressing were then wrapped about the knee.  The leg was wrapped from foot to thigh with an ACE bandage.  The tourniquet was let down.  The patient was then transferred to the recovery room in stable condition. DD:  01/14/00 TD:  01/14/00 Job: [id number]

## 2011-05-20 NOTE — Assessment & Plan Note (Signed)
[facility]                         GASTROENTEROLOGY OFFICE NOTE   NAME:[name] Barker, [name] Barker                  MRN:          [id number]  DATE:03/30/2007                            DOB:          1921-06-23    This is a return office visit for chronic constipation and a personal  history of colon cancer.  Her constipation appears to be under good  control on daily Glycolax.  She takes a half of Percocet four times a  day for control of chronic back pain.  Her last colonoscopy was  performed in August, 2004, which showed diverticulosis that was not  complete to the anastomosis due to suspected adhesions.  A subsequent  barium enema revealed marked diverticulosis of the sigmoid colon and a  prior right hemicolectomy with an unremarkable appearing anastomosis.  The preparation was suboptimal and polypoid lesions could not be  definitively excluded.  She has had no recent change in bowel habits,  weight loss, abdominal pain, melena, or hematochezia.   CURRENT MEDICATIONS:  Listed on the chart, updated and reviewed.   MEDICATION ALLERGIES:  MORPHINE, PENICILLIN, and STATIN DRUGS.   PHYSICAL EXAMINATION:  GENERAL:  No acute distress.  VITAL SIGNS:  Weight 138 pounds.  Blood pressure 102/68, pulse 76 and  regular.  HEENT:  Anicteric sclerae.  Oropharynx clear.  CHEST:  Clear to auscultation bilaterally.  CARDIAC:  Regular rate and rhythm without murmurs appreciated.  ABDOMEN:  Soft, nontender, nondistended.  Normoactive bowel sounds.  No  palpable organomegaly, masses, or hernias.  RECTAL:  Deferred to the time of colonoscopy.   ASSESSMENT/PLAN:  1. Chronic constipation and divericulosis:  Maintain Glycolax once or      twice a day as needed for adequate control of symptoms. Long term      high-fiber diet with adequate daily fluid intake.  2. A personal history of colon cancer, status post right      hemicolectomy.  Rule out recurrent colorectal  neoplasms.  Risks,      benefits and alternatives to colonoscopy with possible biopsy and      possible polypectomy discussed with the patient, and she consents      to proceed.  This will be scheduled electively.  She understands      that we may not be able to complete the examination to the      anastomosis; therefore, barium enema or CT colonoscopy may be      needed to complete the exam.     Judie Petit T. Russella Dar, MD, [facility]  Electronically Signed   MTS/MedQ  DD: 03/30/2007  DT: 03/30/2007  Job #: [id number]   cc:   Valetta Mole. Swords, MD

## 2011-05-20 NOTE — Op Note (Signed)
[name] Barker, [name] Barker           ACCOUNT NO.:  [id number]   MEDICAL RECORD NO.:  [id number]          PATIENT TYPE:  INP   LOCATION:  3007                         FACILITY:  [facility]   PHYSICIAN:  [name] Barker, M.D.    DATE OF BIRTH:  [birth year]   DATE OF PROCEDURE:  07/10/2006  DATE OF DISCHARGE:                                 OPERATIVE REPORT   SERVICE:  Neurosurgery.   PREOPERATIVE DIAGNOSIS:  L2-3 degenerative disk disease with degenerative  scoliosis and stenosis.   POSTOPERATIVE DIAGNOSIS:  L2-3 degenerative disk disease with degenerative  scoliosis and stenosis.   PROCEDURE NAME:  1.  L2-3 decompressive laminectomy and foraminotomies, more than would be      required for simple interbody fusion alone.  2.  L2-3 posterior lumbar interbody fusion utilizing Tangent interbody      allograft wedge, Telamon interbody PEEK cage, and local autografting.  3.  L2-3 posterolateral arthrodesis utilizing nonsegment pedicle screw      fixation and local autografting.   SURGEON:  Julio Sicks, M.D.   ASSESSMENT:  [name] Barker, M.D.   ANESTHESIA:  General endotracheal.   INDICATIONS:  The patient is an 77 year old female with history of severe  back pain, failing all conservative management.  Workup demonstrates  evidence with marked degenerative disk disease with lateral listhesis at L2  and L3 with resultant unstable scoliosis at this level.  The patient has  been counseled as to her options.  She desired to proceed with an L2-3  decompression and fusion with instrumentation with hopes of improving her  symptoms.   OPERATIVE NOTE:  The patient taken to the operating room, placed on table in  supine position after adequate anesthesia was achieved.  The patient was  moved prone onto Wilson frame and all bony prominences padded.  Prepped and  draped sterilely.  A skin incision was made overlying the L2-3 level.  This  was carried down sharply in the midline.  Subperiosteal dissection  was  performed, exposing the lamina and facet joints, the L1, L2, and L3, as well  as the transverse processes at L2 and 3.  Deep self-retaining retractor was  placed.  Intraoperative fluoroscopy was used, and the levels were confirmed.  Complete decompressive laminectomy was then performed at L2 and L3 using  Leksell rongeurs, Kerrison rongeurs, and high-speed drill to remove the  entire lamina of L2, the entire inferior facet of L2, and superior facet of  L3.  The superior aspect of the lamina of L3 was also resected.  All bone  was cleaned to use for later autografting.  Ligamentum flavum was then  elevated and resected in piecemeal fashion using Kerrison rongeur.  The  underlying thecal sac and exiting L2 and L3 nerve roots were identified.  Y-  decompressive foraminotomies were then performed along the course of the  exiting nerve roots bilaterally, then coagulated and cut.  Starting first on  the patient's left side at L2-3, thecal sac and nerve roots were mobilized  and carried towards the midline.  Disk space was then incised with a 15  blade in rectangular fashion.  The disk space was then distracted to 7 mm.  The diskectomy was then repeated on the patient's right side.  Disk space  was distracted to 8 mm on the patient's right side, and the distractor was  removed from the patient's left side.  The disk space was then reamed and  then cut with 8-mm Tangent instruments on the left side.  Soft tissue was  removed at the interspace.  An 8 x 26-mm Telamon cage packed with interbody  autograft was then impacted into place for approximately 1 mm from the  posterior girdle margin of L2.  Distractor was removed from the patient's  right side.  Thecal sac and nerve roots were protected on the right side.  The disk space once again reamed and then cut with 8-mm Tangent instruments.  Soft tissue was removed from the interspace.  The disk space was further  curetted.  Morcellized autograft  was packed in the interspace.  An 8 x 26-mm  Tangent wedge was then impacted into place in the interspace approximately 2  mm posterior to the cortical margin.  Pedicles of L2 and L3 were then  identified using superficial landmarks with intraoperative fluoroscopy.  Superficial bone overlying the pedicle was then removed using high-speed  drill.  Each pedicle was then probed using pedicle awl.  Each pedicle awl  tract was probed and found to be solid bone.  Each pedicle awl tract was  then tapped with 5.25-mm screw tap.  Each screw hole was probed and found to  be solid through the bone.  A 6.75 x 40-mm spiral 90D screws were placed  bilaterally at L2 and L3.  Transverse processes at L2 and L3 were then  decorticated using high-speed drill.  Morcellized autograft was packed  posterolaterally for later fusion.  Short segment of titanium rod was then  placed for the screw heads at L2 and L3.  A locking cap was then placed over  the screw heads.  Locking caps were then engaged with the construct under  compression.  Final images revealed good position of the bone grafts at the  L4-5 level normal in supine.  The wound was then inspected.  There was no  evidence of any compression found in thecal sac or nerve roots.  Gelfoam was  placed topically and hemostasis found to be good.  A medium Hemovac drain  left in the epidural space.  The wounds were closed in layers with Vicryl  sutures.  Steri-Strips and sterile dressing were applied.  There were no  apparent complications.  The patient tolerated well, and she returns to  recovery room postoperatively.           ______________________________  [name] Maser Barker, M.D.     HAP/MEDQ  D:  07/10/2006  T:  07/11/2006  Job:  [id number]

## 2011-05-20 NOTE — Op Note (Signed)
NAME:  [name] Barker, [name] Barker                     ACCOUNT NO.:  [id number]   MEDICAL RECORD NO.:  [id number]                   PATIENT TYPE:  OBV   LOCATION:  NA                                   FACILITY:  [facility]   PHYSICIAN:  Michelle L. Vincente Poli, M.D.            DATE OF BIRTH:  1921-07-16   DATE OF PROCEDURE:  03/14/2004  DATE OF DISCHARGE:                                 OPERATIVE REPORT   PREOPERATIVE DIAGNOSIS:  _____________   PROCEDURE:  Enterocele repair.   ____________   PROCEDURE:  The patient was taken to the operating room, ___________.  Of  note, the patient had her left leg positioned ___________ very careful  ____________ leg with care during the procedure.  The lower abdomen, vagina,  and vulva were prepped and draped in the usual sterile fashion.  A Foley  catheter was inserted in the bladder.  Exam under anesthesia revealed a well-  suspended ___________, no evidence of rectocele.  The patient had a very  small enterocele that was approximately 2 x 2 cm in diameter.  Actually,  with her lying supine it went down almost to the lower third of the vagina.  We placed a cobra retractor in the vagina and then the vagina over the  enterocele was grasped on either side using Allis clamps.  We made a  transverse incision with a scalpel and then using Metzenbaum scissors  dissected the overlying vaginal epithelium free from the enterocele sac.  There was no bowel trapped in the enterocele sac ____________, so we then  reduced the enterocele sac nicely in the space and then used a series of  pursestring sutures using 2-0 Vicryl suture, used three, and closed the  enterocele sac with good effect.  At this point I then placed a small square  of mesh at the base of the enterocele sac and then fixed it to the fascia  using four stitches of #1 Prolene suture.  The excess vaginal epithelium was  excised and the vagina then closed with a continuous stitch of ___________.  No vaginal  bleeding was noted.  All sponge, lap, and instrument counts were  correct x2.  A vaginal pack soaked in ____________ in the vagina.  The  patient tolerated the procedure well and was transferred to the recovery  room in stable condition.                                               Michelle L. Vincente Poli, M.D.    Florestine Avers  D:  03/14/2004  T:  03/16/2004  Job:  [id number]

## 2011-05-20 NOTE — Op Note (Signed)
[facility]  Patient:    [name] Barker, [name] Barker                  MRN: [id number] Proc. Date: 06/28/01 Adm. Date:  [id number] Attending:  Marcelle Overlie CC:         Valetta Mole. Swords, M.D. [facility]  Marcelle Overlie, M.D.   Operative Report  PREOPERATIVE DIAGNOSIS:  Pelvic prolapse.  POSTOPERATIVE DIAGNOSIS:  Pelvic prolapse.  PROCEDURES: 1. Anterior vaginal vault repair, pubovaginal sling, and excision of multiple    urethral polyps (Dr. Patsi Sears). 2. Laparoscopically assisted hysterectomy with rectocele repair (Dr. Vincente Poli).  DESCRIPTION OF PROCEDURE:  Preparation:  After appropriate preanesthesia, the patient was brought to the operating room and placed on the operating table in the dorsal supine position, where the patient then underwent laparoscopic-assisted hysterectomy and oophorectomy bilaterally.  With the patient in the lithotomy position, the vaginal cuff was identified and 10 cc of Marcaine 0.5% with epinephrine 1:200,000 was injected into the midline anterior vaginal wall.  Dissection was then accomplished sharply in retrograde fashion from the area of cervical removal, distalward, to the urethra.  In addition, urethral polyps were identified, and they were excised and the urethra closed with interrupted 4-0 suture.  Anterior vaginal wall was bluntly and sharply dissected, and anterior vaginal vault was repaired using horizontal mattress 3-0 Vicryl suture.  Following this, a horizontal mattress #1 PDS suture was used through the cardinal ligaments to try to prevent future enterocele.  The posterior retropubic space was then dissected and using the vaginal anchor system, #1 Prolene sutures were placed.  A 6 x 8 cm portion of tutoplast fascia was then used to create a T-shaped sling and the edges folded and sutured with 3-0 Vicryl suture.  Each end was then placed in a retrograde fashion and sutured in place, with a right angle behind the  bladder neck to ensure that the sling would not be tied too tight.  The base of sling was then sutured to the base of the anterior vaginal vault repair.  The dissected vaginal epithelium was then excised, and the vaginal wall was closed with running 3-0 Vicryl suture.  The patient then underwent cystoscopy, showing no suture was in the bladder. Following this, she underwent rectocele repair per Dr. Vincente Poli.  This will be dictated separately by Dr. Vincente Poli. DD:  06/28/01 TD:  06/28/01 Job: [id number]

## 2011-05-20 NOTE — Assessment & Plan Note (Signed)
[facility]                           GASTROENTEROLOGY OFFICE NOTE   NAME:Ferrone, SADIRA STANDARD                  MRN:          [id number]  DATE:09/10/2006                            DOB:          [birth year]    REASON FOR VISIT:  Return office visit for chronic constipation and personal  history of colon cancer.   HISTORY OF PRESENT ILLNESS:  Mrs. [name] Barker is about 6 weeks status post a  lumbar fusion, and she still has significant back pain and mobility  problems.  She is status post laparotomy and small bowel resection for a  closed loop, small bowel obstruction with infarction of a segment of small  bowel in February 2006.  Her last colonoscopy was not complete due to her  anastomosis, and she underwent a barium enema which revealed marked  diverticulosis of the sigmoid colon and an unremarkable right hemicolectomy  with retained feces, and a portion of the proximal transverse colon and  polypoid lesions could not be fully excluded.  Colonoscopy was complete to  the transverse colon and the proximal transverse colon was probably not seen  on colonoscopy.  She did have extensive diverticulosis from the transverse  colon to the sigmoid colon and no other abnormalities were noted.  Her  constipation has clearly been worse since she has been less active and  taking Percocet frequently to control back pain.  She notes no rectal  bleeding.  She has had chronic right lower quadrant pain that is relieved  with bowel movements.  These symptoms have not changed.   PAST MEDICAL HISTORY:  1. Status post right hemicolectomy 1994.  2. Diverticulosis.  3. Chronic constipation.  4. Status post hysterectomy and rectocele repair.  5. Status post left total knee replacement.  6. Status post appendectomy.  7. Status post lumbar fusion 6 weeks ago.  8. Status post laparotomy with segmental small bowel resection and lysis      of adhesions for a closed LOOP  obstruction with infarction in February      2006.  9. Arthritis.  10.Hyperlipidemia.  11.Status post right shoulder rotator cuff repair.  12.Status post hysterectomy.   MEDICATIONS:  Listed on the chart and have been reviewed.   ALLERGIES:  MORPHINE, PENICILLIN AND STATIN DRUGS.   SOCIAL HISTORY/REVIEW OF SYSTEMS:  Per the handwritten evaluation form.   PHYSICAL EXAMINATION:  GENERAL:  No acute distress.  VITAL SIGNS:  Height 4 feet 11 inches, weight 129.2 pounds, blood pressure  126/84, pulse 66 and regular.  HEENT:  Anicteric sclerae.  Oropharynx clear.  CHEST:  Clear to auscultation bilaterally.  CARDIAC:  Regular rate and rhythm without murmurs appreciated.  ABDOMEN:  Soft, nontender, nondistended.  Normoactive bowel sounds.  No  palpable organomegaly, masses or hernias.  RECTAL:  Deferred.  NEUROLOGICAL:  Alert and oriented x3.  Grossly nonfocal.   ASSESSMENT/PLAN:  1. Chronic constipation, worsened with recent surgery and increasing need      for Percocet.  Maintain high fiber diet with adequate fluid intake.      May use Milk-of-Magnesia daily to b.i.d. as needed.  Minimize narcotic  pain medications when feasible.  2. Personal history of colon cancer.  Given her prior history of      incomplete colonoscopy, I feel proceeding with a virtual colonoscopy or      air contrasted barium enema in 2-3 months would be the best form of      surveillance at this point in time.  I also gave her the option of      discontinuing surveillance procedures, given her age and the length of      time since her colon cancer diagnosis.  She will contact the office in      2-3 months to let us know how she would like to proceed.                                   Venita Lick. Russella Dar, MD, [facility]   MTS/MedQ  DD:  09/14/2006  DT:  09/15/2006  Job #:  [id number]

## 2011-05-20 NOTE — H&P (Signed)
[name] Barker. [facility]  Patient:    [name] Barker, [name] Barker                    MRN: Adm. Date:  01/14/00 Attending:  Vania Rea. Supple, M.D. Dictator:   Ralene Bathe, P.A. CC:         Valetta Mole. Swords, M.D. [facility]                         History and Physical  DATE OF BIRTH:  09-Aug-1921.  CHIEF COMPLAINT:  Left knee pain.  HISTORY OF PRESENT ILLNESS:  This is a 77 year old female who has had chronic and progressive difficulties involving the left knee now.  She was initially referred to our practice by Dr. Valetta Mole. Swords for evaluation of her left knee arthritis. Initially, she was seen and treated conservatively, however, continued to have problems.  We attempted left knee arthroscopy and debridement of her degenerative meniscal tears.  She had mild relief for a short time; however, began having progressive difficulties postoperatively again.  She does not tolerate NSAIDs very well and has had multiple steroid injections.  Physical exam showed a valgus deformity of the left knee, mild tenderness along both joint lines with 1+ valgus laxity.  AP of the knees show significant lateral joint space narrowing. Arthroscopy at the time did show significant destruction of the articular surfaces of the knee; with these findings, it is felt total knee arthroplasty is indicated. Risks and benefits to surgical procedure explained at length with patient and her husband and they are in agreement with proceeding.  Attempts were made for autologous donation preoperatively, however, these were unsuccessful.  PAST MEDICAL HISTORY: 1. History of colon cancer. 2. Osteoarthritis. 3. Gastroesophageal reflux. 4. Hypercholesterolemia. 5. Mild asthma. 6. Vertigo.  PAST SURGICAL HISTORY: 1. Colectomy in 1994. 2. Appendectomy. 3. Left knee scoped.  MEDICATIONS: 1. Centrum daily. 2. Vitamin C, vitamin E and vitamin D daily. 3. Prevacid 30 mg daily. 4. Over-the-counter  Tylenol b.i.d. 5. Claritin 10 mg daily. 6. Meclizine 25 mg b.i.d. 7. Lipitor 10 mg q.o.d. 8. Osteo-Bi-Flex t.i.d. 9. Tums 400 mg daily.  ALLERGIES:  PENICILLIN causes itching, MORPHINE caused blisters and a questionable allergy to TYLOX with possible nausea with her knee scope; they are unsure specifically of this allergy.  FAMILY HISTORY:  Negative for diabetes or heart disease.  SOCIAL HISTORY:  Patients daughter is actually Anselm Lis, N.P. and her son-in-law is Gene Serpe, P.A., both which are P.A.s here with cardiology groups in town.  They live in an apartment that is attached to their house, which is one story, and the bathroom is very accessible.  She does not smoke nor drink.  MEDICAL PHYSICIAN:  Dr. Valetta Mole. Swords.  ONCOLOGIST:  Dr. Ladona Horns. Neijstrom.  REVIEW OF SYSTEMS:  Patient denies any recent fever, chills, night sweats.  No bleeding tendencies.  CNS:  No blurred or double vision, seizures or paralysis.  RESPIRATORY:  No shortness of breath, productive cough or hemoptysis. CARDIOVASCULAR:  No chest pain, angina or orthopnea.  GI:  No nausea, vomiting r constipation.  Non-bloody stools.  GENITOURINARY:  No dysuria, hematuria or discharge.  MUSCULOSKELETAL:  As pertinent to present illness.  PHYSICAL EXAMINATION:  GENERAL:  A well-developed, well-nourished 77 year old female who ambulates with an antalgic gait and uses a cane for ambulation.  VITAL SIGNS:  Pulse 72.  Respirations 10.  Blood pressure is 118/80.  HEENT:  Normocephalic.  Extraocular motions intact.  NECK:  Supple.  No lymphadenopathy.  No carotid bruit appreciated on exam.  No thyromegaly.  CHEST:  Clear to auscultation bilaterally.  No rales or rhonchi.  HEART:  Regular rate and rhythm.  No murmurs, gallops, rubs, heaves or thrills.   ABDOMEN:  Positive bowel sounds.  Soft and nontender.  No hepatosplenomegaly appreciated.  EXTREMITIES:  Positive distal pulses bilaterally.  Her  left knee exam is as in present illness.  IMPRESSION: 1. End-stage osteoarthritis, left knee. 2. History of colon cancer. 3. Gastroesophageal reflux. 4. Hypercholesterolemia. 5. Mild asthma. 6. Vertigo.  PLAN:  Patient will be admitted for total knee arthroplasty and will proceed. er medical physician is Dr. Birdie Sons, should medical assistance be necessary during hospitalization.  At this time, we will plan on disposition to be home, s she does have assistance; however, if she should do poorly with postoperative physical therapy, a rehab consult may be indicated.  She was not able to donate  autologous blood. DD:  01/08/00 TD:  01/08/00 Job: [id number]

## 2011-06-06 ENCOUNTER — Other Ambulatory Visit: Payer: Self-pay | Admitting: Internal Medicine

## 2011-06-06 DIAGNOSIS — Z1231 Encounter for screening mammogram for malignant neoplasm of breast: Secondary | ICD-10-CM

## 2011-07-08 ENCOUNTER — Encounter: Payer: Self-pay | Admitting: Internal Medicine

## 2011-07-09 ENCOUNTER — Ambulatory Visit: Payer: Medicare Other | Admitting: Internal Medicine

## 2011-07-16 ENCOUNTER — Ambulatory Visit
Admission: RE | Admit: 2011-07-16 | Discharge: 2011-07-16 | Disposition: A | Discharge status: Home / Self Care | Payer: Medicare Other | Source: Ambulatory Visit | Attending: Internal Medicine | Admitting: Internal Medicine

## 2011-07-16 DIAGNOSIS — Z1231 Encounter for screening mammogram for malignant neoplasm of breast: Secondary | ICD-10-CM

## 2011-07-18 ENCOUNTER — Ambulatory Visit (INDEPENDENT_AMBULATORY_CARE_PROVIDER_SITE_OTHER): Payer: Medicare Other | Admitting: Internal Medicine

## 2011-07-18 ENCOUNTER — Encounter: Payer: Self-pay | Admitting: Internal Medicine

## 2011-07-18 DIAGNOSIS — Z23 Encounter for immunization: Secondary | ICD-10-CM

## 2011-07-18 DIAGNOSIS — Z0000 Encounter for general adult medical examination without abnormal findings: Secondary | ICD-10-CM

## 2011-07-18 DIAGNOSIS — D649 Anemia, unspecified: Secondary | ICD-10-CM

## 2011-07-18 DIAGNOSIS — M545 Low back pain, unspecified: Secondary | ICD-10-CM

## 2011-07-18 DIAGNOSIS — E559 Vitamin D deficiency, unspecified: Secondary | ICD-10-CM

## 2011-07-18 NOTE — Patient Instructions (Signed)
Followup with Dr. Cato Mulligan in 2 months Continue present medications   Call or return to clinic prn if these symptoms worsen or fail to improve as anticipated.

## 2011-07-18 NOTE — Progress Notes (Signed)
  Subjective:    Patient ID: [name] Barker, female    DOB: 1921-03-08, 77 y.o.   MRN: [id number]  HPI  77 year old patient who is seen today for followup. She complains of persistent low back pain. This has been a chronic problem and she has had a fairly recent epidural without much benefit. Her chief complaint is fatigue this has been present for a number of months; comprehensive laboratory screen in the spring revealed mild anemia normal TSH and sedimentation rate. No focal symptoms;  she is also requested a pertussis vaccine and followup vitamin D level    Review of Systems  Constitutional: Positive for fatigue.  HENT: Negative for hearing loss, congestion, sore throat, rhinorrhea, dental problem, sinus pressure and tinnitus.   Eyes: Negative for pain, discharge and visual disturbance.  Respiratory: Negative for cough and shortness of breath.   Cardiovascular: Negative for chest pain, palpitations and leg swelling.  Gastrointestinal: Negative for nausea, vomiting, abdominal pain, diarrhea, constipation, blood in stool and abdominal distention.  Genitourinary: Negative for dysuria, urgency, frequency, hematuria, flank pain, vaginal bleeding, vaginal discharge, difficulty urinating, vaginal pain and pelvic pain.  Musculoskeletal: Positive for back pain. Negative for joint swelling, arthralgias and gait problem.  Skin: Negative for rash.  Neurological: Positive for weakness. Negative for dizziness, syncope, speech difficulty, numbness and headaches.  Hematological: Negative for adenopathy.  Psychiatric/Behavioral: Negative for behavioral problems, dysphoric mood and agitation. The patient is not nervous/anxious.        Objective:   Physical Exam  Constitutional: She is oriented to person, place, and time. She appears well-developed and well-nourished.       No distress Very pleasant upbeat mood  HENT:  Head: Normocephalic.  Right Ear: External ear normal.  Left Ear: External ear  normal.  Mouth/Throat: Oropharynx is clear and moist.  Eyes: Conjunctivae and EOM are normal. Pupils are equal, round, and reactive to light.  Neck: Normal range of motion. Neck supple. No thyromegaly present.  Cardiovascular: Normal rate, regular rhythm, normal heart sounds and intact distal pulses.   Pulmonary/Chest: Effort normal and breath sounds normal.  Abdominal: Soft. Bowel sounds are normal. She exhibits no mass. There is no tenderness.  Musculoskeletal: Normal range of motion.  Lymphadenopathy:    She has no cervical adenopathy.  Neurological: She is alert and oriented to person, place, and time.  Skin: Skin is warm and dry. No rash noted.  Psychiatric: She has a normal mood and affect. Her behavior is normal.          Assessment & Plan:  Fatigue. History of anemia. Will check CBC History of vitamin D deficiency. We'll check a followup vitamin D level Chronic low back pain

## 2011-07-19 LAB — CBC WITH DIFFERENTIAL/PLATELET
Basophils Absolute: 0 10*3/uL (ref 0.0–0.1)
Basophils Relative: 1 % (ref 0–1)
Eosinophils Absolute: 0.4 10*3/uL (ref 0.0–0.7)
Eosinophils Relative: 6 % — ABNORMAL HIGH (ref 0–5)
HCT: 38 % (ref 36.0–46.0)
Hemoglobin: 12.2 g/dL (ref 12.0–15.0)
Lymphocytes Relative: 40 % (ref 12–46)
Lymphs Abs: 2.3 10*3/uL (ref 0.7–4.0)
MCH: 30.1 pg (ref 26.0–34.0)
MCHC: 32.1 g/dL (ref 30.0–36.0)
MCV: 93.8 fL (ref 78.0–100.0)
Monocytes Absolute: 0.7 10*3/uL (ref 0.1–1.0)
Monocytes Relative: 12 % (ref 3–12)
Neutro Abs: 2.4 10*3/uL (ref 1.7–7.7)
Neutrophils Relative %: 42 % — ABNORMAL LOW (ref 43–77)
Platelets: 224 10*3/uL (ref 150–400)
RBC: 4.05 MIL/uL (ref 3.87–5.11)
RDW: 14 % (ref 11.5–15.5)
WBC: 5.8 10*3/uL (ref 4.0–10.5)

## 2011-07-19 LAB — VITAMIN D 25 HYDROXY (VIT D DEFICIENCY, FRACTURES): Vit D, 25-Hydroxy: 44 ng/mL (ref 30–89)

## 2011-07-28 ENCOUNTER — Other Ambulatory Visit (HOSPITAL_COMMUNITY): Payer: Self-pay | Admitting: Obstetrics and Gynecology

## 2011-07-28 DIAGNOSIS — K639 Disease of intestine, unspecified: Secondary | ICD-10-CM

## 2011-07-28 DIAGNOSIS — N898 Other specified noninflammatory disorders of vagina: Secondary | ICD-10-CM

## 2011-07-29 ENCOUNTER — Ambulatory Visit: Payer: Medicare Other | Admitting: Internal Medicine

## 2011-07-30 ENCOUNTER — Ambulatory Visit (HOSPITAL_COMMUNITY)
Admission: RE | Admit: 2011-07-30 | Discharge: 2011-07-30 | Disposition: A | Discharge status: Home / Self Care | Payer: Medicare Other | Source: Ambulatory Visit | Attending: Obstetrics and Gynecology | Admitting: Obstetrics and Gynecology

## 2011-07-30 ENCOUNTER — Other Ambulatory Visit (HOSPITAL_COMMUNITY): Payer: Self-pay | Admitting: Obstetrics and Gynecology

## 2011-07-30 DIAGNOSIS — K5732 Diverticulitis of large intestine without perforation or abscess without bleeding: Secondary | ICD-10-CM | POA: Insufficient documentation

## 2011-07-30 DIAGNOSIS — N938 Other specified abnormal uterine and vaginal bleeding: Secondary | ICD-10-CM | POA: Insufficient documentation

## 2011-07-30 DIAGNOSIS — N898 Other specified noninflammatory disorders of vagina: Secondary | ICD-10-CM

## 2011-07-30 DIAGNOSIS — L918 Other hypertrophic disorders of the skin: Secondary | ICD-10-CM | POA: Insufficient documentation

## 2011-07-30 DIAGNOSIS — K639 Disease of intestine, unspecified: Secondary | ICD-10-CM

## 2011-07-30 DIAGNOSIS — N949 Unspecified condition associated with female genital organs and menstrual cycle: Secondary | ICD-10-CM | POA: Insufficient documentation

## 2011-07-30 MED ORDER — IOHEXOL 300 MG/ML  SOLN
100.0000 mL | Freq: Once | INTRAMUSCULAR | Status: AC | PRN
  Administered 2011-07-30: 100 mL via INTRAVENOUS

## 2011-08-18 ENCOUNTER — Other Ambulatory Visit: Payer: Self-pay | Admitting: *Deleted

## 2011-08-18 MED ORDER — HYDROCODONE-ACETAMINOPHEN 7.5-500 MG/15ML PO SOLN: 15.0000 mL | Freq: Four times a day (QID) | ORAL | Status: AC | PRN

## 2011-08-25 ENCOUNTER — Ambulatory Visit (INDEPENDENT_AMBULATORY_CARE_PROVIDER_SITE_OTHER)
Admission: RE | Admit: 2011-08-25 | Discharge: 2011-08-25 | Disposition: A | Discharge status: Home / Self Care | Payer: Medicare Other | Source: Ambulatory Visit | Attending: Internal Medicine | Admitting: Internal Medicine

## 2011-08-25 ENCOUNTER — Encounter: Payer: Self-pay | Admitting: Internal Medicine

## 2011-08-25 ENCOUNTER — Ambulatory Visit (INDEPENDENT_AMBULATORY_CARE_PROVIDER_SITE_OTHER): Payer: Medicare Other | Admitting: Internal Medicine

## 2011-08-25 VITALS — BP 122/78 | HR 76 | Temp 97.8°F | Wt 137.0 lb

## 2011-08-25 DIAGNOSIS — C189 Malignant neoplasm of colon, unspecified: Secondary | ICD-10-CM

## 2011-08-25 DIAGNOSIS — M545 Low back pain, unspecified: Secondary | ICD-10-CM

## 2011-08-25 DIAGNOSIS — Z23 Encounter for immunization: Secondary | ICD-10-CM

## 2011-08-25 DIAGNOSIS — R059 Cough, unspecified: Secondary | ICD-10-CM

## 2011-08-25 DIAGNOSIS — R05 Cough: Secondary | ICD-10-CM

## 2011-08-25 DIAGNOSIS — I2510 Atherosclerotic heart disease of native coronary artery without angina pectoris: Secondary | ICD-10-CM

## 2011-08-25 DIAGNOSIS — E785 Hyperlipidemia, unspecified: Secondary | ICD-10-CM

## 2011-08-25 NOTE — Progress Notes (Signed)
  Subjective:    Patient ID: [name] Barker, female    DOB: [birth year], 77 y.o.   MRN: [id number]  HPI  Patient Active Problem List  Diagnoses  . ADENOCARCINOMA, COLON---no known recurrence  . HYPERLIPIDEMIA---no treatment  . ANEMIA-NOS--still taking iron Lab Results  Component Value Date   WBC 5.8 07/18/2011   HGB 12.2 07/18/2011   HCT 38.0 07/18/2011   MCV 93.8 07/18/2011   PLT 224 07/18/2011     . CAD---no symptoms  . GERD---tolerating PPI  .   Marland Kitchen LOW BACK PAIN, CHRONIC---thi is her main problem. She uses low dose Vicodin as needed.  . OSTEOPENIA---no need for follow up. She has not had any falls.   Past Medical History  Diagnosis Date  . ADENOCARCINOMA, COLON 08/19/2007  . ANEMIA-NOS 11/17/2006  . CAD 06/21/2010  . DIVERTICULOSIS-COLON 05/30/2010  . GERD 11/17/2006  . HYPERLIPIDEMIA 11/17/2006  . LOW BACK PAIN, CHRONIC 02/04/2010  . OSTEOPENIA 11/17/2006   No past surgical history on file.  reports that she has never smoked. She has never used smokeless tobacco. She reports that she does not drink alcohol or use illicit drugs. family history is not on file. Allergies  Allergen Reactions  . Methenamine Mandelate     REACTION: burning sensation  . Morphine Sulfate     REACTION: blister  . Nsaids     REACTION: oral nsaids do not work for her back pain  . Penicillins     REACTION: urticaria (hives)  . Promethazine Hcl     REACTION: hallucinations  . Statins     REACTION: unspecified     Review of Systems  patient denies chest pain, shortness of breath, orthopnea. Denies lower extremity edema, abdominal pain, change in appetite, change in bowel movements. Patient denies rashes, musculoskeletal complaints. No other specific complaints in a complete review of systems.      Objective:   Physical Exam  Well-developed well-nourished female in no acute distress. HEENT exam atraumatic, normocephalic, extraocular muscles are intact. Left ear with cerumen impaction. Neck  is supple. No jugular venous distention no thyromegaly. Chest clear to auscultation without increased work of breathing. Cardiac exam S1 and S2 are regular. Abdominal exam active bowel sounds, soft, nontender. Extremities no edema. Neurologic exam she is alert without any motor sensory deficits. Gait is broad based.    Assessment & Plan:

## 2011-08-25 NOTE — Patient Instructions (Signed)
Ok to stop iron

## 2011-08-29 NOTE — Assessment & Plan Note (Signed)
No known recurrence 

## 2011-08-29 NOTE — Assessment & Plan Note (Signed)
Requires minimal medication. No further evaluation.

## 2011-08-29 NOTE — Assessment & Plan Note (Signed)
No symptoms. Continue current medications. 

## 2011-08-29 NOTE — Assessment & Plan Note (Signed)
No need for treatment

## 2011-09-19 LAB — CBC
HCT: 30.5 — ABNORMAL LOW
HCT: 37.9
Hemoglobin: 10.3 — ABNORMAL LOW
Hemoglobin: 12.8
MCHC: 33.6
MCHC: 33.7
MCV: 95.3
MCV: 95.5
Platelets: 181
Platelets: 241
RBC: 3.2 — ABNORMAL LOW
RBC: 3.97
RDW: 12.8
RDW: 13.2
WBC: 4.6
WBC: 7.4

## 2011-09-19 LAB — BASIC METABOLIC PANEL
BUN: 21
BUN: 23
CO2: 27
CO2: 34 — ABNORMAL HIGH
Calcium: 7.4 — ABNORMAL LOW
Calcium: 9
Chloride: 101
Chloride: 110
Creatinine, Ser: 0.68
Creatinine, Ser: 0.75
GFR calc Af Amer: >60
GFR calc Af Amer: >60
GFR calc non Af Amer: >60
GFR calc non Af Amer: >60
Glucose, Bld: 115 — ABNORMAL HIGH
Glucose, Bld: 85
Potassium: 3.3 — ABNORMAL LOW
Potassium: 3.9
Sodium: 143
Sodium: 143

## 2011-09-19 LAB — DIFFERENTIAL
Basophils Absolute: 0
Basophils Relative: 0
Eosinophils Absolute: 0
Eosinophils Relative: 1
Lymphocytes Relative: 20
Lymphs Abs: 1.5
Monocytes Absolute: 0.9
Monocytes Relative: 13 — ABNORMAL HIGH
Neutro Abs: 4.9
Neutrophils Relative %: 67

## 2011-09-19 LAB — HEPATIC FUNCTION PANEL
ALT: 15
AST: 25
Albumin: 3.3 — ABNORMAL LOW
Alkaline Phosphatase: 49
Bilirubin, Direct: <0.1
Total Bilirubin: 0.8
Total Protein: 6.2

## 2011-09-19 LAB — CLOSTRIDIUM DIFFICILE EIA: C difficile Toxins A+B, EIA: NEGATIVE

## 2011-09-19 LAB — HEPARIN LEVEL (UNFRACTIONATED): Heparin Unfractionated: <0.1 — ABNORMAL LOW

## 2011-09-19 LAB — AMYLASE: Amylase: 62

## 2011-11-25 ENCOUNTER — Other Ambulatory Visit: Payer: Self-pay | Admitting: *Deleted

## 2011-11-25 MED ORDER — ALBUTEROL SULFATE HFA 108 (90 BASE) MCG/ACT IN AERS: 2.0000 | INHALATION_SPRAY | Freq: Two times a day (BID) | RESPIRATORY_TRACT | Status: DC | PRN

## 2011-12-16 ENCOUNTER — Telehealth: Payer: Self-pay | Admitting: Oncology

## 2011-12-16 ENCOUNTER — Ambulatory Visit: Payer: Medicare Other | Admitting: Oncology

## 2011-12-16 ENCOUNTER — Other Ambulatory Visit: Payer: Medicare Other | Admitting: Lab

## 2011-12-16 NOTE — Telephone Encounter (Signed)
Pt came into the office regarding her appt for dec. gve the pt her new revised feb appts. lmonvm with the pt's dtr on sat  With the new pt's appt

## 2012-01-07 DIAGNOSIS — L918 Other hypertrophic disorders of the skin: Secondary | ICD-10-CM | POA: Diagnosis not present

## 2012-02-04 DIAGNOSIS — R350 Frequency of micturition: Secondary | ICD-10-CM | POA: Diagnosis not present

## 2012-02-04 DIAGNOSIS — N390 Urinary tract infection, site not specified: Secondary | ICD-10-CM | POA: Diagnosis not present

## 2012-02-04 DIAGNOSIS — R3915 Urgency of urination: Secondary | ICD-10-CM | POA: Diagnosis not present

## 2012-02-10 ENCOUNTER — Telehealth: Payer: Self-pay | Admitting: Oncology

## 2012-02-10 ENCOUNTER — Other Ambulatory Visit: Payer: Medicare Other | Admitting: Lab

## 2012-02-10 ENCOUNTER — Ambulatory Visit (HOSPITAL_BASED_OUTPATIENT_CLINIC_OR_DEPARTMENT_OTHER): Payer: Medicare Other | Admitting: Oncology

## 2012-02-10 VITALS — BP 117/67 | HR 57 | Temp 96.9°F | Ht 59.0 in | Wt 136.9 lb

## 2012-02-10 DIAGNOSIS — D649 Anemia, unspecified: Secondary | ICD-10-CM | POA: Diagnosis not present

## 2012-02-10 DIAGNOSIS — C189 Malignant neoplasm of colon, unspecified: Secondary | ICD-10-CM

## 2012-02-10 DIAGNOSIS — D509 Iron deficiency anemia, unspecified: Secondary | ICD-10-CM | POA: Diagnosis not present

## 2012-02-10 LAB — CBC WITH DIFFERENTIAL/PLATELET
BASO%: 0.6 % (ref 0.0–2.0)
Basophils Absolute: 0 10*3/uL (ref 0.0–0.1)
EOS%: 1.7 % (ref 0.0–7.0)
Eosinophils Absolute: 0.1 10*3/uL (ref 0.0–0.5)
HCT: 34.8 % (ref 34.8–46.6)
HGB: 11.6 g/dL (ref 11.6–15.9)
LYMPH%: 34.8 % (ref 14.0–49.7)
MCH: 30.5 pg (ref 25.1–34.0)
MCHC: 33.2 g/dL (ref 31.5–36.0)
MCV: 91.9 fL (ref 79.5–101.0)
MONO#: 0.6 10*3/uL (ref 0.1–0.9)
MONO%: 10.4 % (ref 0.0–14.0)
NEUT#: 3.1 10*3/uL (ref 1.5–6.5)
NEUT%: 52.5 % (ref 38.4–76.8)
Platelets: 228 10*3/uL (ref 145–400)
RBC: 3.79 10*6/uL (ref 3.70–5.45)
RDW: 12.9 % (ref 11.2–14.5)
WBC: 5.9 10*3/uL (ref 3.9–10.3)
lymph#: 2 10*3/uL (ref 0.9–3.3)

## 2012-02-10 LAB — CEA: CEA: 1.3 ng/mL (ref 0.0–5.0)

## 2012-02-10 NOTE — Telephone Encounter (Signed)
[name] Barker t sch is not open for 01/2013.  Made copy of pof and will call the pt.  Pt aware  aom

## 2012-02-10 NOTE — Progress Notes (Signed)
OFFICE PROGRESS NOTE   INTERVAL HISTORY:   She returns as scheduled. She is being treated for a urinary tract infection. She reports undergoing an evaluation for vaginal bleeding by gynecology. This has improved and she reports a CT scan was nondiagnostic.  Objective:  Vital signs in last 24 hours:  Blood pressure 117/67, pulse 57, temperature 96.9 F (36.1 C), temperature source Oral, height 4\' 11"  (1.499 m), weight 136 lb 14.2 oz (62.093 kg).    HEENT: Neck without mass Lymphatics: No cervical, supraclavicular, axillary, or inguinal nodes. There is an inclusion cyst at the left inguinal region Resp:  Scattered and inspiratory rhonchi, no respiratory distress Cardio:  Regular rate and rhythm GI:  Nontender, no mass, no hepatosplenomegaly Vascular:  The left lower leg is slightly larger than the right side, no edema    Lab Results:  Lab Results  Component Value Date   WBC 5.9 02/10/2012   HGB 11.6 02/10/2012   HCT 34.8 02/10/2012   MCV 91.9 02/10/2012   PLT 228 02/10/2012  ANC 3.1     Medications: I have reviewed the patient's current medications.  Assessment/Plan: 1.  Stage III colon cancer diagnosed May of 2008.  Status post partial colectomy May 28, 2007 followed by adjuvant Xeloda chemotherapy completed in November of 2008.  She remains in clinical remission. 2.  History of iron deficiency anemia - the hemoglobin is now in the low-normal range.  Stool Hemoccult cards were negative on October 19, 2009.  She continues daily iron. 3.  History of hand-foot syndrome secondary to Xeloda - resolved. 4.  History of a T2 carcinoma of the colon diagnosed in 1994 and treated with adjuvant 5-FU/leucovorin and levamisole. 5.  Left upper back lipoma. 6.  History of an irregular heartbeat on multiple visits at the [facility] with an EKG February 28 showing sinus rhythm. 7.  Admission with partial small bowel obstruction, likely secondary to adhesions, in February 2009. 8.  Chronic low  back pain [facility] Vear [facility]. 9.  Subcutaneous nodular lesion at the left groin - question sebaceous cyst-stable 10. Report of intermittent vaginal bleeding-evaluated by gynecology  Disposition:  She remains in clinical remission from colon cancer. We obtained a CEA today. She would like to continue followup at the cancer Center. She will return for an office visit in one year.   [name] Shutters, MD  02/10/2012  1:03 PM

## 2012-02-13 ENCOUNTER — Telehealth: Payer: Self-pay | Admitting: *Deleted

## 2012-02-13 NOTE — Telephone Encounter (Signed)
Message copied by Caleb Popp on Fri Feb 13, 2012  9:00 AM ------      Message from: Thornton Papas B      Created: Wed Feb 11, 2012  8:33 PM       Please call patient, cea is normal, f/u as scheduled

## 2012-02-13 NOTE — Telephone Encounter (Signed)
Spoke with pt's husband. Labs are normal. He verbalized understanding.

## 2012-02-18 ENCOUNTER — Other Ambulatory Visit: Payer: Self-pay

## 2012-02-18 MED ORDER — HYDROCODONE-ACETAMINOPHEN 7.5-500 MG/15ML PO SOLN: ORAL | Status: DC

## 2012-02-18 NOTE — Telephone Encounter (Signed)
Confused re: tabs, solution, and directions, [name] Barker.

## 2012-02-18 NOTE — Telephone Encounter (Signed)
Rx called in to pharmacy. 

## 2012-02-18 NOTE — Telephone Encounter (Signed)
ok 

## 2012-02-18 NOTE — Telephone Encounter (Signed)
Rx request for hydrocodone-apap 7.5/500/60ml Take 1 tsp 3 times a day as needed for pain.  Pt last seen 08/25/11. Pls advise.

## 2012-02-25 ENCOUNTER — Ambulatory Visit (INDEPENDENT_AMBULATORY_CARE_PROVIDER_SITE_OTHER): Payer: Medicare Other | Admitting: Internal Medicine

## 2012-02-25 ENCOUNTER — Encounter: Payer: Self-pay | Admitting: Internal Medicine

## 2012-02-25 DIAGNOSIS — D649 Anemia, unspecified: Secondary | ICD-10-CM

## 2012-02-25 DIAGNOSIS — C189 Malignant neoplasm of colon, unspecified: Secondary | ICD-10-CM | POA: Diagnosis not present

## 2012-02-25 DIAGNOSIS — M545 Low back pain, unspecified: Secondary | ICD-10-CM | POA: Diagnosis not present

## 2012-02-25 NOTE — Progress Notes (Signed)
Patient ID: [name] Barker, female   DOB: [birth year], 78 y.o.   MRN: [id number] Chronic back pain---followed by dr. Vear Clock  Osteoporosis: has been on bisphosphonate for years  Colon CA---no known recurrence  Lipids:  Lab Results  Component Value Date   CHOL 238* 06/21/2010   HDL 57.50 06/21/2010   LDLDIRECT 169.4 06/21/2010   TRIG 85.0 06/21/2010   CHOLHDL 4 06/21/2010   No meds and no need for treatment  Past Medical History  Diagnosis Date  . ADENOCARCINOMA, COLON 08/19/2007  . ANEMIA-NOS 11/17/2006  . CAD 06/21/2010  . DIVERTICULOSIS-COLON 05/30/2010  . GERD 11/17/2006  . HYPERLIPIDEMIA 11/17/2006  . LOW BACK PAIN, CHRONIC 02/04/2010  . OSTEOPENIA 11/17/2006    History   Social History  . Marital Status: Married    Spouse Name: N/A    Number of Children: N/A  . Years of Education: N/A   Occupational History  . Not on file.   Social History Main Topics  . Smoking status: Never Smoker   . Smokeless tobacco: Never Used  . Alcohol Use: No  . Drug Use: No  . Sexually Active: Not on file   Other Topics Concern  . Not on file   Social History Narrative  . No narrative on file    No past surgical history on file.  No family history on file.  Allergies  Allergen Reactions  . Methenamine Mandelate     REACTION: burning sensation  . Morphine Sulfate     REACTION: blister  . Nsaids     REACTION: oral nsaids do not work for her back pain  . Penicillins     REACTION: urticaria (hives)  . Promethazine Hcl     REACTION: hallucinations  . Statins     REACTION: unspecified    Current Outpatient Prescriptions on File Prior to Visit  Medication Sig Dispense Refill  . albuterol (PROAIR HFA) 108 (90 BASE) MCG/ACT inhaler Inhale 2 puffs into the lungs 2 (two) times daily as needed.  8.5 g  5  . Calcium Citrate-Vitamin D (CALCIUM CITRATE + PO) Take 600 mg by mouth 2 (two) times daily.       . Cholecalciferol (VITAMIN D) 2000 UNITS tablet Take 2,000 Units by mouth  daily.        . clindamycin (CLEOCIN) 150 MG capsule Take 600 mg by mouth. Prn dental visits      . conjugated estrogens (PREMARIN) vaginal cream Place vaginally daily.        . diclofenac sodium (VOLTAREN) 1 % GEL Apply 1 application topically 3 (three) times daily.       Marland Kitchen docusate sodium (COLACE) 100 MG capsule Take 200 mg by mouth 3 (three) times daily as needed.       Marland Kitchen HYDROcodone-acetaminophen (LORTAB) 7.5-500 MG/15ML solution Take 1 tsp by mouth 3 times a day as needed for pain.  240 mL  0  . lansoprazole (PREVACID) 15 MG capsule Take 15 mg by mouth daily.        Marland Kitchen loratadine (CLARITIN) 10 MG tablet Take 10 mg by mouth daily.        . Multiple Vitamin (MULTIVITAMIN) tablet Take 1 tablet by mouth daily.        . Omega-3 Fatty Acids (FISH OIL) 1200 MG CAPS Take by mouth 2 (two) times daily.       . Probiotic Product (ALIGN) 4 MG CAPS Take by mouth daily.        . Psyllium (METAMUCIL) 30.9 %  POWD Take by mouth daily.           patient denies chest pain, shortness of breath, orthopnea. Denies lower extremity edema, abdominal pain, change in appetite, change in bowel movements. Patient denies rashes, musculoskeletal complaints. No other specific complaints in a complete review of systems.   BP 132/82  Pulse 80  Temp(Src) 98.4 F (36.9 C) (Oral)  Wt 133 lb (60.328 kg)  Well-developed well-nourished female in no acute distress. HEENT exam atraumatic, normocephalic, extraocular muscles are intact. Neck is supple. No jugular venous distention no thyromegaly. Chest clear to auscultation without increased work of breathing. Cardiac exam S1 and S2 are regular. Abdominal exam active bowel sounds, soft, nontender.

## 2012-02-26 NOTE — Assessment & Plan Note (Signed)
No known recurrent disease. Reviewed CBC from oncologist.

## 2012-02-26 NOTE — Assessment & Plan Note (Signed)
She is followed by pain management. Her pain is reasonably well-controlled. Continue current medications and continue followup with pain management.

## 2012-02-26 NOTE — Assessment & Plan Note (Signed)
Resolved. Continue iron supplementation.

## 2012-03-11 ENCOUNTER — Other Ambulatory Visit: Payer: Self-pay | Admitting: *Deleted

## 2012-03-11 MED ORDER — HYDROCODONE-ACETAMINOPHEN 7.5-500 MG/15ML PO SOLN: ORAL | Status: DC

## 2012-03-15 DIAGNOSIS — IMO0002 Reserved for concepts with insufficient information to code with codable children: Secondary | ICD-10-CM | POA: Diagnosis not present

## 2012-03-15 DIAGNOSIS — M545 Low back pain, unspecified: Secondary | ICD-10-CM | POA: Diagnosis not present

## 2012-04-04 ENCOUNTER — Emergency Department (HOSPITAL_COMMUNITY): Payer: Medicare Other

## 2012-04-04 ENCOUNTER — Observation Stay (HOSPITAL_COMMUNITY)
Admission: EM | Admit: 2012-04-04 | Discharge: 2012-04-05 | Disposition: A | Discharge status: Home / Self Care | Payer: Medicare Other | Source: Ambulatory Visit | Attending: Internal Medicine | Admitting: Internal Medicine

## 2012-04-04 ENCOUNTER — Other Ambulatory Visit: Payer: Self-pay

## 2012-04-04 ENCOUNTER — Encounter (HOSPITAL_COMMUNITY): Payer: Self-pay | Admitting: Emergency Medicine

## 2012-04-04 DIAGNOSIS — C189 Malignant neoplasm of colon, unspecified: Secondary | ICD-10-CM | POA: Diagnosis not present

## 2012-04-04 DIAGNOSIS — I2510 Atherosclerotic heart disease of native coronary artery without angina pectoris: Secondary | ICD-10-CM | POA: Diagnosis not present

## 2012-04-04 DIAGNOSIS — E782 Mixed hyperlipidemia: Secondary | ICD-10-CM | POA: Diagnosis not present

## 2012-04-04 DIAGNOSIS — G459 Transient cerebral ischemic attack, unspecified: Principal | ICD-10-CM | POA: Diagnosis present

## 2012-04-04 DIAGNOSIS — E785 Hyperlipidemia, unspecified: Secondary | ICD-10-CM | POA: Diagnosis present

## 2012-04-04 DIAGNOSIS — R4701 Aphasia: Secondary | ICD-10-CM | POA: Diagnosis not present

## 2012-04-04 DIAGNOSIS — R9431 Abnormal electrocardiogram [ECG] [EKG]: Secondary | ICD-10-CM | POA: Diagnosis not present

## 2012-04-04 DIAGNOSIS — Z85038 Personal history of other malignant neoplasm of large intestine: Secondary | ICD-10-CM | POA: Diagnosis present

## 2012-04-04 DIAGNOSIS — R4789 Other speech disturbances: Secondary | ICD-10-CM | POA: Diagnosis not present

## 2012-04-04 LAB — CBC
HCT: 36.8 % (ref 36.0–46.0)
Hemoglobin: 12.1 g/dL (ref 12.0–15.0)
MCH: 29.2 pg (ref 26.0–34.0)
MCHC: 32.9 g/dL (ref 30.0–36.0)
MCV: 88.9 fL (ref 78.0–100.0)
Platelets: 267 10*3/uL (ref 150–400)
RBC: 4.14 MIL/uL (ref 3.87–5.11)
RDW: 12.9 % (ref 11.5–15.5)
WBC: 7.7 10*3/uL (ref 4.0–10.5)

## 2012-04-04 LAB — COMPREHENSIVE METABOLIC PANEL
ALT: 14 U/L (ref 0–35)
AST: 30 U/L (ref 0–37)
Albumin: 3.6 g/dL (ref 3.5–5.2)
Alkaline Phosphatase: 55 U/L (ref 39–117)
BUN: 11 mg/dL (ref 6–23)
CO2: 28 mEq/L (ref 19–32)
Calcium: 9.4 mg/dL (ref 8.4–10.5)
Chloride: 99 mEq/L (ref 96–112)
Creatinine, Ser: 0.59 mg/dL (ref 0.50–1.10)
GFR calc Af Amer: >90 mL/min (ref >90)
GFR calc non Af Amer: 79 mL/min — ABNORMAL LOW (ref >90)
Glucose, Bld: 118 mg/dL — ABNORMAL HIGH (ref 70–99)
Potassium: 3.6 mEq/L (ref 3.5–5.1)
Sodium: 135 mEq/L (ref 135–145)
Total Bilirubin: 0.3 mg/dL (ref 0.3–1.2)
Total Protein: 6.7 g/dL (ref 6.0–8.3)

## 2012-04-04 LAB — PROTIME-INR
INR: 1 (ref 0.00–1.49)
Prothrombin Time: 13.4 seconds (ref 11.6–15.2)

## 2012-04-04 LAB — CK TOTAL AND CKMB (NOT AT ARMC)
CK, MB: 3.1 ng/mL (ref 0.3–4.0)
Relative Index: 2.8 — ABNORMAL HIGH (ref 0.0–2.5)
Total CK: 112 U/L (ref 7–177)

## 2012-04-04 LAB — DIFFERENTIAL
Basophils Absolute: 0 10*3/uL (ref 0.0–0.1)
Basophils Relative: 0 % (ref 0–1)
Eosinophils Absolute: 0.1 10*3/uL (ref 0.0–0.7)
Eosinophils Relative: 2 % (ref 0–5)
Lymphocytes Relative: 32 % (ref 12–46)
Lymphs Abs: 2.4 10*3/uL (ref 0.7–4.0)
Monocytes Absolute: 0.5 10*3/uL (ref 0.1–1.0)
Monocytes Relative: 6 % (ref 3–12)
Neutro Abs: 4.6 10*3/uL (ref 1.7–7.7)
Neutrophils Relative %: 60 % (ref 43–77)

## 2012-04-04 LAB — APTT: aPTT: 30 seconds (ref 24–37)

## 2012-04-04 LAB — TROPONIN I: Troponin I: <0.3 ng/mL (ref <0.30)

## 2012-04-04 MED ORDER — ACETAMINOPHEN 325 MG PO TABS
650.0000 mg | ORAL_TABLET | ORAL | Status: DC | PRN
  Administered 2012-04-05: 650 mg via ORAL
  Filled 2012-04-04: qty 2

## 2012-04-04 MED ORDER — PANTOPRAZOLE SODIUM 20 MG PO TBEC
20.0000 mg | DELAYED_RELEASE_TABLET | Freq: Every day | ORAL | Status: DC
  Administered 2012-04-05: 20 mg via ORAL
  Filled 2012-04-04: qty 1

## 2012-04-04 MED ORDER — ONE-DAILY MULTI VITAMINS PO TABS: 1.0000 | ORAL_TABLET | Freq: Every day | ORAL | Status: DC

## 2012-04-04 MED ORDER — VITAMIN D3 25 MCG (1000 UNIT) PO TABS
2000.0000 [IU] | ORAL_TABLET | Freq: Every day | ORAL | Status: DC
  Administered 2012-04-05: 2000 [IU] via ORAL
  Filled 2012-04-04: qty 2

## 2012-04-04 MED ORDER — SODIUM CHLORIDE 0.9 % IV SOLN: INTRAVENOUS | Status: DC

## 2012-04-04 MED ORDER — ASPIRIN 325 MG PO TABS
325.0000 mg | ORAL_TABLET | Freq: Every day | ORAL | Status: DC
  Administered 2012-04-04 – 2012-04-05 (×2): 325 mg via ORAL
  Filled 2012-04-04 (×2): qty 1

## 2012-04-04 MED ORDER — DOCUSATE SODIUM 100 MG PO CAPS
200.0000 mg | ORAL_CAPSULE | Freq: Every day | ORAL | Status: DC
  Administered 2012-04-05: 200 mg via ORAL
  Filled 2012-04-04: qty 1

## 2012-04-04 MED ORDER — ENOXAPARIN SODIUM 40 MG/0.4ML ~~LOC~~ SOLN
40.0000 mg | SUBCUTANEOUS | Status: DC
  Administered 2012-04-04: 40 mg via SUBCUTANEOUS
  Filled 2012-04-04 (×2): qty 0.4

## 2012-04-04 MED ORDER — ADULT MULTIVITAMIN W/MINERALS CH
1.0000 | ORAL_TABLET | Freq: Every day | ORAL | Status: DC
  Administered 2012-04-05: 1 via ORAL
  Filled 2012-04-04: qty 1

## 2012-04-04 MED ORDER — VITAMIN D 50 MCG (2000 UT) PO TABS: 2000.0000 [IU] | ORAL_TABLET | Freq: Every day | ORAL | Status: DC

## 2012-04-04 NOTE — Progress Notes (Signed)
Referring Physician: Dr. Carleene Cooper    Chief Complaint: Difficulty with speech.  HPI: [name] Barker is an 78 y.o. female hyperlipidemia and coronary artery disease, presenting with symptoms of recurrent speech output difficulty. She had an episode of transient speech difficulty with mild left-sided headache yesterday which cleared after about 10 minutes. She did not seek medical attention. She had a recurrent spell today at about 11:30. Symptoms are still present when she arrived in the emergency room. She was placed in code stroke status. CT scan of her head showed no acute intracranial abnormality. Symptoms markedly return with the return of speech output to normal following CT scan. NIH stroke score was 0 following improvement. Patient has been taking aspirin daily. She had no symptoms of stroke nor TIA prior to yesterday.  LSN: 11:30 today tPA Given: No: Rapid improvement MRankin: 0  Past Medical History  Diagnosis Date  . ADENOCARCINOMA, COLON 08/19/2007  . ANEMIA-NOS 11/17/2006  . CAD 06/21/2010  . DIVERTICULOSIS-COLON 05/30/2010  . GERD 11/17/2006  . HYPERLIPIDEMIA 11/17/2006  . LOW BACK PAIN, CHRONIC 02/04/2010  . OSTEOPENIA 11/17/2006    No family history on file.   Medications: Prior to Admission:  Calcium with vitamin D 2 tablets daily Vitamin D 2000 units daily Clindamycin 150 mg 4 tablets prior to dental visits Premarin, vaginally administered Voltaren 1% gel 3 times a day Colace 100 mg 2 3 times a day when necessary Lortab one 3 times a day when necessary Prevacid 15 mg per day Claritin 10 mg per day Multivitamin 1 per day Omega-3 fatty acid 1200 mg, 2 per day Probiotic product 4 mg per day Metamucil daily  Physical Examination: Blood pressure 140/60, pulse 72, temperature 98.3 F (36.8 C), temperature source Oral, resp. rate 17, SpO2 98.00%.  Neurologic Examination: Mental Status: Alert, oriented, thought content appropriate.  Speech fluent without  evidence of aphasia. Able to follow commands without difficulty. Cranial Nerves: II-Visual fields were normal. III/IV/VI-Pupils were equal and reacted. Extraocular movements were full and conjugate.    V/VII-no facial numbness and no facial weakness. VIII-normal. X-normal speech. XII-midline tongue extension Motor: 5/5 bilaterally with normal tone and bulk Sensory: Normal throughout. Deep Tendon Reflexes: 2+ and symmetric. Plantars: Flexor bilaterally Cerebellar: Normal finger-to-nose testing.  Ct Head Wo Contrast  04/04/2012  *RADIOLOGY REPORT*  Clinical Data: Code stroke  CT HEAD WITHOUT CONTRAST  Technique:  Contiguous axial images were obtained from the base of the skull through the vertex without contrast.  Comparison: None.  Findings: There is diffuse patchy low density throughout the subcortical and periventricular white matter consistent with chronic small vessel ischemic change.  There is prominence of the sulci and ventricles consistent with brain atrophy.  There is no evidence for acute brain infarct, hemorrhage or mass.  The paranasal sinuses and mastoid air cells are clear.  The skull is intact.  IMPRESSION:  1.  No acute intracranial abnormalities.  Original Report Authenticated By: Rosealee Albee, M.D.    Assessment: 78 y.o. female  presenting with probable recurrent transient ischemic attack with transient expressive aphasia which has resolved at this point. Small subcortical left cerebral infarction is less likely but cannot be completely ruled out at this point.  Stroke Risk Factors - hyperlipidemia  Plan: 1. HgbA1c, fasting lipid panel 2. MRI, MRA  of the brain without contrast 3. Speech consult 4. Echocardiogram 5. Carotid dopplers 6. Prophylactic therapy-Antiplatelet med: Plavix 75 mg per day  7. Risk factor modification 8. Telemetry monitoring  C.R. Roseanne Reno,  MD [facility] [phone]  04/04/2012, 2:50 PM

## 2012-04-04 NOTE — ED Notes (Signed)
Inc. expressive aphagia since yesterday; c/o h/a behind lt. Eye. "able to write down what she could not articulate."  According to family, "pt. Much better after CT."

## 2012-04-04 NOTE — ED Notes (Addendum)
Kristi charge nurse notified of patients symptoms and code stroke was paged out. Last seen normal by family was at 12pm today.  Patient is exhibiting expressive aphasia, no facial droop, slurred speech, or arm drift noted.

## 2012-04-04 NOTE — ED Provider Notes (Addendum)
History     CSN: [id number]  Arrival date & time 04/04/12  1336   First MD Initiated Contact with Patient 04/04/12 1456      Chief Complaint  Patient presents with  . Code Stroke    (Consider location/radiation/quality/duration/timing/severity/associated sxs/prior treatment) HPI Comments: Patient is an 78 year old woman who had an episode of difficulty with speech. She knew what she wanted to say but couldn't say it. This lasted about 2 hours yesterday and subsided. It recurred today and persist to some extent, though it's improving now. Therefore she was brought to the [facility] as a code stroke. Her CT of the head was negative. She was seen by Noel Christmas M.D., neurologist, who advised that she did not need any neurologic intervention, but did need admission for TIA workup.  Patient is a 78 y.o. female presenting with Acute Neurological Problem.  Cerebrovascular Accident This is a new problem. The current episode started yesterday. Episode frequency: Intermittent episodes of dysphasia. The problem has been gradually improving. Pertinent negatives include no chest pain, no abdominal pain, no headaches and no shortness of breath. The symptoms are aggravated by nothing. The symptoms are relieved by nothing. She has tried nothing for the symptoms.    Past Medical History  Diagnosis Date  . ADENOCARCINOMA, COLON 08/19/2007  . ANEMIA-NOS 11/17/2006  . CAD 06/21/2010  . DIVERTICULOSIS-COLON 05/30/2010  . GERD 11/17/2006  . HYPERLIPIDEMIA 11/17/2006  . LOW BACK PAIN, CHRONIC 02/04/2010  . OSTEOPENIA 11/17/2006    Past Surgical History  Procedure Date  . Appendectomy   . Back surgery   . Colon surgery   . Abdominal hysterectomy   . Joint replacement     No family history on file.  History  Substance Use Topics  . Smoking status: Never Smoker   . Smokeless tobacco: Never Used  . Alcohol Use: No    OB History    Grav Para Term Preterm Abortions TAB SAB Ect Mult Living                 Review of Systems  Constitutional: Negative.   HENT:       She is quite deaf.   Eyes: Negative.   Respiratory: Negative.  Negative for shortness of breath.   Cardiovascular: Negative for chest pain.  Gastrointestinal: Negative for abdominal pain.  Genitourinary: Negative.   Musculoskeletal: Negative.   Skin: Negative.   Neurological: Positive for speech difficulty. Negative for headaches.  Psychiatric/Behavioral: Negative.     Allergies  Methenamine mandelate; Morphine sulfate; Nsaids; Penicillins; Promethazine hcl; and Statins  Home Medications   Current Outpatient Rx  Name Route Sig Dispense Refill  . ALBUTEROL SULFATE HFA 108 (90 BASE) MCG/ACT IN AERS Inhalation Inhale 2 puffs into the lungs 2 (two) times daily as needed. 8.5 g 5  . CALCIUM CITRATE + PO Oral Take 600 mg by mouth 2 (two) times daily.     Marland Kitchen VITAMIN D 2000 UNITS PO TABS Oral Take 2,000 Units by mouth daily.      Marland Kitchen CLINDAMYCIN HCL 150 MG PO CAPS Oral Take 600 mg by mouth. Prn dental visits    . ESTROGENS, CONJUGATED 0.625 MG/GM VA CREA Vaginal Place vaginally daily.      Marland Kitchen DICLOFENAC SODIUM 1 % TD GEL Topical Apply 1 application topically 3 (three) times daily.     Marland Kitchen DOCUSATE SODIUM 100 MG PO CAPS Oral Take 200 mg by mouth 3 (three) times daily as needed.     Marland Kitchen HYDROCODONE-ACETAMINOPHEN  7.5-500 MG/15ML PO SOLN  Take 1 tsp by mouth 3 times a day as needed for pain. 240 mL 2  . LANSOPRAZOLE 15 MG PO CPDR Oral Take 15 mg by mouth daily.      Marland Kitchen LORATADINE 10 MG PO TABS Oral Take 10 mg by mouth daily.      Marland Kitchen ONE-DAILY MULTI VITAMINS PO TABS Oral Take 1 tablet by mouth daily.      Marland Kitchen FISH OIL 1200 MG PO CAPS Oral Take by mouth 2 (two) times daily.     Marland Kitchen ALIGN 4 MG PO CAPS Oral Take by mouth daily.      . PSYLLIUM 30.9 % PO POWD Oral Take by mouth daily.        BP 140/60  Pulse 72  Temp(Src) 98.3 F (36.8 C) (Oral)  Resp 17  SpO2 98%  Physical Exam  Nursing note and vitals  reviewed. Constitutional: She is oriented to person, place, and time. She appears well-developed and well-nourished. No distress.  HENT:  Head: Normocephalic and atraumatic.  Right Ear: External ear normal.  Left Ear: External ear normal.  Mouth/Throat: Oropharynx is clear and moist.  Eyes: Conjunctivae and EOM are normal. Pupils are equal, round, and reactive to light.  Neck: Normal range of motion. Neck supple.       No carotid bruit.  Cardiovascular: Normal rate, regular rhythm and normal heart sounds.   Pulmonary/Chest: Effort normal and breath sounds normal.  Abdominal: Soft. Bowel sounds are normal.  Musculoskeletal: Normal range of motion. She exhibits no edema and no tenderness.  Neurological: She is alert and oriented to person, place, and time.       She has a mild expressive dysphasia.   Skin: Skin is warm and dry.  Psychiatric: She has a normal mood and affect. Her behavior is normal.    ED Course  Procedures (including critical care time)  Labs Reviewed  COMPREHENSIVE METABOLIC PANEL - Abnormal; Notable for the following:    Glucose, Bld 118 (*)    GFR calc non Af Amer 79 (*)    All other components within normal limits  CK TOTAL AND CKMB - Abnormal; Notable for the following:    Relative Index 2.8 (*)    All other components within normal limits  PROTIME-INR  APTT  CBC  DIFFERENTIAL  TROPONIN I   Ct Head Wo Contrast  04/04/2012  *RADIOLOGY REPORT*  Clinical Data: Code stroke  CT HEAD WITHOUT CONTRAST  Technique:  Contiguous axial images were obtained from the base of the skull through the vertex without contrast.  Comparison: None.  Findings: There is diffuse patchy low density throughout the subcortical and periventricular white matter consistent with chronic small vessel ischemic change.  There is prominence of the sulci and ventricles consistent with brain atrophy.  There is no evidence for acute brain infarct, hemorrhage or mass.  The paranasal sinuses and  mastoid air cells are clear.  The skull is intact.  IMPRESSION:  1.  No acute intracranial abnormalities.  Original Report Authenticated By: Rosealee Albee, M.D.    Date: 04/04/2012  Rate: 73  Rhythm: normal sinus rhythm  QRS Axis: left  Intervals: normal  ST/T Wave abnormalities: normal  Conduction Disutrbances:none  Narrative Interpretation: Borderline EKG  Old EKG Reviewed: none available  4:36 PM Pt was seen initially by Dr. Roseanne Reno and then by me.  Code stroke workup was initiated.  Her lab workup was negative, including MRI/MRA of the brain.  Call to  [facility] to admit her for her TIA workup.  Results for orders placed during the hospital encounter of 04/04/12  [facility]      Component Value Range   Prothrombin Time 13.4  11.6 - 15.2 (seconds)   INR 1.00  0.00 - 1.49   APTT      Component Value Range   aPTT 30  24 - 37 (seconds)  CBC      Component Value Range   WBC 7.7  4.0 - 10.5 (K/uL)   RBC 4.14  3.87 - 5.11 (MIL/uL)   Hemoglobin 12.1  12.0 - 15.0 (g/dL)   HCT 16.1  09.6 - 04.5 (%)   MCV 88.9  78.0 - 100.0 (fL)   MCH 29.2  26.0 - 34.0 (pg)   MCHC 32.9  30.0 - 36.0 (g/dL)   RDW 40.9  81.1 - 91.4 (%)   Platelets 267  150 - 400 (K/uL)  DIFFERENTIAL      Component Value Range   Neutrophils Relative 60  43 - 77 (%)   Neutro Abs 4.6  1.7 - 7.7 (K/uL)   Lymphocytes Relative 32  12 - 46 (%)   Lymphs Abs 2.4  0.7 - 4.0 (K/uL)   Monocytes Relative 6  3 - 12 (%)   Monocytes Absolute 0.5  0.1 - 1.0 (K/uL)   Eosinophils Relative 2  0 - 5 (%)   Eosinophils Absolute 0.1  0.0 - 0.7 (K/uL)   Basophils Relative 0  0 - 1 (%)   Basophils Absolute 0.0  0.0 - 0.1 (K/uL)  COMPREHENSIVE METABOLIC PANEL      Component Value Range   Sodium 135  135 - 145 (mEq/L)   Potassium 3.6  3.5 - 5.1 (mEq/L)   Chloride 99  96 - 112 (mEq/L)   CO2 28  19 - 32 (mEq/L)   Glucose, Bld 118 (*) 70 - 99 (mg/dL)   BUN 11  6 - 23 (mg/dL)   Creatinine, Ser 7.82  0.50 - 1.10 (mg/dL)    Calcium 9.4  8.4 - 10.5 (mg/dL)   Total Protein 6.7  6.0 - 8.3 (g/dL)   Albumin 3.6  3.5 - 5.2 (g/dL)   AST 30  0 - 37 (U/L)   ALT 14  0 - 35 (U/L)   Alkaline Phosphatase 55  39 - 117 (U/L)   Total Bilirubin 0.3  0.3 - 1.2 (mg/dL)   GFR calc non Af Amer 79 (*) >90 (mL/min)   GFR calc Af Amer >90  >90 (mL/min)  CK TOTAL AND CKMB      Component Value Range   Total CK 112  7 - 177 (U/L)   CK, MB 3.1  0.3 - 4.0 (ng/mL)   Relative Index 2.8 (*) 0.0 - 2.5   TROPONIN I      Component Value Range   Troponin I <0.30  <0.30 (ng/mL)   Ct Head Wo Contrast  04/04/2012  *RADIOLOGY REPORT*  Clinical Data: Code stroke  CT HEAD WITHOUT CONTRAST  Technique:  Contiguous axial images were obtained from the base of the skull through the vertex without contrast.  Comparison: None.  Findings: There is diffuse patchy low density throughout the subcortical and periventricular white matter consistent with chronic small vessel ischemic change.  There is prominence of the sulci and ventricles consistent with brain atrophy.  There is no evidence for acute brain infarct, hemorrhage or mass.  The paranasal sinuses and mastoid air cells are clear.  The skull is  intact.  IMPRESSION:  1.  No acute intracranial abnormalities.  Original Report Authenticated By: Rosealee Albee, M.D.   Mr Angiogram Head Wo Contrast  04/04/2012  *RADIOLOGY REPORT*  Clinical Data:  Expressive aphasia.  Rule out CVA.  Colon cancer.  MRI HEAD WITHOUT CONTRAST MRA HEAD WITHOUT CONTRAST  Technique:  Multiplanar, multiecho pulse sequences of the brain and surrounding structures were obtained without intravenous contrast. Angiographic images of the head were obtained using MRA technique without contrast.  Comparison:  CT 04/04/2012  MRI HEAD  Findings:  Negative for acute infarct.  Generalized atrophy. Chronic microvascular ischemia in the cerebral white matter of a mild degree.  Basal ganglia and brainstem are normal.  Negative for intracranial  hemorrhage.  No infarct or mass.  Paranasal sinuses are clear.  IMPRESSION: Chronic microvascular ischemia.  No acute infarct or mass.  MRA HEAD  Findings: Both vertebral arteries are patent to the basilar.  Left vertebral artery is dominant.  PICA, superior cerebellar, and posterior cerebral arteries are patent bilaterally.  Fetal origin of the left posterior cerebral artery with hypoplastic left P1 segment.  This is a normal variation.  Internal carotid artery is patent bilaterally without stenosis. Anterior and middle cerebral arteries are patent bilaterally. Negative for cerebral aneurysm.  IMPRESSION: Negative  Original Report Authenticated By: Camelia Phenes, M.D.   Mr Brain Wo Contrast  04/04/2012  *RADIOLOGY REPORT*  Clinical Data:  Expressive aphasia.  Rule out CVA.  Colon cancer.  MRI HEAD WITHOUT CONTRAST MRA HEAD WITHOUT CONTRAST  Technique:  Multiplanar, multiecho pulse sequences of the brain and surrounding structures were obtained without intravenous contrast. Angiographic images of the head were obtained using MRA technique without contrast.  Comparison:  CT 04/04/2012  MRI HEAD  Findings:  Negative for acute infarct.  Generalized atrophy. Chronic microvascular ischemia in the cerebral white matter of a mild degree.  Basal ganglia and brainstem are normal.  Negative for intracranial hemorrhage.  No infarct or mass.  Paranasal sinuses are clear.  IMPRESSION: Chronic microvascular ischemia.  No acute infarct or mass.  MRA HEAD  Findings: Both vertebral arteries are patent to the basilar.  Left vertebral artery is dominant.  PICA, superior cerebellar, and posterior cerebral arteries are patent bilaterally.  Fetal origin of the left posterior cerebral artery with hypoplastic left P1 segment.  This is a normal variation.  Internal carotid artery is patent bilaterally without stenosis. Anterior and middle cerebral arteries are patent bilaterally. Negative for cerebral aneurysm.  IMPRESSION: Negative   Original Report Authenticated By: Camelia Phenes, M.D.   4:42 PM Case discussed with Dr. Lonia Blood.    1. Transient ischemic attack            Carleene Cooper III, MD 04/04/12 1643  Carleene Cooper III, MD 04/04/12 [phone]

## 2012-04-04 NOTE — H&P (Signed)
PCP:   Judie Petit, MD, MD   Chief Complaint:  aphasia  HPI: 78 yo woman with hx of HL, on aspirin daily presented to the ED as code stroke after sudden onset of expressive aphasia this AM. Evaluated by Neurology and no acute stroke was found  Her symptoms have cleared by now and MRi is negative for acute stroke.    Review of Systems:  The patient denies anorexia, fever, weight loss,, vision loss, decreased hearing, hoarseness, chest pain, syncope, dyspnea on exertion, peripheral edema, balance deficits, hemoptysis, abdominal pain, melena, hematochezia, severe indigestion/heartburn, hematuria, incontinence, genital sores, muscle weakness, suspicious skin lesions, transient blindness, difficulty walking, depression, unusual weight change, abnormal bleeding,    Past Medical History: Past Medical History  Diagnosis Date  . ADENOCARCINOMA, COLON 08/19/2007  . ANEMIA-NOS 11/17/2006  . CAD 06/21/2010  . DIVERTICULOSIS-COLON 05/30/2010  . GERD 11/17/2006  . HYPERLIPIDEMIA 11/17/2006  . LOW BACK PAIN, CHRONIC 02/04/2010  . OSTEOPENIA 11/17/2006   Past Surgical History  Procedure Date  . Appendectomy   . Back surgery   . Colon surgery   . Abdominal hysterectomy   . Joint replacement     Medications: Prior to Admission medications   Medication Sig Start Date End Date Taking? Authorizing Provider  Calcium Citrate-Vitamin D (CALCIUM CITRATE + PO) Take 600 mg by mouth 2 (two) times daily.    Yes Historical Provider, MD  Cholecalciferol (VITAMIN D) 2000 UNITS tablet Take 2,000 Units by mouth daily.     Yes Historical Provider, MD  diclofenac sodium (VOLTAREN) 1 % GEL Apply 1 application topically 3 (three) times daily as needed. For pain   Yes Historical Provider, MD  docusate sodium (COLACE) 100 MG capsule Take 200 mg by mouth daily.    Yes Historical Provider, MD  glycerin adult (GLYCERIN ADULT) 2 G SUPP Place 1 suppository rectally every morning. Right after breakfast   Yes  Historical Provider, MD  HYDROcodone-acetaminophen (LORTAB) 7.5-500 MG/15ML solution Take 1 tsp by mouth 3 times a day as needed for pain. 03/11/12  Yes Lindley Magnus, MD  ibuprofen (ADVIL,MOTRIN) 200 MG tablet Take 800 mg by mouth at bedtime as needed. For pain before sleep   Yes Historical Provider, MD  lansoprazole (PREVACID) 15 MG capsule Take 15 mg by mouth daily.     Yes Historical Provider, MD  loratadine (CLARITIN) 10 MG tablet Take 10 mg by mouth daily.     Yes Historical Provider, MD  Multiple Vitamin (MULTIVITAMIN) tablet Take 1 tablet by mouth daily.     Yes Historical Provider, MD  Omega-3 Fatty Acids (FISH OIL) 1200 MG CAPS Take by mouth 2 (two) times daily.    Yes Historical Provider, MD  Probiotic Product (ALIGN) 4 MG CAPS Take by mouth daily.     Yes Historical Provider, MD  Psyllium (METAMUCIL) 30.9 % POWD Take by mouth daily.     Yes Historical Provider, MD  albuterol (PROAIR HFA) 108 (90 BASE) MCG/ACT inhaler Inhale 2 puffs into the lungs 2 (two) times daily as needed. 11/25/11   Lindley Magnus, MD  Patient also takes aspirin every day but the daughter forgot to mention it to the pharm tech    Allergies:   Allergies  Allergen Reactions  . Methenamine Mandelate     REACTION: burning sensation  . Morphine Sulfate     REACTION: blister  . Penicillins     REACTION: urticaria (hives)  . Promethazine Hcl     REACTION: hallucinations  .  Statins Other (See Comments)    Muscle pain     Social History:  reports that she has never smoked. She has never used smokeless tobacco. She reports that she does not drink alcohol or use illicit drugs.  History   Social History Narrative  . No narrative on file   Lives with husband in a retirement community independent liv[facility]    Family History: No family history on file.  Physical Exam: Filed Vitals:   04/04/12 1404 04/04/12 1703  BP: 140/60   Pulse: 72   Temp: 98.3 F (36.8 C) 98.2 F (36.8 C)  TempSrc: Oral   Resp:  17   SpO2: 98%    General appearance: alert, cooperative, appears stated age and no distress Head: Normocephalic, without obvious abnormality, atraumatic Eyes: conjunctivae/corneas clear. PERRL, EOM's intact. Fundi benign. Throat: lips, mucosa, and tongue normal; teeth and gums normal Neck: no adenopathy, no carotid bruit, no JVD, supple, symmetrical, trachea midline and thyroid not enlarged, symmetric, no tenderness/mass/nodules Back: symmetric, no curvature. ROM normal. No CVA tenderness. Resp: clear to auscultation bilaterally Cardio: regular rate and rhythm, S1, S2 normal, no murmur, click, rub or gallop Extremities: extremities normal, atraumatic, no cyanosis or edema Pulses: 2+ and symmetric Skin: Skin color, texture, turgor normal. No rashes or lesions Neurologic: Alert and oriented X 3, normal strength and tone. Normal symmetric reflexes. Normal coordination and gait   Labs on Admission:   [facility] 04/04/12 1342  NA 135  K 3.6  CL 99  CO2 28  GLUCOSE 118*  BUN 11  CREATININE 0.59  CALCIUM 9.4  MG --  PHOS --    Basename 04/04/12 1342  AST 30  ALT 14  ALKPHOS 55  BILITOT 0.3  PROT 6.7  ALBUMIN 3.6   No results found for this basename: LIPASE:2,AMYLASE:2 in the last 72 hours  Basename 04/04/12 1342  WBC 7.7  NEUTROABS 4.6  HGB 12.1  HCT 36.8  MCV 88.9  PLT 267    Basename 04/04/12 1343  CKTOTAL 112  CKMB 3.1  CKMBINDEX --  TROPONINI <0.30   No results found for this basename: TSH,T4TOTAL,FREET3,T3FREE,THYROIDAB in the last 72 hours No results found for this basename: VITAMINB12:2,FOLATE:2,FERRITIN:2,TIBC:2,IRON:2,RETICCTPCT:2 in the last 72 hours  Radiological Exams on Admission: Ct Head Wo Contrast  04/04/2012  *RADIOLOGY REPORT*  Clinical Data: Code stroke  CT HEAD WITHOUT CONTRAST  Technique:  Contiguous axial images were obtained from the base of the skull through the vertex without contrast.  Comparison: None.  Findings: There is diffuse patchy  low density throughout the subcortical and periventricular white matter consistent with chronic small vessel ischemic change.  There is prominence of the sulci and ventricles consistent with brain atrophy.  There is no evidence for acute brain infarct, hemorrhage or mass.  The paranasal sinuses and mastoid air cells are clear.  The skull is intact.  IMPRESSION:  1.  No acute intracranial abnormalities.  Original Report Authenticated By: Rosealee Albee, M.D.   Mr Angiogram Head Wo Contrast  04/04/2012  *RADIOLOGY REPORT*  Clinical Data:  Expressive aphasia.  Rule out CVA.  Colon cancer.  MRI HEAD WITHOUT CONTRAST MRA HEAD WITHOUT CONTRAST  Technique:  Multiplanar, multiecho pulse sequences of the brain and surrounding structures were obtained without intravenous contrast. Angiographic images of the head were obtained using MRA technique without contrast.  Comparison:  CT 04/04/2012  MRI HEAD  Findings:  Negative for acute infarct.  Generalized atrophy. Chronic microvascular ischemia in the cerebral white matter of  a mild degree.  Basal ganglia and brainstem are normal.  Negative for intracranial hemorrhage.  No infarct or mass.  Paranasal sinuses are clear.  IMPRESSION: Chronic microvascular ischemia.  No acute infarct or mass.  MRA HEAD  Findings: Both vertebral arteries are patent to the basilar.  Left vertebral artery is dominant.  PICA, superior cerebellar, and posterior cerebral arteries are patent bilaterally.  Fetal origin of the left posterior cerebral artery with hypoplastic left P1 segment.  This is a normal variation.  Internal carotid artery is patent bilaterally without stenosis. Anterior and middle cerebral arteries are patent bilaterally. Negative for cerebral aneurysm.  IMPRESSION: Negative  Original Report Authenticated By: Camelia Phenes, M.D.   Mr Brain Wo Contrast  04/04/2012  *RADIOLOGY REPORT*  Clinical Data:  Expressive aphasia.  Rule out CVA.  Colon cancer.  MRI HEAD WITHOUT CONTRAST MRA  HEAD WITHOUT CONTRAST  Technique:  Multiplanar, multiecho pulse sequences of the brain and surrounding structures were obtained without intravenous contrast. Angiographic images of the head were obtained using MRA technique without contrast.  Comparison:  CT 04/04/2012  MRI HEAD  Findings:  Negative for acute infarct.  Generalized atrophy. Chronic microvascular ischemia in the cerebral white matter of a mild degree.  Basal ganglia and brainstem are normal.  Negative for intracranial hemorrhage.  No infarct or mass.  Paranasal sinuses are clear.  IMPRESSION: Chronic microvascular ischemia.  No acute infarct or mass.  MRA HEAD  Findings: Both vertebral arteries are patent to the basilar.  Left vertebral artery is dominant.  PICA, superior cerebellar, and posterior cerebral arteries are patent bilaterally.  Fetal origin of the left posterior cerebral artery with hypoplastic left P1 segment.  This is a normal variation.  Internal carotid artery is patent bilaterally without stenosis. Anterior and middle cerebral arteries are patent bilaterally. Negative for cerebral aneurysm.  IMPRESSION: Negative  Original Report Authenticated By: Camelia Phenes, M.D.    Assessment/Plan Present on Admission:  .TIA (transient ischemic attack) .ADENOCARCINOMA, COLON .CAD .HYPERLIPIDEMIA  78 yo woman with transient aphasia. High concern for TIA - ? ICA stenosis.  Plan for 23 hrs obs on telemetry, frequent neuro checks, iv fluids, resume aspirin  Obtain FLP, A1C, 2 D echo and carotid doppler Further plans depending on progress    Dominiq Fontaine 04/04/2012, 5:11 PM

## 2012-04-05 ENCOUNTER — Encounter (HOSPITAL_COMMUNITY): Payer: Self-pay | Admitting: *Deleted

## 2012-04-05 ENCOUNTER — Other Ambulatory Visit: Payer: Self-pay

## 2012-04-05 DIAGNOSIS — I359 Nonrheumatic aortic valve disorder, unspecified: Secondary | ICD-10-CM | POA: Diagnosis not present

## 2012-04-05 DIAGNOSIS — G459 Transient cerebral ischemic attack, unspecified: Secondary | ICD-10-CM | POA: Diagnosis not present

## 2012-04-05 DIAGNOSIS — E782 Mixed hyperlipidemia: Secondary | ICD-10-CM | POA: Diagnosis not present

## 2012-04-05 LAB — LIPID PANEL
Cholesterol: 203 mg/dL — ABNORMAL HIGH (ref 0–200)
HDL: 53 mg/dL (ref >39)
LDL Cholesterol: 132 mg/dL — ABNORMAL HIGH (ref 0–99)
Total CHOL/HDL Ratio: 3.8 RATIO
Triglycerides: 89 mg/dL (ref <150)
VLDL: 18 mg/dL (ref 0–40)

## 2012-04-05 LAB — HEMOGLOBIN A1C
Hgb A1c MFr Bld: 5.8 % — ABNORMAL HIGH (ref <5.7)
Mean Plasma Glucose: 120 mg/dL — ABNORMAL HIGH (ref <117)

## 2012-04-05 MED ORDER — EZETIMIBE 10 MG PO TABS: 10.0000 mg | ORAL_TABLET | Freq: Every day | ORAL | Status: DC

## 2012-04-05 MED ORDER — EZETIMIBE 10 MG PO TABS
10.0000 mg | ORAL_TABLET | Freq: Every day | ORAL | Status: DC
  Filled 2012-04-05: qty 1

## 2012-04-05 MED ORDER — ACETAMINOPHEN 500 MG PO TABS: 1000.0000 mg | ORAL_TABLET | Freq: Four times a day (QID) | ORAL | Status: AC | PRN

## 2012-04-05 MED ORDER — BISACODYL 10 MG RE SUPP
10.0000 mg | Freq: Once | RECTAL | Status: AC
  Administered 2012-04-05: 10 mg via RECTAL
  Filled 2012-04-05: qty 1

## 2012-04-05 MED ORDER — ASPIRIN 325 MG PO TABS: 325.0000 mg | ORAL_TABLET | Freq: Every day | ORAL | Status: DC

## 2012-04-05 NOTE — Progress Notes (Signed)
Her Stroke Team Progress Note  HISTORY [name] Barker is an 78 y.o. female hyperlipidemia and coronary artery disease, presenting with symptoms of recurrent speech output difficulty 04/04/2012. She had an episode of transient speech difficulty with mild left-sided headache 04/03/2012 which cleared after about 10 minutes. She did not seek medical attention. She had a recurrent spell 04/04/2012 at about 11:30. Symptoms are still present when she arrived in the emergency room. She was placed in code stroke status. CT scan of her head showed no acute intracranial abnormality. Symptoms markedly return with the return of speech output to normal following CT scan. NIH stroke score was 0 following improvement. Patient has been taking aspirin daily. She had no symptoms of stroke nor TIA prior to yesterday. Patient was not a TPA candidate secondary to rapid improvement to baseline. She was admitted for further evaluation and treatment.  SUBJECTIVE Her husband is at the bedside. Overall she feels the condition is improved.  She is concerned symptoms may have been caused by stress.  OBJECTIVE Most recent Vital Signs: Filed Vitals:   04/04/12 2008 04/04/12 2100 04/05/12 0247 04/05/12 0505  BP: 104/54 122/69 128/69 105/58  Pulse: 66 69 84 56  Temp: 98.5 F (36.9 C) 98 F (36.7 C) 98.6 F (37 C) 97.5 F (36.4 C)  TempSrc: Oral Oral Oral Oral  Resp: 20 20 20 20   Height:      Weight:      SpO2: 94% 95% 92% 97%   CBG (last 3)  No results found for this basename: GLUCAP:3 in the last 72 hours Intake/Output from previous day:   IV Fluid Intake:     . sodium chloride     Medications   . aspirin  325 mg Oral Daily  . bisacodyl  10 mg Rectal Once  . cholecalciferol  2,000 Units Oral Daily  . docusate sodium  200 mg Oral Daily  . enoxaparin  40 mg Subcutaneous Q24H  . mulitivitamin with minerals  1 tablet Oral Daily  . pantoprazole  20 mg Oral Q1200  . DISCONTD: multivitamin  1 tablet Oral Daily  .  DISCONTD: Vitamin D  2,000 Units Oral Daily  PRN:  acetaminophen  Diet:  Cardiac THIN liquids Activity:   Bathroom privileges DVT Prophylaxis:  Lovenox 40 mg sq daily  CLINICALLY SIGNIFICANT STUDIES CBC    Component Value Date/Time   WBC 7.7 04/04/2012 1342   WBC 5.9 02/10/2012 1145   RBC 4.14 04/04/2012 1342   RBC 3.79 02/10/2012 1145   HGB 12.1 04/04/2012 1342   HGB 11.6 02/10/2012 1145   HCT 36.8 04/04/2012 1342   HCT 34.8 02/10/2012 1145   PLT 267 04/04/2012 1342   PLT 228 02/10/2012 1145   MCV 88.9 04/04/2012 1342   MCV 91.9 02/10/2012 1145   MCH 29.2 04/04/2012 1342   MCH 30.5 02/10/2012 1145   MCHC 32.9 04/04/2012 1342   MCHC 33.2 02/10/2012 1145   RDW 12.9 04/04/2012 1342   RDW 12.9 02/10/2012 1145   LYMPHSABS 2.4 04/04/2012 1342   LYMPHSABS 2.0 02/10/2012 1145   MONOABS 0.5 04/04/2012 1342   MONOABS 0.6 02/10/2012 1145   EOSABS 0.1 04/04/2012 1342   EOSABS 0.1 02/10/2012 1145   BASOSABS 0.0 04/04/2012 1342   BASOSABS 0.0 02/10/2012 1145   CMP    Component Value Date/Time   NA 135 04/04/2012 1342   K 3.6 04/04/2012 1342   CL 99 04/04/2012 1342   CO2 28 04/04/2012 1342   GLUCOSE 118* 04/04/2012  1342   GLUCOSE 97 11/17/2006 1103   BUN 11 04/04/2012 1342   CREATININE 0.59 04/04/2012 1342   CALCIUM 9.4 04/04/2012 1342   PROT 6.7 04/04/2012 1342   ALBUMIN 3.6 04/04/2012 1342   AST 30 04/04/2012 1342   ALT 14 04/04/2012 1342   ALKPHOS 55 04/04/2012 1342   BILITOT 0.3 04/04/2012 1342   GFRNONAA 79* 04/04/2012 1342   GFRAA >90 04/04/2012 1342   COAGS Lab Results  Component Value Date   INR 1.00 04/04/2012   Lipid Panel    Component Value Date/Time   CHOL 203* 04/05/2012 0540   TRIG 89 04/05/2012 0540   HDL 53 04/05/2012 0540   CHOLHDL 3.8 04/05/2012 0540   VLDL 18 04/05/2012 0540   LDLCALC 132* 04/05/2012 0540   HgbA1C  Lab Results  Component Value Date   HGBA1C 5.8* 04/04/2012   Cardiac Panel (last 3 results)  Basename 04/04/12 1343  CKTOTAL 112  CKMB 3.1  TROPONINI <0.30  RELINDX 2.8*   Urinalysis    Component  Value Date/Time   COLORURINE yellow 02/09/2008 1046   APPEARANCEUR Clear 02/09/2008 1046   LABSPEC 1.015 03/29/2009 1440   LABSPEC 1.015 02/09/2008 1046   PHURINE 7.5 02/09/2008 1046   HGBUR negative 02/09/2008 1046   BILIRUBINUR negative 02/09/2008 1046   UROBILINOGEN 0.2 02/09/2008 1046   NITRITE negative 02/09/2008 1046   Urine Drug Screen  No results found for this basename: labopia, cocainscrnur, labbenz, amphetmu, thcu, labbarb    Alcohol Level No results found for this basename: eth   CT of the brain  04/04/2012  No acute intracranial abnormalities.  MRI of the brain  04/04/2012 negative  MRA of the brain  04/04/2012 negative  2D Echocardiogram  ordered  Carotid Doppler  No internal carotid artery stenosis bilaterally. Vertebrals with antegrade flow bilaterally.   CXR  Not ordered   EKG  Sinus arrhythmia, PVC's noted, left anterior fascicular block.   Physical Exam   Awake alert. Afebrile. Head is nontraumatic. Neck is supple without bruit. Hearing is normal. Cardiac exam no murmur or gallop. Lungs are clear to auscultation. Distal pulses are well felt.   Neurological Exam Awake  Alert oriented x 3. Normal speech and language but slightly slow in her response. Mildly decreased recall 2/3..eye movements full without nystagmus. Face symmetric. Tongue midline. Normal strength, tone, reflexes and coordination. Normal sensation. Gait deferred.   ASSESSMENT [name] is a 78 y.o. female with recurrent transient language difficulties, not felt to be a TIA, though it is in the differential that also includes mild congitive impairment, migraine variant or stress. On aspirin 325 mg orally every day prior to admission. Now on aspirin 325 mg orally every day for secondary stroke prevention. Patient with not resultant deficits.  -recent stress due to moving -hyperlipidemia, intolerant to statins in the past  Hospital day # 1  TREATMENT/PLAN -Continue aspirin 325 mg orally every  day for secondary stroke prevention. -follow stroke workup -add Zetia -OOB -D/w husband , patient and answered questions.  Joaquin Music, ANP-BC, GNP-BC [facility] Pager: [phone] 04/05/2012 12:20 PM  Dr. Delia Heady, Stroke Center Medical Director, has personally reviewed chart, pertinent data, examined the patient and developed the plan of care.

## 2012-04-05 NOTE — Progress Notes (Signed)
Utilization review completed. [name] Barker 04/05/2012 

## 2012-04-05 NOTE — Discharge Instructions (Signed)
STROKE/TIA DISCHARGE INSTRUCTIONS SMOKING Cigarette smoking nearly doubles your risk of having a stroke & is the single most alterable risk factor  If you smoke or have smoked in the last 12 months, you are advised to quit smoking for your health.  Most of the excess cardiovascular risk related to smoking disappears within a year of stopping.  Ask you doctor about anti-smoking medications  [place] Quit Line: 1-800-QUIT NOW  Free Smoking Cessation Classes [phone]  CHOLESTEROL Know your levels; limit fat & cholesterol in your diet  Lipid Panel     Component Value Date/Time   CHOL 203* 04/05/2012 0540   TRIG 89 04/05/2012 0540   HDL 53 04/05/2012 0540   CHOLHDL 3.8 04/05/2012 0540   VLDL 18 04/05/2012 0540   LDLCALC 132* 04/05/2012 0540      Many patients benefit from treatment even if their cholesterol is at goal.  Goal: Total Cholesterol (CHOL) less than 160  Goal:  Triglycerides (TRIG) less than 150  Goal:  HDL greater than 40  Goal:  LDL (LDLCALC) less than 100   BLOOD PRESSURE American Stroke Association blood pressure target is less that 120/80 mm/Hg  Your discharge blood pressure is:  BP: 105/58 mmHg  Monitor your blood pressure  Limit your salt and alcohol intake  Many individuals will require more than one medication for high blood pressure  DIABETES (A1c is a blood sugar average for last 3 months) Goal HGBA1c is under 7% (HBGA1c is blood sugar average for last 3 months)  Diabetes: {STROKE DC DIABETES:22357}    Lab Results  Component Value Date   HGBA1C 5.8* 04/04/2012     Your HGBA1c can be lowered with medications, healthy diet, and exercise.  Check your blood sugar as directed by your physician  Call your physician if you experience unexplained or low blood sugars.  PHYSICAL ACTIVITY/REHABILITATION Goal is 30 minutes at least 4 days per week    {STROKE DC ACTIVITY/REHAB:22359}  Activity decreases your risk of heart attack and stroke and makes your heart stronger.   It helps control your weight and blood pressure; helps you relax and can improve your mood.  Participate in a regular exercise program.  Talk with your doctor about the best form of exercise for you (dancing, walking, swimming, cycling).  DIET/WEIGHT Goal is to maintain a healthy weight  Your discharge diet is: Cardiac *** liquids Your height is:  Height: 4\' 11"  (149.9 cm) Your current weight is: Weight: 55.339 kg (122 lb) Your Body Mass Index (BMI) is:  BMI (Calculated): 24.7   Following the type of diet specifically designed for you will help prevent another stroke.  Your goal weight range is:  ***  Your goal Body Mass Index (BMI) is 19-24.  Healthy food habits can help reduce 3 risk factors for stroke:  High cholesterol, hypertension, and excess weight.  RESOURCES Stroke/Support Group:  Call [phone]  they meet the 3rd Sunday of the month on the Rehab Unit at [facility] [place], [place] ( no meetings June, July & Aug).  STROKE EDUCATION PROVIDED/REVIEWED AND GIVEN TO PATIENT Stroke warning signs and symptoms How to activate emergency medical system (call 911). Medications prescribed at discharge. Need for follow-up after discharge. Personal risk factors for stroke. Pneumonia vaccine given:   {STROKE DC YES/NO/DATE:22363} Flu vaccine given:   {STROKE DC YES/NO/DATE:22363} My questions have been answered, the writing is legible, and I understand these instructions.  I will adhere to these goals & educational materials that have been provided  to me after my discharge from the hospital.

## 2012-04-05 NOTE — Progress Notes (Signed)
  Echocardiogram 2D Echocardiogram has been performed.  [name] Barker A 04/05/2012, 10:23 AM

## 2012-04-05 NOTE — Discharge Summary (Signed)
Patient ID: [name] Barker MRN: [id number] DOB/AGE: 76-Mar-1922 76 y.o. Primary Care Physician:SWORDS,BRUCE [name], MD, MD Admit date: 04/04/2012 Discharge date: 04/05/2012    Discharge Diagnoses:    TIA (transient ischemic attack) vs headache with neurological manifestations vs stress  Hx of  ADENOCARCINOMA, COLON s/p resection x2  HYPERLIPIDEMIA  CAD   Medication List  As of 04/05/2012  2:20 PM   START taking these medications         acetaminophen 500 MG tablet   Commonly known as: TYLENOL   Take 2 tablets (1,000 mg total) by mouth every 6 (six) hours as needed for pain.      aspirin 325 MG tablet   Take 1 tablet (325 mg total) by mouth daily.      ezetimibe 10 MG tablet   Commonly known as: ZETIA   Take 1 tablet (10 mg total) by mouth daily.         CONTINUE taking these medications         albuterol 108 (90 BASE) MCG/ACT inhaler   Commonly known as: PROVENTIL HFA;VENTOLIN HFA   Inhale 2 puffs into the lungs 2 (two) times daily as needed.      ALIGN 4 MG Caps      CALCIUM CITRATE + PO      diclofenac sodium 1 % Gel   Commonly known as: VOLTAREN      docusate sodium 100 MG capsule   Commonly known as: COLACE      Fish Oil 1200 MG Caps      HYDROcodone-acetaminophen 7.5-500 MG/15ML solution   Commonly known as: LORTAB   Take 1 tsp by mouth 3 times a day as needed for pain.      lansoprazole 15 MG capsule   Commonly known as: PREVACID      loratadine 10 MG tablet   Commonly known as: CLARITIN      METAMUCIL 30.9 % Powd   Generic drug: Psyllium      multivitamin tablet      Vitamin D 2000 UNITS tablet         STOP taking these medications         glycerin adult 2 G Supp      ibuprofen 200 MG tablet          Where to get your medications    These are the prescriptions that you need to pick up.   You may get these medications from any pharmacy.         acetaminophen 500 MG tablet   aspirin 325 MG tablet   ezetimibe 10 MG tablet            Discharged Condition: good. Neurologically intact       Consults:Neurology  Significant Diagnostic Studies: Ct Head Wo Contrast  04/04/2012  *RADIOLOGY REPORT*  Clinical Data: Code stroke  CT HEAD WITHOUT CONTRAST  Technique:  Contiguous axial images were obtained from the base of the skull through the vertex without contrast.  Comparison: None.  Findings: There is diffuse patchy low density throughout the subcortical and periventricular white matter consistent with chronic small vessel ischemic change.  There is prominence of the sulci and ventricles consistent with brain atrophy.  There is no evidence for acute brain infarct, hemorrhage or mass.  The paranasal sinuses and mastoid air cells are clear.  The skull is intact.  IMPRESSION:  1.  No acute intracranial abnormalities.  Original Report Authenticated By: Rosealee Albee, M.D.   Mr Angiogram  Head Wo Contrast  04/04/2012  *RADIOLOGY REPORT*  Clinical Data:  Expressive aphasia.  Rule out CVA.  Colon cancer.  MRI HEAD WITHOUT CONTRAST MRA HEAD WITHOUT CONTRAST  Technique:  Multiplanar, multiecho pulse sequences of the brain and surrounding structures were obtained without intravenous contrast. Angiographic images of the head were obtained using MRA technique without contrast.  Comparison:  CT 04/04/2012  MRI HEAD  Findings:  Negative for acute infarct.  Generalized atrophy. Chronic microvascular ischemia in the cerebral white matter of a mild degree.  Basal ganglia and brainstem are normal.  Negative for intracranial hemorrhage.  No infarct or mass.  Paranasal sinuses are clear.  IMPRESSION: Chronic microvascular ischemia.  No acute infarct or mass.  MRA HEAD  Findings: Both vertebral arteries are patent to the basilar.  Left vertebral artery is dominant.  PICA, superior cerebellar, and posterior cerebral arteries are patent bilaterally.  Fetal origin of the left posterior cerebral artery with hypoplastic left P1 segment.  This is a normal  variation.  Internal carotid artery is patent bilaterally without stenosis. Anterior and middle cerebral arteries are patent bilaterally. Negative for cerebral aneurysm.  IMPRESSION: Negative  Original Report Authenticated By: Camelia Phenes, M.D.   Mr Brain Wo Contrast  04/04/2012  *RADIOLOGY REPORT*  Clinical Data:  Expressive aphasia.  Rule out CVA.  Colon cancer.  MRI HEAD WITHOUT CONTRAST MRA HEAD WITHOUT CONTRAST  Technique:  Multiplanar, multiecho pulse sequences of the brain and surrounding structures were obtained without intravenous contrast. Angiographic images of the head were obtained using MRA technique without contrast.  Comparison:  CT 04/04/2012  MRI HEAD  Findings:  Negative for acute infarct.  Generalized atrophy. Chronic microvascular ischemia in the cerebral white matter of a mild degree.  Basal ganglia and brainstem are normal.  Negative for intracranial hemorrhage.  No infarct or mass.  Paranasal sinuses are clear.  IMPRESSION: Chronic microvascular ischemia.  No acute infarct or mass.  MRA HEAD  Findings: Both vertebral arteries are patent to the basilar.  Left vertebral artery is dominant.  PICA, superior cerebellar, and posterior cerebral arteries are patent bilaterally.  Fetal origin of the left posterior cerebral artery with hypoplastic left P1 segment.  This is a normal variation.  Internal carotid artery is patent bilaterally without stenosis. Anterior and middle cerebral arteries are patent bilaterally. Negative for cerebral aneurysm.  IMPRESSION: Negative  Original Report Authenticated By: Camelia Phenes, M.D.   2D echo Study Conclusions  - Left ventricle: The cavity size was normal. Wall thickness was normal. Systolic function was normal. The estimated ejection fraction was in the range of 55% to 60%. - Aortic valve: Mild regurgitation. - Mitral valve: Mild regurgitation. - Left atrium: The atrium was mildly dilated. - Atrial septum: No defect or patent foramen ovale  was identified.   Carotid dopplers - without ICA stenoses   Lab Results: Results for orders placed during the hospital encounter of 04/04/12 (from the past 48 hour(s))  PROTIME-INR     Status: Normal   Collection Time   04/04/12  1:42 PM      Component Value Range Comment   Prothrombin Time 13.4  11.6 - 15.2 (seconds)    INR 1.00  0.00 - 1.49    APTT     Status: Normal   Collection Time   04/04/12  1:42 PM      Component Value Range Comment   aPTT 30  24 - 37 (seconds)   CBC     Status:  Normal   Collection Time   04/04/12  1:42 PM      Component Value Range Comment   WBC 7.7  4.0 - 10.5 (K/uL)    RBC 4.14  3.87 - 5.11 (MIL/uL)    Hemoglobin 12.1  12.0 - 15.0 (g/dL)    HCT 45.4  09.8 - 11.9 (%)    MCV 88.9  78.0 - 100.0 (fL)    MCH 29.2  26.0 - 34.0 (pg)    MCHC 32.9  30.0 - 36.0 (g/dL)    RDW 14.7  82.9 - 56.2 (%)    Platelets 267  150 - 400 (K/uL)   DIFFERENTIAL     Status: Normal   Collection Time   04/04/12  1:42 PM      Component Value Range Comment   Neutrophils Relative 60  43 - 77 (%)    Neutro Abs 4.6  1.7 - 7.7 (K/uL)    Lymphocytes Relative 32  12 - 46 (%)    Lymphs Abs 2.4  0.7 - 4.0 (K/uL)    Monocytes Relative 6  3 - 12 (%)    Monocytes Absolute 0.5  0.1 - 1.0 (K/uL)    Eosinophils Relative 2  0 - 5 (%)    Eosinophils Absolute 0.1  0.0 - 0.7 (K/uL)    Basophils Relative 0  0 - 1 (%)    Basophils Absolute 0.0  0.0 - 0.1 (K/uL)   COMPREHENSIVE METABOLIC PANEL     Status: Abnormal   Collection Time   04/04/12  1:42 PM      Component Value Range Comment   Sodium 135  135 - 145 (mEq/L)    Potassium 3.6  3.5 - 5.1 (mEq/L)    Chloride 99  96 - 112 (mEq/L)    CO2 28  19 - 32 (mEq/L)    Glucose, Bld 118 (*) 70 - 99 (mg/dL)    BUN 11  6 - 23 (mg/dL)    Creatinine, Ser 1.30  0.50 - 1.10 (mg/dL)    Calcium 9.4  8.4 - 10.5 (mg/dL)    Total Protein 6.7  6.0 - 8.3 (g/dL)    Albumin 3.6  3.5 - 5.2 (g/dL)    AST 30  0 - 37 (U/L)    ALT 14  0 - 35 (U/L)    Alkaline  Phosphatase 55  39 - 117 (U/L)    Total Bilirubin 0.3  0.3 - 1.2 (mg/dL)    GFR calc non Af Amer 79 (*) >90 (mL/min)    GFR calc Af Amer >90  >90 (mL/min)   HEMOGLOBIN A1C     Status: Abnormal   Collection Time   04/04/12  1:42 PM      Component Value Range Comment   Hemoglobin A1C 5.8 (*) <5.7 (%)    Mean Plasma Glucose 120 (*) <117 (mg/dL)   CK TOTAL AND CKMB     Status: Abnormal   Collection Time   04/04/12  1:43 PM      Component Value Range Comment   Total CK 112  7 - 177 (U/L)    CK, MB 3.1  0.3 - 4.0 (ng/mL)    Relative Index 2.8 (*) 0.0 - 2.5    TROPONIN I     Status: Normal   Collection Time   04/04/12  1:43 PM      Component Value Range Comment   Troponin I <0.30  <0.30 (ng/mL)   LIPID PANEL     Status:  Abnormal   Collection Time   04/05/12  5:40 AM      Component Value Range Comment   Cholesterol 203 (*) 0 - 200 (mg/dL)    Triglycerides 89  <161 (mg/dL)    HDL 53  >09 (mg/dL)    Total CHOL/HDL Ratio 3.8      VLDL 18  0 - 40 (mg/dL)    LDL Cholesterol 604 (*) 0 - 99 (mg/dL)    No results found for this or any previous visit (from the past 240 hour(s)).   Hospital Course:   78 yo woman with very little pmhx, was placed under observation from the Ed after an episode of transient loss of words. She says that the words were in her brain but they would not come out of her mouth. She also stated that at the time of the event she was getting interviewed by an intimidating gentleman in an expensive suit.  She presented to the ED as code stroke but the MRI failed to identify any stroke.  She was placed under observation for possible TIA. She was continue don aspirin. Workup was negative for atherosclerosis, diabetes or HTN. She was found to have a slightly elevated LDL and Dr. Pearlean Brownie recommended Zetia. It is debatable if the patient truly had a TIA or it was more a stress reaction. She was DC ed in good condition neurologically intact    Discharge Exam: Blood pressure 110/41,  pulse 59, temperature 98.1 F (36.7 C), temperature source Oral, resp. rate 20, height 4\' 11"  (1.499 m), weight 55.339 kg (122 lb), SpO2 96.00%.  Alert and oriented x3 CVS: RRR RS: CTAB   Disposition: home   Discharge Orders    Future Appointments: Provider: Department: Dept Phone: Center:   08/25/2012 8:45 AM Lindley Magnus, MD [facility] [phone] [facility]      Follow-up Information    Schedule an appointment as soon as possible for a visit with Judie Petit, MD.   Contact information:   [address] [place] [address] [phone]          Signed: Lonia Blood 04/05/2012, 2:20 PM

## 2012-04-05 NOTE — Progress Notes (Signed)
*  PRELIMINARY RESULTS* Vascular Ultrasound Carotid Duplex (Doppler) has been completed.  Preliminary findings: Bilaterally no significant ICA stenosis with antegrade vertebral flow.  Farrel Demark RDMS 04/05/2012, 11:57 AM

## 2012-04-07 ENCOUNTER — Telehealth: Payer: Self-pay | Admitting: Internal Medicine

## 2012-04-07 NOTE — Telephone Encounter (Signed)
Have her see me 8/16 at 8:15 a.m.

## 2012-04-07 NOTE — Telephone Encounter (Signed)
Pt needs to be seen for a hospital follow up ASAP when can patient be worked in?

## 2012-04-08 ENCOUNTER — Telehealth: Payer: Self-pay | Admitting: *Deleted

## 2012-04-08 NOTE — Telephone Encounter (Signed)
Husband calls stating pt is having severe vomiting and she lives in a home that is having an epidemic.  Most have been to the ER, so he is going to take her to get IVs and nausea RX.

## 2012-04-12 NOTE — Telephone Encounter (Signed)
Dr. Swords made aware. 

## 2012-04-13 ENCOUNTER — Encounter: Payer: Self-pay | Admitting: Internal Medicine

## 2012-04-13 ENCOUNTER — Ambulatory Visit (INDEPENDENT_AMBULATORY_CARE_PROVIDER_SITE_OTHER): Payer: Medicare Other | Admitting: Internal Medicine

## 2012-04-13 VITALS — BP 116/70 | HR 72 | Temp 98.1°F | Wt 132.0 lb

## 2012-04-13 DIAGNOSIS — R197 Diarrhea, unspecified: Secondary | ICD-10-CM

## 2012-04-13 DIAGNOSIS — G459 Transient cerebral ischemic attack, unspecified: Secondary | ICD-10-CM

## 2012-04-13 MED ORDER — LOPERAMIDE HCL 2 MG PO TABS: 2.0000 mg | ORAL_TABLET | Freq: Four times a day (QID) | ORAL | Status: AC | PRN

## 2012-04-13 NOTE — Progress Notes (Signed)
Patient ID: [name] Barker, female   DOB: June 20, 1921, 78 y.o.   MRN: [id number] Post hospital follow up. Presumed TIA--see MRI, and echo She is feeling well, no recurrent neurologic events.   She has had some intermittent diarrhea, even prior to hospitalization--note hx of colon CA  Lipids---recommended Zetia at hospital discharge  Past Medical History  Diagnosis Date  . ADENOCARCINOMA, COLON 08/19/2007  . ANEMIA-NOS 11/17/2006  . CAD 06/21/2010  . DIVERTICULOSIS-COLON 05/30/2010  . GERD 11/17/2006  . HYPERLIPIDEMIA 11/17/2006  . LOW BACK PAIN, CHRONIC 02/04/2010  . OSTEOPENIA 11/17/2006    History   Social History  . Marital Status: Married    Spouse Barker: N/A    Number of Children: N/A  . Years of Education: N/A   Occupational History  . Not on file.   Social History Main Topics  . Smoking status: Never Smoker   . Smokeless tobacco: Never Used  . Alcohol Use: No  . Drug Use: No  . Sexually Active: Not on file     not asked   Other Topics Concern  . Not on file   Social History Narrative  . No narrative on file    Past Surgical History  Procedure Date  . Appendectomy   . Back surgery   . Colon surgery   . Abdominal hysterectomy   . Joint replacement     No family history on file.  Allergies  Allergen Reactions  . Methenamine Mandelate     REACTION: burning sensation  . Morphine Sulfate     REACTION: blister  . Penicillins     REACTION: urticaria (hives)  . Promethazine Hcl     REACTION: hallucinations  . Statins Other (See Comments)    Muscle pain     Current Outpatient Prescriptions on File Prior to Visit  Medication Sig Dispense Refill  . acetaminophen (TYLENOL) 500 MG tablet Take 2 tablets (1,000 mg total) by mouth every 6 (six) hours as needed for pain.  30 tablet  0  . albuterol (PROAIR HFA) 108 (90 BASE) MCG/ACT inhaler Inhale 2 puffs into the lungs 2 (two) times daily as needed.  8.5 g  5  . aspirin 325 MG tablet Take 1 tablet (325 mg  total) by mouth daily.  30 tablet  0  . Calcium Citrate-Vitamin D (CALCIUM CITRATE + PO) Take 600 mg by mouth 2 (two) times daily.       . Cholecalciferol (VITAMIN D) 2000 UNITS tablet Take 2,000 Units by mouth daily.        Marland Kitchen docusate sodium (COLACE) 100 MG capsule Take 200 mg by mouth daily.       . ferrous sulfate 324 (65 FE) MG TBEC Take 1 tablet by mouth daily.      Marland Kitchen HYDROcodone-acetaminophen (LORTAB) 7.5-500 MG/15ML solution Take 1 tsp by mouth 3 times a day as needed for pain.  240 mL  2  . lansoprazole (PREVACID) 15 MG capsule Take 15 mg by mouth daily.        Marland Kitchen loratadine (CLARITIN) 10 MG tablet Take 10 mg by mouth daily.        . Multiple Vitamin (MULTIVITAMIN) tablet Take 1 tablet by mouth daily.        . Omega-3 Fatty Acids (FISH OIL) 1200 MG CAPS Take by mouth 2 (two) times daily.       . Probiotic Product (ALIGN) 4 MG CAPS Take by mouth daily.           patient  denies chest pain, shortness of breath, orthopnea. Denies lower extremity edema, abdominal pain, change in appetite, change in bowel movements. Patient denies rashes, musculoskeletal complaints. No other specific complaints in a complete review of systems.   BP 116/70  Pulse 72  Temp(Src) 98.1 F (36.7 C) (Oral)  Wt 132 lb (59.875 kg) Elderly, well-nourished female in no acute distress. HEENT exam atraumatic, normocephalic, extraocular muscles are intact. Neck is supple. No jugular venous distention no thyromegaly. Chest clear to auscultation without increased work of breathing. Cardiac exam S1 and S2 are regular. Abdominal exam active bowel sounds, soft, nontender. Extremities no edema. Neurologic exam she is alert.   A/P:  Diarrhea: no concerning sxs- trial OTC imodium

## 2012-04-13 NOTE — Assessment & Plan Note (Signed)
Symptoms have resolved Continue ASA 325.

## 2012-04-20 DIAGNOSIS — N390 Urinary tract infection, site not specified: Secondary | ICD-10-CM | POA: Diagnosis not present

## 2012-04-20 DIAGNOSIS — L918 Other hypertrophic disorders of the skin: Secondary | ICD-10-CM | POA: Diagnosis not present

## 2012-05-06 ENCOUNTER — Encounter: Payer: Self-pay | Admitting: *Deleted

## 2012-05-26 ENCOUNTER — Ambulatory Visit (INDEPENDENT_AMBULATORY_CARE_PROVIDER_SITE_OTHER): Payer: Medicare Other | Admitting: Internal Medicine

## 2012-05-26 ENCOUNTER — Encounter: Payer: Self-pay | Admitting: Internal Medicine

## 2012-05-26 VITALS — BP 120/80 | Temp 97.9°F | Wt 133.0 lb

## 2012-05-26 DIAGNOSIS — M545 Low back pain, unspecified: Secondary | ICD-10-CM | POA: Diagnosis not present

## 2012-05-26 MED ORDER — HYDROCODONE-ACETAMINOPHEN 5-500 MG PO TABS: 1.0000 | ORAL_TABLET | Freq: Four times a day (QID) | ORAL | Status: DC | PRN

## 2012-05-26 NOTE — Patient Instructions (Signed)
You  may move around, but avoid painful motions and activities.  Apply heat  to the sore area for 15 to 20 minutes 3 or 4 times daily for the next two to 3 days.  Call or return to clinic prn if these symptoms worsen or fail to improve as anticipated.  

## 2012-05-26 NOTE — Progress Notes (Signed)
  Subjective:    Patient ID: [name] Barker, female    DOB: [birth year], 78 y.o.   MRN: [id number]  HPI   78 year old patient who has a history of chronic low back pain osteoarthritis. She is status post left total knee replacement surgery in the past.  For the past 2 days she has had increasing pain in the left lumbar area and especially the left upper medial thigh. She has a very difficult time transferring added the car with increasing pain with abduction of the hip. No falls or trauma. She does have a prescription for hydrocodone but she takes this very infrequently do to be sedating and constipating effects;  the hydrocodone dose is 7.5    Review of Systems  Musculoskeletal: Positive for back pain.       Objective:   Physical Exam  Constitutional: She is oriented to person, place, and time. She appears well-developed and well-nourished. No distress.  HENT:  Head: Normocephalic.  Right Ear: External ear normal.  Left Ear: External ear normal.  Eyes: Conjunctivae and EOM are normal. Pupils are equal, round, and reactive to light.  Neck: Normal range of motion. Neck supple. No thyromegaly present.  Cardiovascular: Normal rate and regular rhythm.   Abdominal: She exhibits no mass. There is no tenderness.  Musculoskeletal:       There is mild tenderness over the left lumbar back to palpation; external rotation left hip did aggravate the left groin discomfort The patient was able to stand from a sitting position and walk across the room without difficulty  Lymphadenopathy:    She has no cervical adenopathy.  Neurological: She is alert and oriented to person, place, and time.  Skin: Skin is warm and dry. No rash noted.  Psychiatric: She has a normal mood and affect. Her behavior is normal.          Assessment & Plan:   Back and left groin pain of 2 days' duration. Suspect more musculoligamentous. Will continue ibuprofen. We'll refill hydrocodone at a lower strength. We'll  apply heat to the area and continue Voltaren gel

## 2012-06-03 ENCOUNTER — Other Ambulatory Visit: Payer: Self-pay | Admitting: Obstetrics and Gynecology

## 2012-06-03 DIAGNOSIS — A58 Granuloma inguinale: Secondary | ICD-10-CM | POA: Diagnosis not present

## 2012-06-03 DIAGNOSIS — L918 Other hypertrophic disorders of the skin: Secondary | ICD-10-CM | POA: Diagnosis not present

## 2012-06-04 DIAGNOSIS — L820 Inflamed seborrheic keratosis: Secondary | ICD-10-CM | POA: Diagnosis not present

## 2012-06-04 DIAGNOSIS — D239 Other benign neoplasm of skin, unspecified: Secondary | ICD-10-CM | POA: Diagnosis not present

## 2012-06-04 DIAGNOSIS — L821 Other seborrheic keratosis: Secondary | ICD-10-CM | POA: Diagnosis not present

## 2012-06-04 DIAGNOSIS — L298 Other pruritus: Secondary | ICD-10-CM | POA: Diagnosis not present

## 2012-06-08 ENCOUNTER — Other Ambulatory Visit: Payer: Self-pay | Admitting: Internal Medicine

## 2012-06-08 DIAGNOSIS — Z1231 Encounter for screening mammogram for malignant neoplasm of breast: Secondary | ICD-10-CM

## 2012-06-15 ENCOUNTER — Other Ambulatory Visit: Payer: Self-pay | Admitting: *Deleted

## 2012-06-15 MED ORDER — HYDROCODONE-ACETAMINOPHEN 5-500 MG PO TABS: 1.0000 | ORAL_TABLET | Freq: Four times a day (QID) | ORAL | Status: DC | PRN

## 2012-06-25 ENCOUNTER — Ambulatory Visit (INDEPENDENT_AMBULATORY_CARE_PROVIDER_SITE_OTHER): Payer: Medicare Other | Admitting: Gastroenterology

## 2012-06-25 ENCOUNTER — Encounter: Payer: Self-pay | Admitting: Gastroenterology

## 2012-06-25 ENCOUNTER — Other Ambulatory Visit (INDEPENDENT_AMBULATORY_CARE_PROVIDER_SITE_OTHER): Payer: Medicare Other

## 2012-06-25 VITALS — BP 80/60 | HR 64 | Ht 59.0 in | Wt 130.4 lb

## 2012-06-25 DIAGNOSIS — Z85038 Personal history of other malignant neoplasm of large intestine: Secondary | ICD-10-CM | POA: Diagnosis not present

## 2012-06-25 DIAGNOSIS — L918 Other hypertrophic disorders of the skin: Secondary | ICD-10-CM

## 2012-06-25 DIAGNOSIS — N898 Other specified noninflammatory disorders of vagina: Secondary | ICD-10-CM

## 2012-06-25 LAB — BASIC METABOLIC PANEL
BUN: 13 mg/dL (ref 6–23)
CO2: 28 mEq/L (ref 19–32)
Calcium: 9.2 mg/dL (ref 8.4–10.5)
Chloride: 103 mEq/L (ref 96–112)
Creatinine, Ser: 0.8 mg/dL (ref 0.4–1.2)
GFR: 77.31 mL/min (ref >60.00)
Glucose, Bld: 85 mg/dL (ref 70–99)
Potassium: 4.3 mEq/L (ref 3.5–5.1)
Sodium: 139 mEq/L (ref 135–145)

## 2012-06-25 MED ORDER — MOVIPREP 100 G PO SOLR: 1.0000 | Freq: Once | ORAL | Status: DC

## 2012-06-25 NOTE — Patient Instructions (Addendum)
Your physician has requested that you go to the basement for the following lab work before leaving today: bmet.  You have been scheduled for a CT scan of the abdomen and pelvis at [facility] ([address]---this is in the same building as [facility]).   You are scheduled on 06/30/12 at 10:00am. You should arrive 15 minutes prior to your appointment time for registration. Please follow the written instructions below on the day of your exam:  WARNING: IF YOU ARE ALLERGIC TO IODINE/X-RAY DYE, PLEASE NOTIFY RADIOLOGY IMMEDIATELY AT [phone]! YOU WILL BE GIVEN A 13 HOUR PREMEDICATION PREP.  1) Do not eat or drink anything after 6:00am (4 hours prior to your test)  You may take any medications as prescribed with a small amount of water except for the following: Metformin, Glucophage, Glucovance, Avandamet, Riomet, Fortamet, Actoplus Met, Janumet, Glumetza or Metaglip. The above medications must be held the day of the exam AND 48 hours after the exam.  The purpose of you drinking the oral contrast is to aid in the visualization of your intestinal tract. The contrast solution may cause some diarrhea. Before your exam is started, you will be given a small amount of fluid to drink. Depending on your individual set of symptoms, you may also receive an intravenous injection of x-ray contrast/dye. Plan on being at [facility] for 30 minutes or long, depending on the type of exam you are having performed.  If you have any questions regarding your exam or if you need to reschedule, you may call the CT department at [phone] between the hours of 8:00 am and 5:00 pm, Monday-Friday.  ________________________________________________________________________  [name] Barker have been scheduled for a colonoscopy with propofol. Please follow written instructions given to you at your visit today.  Please pick up your prep kit at the pharmacy within the next 1-3 days.  cc: Birdie Sons, MD     Marcelle Overlie, MD

## 2012-06-25 NOTE — Progress Notes (Addendum)
History of Present Illness: This is an 78 year old female here today with her husband. She has a history of colon cancer in 1994 with a recurrence in 2008. She is followed by Dr. Vincente Poli for a 2 year history of recurrent vaginal bleeding and recurrent granulation tissue at her vaginal cuff. An abdominal and pelvic CT performed last summer did not reveal any significant gastrointestinal or intra-abdominal pathology. No fistula was noted. She is referred to me to consider colonoscopy for further evaluation. Colonoscopy in April 2009 revealed a tortuous colon with a moderately difficult insertion secondary to suspected adhesions, prior right hemicolectomy, diverticulosis and internal hemorrhoids and recurrent malignancy. She had bradycardia during the procedure.  She underwent CT colonoscopy which was a very limited study in June 2011. She has occasional constipation treated with MiraLax. She notes lower abdominal cramping and discomfort with internal vegetables.  Denies weight loss, abdominal pain, diarrhea, change in stool caliber, melena, hematochezia, nausea, vomiting, dysphagia, reflux symptoms, chest pain.  Allergies  Allergen Reactions  . Methenamine Mandelate     REACTION: burning sensation  . Morphine Sulfate     REACTION: blister  . Penicillins     REACTION: urticaria (hives)  . Promethazine Hcl     REACTION: hallucinations  . Statins Other (See Comments)    Muscle pain    Outpatient Prescriptions Prior to Visit  Medication Sig Dispense Refill  . albuterol (PROAIR HFA) 108 (90 BASE) MCG/ACT inhaler Inhale 2 puffs into the lungs 2 (two) times daily as needed.  8.5 g  5  . Calcium Citrate-Vitamin D (CALCIUM CITRATE + PO) Take 600 mg by mouth 2 (two) times daily.       . Cholecalciferol (VITAMIN D) 2000 UNITS tablet Take 2,000 Units by mouth daily.        . diclofenac sodium (VOLTAREN) 1 % GEL Apply 1 application topically 3 (three) times daily. Back pain      . docusate sodium (COLACE) 100  MG capsule Take 200 mg by mouth daily.       Marland Kitchen HYDROcodone-acetaminophen (VICODIN) 5-500 MG per tablet Take 1 tablet by mouth every 6 (six) hours as needed for pain.  60 tablet  3  . ibuprofen (ADVIL,MOTRIN) 200 MG tablet Take 400 mg by mouth daily.       . lansoprazole (PREVACID) 15 MG capsule Take 15 mg by mouth daily.        . Multiple Vitamin (MULTIVITAMIN) tablet Take 1 tablet by mouth daily.        . Omega-3 Fatty Acids (FISH OIL) 1200 MG CAPS Take by mouth 2 (two) times daily.       . Probiotic Product (ALIGN) 4 MG CAPS Take by mouth daily.        Marland Kitchen aspirin 81 MG tablet 2 tab qday      . hydrocortisone cream 1 % Apply 1 application topically 2 (two) times daily as needed. To back      . sodium chloride (MURO 128) 2 % ophthalmic solution Place 1 drop into both eyes daily.       Past Medical History  Diagnosis Date  . Colon cancer 08/19/2007    1994 T2, 2008 T3, N1  . ANEMIA-NOS 11/17/2006  . CAD 06/21/2010  . DIVERTICULOSIS-COLON 05/30/2010  . GERD 11/17/2006  . HYPERLIPIDEMIA 11/17/2006  . LOW BACK PAIN, CHRONIC 02/04/2010  . Osteopenia 11/17/2006  . Allergic rhinitis   . Small bowel obstruction   . Hemorrhoids   . TIA (transient ischemic attack)  Past Surgical History  Procedure Date  . Appendectomy   . Back surgery 2007  . Hemicolectomy 1994    Right  . Abdominal hysterectomy   . Joint replacement   . Shoulder surgery   . Knee arthroscopy   . Partial colectomy 2008  . Rotator cuff repair   . Vaginal prolapse repair   . Cataract extraction   . Belpharoptosis repair    History   Social History  . Marital Status: Married    Spouse Name: N/A    Number of Children: 6  . Years of Education: N/A   Social History Main Topics  . Smoking status: Never Smoker   . Smokeless tobacco: Never Used  . Alcohol Use: No  . Drug Use: No  . Sexually Active: None     not asked   Other Topics Concern  . None   Social History Narrative  . None   Family History  Problem  Relation Age of Onset  . Lung cancer Sister   . Colon cancer Sister 5  . Kidney cancer Sister    Review of Systems: Pertinent positive and negative review of systems were noted in the above HPI section. All other review of systems were otherwise negative.  Physical Exam: General: Well developed , well nourished, elderly white female, no acute distress Head: Normocephalic and atraumatic Eyes:  sclerae anicteric, EOMI Ears: Normal auditory acuity Mouth: No deformity or lesions Neck: Supple, no masses or thyromegaly Lungs: Clear throughout to auscultation Heart: Regular rate and rhythm; no murmurs, rubs or bruits Abdomen: Soft, non tender and non distended. No masses, hepatosplenomegaly or hernias noted. Normal Bowel sounds Rectal: deferred to colonoscopy Musculoskeletal: Symmetrical with no gross deformities  Skin: No lesions on visible extremities Pulses:  Normal pulses noted Extremities: No clubbing, cyanosis, edema or deformities noted Neurological: Alert oriented x 4, grossly nonfocal Cervical Nodes:  No significant cervical adenopathy Inguinal Nodes: No significant inguinal adenopathy Psychological:  Alert and cooperative. Normal mood and affect  Assessment and Recommendations:  1. History of colon cancer in 1994 with a recurrence in 2008. She was not felt to be a good candidate for routine surveillance colonoscopies due to her tortuous colon, bradycardia during her prior colonoscopy and advanced age. She had a limited CT colonoscopy in 2011.  2. Recurrent vaginal cuff granulation tissue. There is concern for possible intra-abdominal malignancy or a fistula. This was not noted on CT scan last summer. Proceed with a CT enterography. She understands that she is at higher risk of complications for colonoscopy but she is willing to proceed to further evaluate for fistulas and recurrent colon cancer. The risks, benefits, and alternatives to colonoscopy with possible biopsy and possible  polypectomy were discussed with the patient and they consent to proceed.

## 2012-06-30 ENCOUNTER — Ambulatory Visit (INDEPENDENT_AMBULATORY_CARE_PROVIDER_SITE_OTHER)
Admission: RE | Admit: 2012-06-30 | Discharge: 2012-06-30 | Disposition: A | Discharge status: Home / Self Care | Payer: Medicare Other | Source: Ambulatory Visit | Attending: Gastroenterology | Admitting: Gastroenterology

## 2012-06-30 DIAGNOSIS — Z85038 Personal history of other malignant neoplasm of large intestine: Secondary | ICD-10-CM | POA: Diagnosis not present

## 2012-06-30 DIAGNOSIS — N898 Other specified noninflammatory disorders of vagina: Secondary | ICD-10-CM | POA: Diagnosis not present

## 2012-06-30 DIAGNOSIS — K449 Diaphragmatic hernia without obstruction or gangrene: Secondary | ICD-10-CM | POA: Diagnosis not present

## 2012-06-30 DIAGNOSIS — L918 Other hypertrophic disorders of the skin: Secondary | ICD-10-CM

## 2012-06-30 MED ORDER — IOHEXOL 300 MG/ML  SOLN
100.0000 mL | Freq: Once | INTRAMUSCULAR | Status: AC | PRN
  Administered 2012-06-30: 100 mL via INTRAVENOUS

## 2012-07-02 ENCOUNTER — Telehealth: Payer: Self-pay | Admitting: Gastroenterology

## 2012-07-02 DIAGNOSIS — N823 Fistula of vagina to large intestine: Secondary | ICD-10-CM

## 2012-07-02 NOTE — Telephone Encounter (Signed)
Left message for patient to call back  

## 2012-07-05 ENCOUNTER — Telehealth: Payer: Self-pay | Admitting: Gastroenterology

## 2012-07-05 NOTE — Telephone Encounter (Signed)
Duplicate telephone call.

## 2012-07-05 NOTE — Telephone Encounter (Signed)
Patient advised.  CT scheduled for 07/08/12 2:00.

## 2012-07-08 ENCOUNTER — Ambulatory Visit (INDEPENDENT_AMBULATORY_CARE_PROVIDER_SITE_OTHER)
Admission: RE | Admit: 2012-07-08 | Discharge: 2012-07-08 | Disposition: A | Discharge status: Home / Self Care | Payer: Medicare Other | Source: Ambulatory Visit | Attending: Gastroenterology | Admitting: Gastroenterology

## 2012-07-08 DIAGNOSIS — N898 Other specified noninflammatory disorders of vagina: Secondary | ICD-10-CM | POA: Diagnosis not present

## 2012-07-08 DIAGNOSIS — N824 Other female intestinal-genital tract fistulae: Secondary | ICD-10-CM | POA: Diagnosis not present

## 2012-07-08 DIAGNOSIS — K5730 Diverticulosis of large intestine without perforation or abscess without bleeding: Secondary | ICD-10-CM | POA: Diagnosis not present

## 2012-07-08 DIAGNOSIS — N823 Fistula of vagina to large intestine: Secondary | ICD-10-CM

## 2012-07-08 MED ORDER — IOHEXOL 300 MG/ML  SOLN
100.0000 mL | Freq: Once | INTRAMUSCULAR | Status: AC | PRN
  Administered 2012-07-08: 100 mL via INTRAVENOUS

## 2012-07-12 DIAGNOSIS — R3915 Urgency of urination: Secondary | ICD-10-CM | POA: Diagnosis not present

## 2012-07-12 DIAGNOSIS — R310 Gross hematuria: Secondary | ICD-10-CM | POA: Diagnosis not present

## 2012-07-12 DIAGNOSIS — R350 Frequency of micturition: Secondary | ICD-10-CM | POA: Diagnosis not present

## 2012-07-12 DIAGNOSIS — N3020 Other chronic cystitis without hematuria: Secondary | ICD-10-CM | POA: Diagnosis not present

## 2012-07-16 ENCOUNTER — Ambulatory Visit
Admission: RE | Admit: 2012-07-16 | Discharge: 2012-07-16 | Disposition: A | Discharge status: Home / Self Care | Payer: Medicare Other | Source: Ambulatory Visit | Attending: Internal Medicine | Admitting: Internal Medicine

## 2012-07-16 DIAGNOSIS — Z1231 Encounter for screening mammogram for malignant neoplasm of breast: Secondary | ICD-10-CM

## 2012-07-19 DIAGNOSIS — L918 Other hypertrophic disorders of the skin: Secondary | ICD-10-CM | POA: Diagnosis not present

## 2012-07-30 ENCOUNTER — Encounter: Payer: Medicare Other | Admitting: Gastroenterology

## 2012-08-11 DIAGNOSIS — R310 Gross hematuria: Secondary | ICD-10-CM | POA: Diagnosis not present

## 2012-08-11 DIAGNOSIS — N3020 Other chronic cystitis without hematuria: Secondary | ICD-10-CM | POA: Diagnosis not present

## 2012-08-25 ENCOUNTER — Ambulatory Visit: Payer: Medicare Other | Admitting: Internal Medicine

## 2012-09-24 DIAGNOSIS — Z23 Encounter for immunization: Secondary | ICD-10-CM | POA: Diagnosis not present

## 2012-10-13 ENCOUNTER — Ambulatory Visit: Payer: Self-pay | Admitting: Internal Medicine

## 2012-10-15 DIAGNOSIS — M5106 Intervertebral disc disorders with myelopathy, lumbar region: Secondary | ICD-10-CM | POA: Diagnosis not present

## 2012-10-15 DIAGNOSIS — M999 Biomechanical lesion, unspecified: Secondary | ICD-10-CM | POA: Diagnosis not present

## 2012-10-15 DIAGNOSIS — M5137 Other intervertebral disc degeneration, lumbosacral region: Secondary | ICD-10-CM | POA: Diagnosis not present

## 2012-10-15 DIAGNOSIS — M5430 Sciatica, unspecified side: Secondary | ICD-10-CM | POA: Diagnosis not present

## 2012-10-26 ENCOUNTER — Encounter: Payer: Self-pay | Admitting: Internal Medicine

## 2012-10-26 ENCOUNTER — Ambulatory Visit (INDEPENDENT_AMBULATORY_CARE_PROVIDER_SITE_OTHER): Payer: Medicare Other | Admitting: Internal Medicine

## 2012-10-26 VITALS — BP 140/88 | HR 68 | Temp 97.7°F | Wt 134.0 lb

## 2012-10-26 DIAGNOSIS — M545 Low back pain, unspecified: Secondary | ICD-10-CM | POA: Diagnosis not present

## 2012-10-26 DIAGNOSIS — E785 Hyperlipidemia, unspecified: Secondary | ICD-10-CM | POA: Diagnosis not present

## 2012-10-26 DIAGNOSIS — D649 Anemia, unspecified: Secondary | ICD-10-CM

## 2012-10-26 LAB — CBC WITH DIFFERENTIAL/PLATELET
Basophils Absolute: 0 10*3/uL (ref 0.0–0.1)
Basophils Relative: 0.7 % (ref 0.0–3.0)
Eosinophils Absolute: 0.1 10*3/uL (ref 0.0–0.7)
Eosinophils Relative: 1.9 % (ref 0.0–5.0)
HCT: 35.1 % — ABNORMAL LOW (ref 36.0–46.0)
Hemoglobin: 11.4 g/dL — ABNORMAL LOW (ref 12.0–15.0)
Lymphocytes Relative: 40.8 % (ref 12.0–46.0)
Lymphs Abs: 2.1 10*3/uL (ref 0.7–4.0)
MCHC: 32.3 g/dL (ref 30.0–36.0)
MCV: 93.1 fl (ref 78.0–100.0)
Monocytes Absolute: 0.6 10*3/uL (ref 0.1–1.0)
Monocytes Relative: 11.6 % (ref 3.0–12.0)
Neutro Abs: 2.3 10*3/uL (ref 1.4–7.7)
Neutrophils Relative %: 45 % (ref 43.0–77.0)
Platelets: 227 10*3/uL (ref 150.0–400.0)
RBC: 3.77 Mil/uL — ABNORMAL LOW (ref 3.87–5.11)
RDW: 13.8 % (ref 11.5–14.6)
WBC: 5.1 10*3/uL (ref 4.5–10.5)

## 2012-10-26 MED ORDER — HYDROCODONE-ACETAMINOPHEN 5-500 MG PO TABS: 1.0000 | ORAL_TABLET | Freq: Four times a day (QID) | ORAL | Status: DC | PRN

## 2012-10-26 NOTE — Assessment & Plan Note (Signed)
Check labs today.

## 2012-10-26 NOTE — Assessment & Plan Note (Signed)
Doing well with minimal meds (no more than 2 per day-- Vicodin)

## 2012-10-26 NOTE — Assessment & Plan Note (Signed)
Intolerant to statins. 

## 2012-10-26 NOTE — Progress Notes (Signed)
Patient ID: [name] Barker, female   DOB: 03-02-1921, 78 y.o.   MRN: [id number]  Expressive aphasia-- see hospital records and imaging  Glucose-- pt's husband says it was high in the hospital  Colon ca- no recurrence  Past Medical History  Diagnosis Date  . Colon cancer 08/19/2007    1994 T2, 2008 T3, N1  . ANEMIA-NOS 11/17/2006  . CAD 06/21/2010  . DIVERTICULOSIS-COLON 05/30/2010  . GERD 11/17/2006  . HYPERLIPIDEMIA 11/17/2006  . LOW BACK PAIN, CHRONIC 02/04/2010  . Osteopenia 11/17/2006  . Allergic rhinitis   . Small bowel obstruction   . Hemorrhoids   . TIA (transient ischemic attack)     History   Social History  . Marital Status: Married    Spouse Name: N/A    Number of Children: 6  . Years of Education: N/A   Occupational History  . Not on file.   Social History Main Topics  . Smoking status: Never Smoker   . Smokeless tobacco: Never Used  . Alcohol Use: No  . Drug Use: No  . Sexually Active: Not on file     not asked   Other Topics Concern  . Not on file   Social History Narrative  . No narrative on file    Past Surgical History  Procedure Date  . Appendectomy   . Back surgery 2007  . Hemicolectomy 1994    Right  . Abdominal hysterectomy   . Joint replacement   . Shoulder surgery   . Knee arthroscopy   . Partial colectomy 2008  . Rotator cuff repair   . Vaginal prolapse repair   . Cataract extraction   . Belpharoptosis repair     Family History  Problem Relation Age of Onset  . Lung cancer Sister   . Colon cancer Sister 20  . Kidney cancer Sister     Allergies  Allergen Reactions  . Methenamine Mandelate     REACTION: burning sensation  . Morphine Sulfate     REACTION: blister  . Penicillins     REACTION: urticaria (hives)  . Promethazine Hcl     REACTION: hallucinations  . Statins Other (See Comments)    Muscle pain     Current Outpatient Prescriptions on File Prior to Visit  Medication Sig Dispense Refill  . albuterol  (PROAIR HFA) 108 (90 BASE) MCG/ACT inhaler Inhale 2 puffs into the lungs 2 (two) times daily as needed.  8.5 g  5  . aspirin 325 MG tablet Take 325 mg by mouth daily.      . Calcium Citrate-Vitamin D (CALCIUM CITRATE + PO) Take 600 mg by mouth daily.       . Cholecalciferol (VITAMIN D) 2000 UNITS tablet Take 2,000 Units by mouth daily.       . diclofenac sodium (VOLTAREN) 1 % GEL Apply 1 application topically daily. Back pain      . Docusate Calcium (STOOL SOFTENER PO) Take 3 tablets by mouth daily.      Marland Kitchen docusate sodium (COLACE) 100 MG capsule Take 100 mg by mouth daily.       . ferrous gluconate (FERGON) 325 MG tablet Take 325 mg by mouth daily with breakfast.      . HYDROcodone-acetaminophen (VICODIN) 5-500 MG per tablet Take 1 tablet by mouth every 6 (six) hours as needed for pain.  60 tablet  3  . lansoprazole (PREVACID) 15 MG capsule Take 15 mg by mouth daily.        Marland Kitchen  loratadine (CLARITIN) 10 MG tablet Take 10 mg by mouth daily.      . Multiple Vitamin (MULTIVITAMIN) tablet Take 1 tablet by mouth daily.        . Omega-3 Fatty Acids (FISH OIL) 1200 MG CAPS Take by mouth 2 (two) times daily.       . polyethylene glycol (MIRALAX / GLYCOLAX) packet Take 17 g by mouth daily.       . Probiotic Product (ALIGN) 4 MG CAPS Take by mouth daily.        . sodium chloride (MURO 128) 5 % ophthalmic ointment Place 1 drop into both eyes daily.      Marland Kitchen triamcinolone (KENALOG) 0.025 % ointment Apply 1 application topically 2 (two) times daily.         patient denies chest pain, shortness of breath, orthopnea. Denies lower extremity edema, abdominal pain, change in appetite, change in bowel movements. Patient denies rashes, musculoskeletal complaints. No other specific complaints in a complete review of systems.   BP 140/88  Pulse 68  Temp 97.7 F (36.5 C) (Oral)  Wt 134 lb (60.782 kg)  Well-developed well-nourished female in no acute distress. HEENT exam atraumatic, normocephalic, extraocular muscles  are intact. Neck is supple. No jugular venous distention no thyromegaly. Chest clear to auscultation without increased work of breathing. Cardiac exam S1 and S2 are regular. Abdominal exam active bowel sounds, soft, nontender. Extremities no edema. Neurologic exam she is alert without any motor sensory deficits. Gait is normal.

## 2012-11-02 ENCOUNTER — Other Ambulatory Visit: Payer: Self-pay | Admitting: Internal Medicine

## 2012-11-04 ENCOUNTER — Telehealth: Payer: Self-pay

## 2012-11-04 NOTE — Telephone Encounter (Signed)
There was a med that was to be called in per pt - she has checked with pharmacy and it isnt there yet - please assist

## 2012-11-05 NOTE — Telephone Encounter (Signed)
Hydrocodone called in to [facility]

## 2012-11-11 DIAGNOSIS — N3020 Other chronic cystitis without hematuria: Secondary | ICD-10-CM | POA: Diagnosis not present

## 2012-12-01 DIAGNOSIS — L918 Other hypertrophic disorders of the skin: Secondary | ICD-10-CM | POA: Diagnosis not present

## 2013-01-14 ENCOUNTER — Ambulatory Visit (INDEPENDENT_AMBULATORY_CARE_PROVIDER_SITE_OTHER): Payer: Medicare Other | Admitting: Family Medicine

## 2013-01-14 ENCOUNTER — Encounter: Payer: Self-pay | Admitting: Family Medicine

## 2013-01-14 VITALS — BP 140/78 | HR 83 | Temp 98.3°F | Wt 136.0 lb

## 2013-01-14 DIAGNOSIS — R232 Flushing: Secondary | ICD-10-CM

## 2013-01-14 DIAGNOSIS — I10 Essential (primary) hypertension: Secondary | ICD-10-CM | POA: Diagnosis not present

## 2013-01-14 MED ORDER — LISINOPRIL 5 MG PO TABS: 5.0000 mg | ORAL_TABLET | Freq: Every day | ORAL | Status: DC

## 2013-01-14 NOTE — Progress Notes (Signed)
  Subjective:    Patient ID: [name] Barker, female    DOB: 06/26/1921, 79 y.o.   MRN: [id number]  HPI Here with her husband for 3 days of facial flushing and BP up to 160 systolic. Her diastolic remains in the 80s. No HA or lightheadedness or chest pain or SOB. Her BO seems to have crept up starting last spring and has been borderline high this winter. No recent med changes. No edema of the hands or feet. She had normal labs, including a BMET, last April.    Review of Systems  Constitutional: Negative.   HENT: Negative.   Eyes: Negative.   Respiratory: Negative.   Cardiovascular: Negative.   Neurological: Negative.        Objective:   Physical Exam  Constitutional: She is oriented to person, place, and time. She appears well-developed and well-nourished. No distress.  Eyes: Conjunctivae normal are normal. Pupils are equal, round, and reactive to light.  Neck: No thyromegaly present.  Cardiovascular: Normal rate, regular rhythm, normal heart sounds and intact distal pulses.   Pulmonary/Chest: Effort normal and breath sounds normal.  Musculoskeletal: She exhibits no edema.  Lymphadenopathy:    She has no cervical adenopathy.  Neurological: She is alert and oriented to person, place, and time.          Assessment & Plan:  She has developed mild HTN. Her husband has a supply of 5 mg Lisinopril at home that he no longer takes. This is about one year old. She will begin to take this once daily, and I asked her to follow up with Dr. Cato Mulligan in 3-4 weeks.

## 2013-01-28 DIAGNOSIS — L918 Other hypertrophic disorders of the skin: Secondary | ICD-10-CM | POA: Diagnosis not present

## 2013-02-03 DIAGNOSIS — H6120 Impacted cerumen, unspecified ear: Secondary | ICD-10-CM | POA: Diagnosis not present

## 2013-02-17 DIAGNOSIS — H903 Sensorineural hearing loss, bilateral: Secondary | ICD-10-CM | POA: Diagnosis not present

## 2013-02-20 ENCOUNTER — Emergency Department (HOSPITAL_COMMUNITY): Payer: Medicare Other

## 2013-02-20 ENCOUNTER — Emergency Department (HOSPITAL_COMMUNITY)
Admission: EM | Admit: 2013-02-20 | Discharge: 2013-02-20 | Disposition: A | Discharge status: Home / Self Care | Payer: Medicare Other | Attending: Emergency Medicine | Admitting: Emergency Medicine

## 2013-02-20 DIAGNOSIS — E785 Hyperlipidemia, unspecified: Secondary | ICD-10-CM | POA: Insufficient documentation

## 2013-02-20 DIAGNOSIS — Z79899 Other long term (current) drug therapy: Secondary | ICD-10-CM | POA: Insufficient documentation

## 2013-02-20 DIAGNOSIS — M25469 Effusion, unspecified knee: Secondary | ICD-10-CM | POA: Diagnosis not present

## 2013-02-20 DIAGNOSIS — Y9389 Activity, other specified: Secondary | ICD-10-CM | POA: Insufficient documentation

## 2013-02-20 DIAGNOSIS — S52599A Other fractures of lower end of unspecified radius, initial encounter for closed fracture: Secondary | ICD-10-CM | POA: Diagnosis not present

## 2013-02-20 DIAGNOSIS — Z8679 Personal history of other diseases of the circulatory system: Secondary | ICD-10-CM | POA: Diagnosis not present

## 2013-02-20 DIAGNOSIS — Z7982 Long term (current) use of aspirin: Secondary | ICD-10-CM | POA: Diagnosis not present

## 2013-02-20 DIAGNOSIS — Z8709 Personal history of other diseases of the respiratory system: Secondary | ICD-10-CM | POA: Diagnosis not present

## 2013-02-20 DIAGNOSIS — Z9889 Other specified postprocedural states: Secondary | ICD-10-CM | POA: Diagnosis not present

## 2013-02-20 DIAGNOSIS — I2510 Atherosclerotic heart disease of native coronary artery without angina pectoris: Secondary | ICD-10-CM | POA: Diagnosis not present

## 2013-02-20 DIAGNOSIS — S52502A Unspecified fracture of the lower end of left radius, initial encounter for closed fracture: Secondary | ICD-10-CM

## 2013-02-20 DIAGNOSIS — S0990XA Unspecified injury of head, initial encounter: Secondary | ICD-10-CM | POA: Diagnosis not present

## 2013-02-20 DIAGNOSIS — S8000XA Contusion of unspecified knee, initial encounter: Secondary | ICD-10-CM | POA: Diagnosis not present

## 2013-02-20 DIAGNOSIS — Y92009 Unspecified place in unspecified non-institutional (private) residence as the place of occurrence of the external cause: Secondary | ICD-10-CM | POA: Insufficient documentation

## 2013-02-20 DIAGNOSIS — Z8719 Personal history of other diseases of the digestive system: Secondary | ICD-10-CM | POA: Diagnosis not present

## 2013-02-20 DIAGNOSIS — M7989 Other specified soft tissue disorders: Secondary | ICD-10-CM | POA: Diagnosis not present

## 2013-02-20 DIAGNOSIS — Z85038 Personal history of other malignant neoplasm of large intestine: Secondary | ICD-10-CM | POA: Insufficient documentation

## 2013-02-20 DIAGNOSIS — W010XXA Fall on same level from slipping, tripping and stumbling without subsequent striking against object, initial encounter: Secondary | ICD-10-CM | POA: Insufficient documentation

## 2013-02-20 DIAGNOSIS — S0993XA Unspecified injury of face, initial encounter: Secondary | ICD-10-CM | POA: Diagnosis not present

## 2013-02-20 DIAGNOSIS — G8929 Other chronic pain: Secondary | ICD-10-CM | POA: Diagnosis not present

## 2013-02-20 DIAGNOSIS — Z8673 Personal history of transient ischemic attack (TIA), and cerebral infarction without residual deficits: Secondary | ICD-10-CM | POA: Diagnosis not present

## 2013-02-20 DIAGNOSIS — S8990XA Unspecified injury of unspecified lower leg, initial encounter: Secondary | ICD-10-CM | POA: Diagnosis not present

## 2013-02-20 DIAGNOSIS — K219 Gastro-esophageal reflux disease without esophagitis: Secondary | ICD-10-CM | POA: Insufficient documentation

## 2013-02-20 DIAGNOSIS — Z862 Personal history of diseases of the blood and blood-forming organs and certain disorders involving the immune mechanism: Secondary | ICD-10-CM | POA: Diagnosis not present

## 2013-02-20 DIAGNOSIS — S0180XA Unspecified open wound of other part of head, initial encounter: Secondary | ICD-10-CM | POA: Diagnosis not present

## 2013-02-20 DIAGNOSIS — S199XXA Unspecified injury of neck, initial encounter: Secondary | ICD-10-CM | POA: Diagnosis not present

## 2013-02-20 NOTE — Progress Notes (Signed)
Orthopedic Tech Progress Note Patient Details:  [name] Barker June 24, 1921 [id number]  Ortho Devices Type of Ortho Device: Velcro wrist splint;Shoulder immobilizer Ortho Device/Splint Location: (L) UE Ortho Device/Splint Interventions: Application   Jennye Moccasin 02/20/2013, 8:10 PM

## 2013-02-20 NOTE — ED Notes (Signed)
Patient back form CT  

## 2013-02-20 NOTE — ED Notes (Signed)
Pt presents to department for evaluation of fall. States she tripped over throw rug at home and fell on ground. Now states pain/bruising to L wrist, L knee and L eyebrow laceration. Did not strike head. Denies LOC. She is conscious alert and oriented x4.

## 2013-02-20 NOTE — ED Provider Notes (Signed)
History     CSN: [id number] Arrival date & time 02/20/13  1813 First MD Initiated Contact with Patient 02/20/13 1827      Chief Complaint  Patient presents with  . Fall    HPI The patient tripped over a throw rug at home this afternoon and fell onto the ground. Patient did not lose consciousness but she struck her left cheek and landed on her left knee and left wrist. The patient denies any trouble with severe headache, nausea or vomiting. She has sustained a lot of swelling in the left cheek area. She also sustained a very small superficial laceration above her left eye. The primary area of pain is the left wrist. It is swollen and is very painful to try to move. She has some bruising in her left knee and pain in that area but it is not as severe as her wrist.   Past Medical History  Diagnosis Date  . Colon cancer 08/19/2007    1994 T2, 2008 T3, N1  . ANEMIA-NOS 11/17/2006  . CAD 06/21/2010  . DIVERTICULOSIS-COLON 05/30/2010  . GERD 11/17/2006  . HYPERLIPIDEMIA 11/17/2006  . LOW BACK PAIN, CHRONIC 02/04/2010  . Osteopenia 11/17/2006  . Allergic rhinitis   . Small bowel obstruction   . Hemorrhoids   . TIA (transient ischemic attack)     Past Surgical History  Procedure Laterality Date  . Appendectomy    . Back surgery  2007  . Hemicolectomy  1994    Right  . Abdominal hysterectomy    . Joint replacement    . Shoulder surgery    . Knee arthroscopy    . Partial colectomy  2008  . Rotator cuff repair    . Vaginal prolapse repair    . Cataract extraction    . Belpharoptosis repair      Family History  Problem Relation Age of Onset  . Lung cancer Sister   . Colon cancer Sister 59  . Kidney cancer Sister     History  Substance Use Topics  . Smoking status: Never Smoker   . Smokeless tobacco: Never Used  . Alcohol Use: No    OB History   Grav Para Term Preterm Abortions TAB SAB Ect Mult Living                  Review of Systems  All other systems reviewed and are  negative.    Allergies  Methenamine mandelate; Morphine sulfate; Penicillins; Promethazine hcl; and Statins  Home Medications   Current Outpatient Rx  Name  Route  Sig  Dispense  Refill  . albuterol (PROAIR HFA) 108 (90 BASE) MCG/ACT inhaler   Inhalation   Inhale 2 puffs into the lungs 2 (two) times daily as needed.   8.5 g   5   . aspirin 325 MG tablet   Oral   Take 81 mg by mouth daily. Take 3 tablets         . Calcium Citrate-Vitamin D (CALCIUM CITRATE + PO)   Oral   Take 600 mg by mouth daily.          . Cholecalciferol (VITAMIN D) 2000 UNITS tablet   Oral   Take 2,000 Units by mouth daily.          . diclofenac sodium (VOLTAREN) 1 % GEL   Topical   Apply 1 application topically daily. Back pain         . Docusate Calcium (STOOL SOFTENER PO)   Oral  Take 3 tablets by mouth daily.         . ferrous gluconate (FERGON) 325 MG tablet   Oral   Take 325 mg by mouth daily with breakfast.         . glycerin adult (GLYCERIN ADULT) 2 G SUPP   Rectal   Place 1 suppository rectally 2 (two) times daily.         Marland Kitchen HYDROcodone-acetaminophen (VICODIN) 5-500 MG per tablet   Oral   Take 1 tablet by mouth every 6 (six) hours as needed for pain.   60 tablet   3   . lansoprazole (PREVACID) 15 MG capsule   Oral   Take 15 mg by mouth daily.           Marland Kitchen lisinopril (PRINIVIL,ZESTRIL) 5 MG tablet   Oral   Take 1 tablet (5 mg total) by mouth daily.   1 tablet   0   . loratadine (CLARITIN) 10 MG tablet   Oral   Take 10 mg by mouth daily.         . Multiple Vitamin (MULTIVITAMIN) tablet   Oral   Take 1 tablet by mouth daily.           . Omega-3 Fatty Acids (FISH OIL) 1200 MG CAPS   Oral   Take by mouth 2 (two) times daily.          . polyethylene glycol (MIRALAX / GLYCOLAX) packet   Oral   Take 17 g by mouth daily.          . Probiotic Product (ALIGN) 4 MG CAPS   Oral   Take by mouth daily.           . sodium chloride (MURO 128) 5 %  ophthalmic ointment   Both Eyes   Place 1 drop into both eyes daily.         Marland Kitchen triamcinolone (KENALOG) 0.025 % ointment   Topical   Apply 1 application topically 2 (two) times daily.           BP 166/68  Pulse 60  Temp(Src) 98.3 F (36.8 C) (Oral)  Resp 18  SpO2 99%  Physical Exam  Nursing note and vitals reviewed. Constitutional: She appears well-developed and well-nourished. No distress.  Elderly  HENT:  Head: Normocephalic.  Right Ear: External ear normal.  Left Ear: External ear normal.  Contusion left zygomatic region, tenderness palpation; half centimeter superficial laceration that is well approximated above left eyebrow;   Eyes: Conjunctivae are normal. Right eye exhibits no discharge. Left eye exhibits no discharge. No scleral icterus.  Neck: Neck supple. No tracheal deviation present.  No cervical spine tenderness  Cardiovascular: Normal rate, regular rhythm and intact distal pulses.   Pulmonary/Chest: Effort normal and breath sounds normal. No stridor. No respiratory distress. She has no wheezes. She has no rales.  Abdominal: Soft. Bowel sounds are normal. She exhibits no distension. There is no tenderness. There is no rebound and no guarding.  Musculoskeletal: She exhibits edema and tenderness.       Left wrist: She exhibits decreased range of motion, tenderness, bony tenderness, swelling, effusion and deformity. She exhibits no crepitus and no laceration.       Left knee: She exhibits swelling and ecchymosis. She exhibits normal range of motion, no deformity, no laceration and no erythema. Tenderness (mild medial joint line) found.  Neurological: She is alert. She has normal strength. No sensory deficit. Cranial nerve deficit:  no gross defecits noted.  She exhibits normal muscle tone. She displays no seizure activity. Coordination normal.  Skin: Skin is warm and dry. No rash noted.  Psychiatric: She has a normal mood and affect.    ED Course  Procedures  (including critical care time)  Labs Reviewed - No data to display Dg Wrist Complete Left  02/20/2013  *RADIOLOGY REPORT*  Clinical Data: Larey Seat.  Left wrist pain.  LEFT WRIST - COMPLETE 3+ VIEW  Comparison: None  Findings: There is a comminuted intra-articular fracture of the distal radius with mild impaction but otherwise no significant displacement.  The ulna is intact.  The carpal bones and metacarpal bones are intact.  There are advanced degenerative changes at the carpal-metacarpal joint of the thumb.  IMPRESSION: Impacted comminuted intra-articular fracture of the distal radius.   Original Report Authenticated By: Rudie Meyer, M.D.    Ct Head Wo Contrast  02/20/2013  *RADIOLOGY REPORT*  Clinical Data:  FELL.  CT HEAD WITHOUT CONTRAST CT MAXILLOFACIAL WITHOUT CONTRAST  Technique:  Multidetector CT imaging of the head and maxillofacial structures were performed using the standard protocol without intravenous contrast. Multiplanar CT image reconstructions of the maxillofacial structures were also generated.  Comparison:  Head CT 04/04/2012.  CT HEAD  Findings: The ventricles are normal for age.  No extra-axial fluid collections are seen.  The brainstem and cerebellum are unremarkable except for mild age related atrophy.  No acute intracranial findings such as infarction or hemorrhage.  No mass lesions.  The bony calvarium is intact.  The visualized paranasal sinuses and mastoid air cells are clear.  IMPRESSION: Mild age related cerebral atrophy and ventriculomegaly. No acute intracranial findings or skull fracture.  CT MAXILLOFACIAL  Findings:   No acute facial bone fractures are identified.  The paranasal sinuses and mastoid air cells are clear.  The globes are intact.  Soft tissue swelling is noted over the left maxilla likely from a direct contusion but no underlying fracture of the left maxillary sinus or orbit.  IMPRESSION: No acute facial bone fractures.   Original Report Authenticated By: Rudie Meyer, M.D.    Dg Knee Complete 4 Views Left  02/20/2013  *RADIOLOGY REPORT*  Clinical Data: Larey Seat.  Pain and swelling.  LEFT KNEE - COMPLETE 4+ VIEW  Comparison: None  Findings: The femoral and tibial knee components are intact.  No acute fracture is identified.  A small joint effusion is noted.  IMPRESSION: No acute bony findings. Small joint effusion.   Original Report Authenticated By: Rudie Meyer, M.D.    Ct Maxillofacial Wo Cm  02/20/2013  *RADIOLOGY REPORT*  Clinical Data:  FELL.  CT HEAD WITHOUT CONTRAST CT MAXILLOFACIAL WITHOUT CONTRAST  Technique:  Multidetector CT imaging of the head and maxillofacial structures were performed using the standard protocol without intravenous contrast. Multiplanar CT image reconstructions of the maxillofacial structures were also generated.  Comparison:  Head CT 04/04/2012.  CT HEAD  Findings: The ventricles are normal for age.  No extra-axial fluid collections are seen.  The brainstem and cerebellum are unremarkable except for mild age related atrophy.  No acute intracranial findings such as infarction or hemorrhage.  No mass lesions.  The bony calvarium is intact.  The visualized paranasal sinuses and mastoid air cells are clear.  IMPRESSION: Mild age related cerebral atrophy and ventriculomegaly. No acute intracranial findings or skull fracture.  CT MAXILLOFACIAL  Findings:   No acute facial bone fractures are identified.  The paranasal sinuses and mastoid air cells are clear.  The globes  are intact.  Soft tissue swelling is noted over the left maxilla likely from a direct contusion but no underlying fracture of the left maxillary sinus or orbit.  IMPRESSION: No acute facial bone fractures.   Original Report Authenticated By: Rudie Meyer, M.D.      1. Distal radius fracture, left, closed, initial encounter       MDM  Facial and Head CT normal.    Distal radius fracture noted.  Pt splinted in the ED.  She has seen [facility] ortho before and would  like to follow up with Dr Amanda Pea.        Celene Kras, MD 02/20/13 [phone]

## 2013-02-21 DIAGNOSIS — S52599A Other fractures of lower end of unspecified radius, initial encounter for closed fracture: Secondary | ICD-10-CM | POA: Diagnosis not present

## 2013-02-23 DIAGNOSIS — IMO0001 Reserved for inherently not codable concepts without codable children: Secondary | ICD-10-CM | POA: Diagnosis not present

## 2013-02-24 ENCOUNTER — Other Ambulatory Visit: Payer: Self-pay | Admitting: Internal Medicine

## 2013-02-25 NOTE — Telephone Encounter (Signed)
Ok to refill 

## 2013-03-02 ENCOUNTER — Ambulatory Visit: Payer: Medicare Other | Admitting: Internal Medicine

## 2013-03-02 DIAGNOSIS — IMO0001 Reserved for inherently not codable concepts without codable children: Secondary | ICD-10-CM | POA: Diagnosis not present

## 2013-03-09 DIAGNOSIS — IMO0001 Reserved for inherently not codable concepts without codable children: Secondary | ICD-10-CM | POA: Diagnosis not present

## 2013-03-10 ENCOUNTER — Ambulatory Visit: Payer: Medicare Other | Admitting: Internal Medicine

## 2013-03-18 DIAGNOSIS — IMO0001 Reserved for inherently not codable concepts without codable children: Secondary | ICD-10-CM | POA: Diagnosis not present

## 2013-03-22 ENCOUNTER — Encounter: Payer: Self-pay | Admitting: Internal Medicine

## 2013-03-22 ENCOUNTER — Ambulatory Visit (INDEPENDENT_AMBULATORY_CARE_PROVIDER_SITE_OTHER): Payer: Medicare Other | Admitting: Internal Medicine

## 2013-03-22 VITALS — BP 118/80 | HR 64 | Temp 97.4°F | Wt 133.0 lb

## 2013-03-22 DIAGNOSIS — K219 Gastro-esophageal reflux disease without esophagitis: Secondary | ICD-10-CM | POA: Diagnosis not present

## 2013-03-22 DIAGNOSIS — I2510 Atherosclerotic heart disease of native coronary artery without angina pectoris: Secondary | ICD-10-CM

## 2013-03-22 DIAGNOSIS — E785 Hyperlipidemia, unspecified: Secondary | ICD-10-CM

## 2013-03-22 MED ORDER — HYDROCODONE-ACETAMINOPHEN 5-325 MG PO TABS: 1.0000 | ORAL_TABLET | Freq: Three times a day (TID) | ORAL | Status: DC | PRN

## 2013-03-22 MED ORDER — DICLOFENAC SODIUM 1 % TD GEL: 1.0000 "application " | Freq: Every day | TRANSDERMAL | Status: DC | PRN

## 2013-03-22 NOTE — Assessment & Plan Note (Signed)
No sxs on prevacid

## 2013-03-22 NOTE — Assessment & Plan Note (Signed)
No sxs She is unable to take asa because of epistaxis and vaginal bleeding (followed by GYN)

## 2013-03-22 NOTE — Progress Notes (Signed)
Patient ID: [name] Barker, female   DOB: 06/14/1921, 79 y.o.   MRN: [id number]  Back pain-- controlled with hydrocodone. She also notes that bowel movements help.  Had an episode of epistaxis- ASA has been stopped.   CAD-- off of asa  GERD-- well controlled.   Reviewed pmh, psh, sochx   patient denies chest pain, shortness of breath, orthopnea. Denies lower extremity edema, abdominal pain, change in appetite, change in bowel movements. Patient denies rashes, musculoskeletal complaints. No other specific complaints in a complete review of systems.    Well-developed well-nourished female in no acute distress. HEENT exam atraumatic, normocephalic, extraocular muscles are intact. Neck is supple. No jugular venous distention no thyromegaly. Chest clear to auscultation without increased work of breathing. Cardiac exam S1 and S2 are regular. Abdominal exam active bowel sounds, soft, nontender. Extremities no edema. Neurologic exam she is alert without any motor sensory deficits. Gait is normal.

## 2013-03-22 NOTE — Assessment & Plan Note (Signed)
No need for statin given her age

## 2013-03-28 DIAGNOSIS — IMO0001 Reserved for inherently not codable concepts without codable children: Secondary | ICD-10-CM | POA: Diagnosis not present

## 2013-04-05 DIAGNOSIS — S52599A Other fractures of lower end of unspecified radius, initial encounter for closed fracture: Secondary | ICD-10-CM | POA: Diagnosis not present

## 2013-04-05 DIAGNOSIS — IMO0001 Reserved for inherently not codable concepts without codable children: Secondary | ICD-10-CM | POA: Diagnosis not present

## 2013-04-22 DIAGNOSIS — S52599A Other fractures of lower end of unspecified radius, initial encounter for closed fracture: Secondary | ICD-10-CM | POA: Diagnosis not present

## 2013-04-22 DIAGNOSIS — IMO0001 Reserved for inherently not codable concepts without codable children: Secondary | ICD-10-CM | POA: Diagnosis not present

## 2013-04-25 ENCOUNTER — Ambulatory Visit: Payer: Medicare Other | Admitting: Internal Medicine

## 2013-04-26 ENCOUNTER — Other Ambulatory Visit: Payer: Self-pay | Admitting: Obstetrics and Gynecology

## 2013-04-26 DIAGNOSIS — L918 Other hypertrophic disorders of the skin: Secondary | ICD-10-CM | POA: Diagnosis not present

## 2013-05-18 ENCOUNTER — Telehealth: Payer: Self-pay | Admitting: Internal Medicine

## 2013-05-18 NOTE — Telephone Encounter (Signed)
Ok to refill 

## 2013-05-18 NOTE — Telephone Encounter (Signed)
Pt's husband called to request an early refill of HYDROcodone-acetaminophen (NORCO/VICODIN) 5-325 MG per. Husband states pharmacy will not refill until June 1.  [facility] [address]

## 2013-05-18 NOTE — Telephone Encounter (Signed)
Is this ok?

## 2013-05-19 NOTE — Telephone Encounter (Signed)
Pharmacy notified ok for early refill

## 2013-06-07 DIAGNOSIS — L918 Other hypertrophic disorders of the skin: Secondary | ICD-10-CM | POA: Diagnosis not present

## 2013-06-15 ENCOUNTER — Other Ambulatory Visit: Payer: Self-pay

## 2013-06-15 ENCOUNTER — Telehealth: Payer: Self-pay | Admitting: Internal Medicine

## 2013-06-15 DIAGNOSIS — Z1231 Encounter for screening mammogram for malignant neoplasm of breast: Secondary | ICD-10-CM

## 2013-06-15 NOTE — Telephone Encounter (Signed)
Pt states insurance will not pay for early refill. Pt states they have "lost" or 'misplaced'  some of her pills. Husband wants to know if MD can write a new script for HYDROcodone-acetaminophen (NORCO/VICODIN) 5-325 MG per. Refill date on current bottle is 6/27.   Pharm: [facility] [address]

## 2013-06-15 NOTE — Telephone Encounter (Signed)
Pharmacy called and stated that the pt is again requesting an early refill this month HYDROcodone-acetaminophen (NORCO/VICODIN) 5-325 MG per tablet. She stated that the PT's insurance didn't cover last months supply because it was an early refill and will again deny this month if it is refilled really. She requested that if the RX is approved for an early refill, that a new RX with new directions be faxed over, to ensure we "stop the domino effect". Please assist.

## 2013-06-16 NOTE — Telephone Encounter (Signed)
Called pharmacy and approved early refill

## 2013-06-16 NOTE — Telephone Encounter (Signed)
Ok to approve meds 

## 2013-06-29 DIAGNOSIS — L821 Other seborrheic keratosis: Secondary | ICD-10-CM | POA: Diagnosis not present

## 2013-06-29 DIAGNOSIS — L280 Lichen simplex chronicus: Secondary | ICD-10-CM | POA: Diagnosis not present

## 2013-07-15 ENCOUNTER — Other Ambulatory Visit: Payer: Self-pay | Admitting: Internal Medicine

## 2013-07-19 ENCOUNTER — Ambulatory Visit
Admission: RE | Admit: 2013-07-19 | Discharge: 2013-07-19 | Disposition: A | Discharge status: Home / Self Care | Payer: Medicare Other | Source: Ambulatory Visit

## 2013-07-19 DIAGNOSIS — Z1231 Encounter for screening mammogram for malignant neoplasm of breast: Secondary | ICD-10-CM

## 2013-07-29 DIAGNOSIS — N3020 Other chronic cystitis without hematuria: Secondary | ICD-10-CM | POA: Diagnosis not present

## 2013-08-04 ENCOUNTER — Ambulatory Visit (INDEPENDENT_AMBULATORY_CARE_PROVIDER_SITE_OTHER): Payer: Medicare Other | Admitting: Internal Medicine

## 2013-08-04 ENCOUNTER — Encounter: Payer: Self-pay | Admitting: Internal Medicine

## 2013-08-04 VITALS — BP 140/80 | HR 69 | Temp 97.8°F | Resp 20 | Wt 129.0 lb

## 2013-08-04 DIAGNOSIS — M545 Low back pain, unspecified: Secondary | ICD-10-CM | POA: Diagnosis not present

## 2013-08-04 DIAGNOSIS — G8929 Other chronic pain: Secondary | ICD-10-CM | POA: Diagnosis not present

## 2013-08-04 MED ORDER — HYDROCODONE-ACETAMINOPHEN 5-325 MG PO TABS: ORAL_TABLET | ORAL | Status: DC

## 2013-08-04 NOTE — Progress Notes (Signed)
Subjective:    Patient ID: [name] Barker, female    DOB: [birth year], 79 y.o.   MRN: [id number]  HPI   79 year old patient who has history of chronic low back pain.  This has been especially bothersome for the past 7 or 8 years. She's had multiple epidural injections spinal fusions and has been on chronic narcotic therapy. She has tried 10 units without benefit. She also uses topical diclofenac with some benefit.  Stable medical problems include coronary artery disease and cerebrovascular disease  Past Medical History  Diagnosis Date  . Colon cancer 08/19/2007    1994 T2, 2008 T3, N1  . ANEMIA-NOS 11/17/2006  . CAD 06/21/2010  . DIVERTICULOSIS-COLON 05/30/2010  . GERD 11/17/2006  . HYPERLIPIDEMIA 11/17/2006  . LOW BACK PAIN, CHRONIC 02/04/2010  . Osteopenia 11/17/2006  . Allergic rhinitis   . Small bowel obstruction   . Hemorrhoids   . TIA (transient ischemic attack)     History   Social History  . Marital Status: Married    Spouse Name: N/A    Number of Children: 6  . Years of Education: N/A   Occupational History  . Not on file.   Social History Main Topics  . Smoking status: Never Smoker   . Smokeless tobacco: Never Used  . Alcohol Use: No  . Drug Use: No  . Sexually Active: Not on file     Comment: not asked   Other Topics Concern  . Not on file   Social History Narrative  . No narrative on file    Past Surgical History  Procedure Laterality Date  . Appendectomy    . Back surgery  2007  . Hemicolectomy  1994    Right  . Abdominal hysterectomy    . Joint replacement    . Shoulder surgery    . Knee arthroscopy    . Partial colectomy  2008  . Rotator cuff repair    . Vaginal prolapse repair    . Cataract extraction    . Belpharoptosis repair      Family History  Problem Relation Age of Onset  . Lung cancer Sister   . Colon cancer Sister 85  . Kidney cancer Sister     Allergies  Allergen Reactions  . Methenamine Mandelate     REACTION:  burning sensation  . Morphine Sulfate     REACTION: blister  . Penicillins     REACTION: urticaria (hives)  . Promethazine Hcl     REACTION: hallucinations  . Statins Other (See Comments)    Muscle pain     Current Outpatient Prescriptions on File Prior to Visit  Medication Sig Dispense Refill  . albuterol (PROVENTIL HFA;VENTOLIN HFA) 108 (90 BASE) MCG/ACT inhaler Inhale 2 puffs into the lungs 2 (two) times daily as needed. For wheezing      . Calcium Citrate-Vitamin D (CALCIUM CITRATE + PO) Take 600 mg by mouth every evening.       . Cholecalciferol (VITAMIN D) 2000 UNITS tablet Take 2,000 Units by mouth daily.       . diclofenac sodium (VOLTAREN) 1 % GEL Apply 1 application topically daily as needed. Back pain  400 g  3  . Docusate Calcium (STOOL SOFTENER PO) Take 3 tablets by mouth every evening.       Marland Kitchen glycerin adult (GLYCERIN ADULT) 2 G SUPP Place 1 suppository rectally daily.       . lansoprazole (PREVACID) 15 MG capsule Take 15 mg by mouth daily.        Marland Kitchen  loratadine (CLARITIN) 10 MG tablet Take 10 mg by mouth daily.      . polyethylene glycol (MIRALAX / GLYCOLAX) packet Take 17 g by mouth at bedtime.       . Probiotic Product (ALIGN) 4 MG CAPS Take 1 capsule by mouth daily.       Marland Kitchen senna-docusate (SENOKOT-S) 8.6-50 MG per tablet Take 1 tablet by mouth every evening.      . sodium chloride (MURO 128) 5 % ophthalmic ointment Place 1 drop into both eyes daily.       No current facility-administered medications on file prior to visit.    BP 140/80  Pulse 69  Temp(Src) 97.8 F (36.6 C) (Oral)  Resp 20  Wt 129 lb (58.514 kg)  BMI 26.04 kg/m2  SpO2 96%       Review of Systems  Constitutional: Negative.   HENT: Negative for hearing loss, congestion, sore throat, rhinorrhea, dental problem, sinus pressure and tinnitus.   Eyes: Negative for pain, discharge and visual disturbance.  Respiratory: Negative for cough and shortness of breath.   Cardiovascular: Negative for  chest pain, palpitations and leg swelling.  Gastrointestinal: Negative for nausea, vomiting, abdominal pain, diarrhea, constipation, blood in stool and abdominal distention.  Genitourinary: Negative for dysuria, urgency, frequency, hematuria, flank pain, vaginal bleeding, vaginal discharge, difficulty urinating, vaginal pain and pelvic pain.  Musculoskeletal: Positive for back pain and gait problem. Negative for joint swelling and arthralgias.  Skin: Negative for rash.  Neurological: Negative for dizziness, syncope, speech difficulty, weakness, numbness and headaches.  Hematological: Negative for adenopathy.  Psychiatric/Behavioral: Negative for behavioral problems, dysphoric mood and agitation. The patient is not nervous/anxious.        Objective:   Physical Exam  Constitutional: She appears well-developed and well-nourished. No distress.  Alert Fairly energetic for 79 years of age Able to transfer from a sitting position to the examining table without assistance  Musculoskeletal:  Low back musculature slightly tight and tense          Assessment & Plan:  Chronic low back pain We'll continue present therapy. We'll increase frequency of the hydrocodone 2 every 6 hours if needed We'll try  heat to the lumbar region

## 2013-08-04 NOTE — Patient Instructions (Signed)
Call or return to clinic prn if these symptoms worsen or fail to improve as anticipated.

## 2013-08-05 ENCOUNTER — Encounter: Payer: Self-pay | Admitting: *Deleted

## 2013-08-05 DIAGNOSIS — R269 Unspecified abnormalities of gait and mobility: Secondary | ICD-10-CM | POA: Insufficient documentation

## 2013-08-18 DIAGNOSIS — H6120 Impacted cerumen, unspecified ear: Secondary | ICD-10-CM | POA: Diagnosis not present

## 2013-08-23 DIAGNOSIS — L918 Other hypertrophic disorders of the skin: Secondary | ICD-10-CM | POA: Diagnosis not present

## 2013-09-14 DIAGNOSIS — Z23 Encounter for immunization: Secondary | ICD-10-CM | POA: Diagnosis not present

## 2013-09-26 ENCOUNTER — Encounter: Payer: Self-pay | Admitting: Internal Medicine

## 2013-09-26 ENCOUNTER — Telehealth: Payer: Self-pay | Admitting: Internal Medicine

## 2013-09-26 ENCOUNTER — Ambulatory Visit (INDEPENDENT_AMBULATORY_CARE_PROVIDER_SITE_OTHER): Payer: Medicare Other | Admitting: Internal Medicine

## 2013-09-26 VITALS — BP 124/72 | HR 60 | Temp 98.1°F | Ht 59.0 in | Wt 128.0 lb

## 2013-09-26 DIAGNOSIS — D649 Anemia, unspecified: Secondary | ICD-10-CM

## 2013-09-26 DIAGNOSIS — M545 Low back pain, unspecified: Secondary | ICD-10-CM

## 2013-09-26 DIAGNOSIS — I2510 Atherosclerotic heart disease of native coronary artery without angina pectoris: Secondary | ICD-10-CM | POA: Diagnosis not present

## 2013-09-26 DIAGNOSIS — E785 Hyperlipidemia, unspecified: Secondary | ICD-10-CM

## 2013-09-26 DIAGNOSIS — C189 Malignant neoplasm of colon, unspecified: Secondary | ICD-10-CM

## 2013-09-26 LAB — CBC WITH DIFFERENTIAL/PLATELET
Basophils Absolute: 0 10*3/uL (ref 0.0–0.1)
Basophils Relative: 0.7 % (ref 0.0–3.0)
Eosinophils Absolute: 0.2 10*3/uL (ref 0.0–0.7)
Eosinophils Relative: 3.7 % (ref 0.0–5.0)
HCT: 34.7 % — ABNORMAL LOW (ref 36.0–46.0)
Hemoglobin: 11.5 g/dL — ABNORMAL LOW (ref 12.0–15.0)
Lymphocytes Relative: 37.6 % (ref 12.0–46.0)
Lymphs Abs: 2.2 10*3/uL (ref 0.7–4.0)
MCHC: 33 g/dL (ref 30.0–36.0)
MCV: 90.3 fl (ref 78.0–100.0)
Monocytes Absolute: 0.6 10*3/uL (ref 0.1–1.0)
Monocytes Relative: 9.9 % (ref 3.0–12.0)
Neutro Abs: 2.8 10*3/uL (ref 1.4–7.7)
Neutrophils Relative %: 48.1 % (ref 43.0–77.0)
Platelets: 241 10*3/uL (ref 150.0–400.0)
RBC: 3.84 Mil/uL — ABNORMAL LOW (ref 3.87–5.11)
RDW: 13.4 % (ref 11.5–14.6)
WBC: 5.8 10*3/uL (ref 4.5–10.5)

## 2013-09-26 LAB — BASIC METABOLIC PANEL
BUN: 11 mg/dL (ref 6–23)
CO2: 28 mEq/L (ref 19–32)
Calcium: 8.7 mg/dL (ref 8.4–10.5)
Chloride: 104 mEq/L (ref 96–112)
Creatinine, Ser: 0.6 mg/dL (ref 0.4–1.2)
GFR: 101.69 mL/min (ref >60.00)
Glucose, Bld: 89 mg/dL (ref 70–99)
Potassium: 4.7 mEq/L (ref 3.5–5.1)
Sodium: 137 mEq/L (ref 135–145)

## 2013-09-26 LAB — HEPATIC FUNCTION PANEL
ALT: 11 U/L (ref 0–35)
AST: 22 U/L (ref 0–37)
Albumin: 3.5 g/dL (ref 3.5–5.2)
Alkaline Phosphatase: 46 U/L (ref 39–117)
Bilirubin, Direct: 0 mg/dL (ref 0.0–0.3)
Total Bilirubin: 0.4 mg/dL (ref 0.3–1.2)
Total Protein: 6.4 g/dL (ref 6.0–8.3)

## 2013-09-26 LAB — LDL CHOLESTEROL, DIRECT: Direct LDL: 133.6 mg/dL

## 2013-09-26 LAB — LIPID PANEL
Cholesterol: 201 mg/dL — ABNORMAL HIGH (ref 0–200)
HDL: 46.9 mg/dL (ref >39.00)
Total CHOL/HDL Ratio: 4
Triglycerides: 98 mg/dL (ref 0.0–149.0)
VLDL: 19.6 mg/dL (ref 0.0–40.0)

## 2013-09-26 LAB — TSH: TSH: 4.25 u[IU]/mL (ref 0.35–5.50)

## 2013-09-26 NOTE — Progress Notes (Signed)
Back pain- chronic- she has regular f/u with pain clinic. Using hydrocodone 3 times daily wonders whether she can take an additional one because sometimes pain comes every 5-6 hours, not 8. Back pain is most bothersome at night-- hydorocodone generally helps.  Vaginal bleeding- sees dr Vincente Poli.  Fractured wrist-- resolved  Reviewed pmh, psh, sochx Reviewed meds  Reviewed vitals Elderly female NAD Chest cta cv reg rate abd- soft, NT, N extr- no edema

## 2013-09-26 NOTE — Telephone Encounter (Signed)
Pt husband called and stated that at her appt this morning, they where supposed to receive an RX for HYDROcodone-acetaminophen (NORCO/VICODIN) 5-325 MG per tablet, 4x a day. He states that they left without the rx, and he would like to know if he is going to be able to pick it up this afternoon. Please assist.

## 2013-09-26 NOTE — Telephone Encounter (Signed)
This was called in to pharmacy on 08/14/13 with 3 refill, pt aware

## 2013-09-27 NOTE — Assessment & Plan Note (Signed)
She has no sxs ridk factor modification

## 2013-09-27 NOTE — Assessment & Plan Note (Signed)
She has regular f/u with oncology

## 2013-09-27 NOTE — Assessment & Plan Note (Signed)
Ok to stay with hydrocodone because it is working Can take up to 4 hydrocodone per day

## 2013-10-10 ENCOUNTER — Telehealth: Payer: Self-pay | Admitting: Internal Medicine

## 2013-10-10 NOTE — Telephone Encounter (Signed)
rx up front for p/u, pt aware 

## 2013-10-10 NOTE — Telephone Encounter (Signed)
Pt needs rx for supplies for tens unit with.dx code  Must be something related to pain due to bowel adhesion. Pt had 5 abd surgeries. Pt hus will pick rx

## 2013-10-18 ENCOUNTER — Telehealth: Payer: Self-pay | Admitting: Internal Medicine

## 2013-10-18 MED ORDER — HYDROCODONE-ACETAMINOPHEN 5-325 MG PO TABS: ORAL_TABLET | ORAL | Status: DC

## 2013-10-18 NOTE — Telephone Encounter (Signed)
Pt needs new rx hydrocodone °

## 2013-10-18 NOTE — Telephone Encounter (Signed)
Ok per Dr Swords, rx up front for p/u, pt aware 

## 2013-10-20 DIAGNOSIS — L918 Other hypertrophic disorders of the skin: Secondary | ICD-10-CM | POA: Diagnosis not present

## 2013-10-24 ENCOUNTER — Encounter (INDEPENDENT_AMBULATORY_CARE_PROVIDER_SITE_OTHER): Payer: Medicare Other | Admitting: Ophthalmology

## 2013-10-24 DIAGNOSIS — H43819 Vitreous degeneration, unspecified eye: Secondary | ICD-10-CM

## 2013-10-24 DIAGNOSIS — H35379 Puckering of macula, unspecified eye: Secondary | ICD-10-CM | POA: Diagnosis not present

## 2013-10-24 DIAGNOSIS — H33309 Unspecified retinal break, unspecified eye: Secondary | ICD-10-CM

## 2013-11-01 DIAGNOSIS — H52229 Regular astigmatism, unspecified eye: Secondary | ICD-10-CM | POA: Diagnosis not present

## 2013-11-01 DIAGNOSIS — H179 Unspecified corneal scar and opacity: Secondary | ICD-10-CM | POA: Diagnosis not present

## 2013-11-01 DIAGNOSIS — H5200 Hypermetropia, unspecified eye: Secondary | ICD-10-CM | POA: Diagnosis not present

## 2013-11-01 DIAGNOSIS — H524 Presbyopia: Secondary | ICD-10-CM | POA: Diagnosis not present

## 2013-11-18 ENCOUNTER — Telehealth: Payer: Self-pay | Admitting: Internal Medicine

## 2013-11-18 MED ORDER — HYDROCODONE-ACETAMINOPHEN 5-325 MG PO TABS: ORAL_TABLET | ORAL | Status: DC

## 2013-11-18 NOTE — Telephone Encounter (Signed)
Ok per Dr Cato Mulligan, rx will be ready for p/u on Monday, pt aware

## 2013-11-18 NOTE — Telephone Encounter (Signed)
Pt needs new rx hydrocodone °

## 2013-12-15 DIAGNOSIS — L918 Other hypertrophic disorders of the skin: Secondary | ICD-10-CM | POA: Diagnosis not present

## 2013-12-27 ENCOUNTER — Telehealth: Payer: Self-pay | Admitting: Internal Medicine

## 2013-12-27 NOTE — Telephone Encounter (Signed)
Pt's spouse is requesting a refill of her HYDROcodone-acetaminophen (NORCO/VICODIN) 5-325 MG per tablet. Please call when ready for pick up.

## 2013-12-28 NOTE — Telephone Encounter (Signed)
Looks like from your last office note she has been getting these from pain clinic?  Could you review your note and advise

## 2014-01-03 NOTE — Telephone Encounter (Signed)
Call and ask patient or patient's husband

## 2014-01-03 NOTE — Telephone Encounter (Signed)
Per husband pt has seen Dr Hardin Negus in the past but it has been a couple of years ago and he has never given her hydrocodone.

## 2014-01-05 ENCOUNTER — Other Ambulatory Visit: Payer: Self-pay | Admitting: Internal Medicine

## 2014-01-05 MED ORDER — HYDROCODONE-ACETAMINOPHEN 5-325 MG PO TABS: ORAL_TABLET | ORAL | Status: DC

## 2014-01-05 NOTE — Telephone Encounter (Signed)
Ok to refill meds

## 2014-01-05 NOTE — Telephone Encounter (Signed)
rx ready for pl/u, pts husband aware

## 2014-01-26 ENCOUNTER — Other Ambulatory Visit: Payer: Self-pay | Admitting: Family

## 2014-01-26 MED ORDER — ALBUTEROL SULFATE HFA 108 (90 BASE) MCG/ACT IN AERS: 2.0000 | INHALATION_SPRAY | Freq: Two times a day (BID) | RESPIRATORY_TRACT | Status: DC | PRN

## 2014-02-10 ENCOUNTER — Telehealth: Payer: Self-pay | Admitting: Internal Medicine

## 2014-02-10 ENCOUNTER — Other Ambulatory Visit: Payer: Self-pay | Admitting: Family

## 2014-02-10 MED ORDER — HYDROCODONE-ACETAMINOPHEN 5-325 MG PO TABS: ORAL_TABLET | ORAL | Status: DC

## 2014-02-10 NOTE — Telephone Encounter (Signed)
Pt needs new rx hydrocodone °

## 2014-02-14 NOTE — Telephone Encounter (Signed)
rx was called in on 02/10/14 by Roxy Cedar, NP

## 2014-02-20 DIAGNOSIS — M795 Residual foreign body in soft tissue: Secondary | ICD-10-CM | POA: Diagnosis not present

## 2014-02-20 DIAGNOSIS — M79609 Pain in unspecified limb: Secondary | ICD-10-CM | POA: Diagnosis not present

## 2014-02-20 DIAGNOSIS — M25579 Pain in unspecified ankle and joints of unspecified foot: Secondary | ICD-10-CM | POA: Diagnosis not present

## 2014-02-27 DIAGNOSIS — L918 Other hypertrophic disorders of the skin: Secondary | ICD-10-CM | POA: Diagnosis not present

## 2014-02-27 IMAGING — CT CT ENTEROGRAPHY (ABD-PELV W/ CM)
2 of 6 series · 17 of 46 positions shown, 19 images · IV contrast (Omnipaque 300)
Comparison: The CT scan 07/30/2011.

CLINICAL DATA: Intermittent vaginal bleeding and discharge.
Evaluate for possible  rectovaginal fistula.

CT ABDOMEN AND PELVIS WITH CONTRAST (CT ENTEROGRAPHY)
TECHNIQUE: Multidetector CT of the abdomen and pelvis during bolus
administration of intravenous contrast. Negative oral contrast
VoLumen was given.
Contrast: 100mL OMNIPAQUE IOHEXOL 300 MG/ML  SOLN

[Series 4: enterography standard · axial · 0.68mm/px · z∈[-446,-46]mm · 14 of 224 slices shown, 16 images]
[im 12/224  soft-tissue]
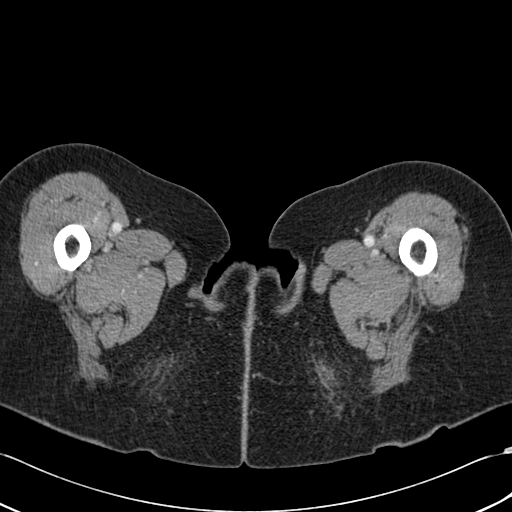
[im 12/224  bone]
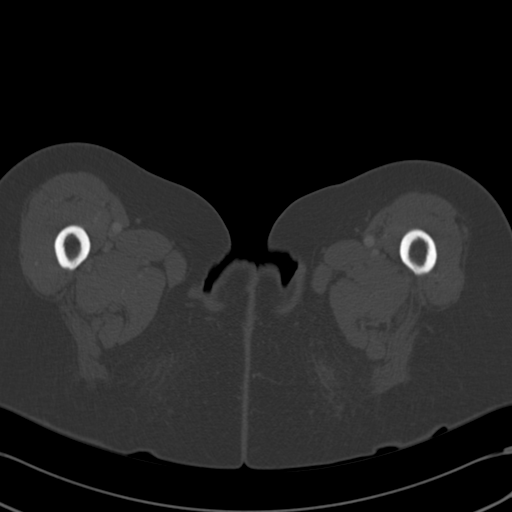
[im 24/224  soft-tissue]
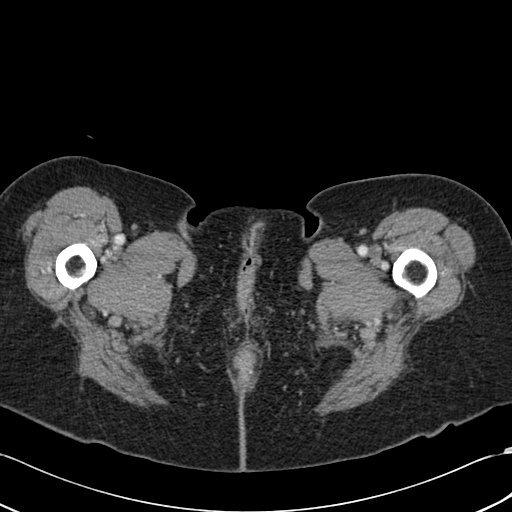
[im 47/224  soft-tissue]
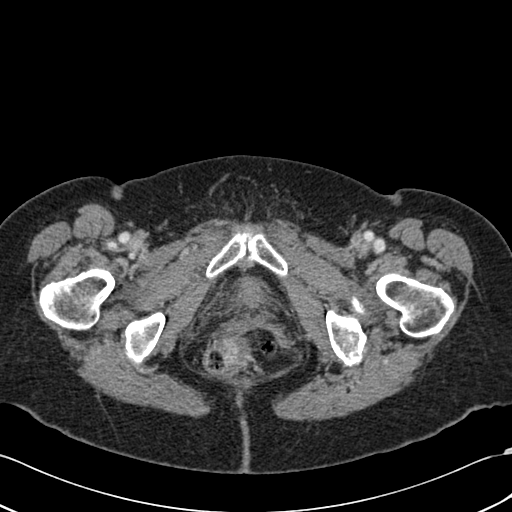
[im 59/224  soft-tissue]
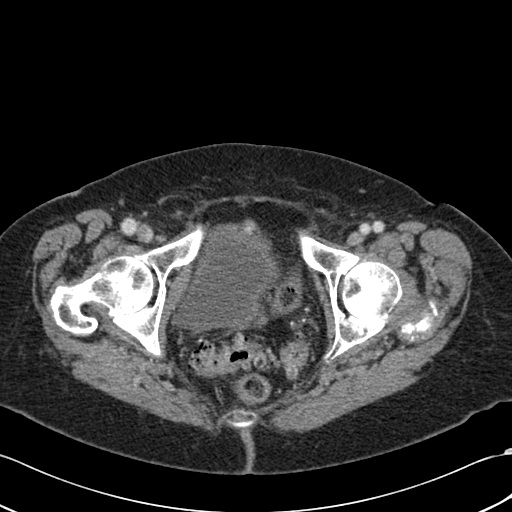
[im 71/224  soft-tissue]
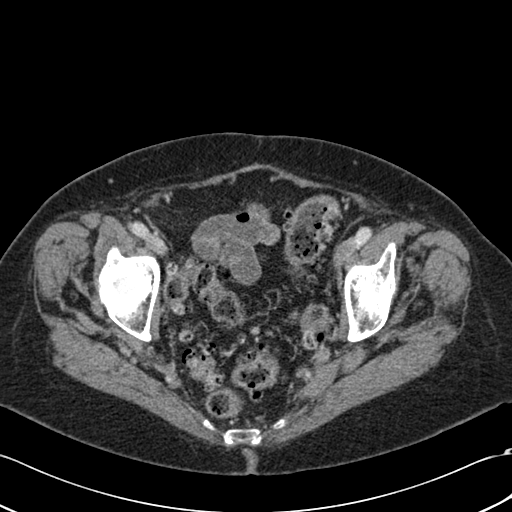
[im 94/224  soft-tissue]
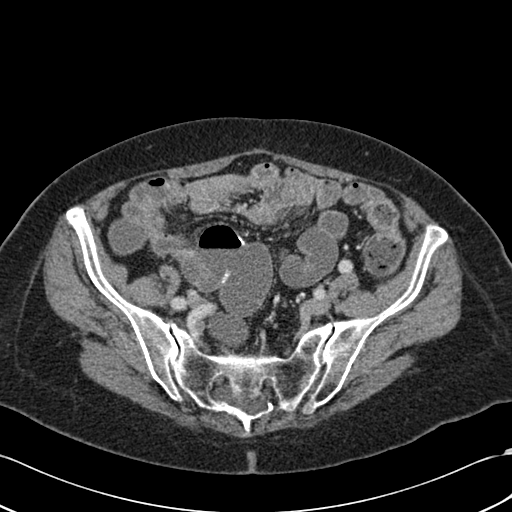
[im 106/224  soft-tissue]
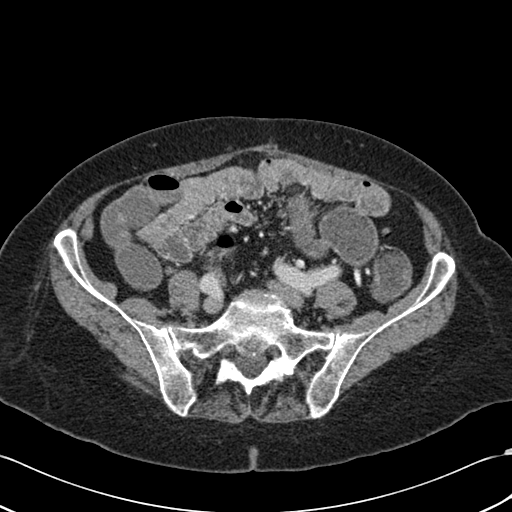
[im 118/224  soft-tissue]
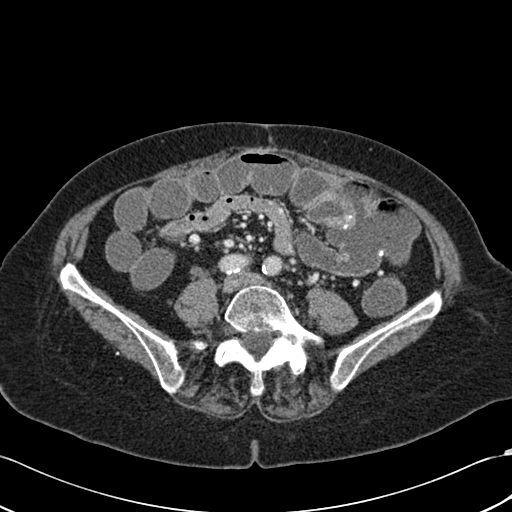
[im 130/224  soft-tissue]
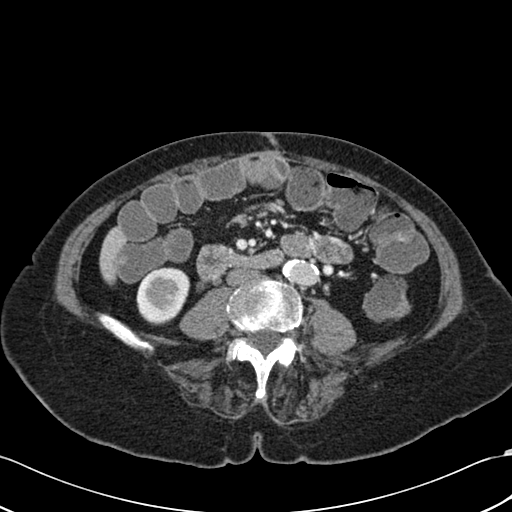
[im 130/224  bone]
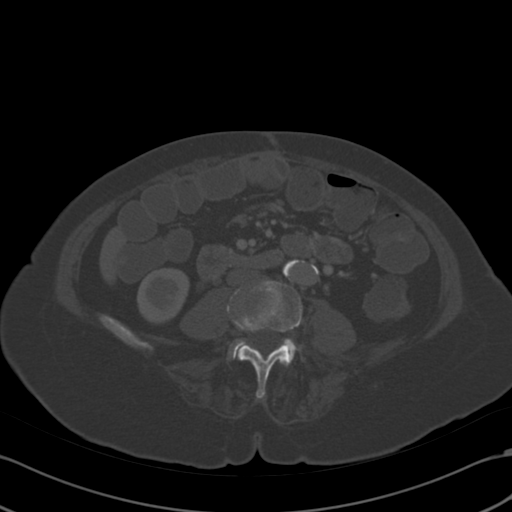
[im 153/224  soft-tissue]
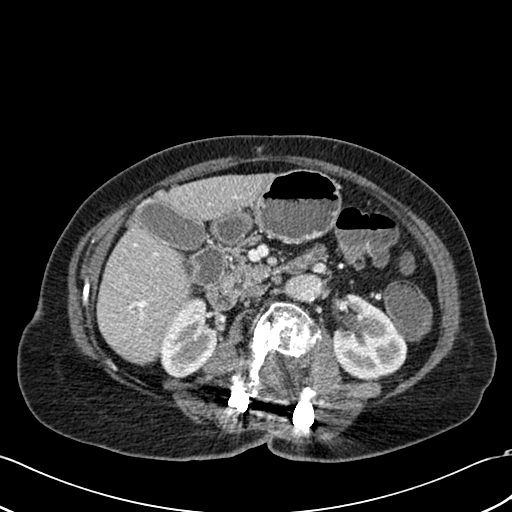
[im 165/224  soft-tissue]
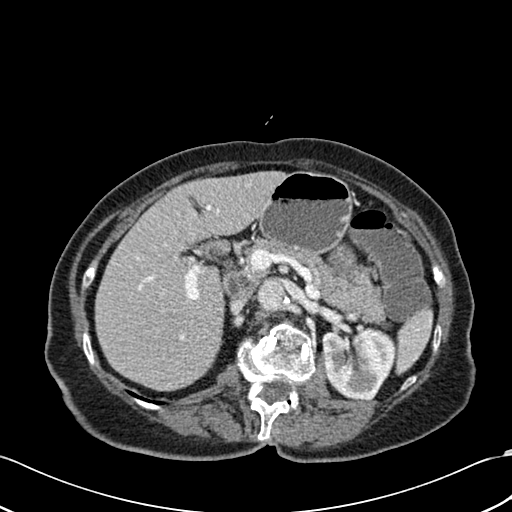
[im 177/224  soft-tissue]
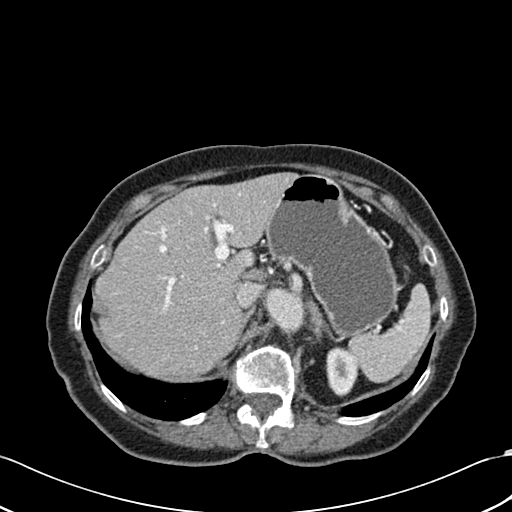
[im 200/224  soft-tissue]
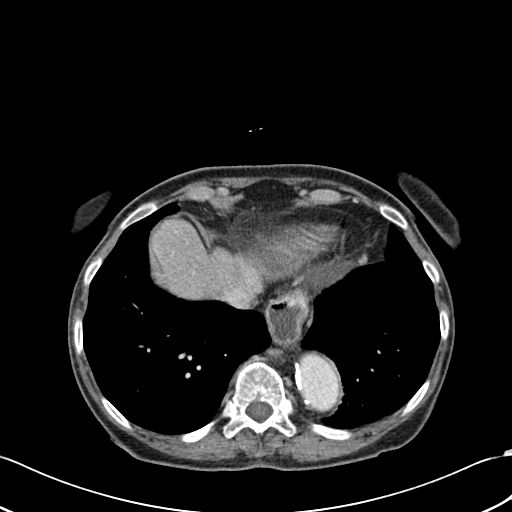
[im 212/224  soft-tissue]
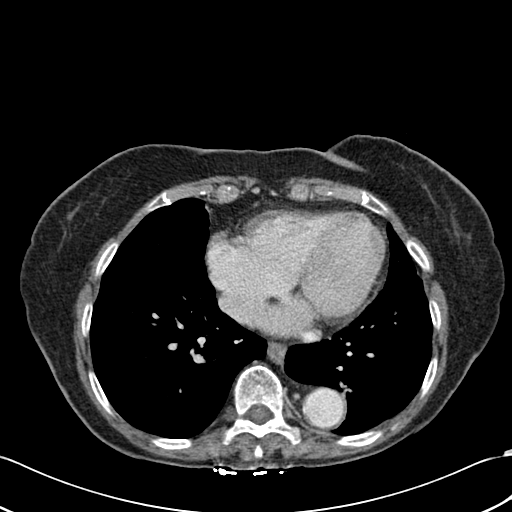

[Series 602: cor · coronal · 0.88mm/px · 3 of 103 slices shown]
[im 35/103  soft-tissue]
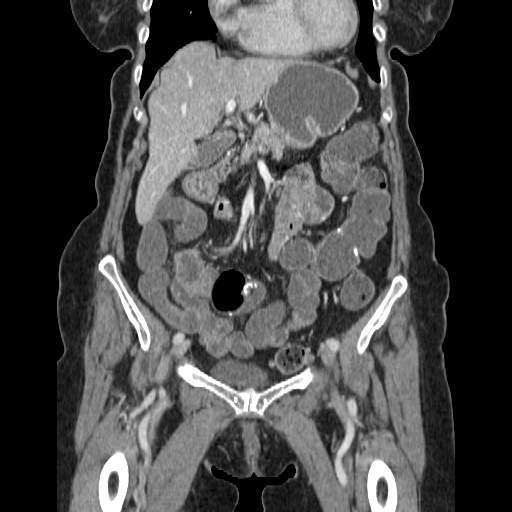
[im 46/103  soft-tissue]
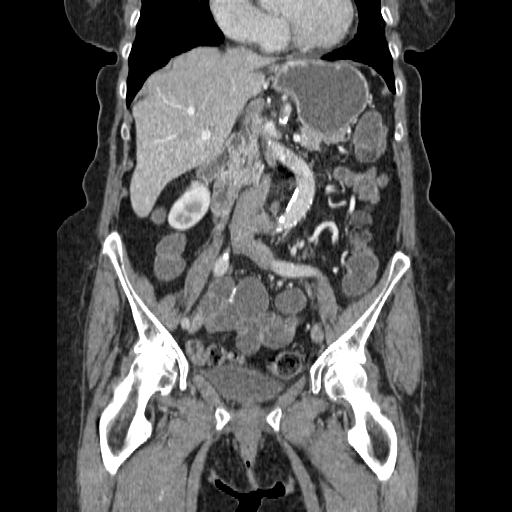
[im 57/103  soft-tissue]
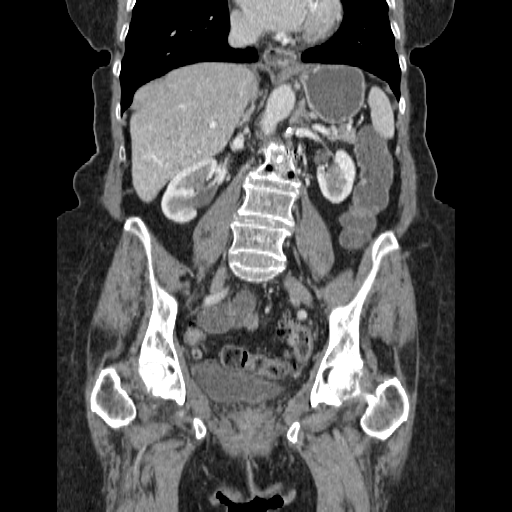

[17 of 46 positions shown; findings below may reference images not displayed]

FINDINGS: The lung bases are clear.  No pleural effusion or
pulmonary nodule. A moderate sized hiatal hernia is noted.

The liver is unremarkable.  No focal lesions or intrahepatic
biliary dilatation.  The gallbladder is normal.  No common bile
duct dilatation.  The pancreas is normal.  The spleen is normal.
The adrenal glands and kidneys are normal.

The stomach, duodenum, small bowel and colon are unremarkable.  No
inflammatory changes, obstruction or mass.  The anastomotic sites
appear normal.  There is tortuosity and redundancy of the sigmoid
colon with marked diverticulosis but no findings for acute
diverticulitis. There is marked tortuosity of the abdominal aorta
with moderate atherosclerotic changes but no focal aneurysm or
dissection.  The major branch vessels are patent.

The uterus is surgically absent.  No pelvic mass or adenopathy.  No
obvious CT findings for rectovaginal fistula.  No air is seen in
the vagina.  If symptoms persist the best test would be a pelvic CT
scan with rectal contrast only.

No inguinal mass, adenopathy or hernia.  The bony pelvis is intact.
Moderate degenerative changes noted throughout the spine.  Spinal
stabilization hardware is noted.
IMPRESSION: 1.  No acute abdominal/pelvic findings, mass lesions or adenopathy.
2.  No CT findings to suggest a rectovaginal fistula.  If symptoms
persist a pelvic CT with rectal contrast is recommended.

## 2014-03-16 ENCOUNTER — Telehealth: Payer: Self-pay | Admitting: Internal Medicine

## 2014-03-16 NOTE — Telephone Encounter (Signed)
Pt request refill HYDROcodone-acetaminophen (NORCO/VICODIN) 5-325 MG per tablet °

## 2014-03-17 MED ORDER — HYDROCODONE-ACETAMINOPHEN 5-325 MG PO TABS: ORAL_TABLET | ORAL | Status: DC

## 2014-03-17 NOTE — Telephone Encounter (Signed)
Ok per Dr Leanne Chang, rx up front ready for p/u Monday 03/20/14

## 2014-03-20 DIAGNOSIS — D2370 Other benign neoplasm of skin of unspecified lower limb, including hip: Secondary | ICD-10-CM | POA: Diagnosis not present

## 2014-04-13 DIAGNOSIS — H903 Sensorineural hearing loss, bilateral: Secondary | ICD-10-CM | POA: Diagnosis not present

## 2014-04-13 DIAGNOSIS — H6120 Impacted cerumen, unspecified ear: Secondary | ICD-10-CM | POA: Diagnosis not present

## 2014-04-17 ENCOUNTER — Telehealth: Payer: Self-pay | Admitting: Internal Medicine

## 2014-04-17 MED ORDER — HYDROCODONE-ACETAMINOPHEN 5-325 MG PO TABS: ORAL_TABLET | ORAL | Status: DC

## 2014-04-17 NOTE — Telephone Encounter (Signed)
Pt's husband is calling regarding pt needing new rx for HYDROcodone-acetaminophen (NORCO/VICODIN) 5-325 MG per tablet, please call when available for pick up.

## 2014-04-18 NOTE — Telephone Encounter (Signed)
rx up front for p/u, pt aware

## 2014-04-27 ENCOUNTER — Other Ambulatory Visit: Payer: Self-pay | Admitting: Internal Medicine

## 2014-05-01 DIAGNOSIS — D2370 Other benign neoplasm of skin of unspecified lower limb, including hip: Secondary | ICD-10-CM | POA: Diagnosis not present

## 2014-05-15 ENCOUNTER — Other Ambulatory Visit: Payer: Self-pay | Admitting: Family

## 2014-05-15 MED ORDER — HYDROCODONE-ACETAMINOPHEN 5-325 MG PO TABS: ORAL_TABLET | ORAL | Status: DC

## 2014-05-26 ENCOUNTER — Encounter: Payer: Self-pay | Admitting: Family

## 2014-05-26 ENCOUNTER — Ambulatory Visit (INDEPENDENT_AMBULATORY_CARE_PROVIDER_SITE_OTHER): Payer: Medicare Other | Admitting: Family

## 2014-05-26 VITALS — BP 122/70 | HR 67 | Temp 97.9°F | Ht 59.0 in | Wt 132.0 lb

## 2014-05-26 DIAGNOSIS — IMO0002 Reserved for concepts with insufficient information to code with codable children: Secondary | ICD-10-CM

## 2014-05-26 DIAGNOSIS — G8929 Other chronic pain: Secondary | ICD-10-CM

## 2014-05-26 DIAGNOSIS — M545 Low back pain, unspecified: Secondary | ICD-10-CM | POA: Diagnosis not present

## 2014-05-26 MED ORDER — HYDROCODONE-ACETAMINOPHEN 5-325 MG PO TABS: ORAL_TABLET | ORAL | Status: DC

## 2014-05-26 MED ORDER — HYDROCODONE-ACETAMINOPHEN 10-325 MG PO TABS: 0.5000 | ORAL_TABLET | Freq: Three times a day (TID) | ORAL | Status: DC | PRN

## 2014-05-26 NOTE — Progress Notes (Signed)
Subjective:    Patient ID: [name] Barker, female    DOB: 11-19-1921, 80 y.o.   MRN: [id number]  HPI 80 year old female presents for medication refill for chronic back pain. Denies injury or acute issues at this time. She takes hydrocodone every 8 hours that does not always relieve pain. She is requesting an increase in her pain medication.    Review of Systems  Constitutional: Negative.   HENT: Negative.   Eyes: Negative.   Respiratory: Negative.   Cardiovascular: Negative.   Gastrointestinal: Negative.   Endocrine: Negative.   Genitourinary: Negative.   Musculoskeletal: Positive for back pain.  Skin: Negative.    Past Medical History  Diagnosis Date  . Colon cancer 08/19/2007    1994 T2, 2008 T3, N1  . ANEMIA-NOS 11/17/2006  . CAD 06/21/2010  . DIVERTICULOSIS-COLON 05/30/2010  . GERD 11/17/2006  . HYPERLIPIDEMIA 11/17/2006  . LOW BACK PAIN, CHRONIC 02/04/2010  . Osteopenia 11/17/2006  . Allergic rhinitis   . Small bowel obstruction   . Hemorrhoids   . TIA (transient ischemic attack)     History   Social History  . Marital Status: Married    Spouse Name: N/A    Number of Children: 6  . Years of Education: N/A   Occupational History  . Not on file.   Social History Main Topics  . Smoking status: Never Smoker   . Smokeless tobacco: Never Used  . Alcohol Use: No  . Drug Use: No  . Sexual Activity: Not on file     Comment: not asked   Other Topics Concern  . Not on file   Social History Narrative  . No narrative on file    Past Surgical History  Procedure Laterality Date  . Appendectomy    . Back surgery  2007  . Hemicolectomy  1994    Right  . Abdominal hysterectomy    . Joint replacement    . Shoulder surgery    . Knee arthroscopy    . Partial colectomy  2008  . Rotator cuff repair    . Vaginal prolapse repair    . Cataract extraction    . Belpharoptosis repair      Family History  Problem Relation Age of Onset  . Lung cancer Sister     . Colon cancer Sister 74  . Kidney cancer Sister     Allergies  Allergen Reactions  . Methenamine Mandelate     REACTION: burning sensation  . Morphine Sulfate     REACTION: blister  . Penicillins     REACTION: urticaria (hives)  . Promethazine Hcl     REACTION: hallucinations  . Statins Other (See Comments)    Muscle pain     Current Outpatient Prescriptions on File Prior to Visit  Medication Sig Dispense Refill  . albuterol (PROVENTIL HFA;VENTOLIN HFA) 108 (90 BASE) MCG/ACT inhaler Inhale 2 puffs into the lungs 2 (two) times daily as needed. For wheezing  1 Inhaler  4  . aspirin 81 MG tablet Take 162 mg by mouth daily.      . Cholecalciferol (VITAMIN D) 2000 UNITS tablet Take 2,000 Units by mouth daily.       [name] Calcium (STOOL SOFTENER PO) Take 3 tablets by mouth every evening.       Marland Kitchen glycerin adult (GLYCERIN ADULT) 2 G SUPP Place 1 suppository rectally daily.       . lansoprazole (PREVACID) 15 MG capsule Take 15 mg by mouth  daily.        . loratadine (CLARITIN) 10 MG tablet Take 10 mg by mouth daily.      . polyethylene glycol (MIRALAX / GLYCOLAX) packet Take 17 g by mouth at bedtime.       Marland Kitchen PREMARIN vaginal cream Place 0.5 g vaginally. Monday, Wednesday, Friday      . Probiotic Product (ALIGN) 4 MG CAPS Take 1 capsule by mouth daily.       Marland Kitchen senna-docusate (SENOKOT-S) 8.6-50 MG per tablet Take 1 tablet by mouth every evening.      . sodium chloride (MURO 128) 5 % ophthalmic ointment Place 1 drop into both eyes daily.      Marland Kitchen triamcinolone (KENALOG) 0.025 % ointment Apply 1 application topically 2 (two) times daily.      . VOLTAREN 1 % GEL APPLY TO AFFECTED AREA EVERY DAY AS NEEDED BACK PAIN  400 g  5   No current facility-administered medications on file prior to visit.    BP 122/70  Pulse 67  Temp(Src) 97.9 F (36.6 C) (Oral)  Ht 4\' 11"  (1.499 m)  Wt 132 lb (59.875 kg)  BMI 26.65 kg/m2  SpO2 96%chart    Objective:   Physical Exam  Constitutional: No  distress.  HENT:  Head: Normocephalic.  Cardiovascular: Normal rate and regular rhythm.   Pulmonary/Chest: Effort normal and breath sounds normal.  Musculoskeletal: Normal range of motion.  Neurological: She is alert.  Skin: Skin is warm and dry. She is not diaphoretic.  Psychiatric: She has a normal mood and affect. Her behavior is normal. Judgment and thought content normal.          Assessment & Plan:  Endia was seen today for follow-up.  Diagnoses and associated orders for this visit:  DDD (degenerative disc disease)  Chronic low back pain  Other Orders - Discontinue: HYDROcodone-acetaminophen (NORCO/VICODIN) 5-325 MG per tablet; TAKE 1 TABLET EVERY 6 HOURS AS NEEDED FOR PAIN - HYDROcodone-acetaminophen (NORCO) 10-325 MG per tablet; Take 0.5-1 tablets by mouth every 8 (eight) hours as needed.   Call the office with any questions or concerns. Recheck in 1 month and sooner as neeeded

## 2014-05-26 NOTE — Patient Instructions (Signed)

## 2014-06-19 ENCOUNTER — Telehealth: Payer: Self-pay | Admitting: Internal Medicine

## 2014-06-19 ENCOUNTER — Other Ambulatory Visit: Payer: Self-pay

## 2014-06-19 DIAGNOSIS — Z1231 Encounter for screening mammogram for malignant neoplasm of breast: Secondary | ICD-10-CM

## 2014-06-19 NOTE — Telephone Encounter (Signed)
Too early, not due till 06/26/14

## 2014-06-19 NOTE — Telephone Encounter (Signed)
Pt is requesting re-fill on HYDROcodone-acetaminophen (NORCO) 10-325 MG per tablet

## 2014-06-22 MED ORDER — HYDROCODONE-ACETAMINOPHEN 10-325 MG PO TABS: 0.5000 | ORAL_TABLET | Freq: Three times a day (TID) | ORAL | Status: DC | PRN

## 2014-06-22 NOTE — Telephone Encounter (Signed)
Per Dr Leanne Chang, pt can pick up on Monday 06/26/14, pt aware

## 2014-06-27 ENCOUNTER — Ambulatory Visit (INDEPENDENT_AMBULATORY_CARE_PROVIDER_SITE_OTHER): Payer: Medicare Other | Admitting: Family

## 2014-06-27 ENCOUNTER — Encounter: Payer: Self-pay | Admitting: Family

## 2014-06-27 VITALS — BP 128/60 | Temp 97.9°F | Ht 59.0 in | Wt 130.0 lb

## 2014-06-27 DIAGNOSIS — K5730 Diverticulosis of large intestine without perforation or abscess without bleeding: Secondary | ICD-10-CM

## 2014-06-27 DIAGNOSIS — M549 Dorsalgia, unspecified: Secondary | ICD-10-CM

## 2014-06-27 DIAGNOSIS — Z85038 Personal history of other malignant neoplasm of large intestine: Secondary | ICD-10-CM

## 2014-06-27 NOTE — Patient Instructions (Signed)

## 2014-06-27 NOTE — Progress Notes (Signed)
Pre visit review using our clinic review tool, if applicable. No additional management support is needed unless otherwise documented below in the visit note. 

## 2014-06-27 NOTE — Progress Notes (Signed)
Subjective:    Patient ID: [name] Barker, female    DOB: December 11, 1921, 80 y.o.   MRN: [id number]  Back Pain   80 y.o. White female presents for medication refill of [place] 10-325 and chief complaint of back pain. Pt has chronic back pain and history of adenocarcinoma. Pt states her pain is a 6 during the day and a 10 at night on a scale of 0-10; the pain is her entire back; it does not radiate; it is an aching and throbbing pain; the pain is worse at night; taking NORCO helps pain, and movement helps with pain. Pt is accompanied by her husband who request an increase in pt pain medicine. Pt denies fever, chills, SOB and change in appetite.     Review of Systems  Constitutional: Positive for activity change and fatigue.  HENT: Negative.   Eyes: Negative.   Respiratory: Negative.   Cardiovascular: Negative.   Gastrointestinal: Negative.   Endocrine: Negative.   Musculoskeletal: Positive for back pain.  Skin: Negative.   Allergic/Immunologic: Negative.   Neurological: Negative.   Hematological: Negative.   Psychiatric/Behavioral: Negative.    Past Medical History  Diagnosis Date  . Colon cancer 08/19/2007    1994 T2, 2008 T3, N1  . ANEMIA-NOS 11/17/2006  . CAD 06/21/2010  . DIVERTICULOSIS-COLON 05/30/2010  . GERD 11/17/2006  . HYPERLIPIDEMIA 11/17/2006  . LOW BACK PAIN, CHRONIC 02/04/2010  . Osteopenia 11/17/2006  . Allergic rhinitis   . Small bowel obstruction   . Hemorrhoids   . TIA (transient ischemic attack)     History   Social History  . Marital Status: Married    Spouse Name: N/A    Number of Children: 6  . Years of Education: N/A   Occupational History  . Not on file.   Social History Main Topics  . Smoking status: Never Smoker   . Smokeless tobacco: Never Used  . Alcohol Use: No  . Drug Use: No  . Sexual Activity: Not on file     Comment: not asked   Other Topics Concern  . Not on file   Social History Narrative  . No narrative on file    Past  Surgical History  Procedure Laterality Date  . Appendectomy    . Back surgery  2007  . Hemicolectomy  1994    Right  . Abdominal hysterectomy    . Joint replacement    . Shoulder surgery    . Knee arthroscopy    . Partial colectomy  2008  . Rotator cuff repair    . Vaginal prolapse repair    . Cataract extraction    . Belpharoptosis repair      Family History  Problem Relation Age of Onset  . Lung cancer Sister   . Colon cancer Sister 6  . Kidney cancer Sister     Allergies  Allergen Reactions  . Methenamine Mandelate     REACTION: burning sensation  . Morphine Sulfate     REACTION: blister  . Penicillins     REACTION: urticaria (hives)  . Promethazine Hcl     REACTION: hallucinations  . Statins Other (See Comments)    Muscle pain     Current Outpatient Prescriptions on File Prior to Visit  Medication Sig Dispense Refill  . albuterol (PROVENTIL HFA;VENTOLIN HFA) 108 (90 BASE) MCG/ACT inhaler Inhale 2 puffs into the lungs 2 (two) times daily as needed. For wheezing  1 Inhaler  4  . aspirin 81 MG tablet  Take 162 mg by mouth daily.      . Cholecalciferol (VITAMIN D) 2000 UNITS tablet Take 2,000 Units by mouth daily.       [name] Barker Calcium (STOOL SOFTENER PO) Take 3 tablets by mouth every evening.       Marland Kitchen glycerin adult (GLYCERIN ADULT) 2 G SUPP Place 1 suppository rectally daily.       Marland Kitchen HYDROcodone-acetaminophen (NORCO) 10-325 MG per tablet Take 0.5-1 tablets by mouth every 8 (eight) hours as needed.  60 tablet  0  . lansoprazole (PREVACID) 15 MG capsule Take 15 mg by mouth daily.        Marland Kitchen loratadine (CLARITIN) 10 MG tablet Take 10 mg by mouth daily.      . NON FORMULARY OMEGA XL TAKES 2 IN AM AND 2 IN PM      . polyethylene glycol (MIRALAX / GLYCOLAX) packet Take 17 g by mouth at bedtime.       Marland Kitchen PREMARIN vaginal cream Place 0.5 g vaginally. Monday, Wednesday, Friday      . Probiotic Product (ALIGN) 4 MG CAPS Take 1 capsule by mouth daily.       Marland Kitchen senna-docusate  (SENOKOT-S) 8.6-50 MG per tablet Take 1 tablet by mouth every evening.      . sodium chloride (MURO 128) 5 % ophthalmic ointment Place 1 drop into both eyes daily.      Marland Kitchen triamcinolone (KENALOG) 0.025 % ointment Apply 1 application topically 2 (two) times daily.      . VOLTAREN 1 % GEL APPLY TO AFFECTED AREA EVERY DAY AS NEEDED BACK PAIN  400 g  5   No current facility-administered medications on file prior to visit.    BP 128/60  Temp(Src) 97.9 F (36.6 C) (Oral)  Ht 4\' 11"  (1.499 m)  Wt 130 lb (58.968 kg)  BMI 26.24 kg/m2chart    Objective:   Physical Exam  Constitutional: She is oriented to person, place, and time. She appears well-developed and well-nourished. She is active.  Cardiovascular: Normal rate, regular rhythm, normal heart sounds and normal pulses.   Pulmonary/Chest: Effort normal and breath sounds normal.  Abdominal: Soft. Normal appearance and bowel sounds are normal.  Musculoskeletal:  Kyphosis present   Neurological: She is alert and oriented to person, place, and time.  Skin: Skin is warm, dry and intact.  Psychiatric: She has a normal mood and affect. Her speech is normal and behavior is normal.          Assessment & Plan:  [name] Barker was seen today for back pain.  Diagnoses and associated orders for this visit:  Adenocarcinoma - CT Lumbar Spine Wo Contrast; Future  Diverticulosis of colon without hemorrhage  Back pain, unspecified location - CT Lumbar Spine Wo Contrast; Future   Education: Medications discussed in detail with patient including side effects.   Follow up: as needed pending results

## 2014-06-28 ENCOUNTER — Encounter: Payer: Self-pay | Admitting: Family

## 2014-06-29 ENCOUNTER — Ambulatory Visit (INDEPENDENT_AMBULATORY_CARE_PROVIDER_SITE_OTHER)
Admission: RE | Admit: 2014-06-29 | Discharge: 2014-06-29 | Disposition: A | Discharge status: Home / Self Care | Payer: Medicare Other | Source: Ambulatory Visit | Attending: Family | Admitting: Family

## 2014-06-29 DIAGNOSIS — M47817 Spondylosis without myelopathy or radiculopathy, lumbosacral region: Secondary | ICD-10-CM | POA: Diagnosis not present

## 2014-06-29 DIAGNOSIS — M549 Dorsalgia, unspecified: Secondary | ICD-10-CM | POA: Diagnosis not present

## 2014-06-29 DIAGNOSIS — M5137 Other intervertebral disc degeneration, lumbosacral region: Secondary | ICD-10-CM | POA: Diagnosis not present

## 2014-07-03 ENCOUNTER — Other Ambulatory Visit: Payer: Self-pay | Admitting: Family

## 2014-07-03 ENCOUNTER — Other Ambulatory Visit: Payer: Self-pay

## 2014-07-03 DIAGNOSIS — M479 Spondylosis, unspecified: Secondary | ICD-10-CM

## 2014-07-03 DIAGNOSIS — M47816 Spondylosis without myelopathy or radiculopathy, lumbar region: Secondary | ICD-10-CM

## 2014-07-13 ENCOUNTER — Telehealth: Payer: Self-pay | Admitting: Internal Medicine

## 2014-07-13 NOTE — Telephone Encounter (Signed)
Pt's husband calling on behalf of pt to state they received a call from [facility] asking for office visit notes dated 06/14/14 from previous pain clinic patient was referred to.  Husband states the patient was never referred to or seen at another pain clinic this year.  Pt requesting we call [facility] as they will not schedule the pt until they get ov notes.  Betty's call back number is [phone].

## 2014-07-14 NOTE — Telephone Encounter (Signed)
I  Spoke with [name] Barker she wanted notes from the previous pain clinic that patient was seen at. Called [name] Barker , [name] Barker states she has not seen anyone since 5 years ago and she is not able to get those records and [name] Barker stated that her notes indicated she was seen  By a pain clinic on 6-17 i did not see those notes in epic. I called and informed  Patient what was told to me she said that was not true and husband stated   He wanted her referral to go to [facility] instead so per [name] Barker request and husband request sent to      Dr [facility] instead  I sent referral to [facility]: Felecia Jan MD  Pain Management Physician  Address: [id number], [place], [place] [address]  [phone] Faxed ov notes and referral awaiting appt

## 2014-07-17 ENCOUNTER — Telehealth: Payer: Self-pay | Admitting: Internal Medicine

## 2014-07-17 MED ORDER — HYDROCODONE-ACETAMINOPHEN 10-325 MG PO TABS: 0.5000 | ORAL_TABLET | Freq: Two times a day (BID) | ORAL | Status: DC | PRN

## 2014-07-17 NOTE — Telephone Encounter (Signed)
May be filled. In the future, only every 30 days.

## 2014-07-17 NOTE — Telephone Encounter (Signed)
Pt req rx on HYDROcodone-acetaminophen (NORCO) 10-325 MG per tablet

## 2014-07-17 NOTE — Telephone Encounter (Signed)
Filled by Dr. Leanne Chang 06/22/14 for #60. Ok to fill?

## 2014-07-18 NOTE — Telephone Encounter (Signed)
Pt's husband aware...

## 2014-07-19 ENCOUNTER — Telehealth: Payer: Self-pay | Admitting: Family

## 2014-07-19 NOTE — Telephone Encounter (Signed)
Pt's husband is calling in regards to the referral information, pt states that the referral for [facility] informed them that the new pt appt schedules are not until October, pt is needing appt before then , pt would like to know if you can do the referral for pain management in [place] [phone], ask for betty. Husband states that betty informed him that the pt had a pain management meeting on 7/16 pt does not know anything about that and has not seen anyone at pain management yet. Pt has medicare/bcbs.

## 2014-07-19 NOTE — Telephone Encounter (Signed)
Spoke with patient husband informed him that the patient will have a long wait to get into the pain clinc if we send this referral to another office , they will have to review and the wait may be longer , I advise the patient husband to keep the patient scheduled appt time with dr [facility]   , he stated he would, and I advised patient that they have cancellation daily and they may can get her in sooner , pt husband is ok with this and will keep patient appt with [facility]

## 2014-07-21 ENCOUNTER — Other Ambulatory Visit: Payer: Self-pay

## 2014-07-21 MED ORDER — ALBUTEROL SULFATE HFA 108 (90 BASE) MCG/ACT IN AERS: 2.0000 | INHALATION_SPRAY | Freq: Two times a day (BID) | RESPIRATORY_TRACT | Status: DC | PRN

## 2014-07-24 ENCOUNTER — Telehealth: Payer: Self-pay | Admitting: Family

## 2014-07-24 DIAGNOSIS — M545 Low back pain, unspecified: Secondary | ICD-10-CM

## 2014-07-24 DIAGNOSIS — G8929 Other chronic pain: Secondary | ICD-10-CM

## 2014-07-24 NOTE — Telephone Encounter (Signed)
Pt's husband states pt need a new tens unit, states [facility] informed her to contact her pcp to get a order. Fax to  [phone]. The item needed is a logistim unit. Husband states if padonda needs to see her again he will bring her in.

## 2014-07-25 NOTE — Telephone Encounter (Signed)
Order faxed.

## 2014-07-27 ENCOUNTER — Ambulatory Visit
Admission: RE | Admit: 2014-07-27 | Discharge: 2014-07-27 | Disposition: A | Discharge status: Home / Self Care | Payer: Medicare Other | Source: Ambulatory Visit

## 2014-07-27 DIAGNOSIS — Z1231 Encounter for screening mammogram for malignant neoplasm of breast: Secondary | ICD-10-CM | POA: Diagnosis not present

## 2014-08-01 DIAGNOSIS — I781 Nevus, non-neoplastic: Secondary | ICD-10-CM | POA: Diagnosis not present

## 2014-08-01 DIAGNOSIS — L821 Other seborrheic keratosis: Secondary | ICD-10-CM | POA: Diagnosis not present

## 2014-08-11 ENCOUNTER — Telehealth: Payer: Self-pay | Admitting: Family

## 2014-08-11 DIAGNOSIS — IMO0002 Reserved for concepts with insufficient information to code with codable children: Secondary | ICD-10-CM

## 2014-08-11 NOTE — Telephone Encounter (Signed)
Order faxed.

## 2014-08-11 NOTE — Telephone Encounter (Signed)
Correct fax # [phone]

## 2014-08-11 NOTE — Telephone Encounter (Signed)
[employer] (f) [phone] (p) [phone] Pt needs power lift chair Rx sent to the above location

## 2014-08-21 ENCOUNTER — Telehealth: Payer: Self-pay | Admitting: Family

## 2014-08-21 MED ORDER — HYDROCODONE-ACETAMINOPHEN 10-325 MG PO TABS: 0.5000 | ORAL_TABLET | Freq: Two times a day (BID) | ORAL | Status: DC | PRN

## 2014-08-21 NOTE — Telephone Encounter (Signed)
Pt needs new rx hydrocodone °

## 2014-08-21 NOTE — Telephone Encounter (Signed)
Pt's husband aware Rx ready for pick up

## 2014-08-29 ENCOUNTER — Ambulatory Visit (INDEPENDENT_AMBULATORY_CARE_PROVIDER_SITE_OTHER): Payer: Medicare Other | Admitting: Family

## 2014-08-29 ENCOUNTER — Encounter: Payer: Self-pay | Admitting: Family

## 2014-08-29 VITALS — BP 122/74 | HR 60 | Ht 59.0 in | Wt 128.0 lb

## 2014-08-29 DIAGNOSIS — M545 Low back pain, unspecified: Secondary | ICD-10-CM

## 2014-08-29 DIAGNOSIS — G8929 Other chronic pain: Secondary | ICD-10-CM | POA: Diagnosis not present

## 2014-08-29 MED ORDER — CYCLOBENZAPRINE HCL 5 MG PO TABS
5.0000 mg | ORAL_TABLET | Freq: Every day | ORAL | Status: DC
Start: 2014-08-29 — End: 2014-09-25

## 2014-08-29 MED ORDER — HYDROCODONE-ACETAMINOPHEN 5-325 MG PO TABS: 0.5000 | ORAL_TABLET | Freq: Four times a day (QID) | ORAL | Status: DC | PRN

## 2014-08-29 NOTE — Progress Notes (Signed)
Pre visit review using our clinic review tool, if applicable. No additional management support is needed unless otherwise documented below in the visit note. 

## 2014-08-29 NOTE — Patient Instructions (Signed)
Back Exercises These exercises may help you when beginning to rehabilitate your injury. Your symptoms may resolve with or without further involvement from your physician, physical therapist or athletic trainer. While completing these exercises, remember:   Restoring tissue flexibility helps normal motion to return to the joints. This allows healthier, less painful movement and activity.  An effective stretch should be held for at least 30 seconds.  A stretch should never be painful. You should only feel a gentle lengthening or release in the stretched tissue. STRETCH - Extension, Prone on Elbows   Lie on your stomach on the floor, a bed will be too soft. Place your palms about shoulder width apart and at the height of your head.  Place your elbows under your shoulders. If this is too painful, stack pillows under your chest.  Allow your body to relax so that your hips drop lower and make contact more completely with the floor.  Hold this position for __________ seconds.  Slowly return to lying flat on the floor. Repeat __________ times. Complete this exercise __________ times per day.  RANGE OF MOTION - Extension, Prone Press Ups   Lie on your stomach on the floor, a bed will be too soft. Place your palms about shoulder width apart and at the height of your head.  Keeping your back as relaxed as possible, slowly straighten your elbows while keeping your hips on the floor. You may adjust the placement of your hands to maximize your comfort. As you gain motion, your hands will come more underneath your shoulders.  Hold this position __________ seconds.  Slowly return to lying flat on the floor. Repeat __________ times. Complete this exercise __________ times per day.  RANGE OF MOTION- Quadruped, Neutral Spine   Assume a hands and knees position on a firm surface. Keep your hands under your shoulders and your knees under your hips. You may place padding under your knees for  comfort.  Drop your head and point your tail bone toward the ground below you. This will round out your low back like an angry cat. Hold this position for __________ seconds.  Slowly lift your head and release your tail bone so that your back sags into a large arch, like an old horse.  Hold this position for __________ seconds.  Repeat this until you feel limber in your low back.  Now, find your "sweet spot." This will be the most comfortable position somewhere between the two previous positions. This is your neutral spine. Once you have found this position, tense your stomach muscles to support your low back.  Hold this position for __________ seconds. Repeat __________ times. Complete this exercise __________ times per day.  STRETCH - Flexion, Single Knee to Chest   Lie on a firm bed or floor with both legs extended in front of you.  Keeping one leg in contact with the floor, bring your opposite knee to your chest. Hold your leg in place by either grabbing behind your thigh or at your knee.  Pull until you feel a gentle stretch in your low back. Hold __________ seconds.  Slowly release your grasp and repeat the exercise with the opposite side. Repeat __________ times. Complete this exercise __________ times per day.  STRETCH - Hamstrings, Standing  Stand or sit and extend your right / left leg, placing your foot on a chair or foot stool  Keeping a slight arch in your low back and your hips straight forward.  Lead with your chest and   lean forward at the waist until you feel a gentle stretch in the back of your right / left knee or thigh. (When done correctly, this exercise requires leaning only a small distance.)  Hold this position for __________ seconds. Repeat __________ times. Complete this stretch __________ times per day. STRENGTHENING - Deep Abdominals, Pelvic Tilt   Lie on a firm bed or floor. Keeping your legs in front of you, bend your knees so they are both pointed  toward the ceiling and your feet are flat on the floor.  Tense your lower abdominal muscles to press your low back into the floor. This motion will rotate your pelvis so that your tail bone is scooping upwards rather than pointing at your feet or into the floor.  With a gentle tension and even breathing, hold this position for __________ seconds. Repeat __________ times. Complete this exercise __________ times per day.  STRENGTHENING - Abdominals, Crunches   Lie on a firm bed or floor. Keeping your legs in front of you, bend your knees so they are both pointed toward the ceiling and your feet are flat on the floor. Cross your arms over your chest.  Slightly tip your chin down without bending your neck.  Tense your abdominals and slowly lift your trunk high enough to just clear your shoulder blades. Lifting higher can put excessive stress on the low back and does not further strengthen your abdominal muscles.  Control your return to the starting position. Repeat __________ times. Complete this exercise __________ times per day.  STRENGTHENING - Quadruped, Opposite UE/LE Lift   Assume a hands and knees position on a firm surface. Keep your hands under your shoulders and your knees under your hips. You may place padding under your knees for comfort.  Find your neutral spine and gently tense your abdominal muscles so that you can maintain this position. Your shoulders and hips should form a rectangle that is parallel with the floor and is not twisted.  Keeping your trunk steady, lift your right hand no higher than your shoulder and then your left leg no higher than your hip. Make sure you are not holding your breath. Hold this position __________ seconds.  Continuing to keep your abdominal muscles tense and your back steady, slowly return to your starting position. Repeat with the opposite arm and leg. Repeat __________ times. Complete this exercise __________ times per day. Document Released:  01/02/2006 Document Revised: 03/08/2012 Document Reviewed: 03/29/2009 Exit[employer] Patient Information 2015 [employer]. This information is not intended to replace advice given to you by your health care provider. Make sure you discuss any questions you have with your health care provider.  

## 2014-08-30 ENCOUNTER — Encounter: Payer: Self-pay | Admitting: Family

## 2014-08-30 NOTE — Progress Notes (Signed)
Subjective:    Patient ID: [name] Barker, female    DOB: 1921-10-09, 80 y.o.   MRN: [id number]  HPI 80 year old female, nonsmoker is in today requesting a reduction in the milligram strength of her pain medication. At her last office visit she requested an increase. Therefore, we increased hydrocodone 10/325mg  for which she's been taking 1/4 of a tablet every 4-6 hours. Pain has significantly decreased in her back since he does sleep in a recliner. Continues to have an achy low back pain that she rates as 6/10. Overall, it has improved. Is requesting to decrease her medication back down to hydrocodone 5/325mg  1/2 tablet every 6 hours. Reports having muscle tension at bedtime that often ,makes it difficult to fall asleep. Overall, she wants to decrease her hydrocodone intake to none.    Review of Systems  Constitutional: Negative.   HENT: Negative.   Respiratory: Negative.   Cardiovascular: Negative.   Gastrointestinal: Negative.   Endocrine: Negative.   Genitourinary: Negative.   Musculoskeletal: Positive for back pain.  Skin: Negative.   Allergic/Immunologic: Negative.   Neurological: Negative.   Psychiatric/Behavioral: Negative.    Past Medical History  Diagnosis Date  . Colon cancer 08/19/2007    1994 T2, 2008 T3, N1  . ANEMIA-NOS 11/17/2006  . CAD 06/21/2010  . DIVERTICULOSIS-COLON 05/30/2010  . GERD 11/17/2006  . HYPERLIPIDEMIA 11/17/2006  . LOW BACK PAIN, CHRONIC 02/04/2010  . Osteopenia 11/17/2006  . Allergic rhinitis   . Small bowel obstruction   . Hemorrhoids   . TIA (transient ischemic attack)     History   Social History  . Marital Status: Married    Spouse Name: N/A    Number of Children: 6  . Years of Education: N/A   Occupational History  . Not on file.   Social History Main Topics  . Smoking status: Never Smoker   . Smokeless tobacco: Never Used  . Alcohol Use: No  . Drug Use: No  . Sexual Activity: Not on file     Comment: not asked   Other  Topics Concern  . Not on file   Social History Narrative  . No narrative on file    Past Surgical History  Procedure Laterality Date  . Appendectomy    . Back surgery  2007  . Hemicolectomy  1994    Right  . Abdominal hysterectomy    . Joint replacement    . Shoulder surgery    . Knee arthroscopy    . Partial colectomy  2008  . Rotator cuff repair    . Vaginal prolapse repair    . Cataract extraction    . Belpharoptosis repair      Family History  Problem Relation Age of Onset  . Lung cancer Sister   . Colon cancer Sister 5  . Kidney cancer Sister     Allergies  Allergen Reactions  . Methenamine Mandelate     REACTION: burning sensation  . Morphine Sulfate     REACTION: blister  . Penicillins     REACTION: urticaria (hives)  . Promethazine Hcl     REACTION: hallucinations  . Statins Other (See Comments)    Muscle pain     Current Outpatient Prescriptions on File Prior to Visit  Medication Sig Dispense Refill  . albuterol (PROVENTIL HFA;VENTOLIN HFA) 108 (90 BASE) MCG/ACT inhaler Inhale 2 puffs into the lungs 2 (two) times daily as needed. For wheezing  1 Inhaler  4  . aspirin 81 MG  tablet Take 162 mg by mouth daily.      . Cholecalciferol (VITAMIN D) 2000 UNITS tablet Take 2,000 Units by mouth daily.       [name] Calcium (STOOL SOFTENER PO) Take 3 tablets by mouth every evening.       Marland Kitchen glycerin adult (GLYCERIN ADULT) 2 G SUPP Place 1 suppository rectally daily.       . lansoprazole (PREVACID) 15 MG capsule Take 15 mg by mouth daily.        Marland Kitchen loratadine (CLARITIN) 10 MG tablet Take 10 mg by mouth daily.      . NON FORMULARY OMEGA XL TAKES 2 IN AM AND 2 IN PM      . polyethylene glycol (MIRALAX / GLYCOLAX) packet Take 17 g by mouth at bedtime.       Marland Kitchen PREMARIN vaginal cream Place 0.5 g vaginally. Monday, Wednesday, Friday      . Probiotic Product (ALIGN) 4 MG CAPS Take 1 capsule by mouth daily.       Marland Kitchen senna-docusate (SENOKOT-S) 8.6-50 MG per tablet Take  1 tablet by mouth every evening.      . sodium chloride (MURO 128) 5 % ophthalmic ointment Place 1 drop into both eyes daily.      Marland Kitchen triamcinolone (KENALOG) 0.025 % ointment Apply 1 application topically 2 (two) times daily.      . VOLTAREN 1 % GEL APPLY TO AFFECTED AREA EVERY DAY AS NEEDED BACK PAIN  400 g  5   No current facility-administered medications on file prior to visit.    BP 122/74  Pulse 60  Ht 4\' 11"  (1.499 m)  Wt 128 lb (58.06 kg)  BMI 25.84 kg/m2chart     Objective:   Physical Exam  Constitutional: She is oriented to person, place, and time. She appears well-developed and well-nourished.  HENT:  Right Ear: External ear normal.  Left Ear: External ear normal.  Nose: Nose normal.  Mouth/Throat: Oropharynx is clear and moist.  Neck: Normal range of motion. Neck supple.  Cardiovascular: Normal rate, regular rhythm and normal heart sounds.   Pulmonary/Chest: Effort normal and breath sounds normal.  Abdominal: Soft. Bowel sounds are normal.  Musculoskeletal: Normal range of motion.  Neurological: She is alert and oriented to person, place, and time.  Skin: Skin is warm and dry.  Psychiatric: She has a normal mood and affect.          Assessment & Plan:  Natoya was seen today for medication problem.  Diagnoses and associated orders for this visit:  Chronic low back pain  Other Orders - HYDROcodone-acetaminophen (NORCO/VICODIN) 5-325 MG per tablet; Take 0.5 tablets by mouth every 6 (six) hours as needed for moderate pain. - cyclobenzaprine (FLEXERIL) 5 MG tablet; Take 1 tablet (5 mg total) by mouth at bedtime.   Recheck in 3 weeks to see how she is doing with medication adjustment.

## 2014-09-19 ENCOUNTER — Telehealth: Payer: Self-pay | Admitting: Family

## 2014-09-19 NOTE — Telephone Encounter (Signed)
Cannot fill until appointment 09/25/14

## 2014-09-19 NOTE — Telephone Encounter (Signed)
Pain management referral was placed for pt on 07/03/14. Message routed to [place] to check status of appointment

## 2014-09-19 NOTE — Telephone Encounter (Signed)
The referral is still in review  appointment has not been scheduled yet once review they will contact the pt to schedule  Per tiffiany @[facility]: Felecia Jan MD  Pain Management Physician  Address: [id number], [place], [place] [address]  [phone]

## 2014-09-19 NOTE — Telephone Encounter (Signed)
Pt needs re-fill on HYDROcodone-acetaminophen (NORCO/VICODIN) 5-325 MG per tablet ° °

## 2014-09-20 NOTE — Telephone Encounter (Signed)
Called pharmacy. Norco last filled 08/24/14 for #60. Dosed at .5tab q6hrs prn, Rx should last 30 days. Pt has appointment 09/25/14. Ok to fill early?

## 2014-09-20 NOTE — Telephone Encounter (Signed)
Pt and husband are aware. °

## 2014-09-20 NOTE — Telephone Encounter (Signed)
Not ok to fill early

## 2014-09-25 ENCOUNTER — Ambulatory Visit (INDEPENDENT_AMBULATORY_CARE_PROVIDER_SITE_OTHER): Payer: Medicare Other | Admitting: Family

## 2014-09-25 ENCOUNTER — Encounter: Payer: Self-pay | Admitting: Family

## 2014-09-25 VITALS — BP 126/78 | HR 73 | Ht 59.0 in | Wt 125.7 lb

## 2014-09-25 DIAGNOSIS — D518 Other vitamin B12 deficiency anemias: Secondary | ICD-10-CM | POA: Diagnosis not present

## 2014-09-25 DIAGNOSIS — R5383 Other fatigue: Principal | ICD-10-CM

## 2014-09-25 DIAGNOSIS — D519 Vitamin B12 deficiency anemia, unspecified: Secondary | ICD-10-CM

## 2014-09-25 DIAGNOSIS — R5381 Other malaise: Secondary | ICD-10-CM

## 2014-09-25 MED ORDER — HYDROCODONE-ACETAMINOPHEN 5-325 MG PO TABS: 0.5000 | ORAL_TABLET | Freq: Four times a day (QID) | ORAL | Status: DC | PRN

## 2014-09-25 NOTE — Progress Notes (Signed)
Subjective:    Patient ID: [name] Barker, female    DOB: 1921-06-08, 80 y.o.   MRN: [id number]  HPI 80 year old white female, nonsmoker is in today with complaints of fatigue ongoing for several weeks. Denies any other symptoms. She lives a pretty sedentary lifestyle and her husband does most of the housework. That is normal. She has a history of colon cancer. Denies any blood in her stools are dark black stools. She had blood work done last month and all her labs were stable.   Review of Systems  Constitutional: Positive for fatigue. Negative for fever and unexpected weight change.  HENT: Negative.   Respiratory: Negative.   Cardiovascular: Negative.   Gastrointestinal: Negative.   Endocrine: Negative.   Genitourinary: Negative.   Musculoskeletal: Negative.   Skin: Negative.   Allergic/Immunologic: Negative.   Hematological: Negative.   Psychiatric/Behavioral: Negative.    Past Medical History  Diagnosis Date  . Colon cancer 08/19/2007    1994 T2, 2008 T3, N1  . ANEMIA-NOS 11/17/2006  . CAD 06/21/2010  . DIVERTICULOSIS-COLON 05/30/2010  . GERD 11/17/2006  . HYPERLIPIDEMIA 11/17/2006  . LOW BACK PAIN, CHRONIC 02/04/2010  . Osteopenia 11/17/2006  . Allergic rhinitis   . Small bowel obstruction   . Hemorrhoids   . TIA (transient ischemic attack)     History   Social History  . Marital Status: Married    Spouse Name: N/A    Number of Children: 6  . Years of Education: N/A   Occupational History  . Not on file.   Social History Main Topics  . Smoking status: Never Smoker   . Smokeless tobacco: Never Used  . Alcohol Use: No  . Drug Use: No  . Sexual Activity: Not on file     Comment: not asked   Other Topics Concern  . Not on file   Social History Narrative  . No narrative on file    Past Surgical History  Procedure Laterality Date  . Appendectomy    . Back surgery  2007  . Hemicolectomy  1994    Right  . Abdominal hysterectomy    . Joint  replacement    . Shoulder surgery    . Knee arthroscopy    . Partial colectomy  2008  . Rotator cuff repair    . Vaginal prolapse repair    . Cataract extraction    . Belpharoptosis repair      Family History  Problem Relation Age of Onset  . Lung cancer Sister   . Colon cancer Sister 81  . Kidney cancer Sister     Allergies  Allergen Reactions  . Flexeril [Cyclobenzaprine]     nightmares  . Methenamine Mandelate     REACTION: burning sensation  . Morphine Sulfate     REACTION: blister  . Penicillins     REACTION: urticaria (hives)  . Promethazine Hcl     REACTION: hallucinations  . Statins Other (See Comments)    Muscle pain     Current Outpatient Prescriptions on File Prior to Visit  Medication Sig Dispense Refill  . albuterol (PROVENTIL HFA;VENTOLIN HFA) 108 (90 BASE) MCG/ACT inhaler Inhale 2 puffs into the lungs 2 (two) times daily as needed. For wheezing  1 Inhaler  4  . aspirin 81 MG tablet Take 162 mg by mouth daily.      . Cholecalciferol (VITAMIN D) 2000 UNITS tablet Take 2,000 Units by mouth daily.       . Docusate Calcium (  STOOL SOFTENER PO) Take 3 tablets by mouth every evening.       Marland Kitchen glycerin adult (GLYCERIN ADULT) 2 G SUPP Place 1 suppository rectally daily.       . lansoprazole (PREVACID) 15 MG capsule Take 15 mg by mouth daily.        Marland Kitchen loratadine (CLARITIN) 10 MG tablet Take 10 mg by mouth daily.      . NON FORMULARY OMEGA XL TAKES 2 IN AM AND 2 IN PM      . polyethylene glycol (MIRALAX / GLYCOLAX) packet Take 17 g by mouth at bedtime.       Marland Kitchen PREMARIN vaginal cream Place 0.5 g vaginally. Monday, Wednesday, Friday      . Probiotic Product (ALIGN) 4 MG CAPS Take 1 capsule by mouth daily.       Marland Kitchen senna-docusate (SENOKOT-S) 8.6-50 MG per tablet Take 1 tablet by mouth every evening.      . sodium chloride (MURO 128) 5 % ophthalmic ointment Place 1 drop into both eyes daily.      Marland Kitchen triamcinolone (KENALOG) 0.025 % ointment Apply 1 application topically 2  (two) times daily.      . VOLTAREN 1 % GEL APPLY TO AFFECTED AREA EVERY DAY AS NEEDED BACK PAIN  400 g  5   No current facility-administered medications on file prior to visit.    BP 126/78  Pulse 73  Ht 4\' 11"  (1.499 m)  Wt 125 lb 11.2 oz (57.017 kg)  BMI 25.37 kg/m2chart    Objective:   Physical Exam  Constitutional: She is oriented to person, place, and time. She appears well-developed. No distress.  HENT:  Right Ear: External ear normal.  Left Ear: External ear normal.  Nose: Nose normal.  Mouth/Throat: Oropharynx is clear and moist.  Neck: Normal range of motion. Neck supple.  Cardiovascular: Normal rate, regular rhythm and normal heart sounds.   Pulmonary/Chest: Effort normal and breath sounds normal.  Abdominal: Soft. Bowel sounds are normal.  Musculoskeletal: Normal range of motion.  Neurological: She is alert and oriented to person, place, and time.  Skin: Skin is warm and dry.  Psychiatric: She has a normal mood and affect.          Assessment & Plan:  [name] Barker was seen today for fatigue.  Diagnoses and associated orders for this visit:  Other malaise and fatigue  B12 deficiency anemia - Vitamin B12  Other Orders - Discontinue: HYDROcodone-acetaminophen (NORCO/VICODIN) 5-325 MG per tablet; Take 0.5 tablets by mouth every 6 (six) hours as needed for moderate pain. - HYDROcodone-acetaminophen (NORCO/VICODIN) 5-325 MG per tablet; Take 0.5 tablets by mouth every 6 (six) hours as needed for moderate pain.    call the office with any questions or concerns. Recheck pending labs and sooner as needed.

## 2014-09-25 NOTE — Patient Instructions (Signed)

## 2014-09-25 NOTE — Progress Notes (Signed)
Pre visit review using our clinic review tool, if applicable. No additional management support is needed unless otherwise documented below in the visit note. 

## 2014-09-26 LAB — VITAMIN B12: Vitamin B-12: 334 pg/mL (ref 211–911)

## 2014-09-30 DIAGNOSIS — Z23 Encounter for immunization: Secondary | ICD-10-CM | POA: Diagnosis not present

## 2014-10-31 DIAGNOSIS — M4608 Spinal enthesopathy, sacral and sacrococcygeal region: Secondary | ICD-10-CM | POA: Diagnosis not present

## 2014-10-31 DIAGNOSIS — G894 Chronic pain syndrome: Secondary | ICD-10-CM | POA: Diagnosis not present

## 2014-10-31 DIAGNOSIS — R1030 Lower abdominal pain, unspecified: Secondary | ICD-10-CM | POA: Diagnosis not present

## 2014-10-31 DIAGNOSIS — Z79891 Long term (current) use of opiate analgesic: Secondary | ICD-10-CM | POA: Diagnosis not present

## 2014-11-30 DIAGNOSIS — M4608 Spinal enthesopathy, sacral and sacrococcygeal region: Secondary | ICD-10-CM | POA: Diagnosis not present

## 2014-11-30 DIAGNOSIS — R1030 Lower abdominal pain, unspecified: Secondary | ICD-10-CM | POA: Diagnosis not present

## 2014-11-30 DIAGNOSIS — G894 Chronic pain syndrome: Secondary | ICD-10-CM | POA: Diagnosis not present

## 2014-11-30 DIAGNOSIS — Z79891 Long term (current) use of opiate analgesic: Secondary | ICD-10-CM | POA: Diagnosis not present

## 2015-03-01 DIAGNOSIS — G894 Chronic pain syndrome: Secondary | ICD-10-CM | POA: Diagnosis not present

## 2015-03-01 DIAGNOSIS — R1030 Lower abdominal pain, unspecified: Secondary | ICD-10-CM | POA: Diagnosis not present

## 2015-03-01 DIAGNOSIS — Z79891 Long term (current) use of opiate analgesic: Secondary | ICD-10-CM | POA: Diagnosis not present

## 2015-03-01 DIAGNOSIS — M4608 Spinal enthesopathy, sacral and sacrococcygeal region: Secondary | ICD-10-CM | POA: Diagnosis not present

## 2015-03-20 DIAGNOSIS — M4608 Spinal enthesopathy, sacral and sacrococcygeal region: Secondary | ICD-10-CM | POA: Diagnosis not present

## 2015-03-20 DIAGNOSIS — Z79891 Long term (current) use of opiate analgesic: Secondary | ICD-10-CM | POA: Diagnosis not present

## 2015-03-20 DIAGNOSIS — R1030 Lower abdominal pain, unspecified: Secondary | ICD-10-CM | POA: Diagnosis not present

## 2015-03-20 DIAGNOSIS — G894 Chronic pain syndrome: Secondary | ICD-10-CM | POA: Diagnosis not present

## 2015-04-17 DIAGNOSIS — R3915 Urgency of urination: Secondary | ICD-10-CM | POA: Diagnosis not present

## 2015-05-21 DIAGNOSIS — H6123 Impacted cerumen, bilateral: Secondary | ICD-10-CM | POA: Diagnosis not present

## 2015-05-25 DIAGNOSIS — G894 Chronic pain syndrome: Secondary | ICD-10-CM | POA: Diagnosis not present

## 2015-05-25 DIAGNOSIS — R1030 Lower abdominal pain, unspecified: Secondary | ICD-10-CM | POA: Diagnosis not present

## 2015-05-25 DIAGNOSIS — Z79891 Long term (current) use of opiate analgesic: Secondary | ICD-10-CM | POA: Diagnosis not present

## 2015-05-25 DIAGNOSIS — M4608 Spinal enthesopathy, sacral and sacrococcygeal region: Secondary | ICD-10-CM | POA: Diagnosis not present

## 2015-06-01 ENCOUNTER — Ambulatory Visit: Payer: Medicare Other | Admitting: Internal Medicine

## 2015-06-05 ENCOUNTER — Encounter: Payer: Self-pay | Admitting: Gastroenterology

## 2015-06-07 ENCOUNTER — Encounter: Payer: Self-pay | Admitting: Internal Medicine

## 2015-06-07 ENCOUNTER — Ambulatory Visit (INDEPENDENT_AMBULATORY_CARE_PROVIDER_SITE_OTHER): Payer: Medicare Other | Admitting: Internal Medicine

## 2015-06-07 VITALS — BP 110/68 | HR 60 | Temp 97.8°F | Resp 14 | Ht 59.0 in | Wt 119.0 lb

## 2015-06-07 DIAGNOSIS — M545 Low back pain: Secondary | ICD-10-CM

## 2015-06-07 DIAGNOSIS — Z23 Encounter for immunization: Secondary | ICD-10-CM | POA: Diagnosis not present

## 2015-06-07 DIAGNOSIS — Z0000 Encounter for general adult medical examination without abnormal findings: Secondary | ICD-10-CM | POA: Diagnosis not present

## 2015-06-07 DIAGNOSIS — Z299 Encounter for prophylactic measures, unspecified: Secondary | ICD-10-CM

## 2015-06-07 NOTE — Patient Instructions (Signed)
You can try using the miralax 2-3 times per week to see if this helps you to stay more regular.   We have given you the pneumonia booster shot today.  Come back in about 6 months for a check up and if you have any problems or questions sooner please feel free to call the office.   It was a pleasure to meet you today.

## 2015-06-07 NOTE — Progress Notes (Signed)
Pre visit review using our clinic review tool, if applicable. No additional management support is needed unless otherwise documented below in the visit note. 

## 2015-06-08 ENCOUNTER — Encounter: Payer: Self-pay | Admitting: Internal Medicine

## 2015-06-08 DIAGNOSIS — Z0000 Encounter for general adult medical examination without abnormal findings: Secondary | ICD-10-CM | POA: Insufficient documentation

## 2015-06-08 NOTE — Assessment & Plan Note (Signed)
Uses vicodin for pain which was refilled. Advised to take miralax regularly as she is having some constipation from it. She does drink enough water and talked to her about fiber as well. This does control her pain and give her a better QOL.

## 2015-06-08 NOTE — Assessment & Plan Note (Signed)
Has aged out of some screening. Given prevnar to complete pneumonia series. Talked with her about home safety. She thinks she has advanced directives in place.

## 2015-06-08 NOTE — Progress Notes (Signed)
   Subjective:    Patient ID: [name] Barker, female    DOB: Sep 13, 1921, 81 y.o.   MRN: [id number]  HPI Here for medicare wellness, no new complaints. Please see A/P for status and treatment of chronic medical problems.   Diet: heart healthy Physical activity: sedentary Depression/mood screen: negative Hearing: intact to whispered voice Visual acuity: grossly normal, performs annual eye exam  ADLs: capable Fall risk: none Home safety: medium Cognitive evaluation: intact to orientation, naming, recall and repetition EOL planning: adv directives discussed  I have personally reviewed and have noted 1. The patient's medical and social history - reviewed today no changes 2. Their use of alcohol, tobacco or illicit drugs 3. Their current medications and supplements 4. The patient's functional ability including ADL's, fall risks, home safety risks and hearing or visual impairment. 5. Diet and physical activities 6. Evidence for depression or mood disorders 7. Care team reviewed and updated (available in snapshot)   Review of Systems    Objective:   Physical Exam Filed Vitals:   06/07/15 1402  BP: 110/68  Pulse: 60  Temp: 97.8 F (36.6 C)  TempSrc: Oral  Resp: 14  Height: 4\' 11"  (1.499 m)  Weight: 119 lb (53.978 kg)  SpO2: 94%      Assessment & Plan:  Prevnar given at visit.

## 2015-06-11 ENCOUNTER — Ambulatory Visit: Payer: Medicare Other | Admitting: Internal Medicine

## 2015-06-18 DIAGNOSIS — N898 Other specified noninflammatory disorders of vagina: Secondary | ICD-10-CM | POA: Diagnosis not present

## 2015-06-21 ENCOUNTER — Encounter (INDEPENDENT_AMBULATORY_CARE_PROVIDER_SITE_OTHER): Payer: Medicare Other | Admitting: Ophthalmology

## 2015-06-21 DIAGNOSIS — H35372 Puckering of macula, left eye: Secondary | ICD-10-CM

## 2015-06-21 DIAGNOSIS — H33301 Unspecified retinal break, right eye: Secondary | ICD-10-CM

## 2015-06-21 DIAGNOSIS — H43813 Vitreous degeneration, bilateral: Secondary | ICD-10-CM | POA: Diagnosis not present

## 2015-06-27 DIAGNOSIS — H1851 Endothelial corneal dystrophy: Secondary | ICD-10-CM | POA: Diagnosis not present

## 2015-06-27 DIAGNOSIS — Z961 Presence of intraocular lens: Secondary | ICD-10-CM | POA: Diagnosis not present

## 2015-06-29 ENCOUNTER — Other Ambulatory Visit: Payer: Self-pay

## 2015-06-29 DIAGNOSIS — Z1231 Encounter for screening mammogram for malignant neoplasm of breast: Secondary | ICD-10-CM

## 2015-07-05 ENCOUNTER — Other Ambulatory Visit: Payer: Self-pay | Admitting: Obstetrics and Gynecology

## 2015-07-05 DIAGNOSIS — N760 Acute vaginitis: Secondary | ICD-10-CM | POA: Diagnosis not present

## 2015-07-05 DIAGNOSIS — N898 Other specified noninflammatory disorders of vagina: Secondary | ICD-10-CM | POA: Diagnosis not present

## 2015-07-14 ENCOUNTER — Other Ambulatory Visit: Payer: Self-pay | Admitting: Family

## 2015-07-31 ENCOUNTER — Ambulatory Visit
Admission: RE | Admit: 2015-07-31 | Discharge: 2015-07-31 | Disposition: A | Discharge status: Home / Self Care | Payer: Medicare Other | Source: Ambulatory Visit

## 2015-07-31 DIAGNOSIS — Z1231 Encounter for screening mammogram for malignant neoplasm of breast: Secondary | ICD-10-CM | POA: Diagnosis not present

## 2015-08-01 ENCOUNTER — Other Ambulatory Visit: Payer: Self-pay | Admitting: Internal Medicine

## 2015-08-01 DIAGNOSIS — R928 Other abnormal and inconclusive findings on diagnostic imaging of breast: Secondary | ICD-10-CM

## 2015-08-02 DIAGNOSIS — M4608 Spinal enthesopathy, sacral and sacrococcygeal region: Secondary | ICD-10-CM | POA: Diagnosis not present

## 2015-08-02 DIAGNOSIS — Z79891 Long term (current) use of opiate analgesic: Secondary | ICD-10-CM | POA: Diagnosis not present

## 2015-08-02 DIAGNOSIS — R1030 Lower abdominal pain, unspecified: Secondary | ICD-10-CM | POA: Diagnosis not present

## 2015-08-02 DIAGNOSIS — G894 Chronic pain syndrome: Secondary | ICD-10-CM | POA: Diagnosis not present

## 2015-08-06 ENCOUNTER — Ambulatory Visit
Admission: RE | Admit: 2015-08-06 | Discharge: 2015-08-06 | Disposition: A | Discharge status: Home / Self Care | Payer: Medicare Other | Source: Ambulatory Visit | Attending: Internal Medicine | Admitting: Internal Medicine

## 2015-08-06 DIAGNOSIS — R928 Other abnormal and inconclusive findings on diagnostic imaging of breast: Secondary | ICD-10-CM

## 2015-08-22 DIAGNOSIS — L820 Inflamed seborrheic keratosis: Secondary | ICD-10-CM | POA: Diagnosis not present

## 2015-08-22 DIAGNOSIS — D1730 Benign lipomatous neoplasm of skin and subcutaneous tissue of unspecified sites: Secondary | ICD-10-CM | POA: Diagnosis not present

## 2015-08-22 DIAGNOSIS — R202 Paresthesia of skin: Secondary | ICD-10-CM | POA: Diagnosis not present

## 2015-10-15 DIAGNOSIS — Z23 Encounter for immunization: Secondary | ICD-10-CM | POA: Diagnosis not present

## 2015-10-19 DIAGNOSIS — M4608 Spinal enthesopathy, sacral and sacrococcygeal region: Secondary | ICD-10-CM | POA: Diagnosis not present

## 2015-10-19 DIAGNOSIS — G894 Chronic pain syndrome: Secondary | ICD-10-CM | POA: Diagnosis not present

## 2015-10-19 DIAGNOSIS — R1030 Lower abdominal pain, unspecified: Secondary | ICD-10-CM | POA: Diagnosis not present

## 2015-10-19 DIAGNOSIS — Z79891 Long term (current) use of opiate analgesic: Secondary | ICD-10-CM | POA: Diagnosis not present

## 2015-10-29 ENCOUNTER — Ambulatory Visit: Payer: Medicare Other | Admitting: Internal Medicine

## 2015-11-02 ENCOUNTER — Encounter: Payer: Self-pay | Admitting: Internal Medicine

## 2015-11-02 ENCOUNTER — Other Ambulatory Visit (INDEPENDENT_AMBULATORY_CARE_PROVIDER_SITE_OTHER): Payer: Medicare Other

## 2015-11-02 ENCOUNTER — Ambulatory Visit (INDEPENDENT_AMBULATORY_CARE_PROVIDER_SITE_OTHER): Payer: Medicare Other | Admitting: Internal Medicine

## 2015-11-02 VITALS — BP 122/74 | HR 67 | Temp 98.3°F | Resp 12 | Ht 59.0 in | Wt 121.0 lb

## 2015-11-02 DIAGNOSIS — R5383 Other fatigue: Secondary | ICD-10-CM

## 2015-11-02 DIAGNOSIS — E538 Deficiency of other specified B group vitamins: Secondary | ICD-10-CM | POA: Diagnosis not present

## 2015-11-02 DIAGNOSIS — M810 Age-related osteoporosis without current pathological fracture: Secondary | ICD-10-CM | POA: Diagnosis not present

## 2015-11-02 DIAGNOSIS — E559 Vitamin D deficiency, unspecified: Secondary | ICD-10-CM | POA: Diagnosis not present

## 2015-11-02 LAB — COMPREHENSIVE METABOLIC PANEL
ALT: 12 U/L (ref 0–35)
AST: 21 U/L (ref 0–37)
Albumin: 3.6 g/dL (ref 3.5–5.2)
Alkaline Phosphatase: 62 U/L (ref 39–117)
BUN: 14 mg/dL (ref 6–23)
CO2: 33 mEq/L — ABNORMAL HIGH (ref 19–32)
Calcium: 9 mg/dL (ref 8.4–10.5)
Chloride: 100 mEq/L (ref 96–112)
Creatinine, Ser: 0.57 mg/dL (ref 0.40–1.20)
GFR: 105.33 mL/min (ref >60.00)
Glucose, Bld: 91 mg/dL (ref 70–99)
Potassium: 4 mEq/L (ref 3.5–5.1)
Sodium: 137 mEq/L (ref 135–145)
Total Bilirubin: 0.3 mg/dL (ref 0.2–1.2)
Total Protein: 6.7 g/dL (ref 6.0–8.3)

## 2015-11-02 LAB — VITAMIN B12: Vitamin B-12: 401 pg/mL (ref 211–911)

## 2015-11-02 LAB — TSH: TSH: 3.05 u[IU]/mL (ref 0.35–4.50)

## 2015-11-02 LAB — VITAMIN D 25 HYDROXY (VIT D DEFICIENCY, FRACTURES): VITD: 23.94 ng/mL — ABNORMAL LOW (ref 30.00–100.00)

## 2015-11-02 MED ORDER — GABAPENTIN 300 MG PO CAPS: 300.0000 mg | ORAL_CAPSULE | Freq: Every day | ORAL | Status: DC

## 2015-11-02 MED ORDER — CARBAMIDE PEROXIDE 6.5 % OT SOLN: 5.0000 [drp] | Freq: Two times a day (BID) | OTIC | Status: DC

## 2015-11-02 NOTE — Progress Notes (Signed)
Pre visit review using our clinic review tool, if applicable. No additional management support is needed unless otherwise documented below in the visit note. 

## 2015-11-02 NOTE — Patient Instructions (Signed)
You can try to take vitamin B12 over the counter. Generally the dose is 500 mcg daily.   We are checking the labs today and will call you back.   We have sent in gabapentin and you can try taking 1 pill about 30 minutes before bedtime to see if it works for the pain.  We have also sent in the ear drops to help with the wax in the right ear.

## 2015-11-04 DIAGNOSIS — E559 Vitamin D deficiency, unspecified: Secondary | ICD-10-CM | POA: Insufficient documentation

## 2015-11-04 DIAGNOSIS — R5383 Other fatigue: Secondary | ICD-10-CM | POA: Insufficient documentation

## 2015-11-04 NOTE — Assessment & Plan Note (Signed)
Prior known borderline low B12 and low vitamin D. Checking vitamin B12 and D and TSH and CMP today to rule out additional process. Advised to start B12 supplement.

## 2015-11-04 NOTE — Assessment & Plan Note (Signed)
Checking vitamin D level and if appropriate change therapy if needed.

## 2015-11-04 NOTE — Progress Notes (Signed)
   Subjective:    Patient ID: [name] Barker, female    DOB: 01/28/1921, 81 y.o.   MRN: [id number]  HPI The patient is a 81 YO female coming in for follow up of her fatigue and her vitamin D deficiency. She is taking the vitamin D but is not sure it is working. Also her B12 level was on the low side last time and she wants to know if taking a supplement would help with her energy levels. She denies problems sleeping or depression. Low energy levels although she is rested with sleep.   Review of Systems  Constitutional: Positive for activity change and fatigue. Negative for fever, appetite change and unexpected weight change.  HENT: Negative.   Eyes: Negative.   Respiratory: Negative for cough, chest tightness, shortness of breath and wheezing.   Cardiovascular: Negative for chest pain, palpitations and leg swelling.  Gastrointestinal: Negative.   Musculoskeletal: Negative.   Skin: Negative.   Neurological: Negative.   Psychiatric/Behavioral: Negative.       Objective:   Physical Exam  Constitutional: She is oriented to person, place, and time. She appears well-developed and well-nourished.  HENT:  Head: Normocephalic and atraumatic.  Eyes: EOM are normal.  Neck: Normal range of motion.  Cardiovascular: Normal rate and regular rhythm.   Pulmonary/Chest: Effort normal and breath sounds normal. No respiratory distress. She has no wheezes. She has no rales.  Abdominal: Soft. Bowel sounds are normal. She exhibits no distension. There is no tenderness. There is no rebound.  Musculoskeletal: Normal range of motion. She exhibits no edema.  Neurological: She is alert and oriented to person, place, and time. Coordination normal.  Skin: Skin is warm and dry.  Psychiatric: She has a normal mood and affect.   Filed Vitals:   11/02/15 1351  BP: 122/74  Pulse: 67  Temp: 98.3 F (36.8 C)  TempSrc: Oral  Resp: 12  Height: 4\' 11"  (1.499 m)  Weight: 121 lb (54.885 kg)  SpO2: 98%        Assessment & Plan:

## 2015-11-13 ENCOUNTER — Encounter: Payer: Self-pay | Admitting: Internal Medicine

## 2015-11-13 ENCOUNTER — Other Ambulatory Visit: Payer: Self-pay | Admitting: Internal Medicine

## 2015-11-26 DIAGNOSIS — N898 Other specified noninflammatory disorders of vagina: Secondary | ICD-10-CM | POA: Diagnosis not present

## 2015-12-06 ENCOUNTER — Encounter: Payer: Self-pay | Admitting: Internal Medicine

## 2015-12-06 ENCOUNTER — Ambulatory Visit (INDEPENDENT_AMBULATORY_CARE_PROVIDER_SITE_OTHER): Payer: Medicare Other | Admitting: Internal Medicine

## 2015-12-06 VITALS — BP 150/90 | HR 65 | Temp 97.9°F | Resp 12 | Ht 59.0 in | Wt 120.0 lb

## 2015-12-06 DIAGNOSIS — G8929 Other chronic pain: Secondary | ICD-10-CM

## 2015-12-06 DIAGNOSIS — M545 Low back pain: Secondary | ICD-10-CM

## 2015-12-06 MED ORDER — GABAPENTIN 300 MG PO CAPS: 300.0000 mg | ORAL_CAPSULE | Freq: Two times a day (BID) | ORAL | Status: DC

## 2015-12-06 NOTE — Progress Notes (Signed)
Pre visit review using our clinic review tool, if applicable. No additional management support is needed unless otherwise documented below in the visit note. 

## 2015-12-06 NOTE — Patient Instructions (Signed)
You can take the gabapentin twice a day. You can try taking 2 pills at night time or take 1 pill and then another at [facility] when you wake up.   I would recommend on nights you take 2 gabapentin take less of the vicodin as both medicines are likely to make you drowsy.

## 2015-12-07 ENCOUNTER — Ambulatory Visit: Payer: Medicare Other | Admitting: Internal Medicine

## 2015-12-08 NOTE — Assessment & Plan Note (Signed)
Doing well with voltaren and gabapentin, uses rare hydrocodone (1/2) at night time so she can sleep. No problems with falls, dizziness.

## 2015-12-08 NOTE — Progress Notes (Signed)
   Subjective:    Patient ID: [name] Barker, female    DOB: 1921/04/07, 81 y.o.   MRN: [id number]  HPI The patient is a 81 YO female coming in for follow up of her joint and muscle pain. She is still taking her voltaren and this helps significantly as she cannot take oral NSAIDS due to her medical conditions and medications. She is able to get around the house somewhat although balance is not great. She is not exercising much. No falls. She is very careful of her footing and making sure she has her cane all the time. No new concerns.   Review of Systems  Constitutional: Positive for activity change and fatigue. Negative for fever, appetite change and unexpected weight change.  HENT: Negative.   Eyes: Negative.   Respiratory: Negative for cough, chest tightness, shortness of breath and wheezing.   Cardiovascular: Negative for chest pain, palpitations and leg swelling.  Gastrointestinal: Negative.   Musculoskeletal: Negative.   Skin: Negative.   Neurological: Negative.   Psychiatric/Behavioral: Negative.       Objective:   Physical Exam  Constitutional: She is oriented to person, place, and time. She appears well-developed and well-nourished.  HENT:  Head: Normocephalic and atraumatic.  Eyes: EOM are normal.  Neck: Normal range of motion.  Cardiovascular: Normal rate and regular rhythm.   Pulmonary/Chest: Effort normal and breath sounds normal. No respiratory distress. She has no wheezes. She has no rales.  Abdominal: Soft. Bowel sounds are normal. She exhibits no distension. There is no tenderness. There is no rebound.  Musculoskeletal: Normal range of motion. She exhibits no edema.  Neurological: She is alert and oriented to person, place, and time. Coordination normal.  Skin: Skin is warm and dry.  Psychiatric: She has a normal mood and affect.   Filed Vitals:   12/06/15 0957  BP: 150/90  Pulse: 65  Temp: 97.9 F (36.6 C)  TempSrc: Oral  Resp: 12  Height: 4\' 11"   (1.499 m)  Weight: 120 lb (54.432 kg)  SpO2: 97%      Assessment & Plan:

## 2015-12-26 ENCOUNTER — Encounter: Payer: Self-pay | Admitting: *Deleted

## 2016-01-22 DIAGNOSIS — R1030 Lower abdominal pain, unspecified: Secondary | ICD-10-CM | POA: Diagnosis not present

## 2016-01-22 DIAGNOSIS — Z79891 Long term (current) use of opiate analgesic: Secondary | ICD-10-CM | POA: Diagnosis not present

## 2016-01-22 DIAGNOSIS — M4608 Spinal enthesopathy, sacral and sacrococcygeal region: Secondary | ICD-10-CM | POA: Diagnosis not present

## 2016-01-22 DIAGNOSIS — G894 Chronic pain syndrome: Secondary | ICD-10-CM | POA: Diagnosis not present

## 2016-01-25 ENCOUNTER — Telehealth: Payer: Self-pay | Admitting: Internal Medicine

## 2016-01-25 NOTE — Telephone Encounter (Signed)
Patient Name: [name] Barker DOB: [birth year] Initial Comment Caller states wife fainted about 2 weeks ago and hit her head on bathtub, been having dizzy spells Nurse Assessment Nurse: Vallery Sa, RN, [place] Date/Time (Eastern Time): 01/25/2016 10:44:42 AM Confirm and document reason for call. If symptomatic, describe symptoms. You must click the next button to save text entered. ---Caller states [name] fell and hit her head two weeks ago and has been having dizziness that on and off that is worse today. No severe breathing difficulty. Alert and responsive. She has not had any medical evaluation since she hit her head. Has the patient traveled out of the country within the last 30 days? ---No Does the patient have any new or worsening symptoms? ---Yes Will a triage be completed? ---Yes Related visit to physician within the last 2 weeks? ---No Does the PT have any chronic conditions? (i.e. diabetes, asthma, etc.) ---Yes List chronic conditions. ---Back and abdominal pain, Asthma (no wheezing or breathing difficulty) Is this a behavioral health or substance abuse call? ---No Guidelines Guideline Title Affirmed Question Affirmed Notes Head Injury [1] Large swelling AND [2] size > palm of person's hand Final Disposition User Go to ED Now Vallery Sa, RN, [place] Referrals [facility] - ED Disagree/Comply: Comply

## 2016-01-29 ENCOUNTER — Ambulatory Visit (INDEPENDENT_AMBULATORY_CARE_PROVIDER_SITE_OTHER): Payer: Medicare Other | Admitting: Internal Medicine

## 2016-01-29 ENCOUNTER — Encounter: Payer: Self-pay | Admitting: Internal Medicine

## 2016-01-29 VITALS — BP 110/72 | HR 65 | Temp 97.9°F | Resp 16 | Ht 59.0 in | Wt 124.0 lb

## 2016-01-29 DIAGNOSIS — G8929 Other chronic pain: Secondary | ICD-10-CM | POA: Diagnosis not present

## 2016-01-29 DIAGNOSIS — R55 Syncope and collapse: Secondary | ICD-10-CM | POA: Diagnosis not present

## 2016-01-29 DIAGNOSIS — R0789 Other chest pain: Secondary | ICD-10-CM

## 2016-01-29 DIAGNOSIS — M545 Low back pain, unspecified: Secondary | ICD-10-CM

## 2016-01-29 NOTE — Patient Instructions (Signed)
The EKG is not any different from the last time. We have cleaned out the ears to see if this helps with the dizziness.   Make sure to stand slowly and not rise quickly to help avoid dizziness.   Make sure to stay well hydrated as well.

## 2016-01-29 NOTE — Progress Notes (Signed)
Pre visit review using our clinic review tool, if applicable. No additional management support is needed unless otherwise documented below in the visit note. 

## 2016-01-31 ENCOUNTER — Encounter: Payer: Self-pay | Admitting: Internal Medicine

## 2016-02-01 DIAGNOSIS — R55 Syncope and collapse: Secondary | ICD-10-CM | POA: Insufficient documentation

## 2016-02-01 NOTE — Assessment & Plan Note (Signed)
This was likely due to medication effect of the gabapentin. She has since stopped. Ears cleaned at visit in case this was contributing to the dizziness. EKG done in office without significant change from prior. Recent labs reviewed and no indication for change.

## 2016-02-01 NOTE — Assessment & Plan Note (Signed)
She has stopped the gabapentin altogether and given her fall this is reasonable. She is allowed to take the hydrocodone as needed for pain at night time. This does not cause her problems even if she wakes in the night to use the restroom.

## 2016-02-01 NOTE — Progress Notes (Signed)
   Subjective:    Patient ID: [name] Barker, female    DOB: 29-Nov-1921, 82 y.o.   MRN: [id number]  HPI The patient is a 82 YO female coming in for follow up of fall at home. She did fall in the bathroom with some pre-syncope symptoms. She did her head on the tub and had a large bruise and some mild headache. Her relative is a PA and evaluated her and so they did not seek care in the ER. Denies any loss of consciousness since that time. No more headaches but that spot is still sore to the touch. No nausea or vomiting. This happened after they increased the gabapentin to twice a day. They have since stopped taking it at all. Denies significant dizziness since that time.  Review of Systems  Constitutional: Positive for activity change and fatigue. Negative for fever, appetite change and unexpected weight change.  HENT: Negative.   Eyes: Negative.   Respiratory: Negative for cough, chest tightness, shortness of breath and wheezing.   Cardiovascular: Negative for chest pain, palpitations and leg swelling.  Gastrointestinal: Negative.   Musculoskeletal: Negative.   Skin: Negative.   Neurological: Negative.   Psychiatric/Behavioral: Negative.       Objective:   Physical Exam  Constitutional: She is oriented to person, place, and time. She appears well-developed and well-nourished.  HENT:  Head: Normocephalic and atraumatic.  One ear blocked with wax prior to disimpaction and after both TM clear.   Eyes: EOM are normal.  Neck: Normal range of motion.  Cardiovascular: Normal rate and regular rhythm.   Pulmonary/Chest: Effort normal and breath sounds normal. No respiratory distress. She has no wheezes. She has no rales.  Abdominal: Soft. Bowel sounds are normal. She exhibits no distension. There is no tenderness. There is no rebound.  Musculoskeletal: Normal range of motion. She exhibits no edema.  Neurological: She is alert and oriented to person, place, and time. Coordination normal.    Skin: Skin is warm and dry.  Psychiatric: She has a normal mood and affect.   Filed Vitals:   01/29/16 1357  BP: 110/72  Pulse: 65  Temp: 97.9 F (36.6 C)  TempSrc: Oral  Resp: 16  Height: 4\' 11"  (1.499 m)  Weight: 124 lb (56.246 kg)  SpO2: 96%   EKG: rate 51, low voltage throughout, axis and intervals normal, no st or t wave changes, no significant change from 2013.     Assessment & Plan:

## 2016-02-25 DIAGNOSIS — B372 Candidiasis of skin and nail: Secondary | ICD-10-CM | POA: Diagnosis not present

## 2016-02-25 DIAGNOSIS — N760 Acute vaginitis: Secondary | ICD-10-CM | POA: Diagnosis not present

## 2016-03-19 ENCOUNTER — Other Ambulatory Visit (INDEPENDENT_AMBULATORY_CARE_PROVIDER_SITE_OTHER): Payer: Medicare Other

## 2016-03-19 ENCOUNTER — Encounter: Payer: Self-pay | Admitting: Internal Medicine

## 2016-03-19 ENCOUNTER — Ambulatory Visit (INDEPENDENT_AMBULATORY_CARE_PROVIDER_SITE_OTHER): Payer: Medicare Other | Admitting: Internal Medicine

## 2016-03-19 ENCOUNTER — Ambulatory Visit (INDEPENDENT_AMBULATORY_CARE_PROVIDER_SITE_OTHER)
Admission: RE | Admit: 2016-03-19 | Discharge: 2016-03-19 | Disposition: A | Discharge status: Home / Self Care | Payer: Medicare Other | Source: Ambulatory Visit | Attending: Internal Medicine | Admitting: Internal Medicine

## 2016-03-19 VITALS — BP 112/68 | HR 62 | Resp 20 | Wt 125.0 lb

## 2016-03-19 DIAGNOSIS — D649 Anemia, unspecified: Secondary | ICD-10-CM

## 2016-03-19 DIAGNOSIS — G459 Transient cerebral ischemic attack, unspecified: Secondary | ICD-10-CM

## 2016-03-19 DIAGNOSIS — R5383 Other fatigue: Secondary | ICD-10-CM

## 2016-03-19 DIAGNOSIS — G8929 Other chronic pain: Secondary | ICD-10-CM | POA: Diagnosis not present

## 2016-03-19 DIAGNOSIS — M545 Low back pain, unspecified: Secondary | ICD-10-CM

## 2016-03-19 LAB — BASIC METABOLIC PANEL
BUN: 15 mg/dL (ref 6–23)
CO2: 30 mEq/L (ref 19–32)
Calcium: 9 mg/dL (ref 8.4–10.5)
Chloride: 100 mEq/L (ref 96–112)
Creatinine, Ser: 0.57 mg/dL (ref 0.40–1.20)
GFR: 105.24 mL/min (ref >60.00)
Glucose, Bld: 93 mg/dL (ref 70–99)
Potassium: 4.2 mEq/L (ref 3.5–5.1)
Sodium: 135 mEq/L (ref 135–145)

## 2016-03-19 LAB — CBC WITH DIFFERENTIAL/PLATELET
Basophils Absolute: 0 10*3/uL (ref 0.0–0.1)
Basophils Relative: 0.6 % (ref 0.0–3.0)
Eosinophils Absolute: 0.2 10*3/uL (ref 0.0–0.7)
Eosinophils Relative: 2.3 % (ref 0.0–5.0)
HCT: 34.4 % — ABNORMAL LOW (ref 36.0–46.0)
Hemoglobin: 11.5 g/dL — ABNORMAL LOW (ref 12.0–15.0)
Lymphocytes Relative: 36.3 % (ref 12.0–46.0)
Lymphs Abs: 2.4 10*3/uL (ref 0.7–4.0)
MCHC: 33.5 g/dL (ref 30.0–36.0)
MCV: 89.5 fl (ref 78.0–100.0)
Monocytes Absolute: 0.7 10*3/uL (ref 0.1–1.0)
Monocytes Relative: 10.6 % (ref 3.0–12.0)
Neutro Abs: 3.3 10*3/uL (ref 1.4–7.7)
Neutrophils Relative %: 50.2 % (ref 43.0–77.0)
Platelets: 225 10*3/uL (ref 150.0–400.0)
RBC: 3.84 Mil/uL — ABNORMAL LOW (ref 3.87–5.11)
RDW: 12.7 % (ref 11.5–15.5)
WBC: 6.6 10*3/uL (ref 4.0–10.5)

## 2016-03-19 LAB — IBC PANEL
Iron: 77 ug/dL (ref 42–145)
Saturation Ratios: 23.6 % (ref 20.0–50.0)
Transferrin: 233 mg/dL (ref 212.0–360.0)

## 2016-03-19 LAB — SEDIMENTATION RATE: Sed Rate: 26 mm/hr — ABNORMAL HIGH (ref 0–22)

## 2016-03-19 LAB — HEPATIC FUNCTION PANEL
ALT: 11 U/L (ref 0–35)
AST: 20 U/L (ref 0–37)
Albumin: 3.6 g/dL (ref 3.5–5.2)
Alkaline Phosphatase: 55 U/L (ref 39–117)
Bilirubin, Direct: 0.1 mg/dL (ref 0.0–0.3)
Total Bilirubin: 0.4 mg/dL (ref 0.2–1.2)
Total Protein: 6.9 g/dL (ref 6.0–8.3)

## 2016-03-19 LAB — TSH: TSH: 3 u[IU]/mL (ref 0.35–4.50)

## 2016-03-19 MED ORDER — CITALOPRAM HYDROBROMIDE 10 MG PO TABS: 10.0000 mg | ORAL_TABLET | Freq: Every day | ORAL | Status: DC

## 2016-03-19 NOTE — Patient Instructions (Signed)
Please take all new medication as prescribed- the celexa 10 mg daily  Please continue all other medications as before, and refills have been done if requested.  Please have the pharmacy call with any other refills you may need.  Please keep your appointments with your specialists as you may have planned  Please go to the XRAY Department in the Basement (go straight as you get off the elevator) for the x-ray testing  Please go to the LAB in the Basement (turn left off the elevator) for the tests to be done today  You will be contacted by phone if any changes need to be made immediately.  Otherwise, you will receive a letter about your results with an explanation, but please check with MyChart first.  Please remember to sign up for MyChart if you have not done so, as this will be important to you in the future with finding out test results, communicating by private email, and scheduling acute appointments online when needed.

## 2016-03-19 NOTE — Progress Notes (Signed)
Subjective:    Patient ID: [name] Barker, female    DOB: 1921-02-23, 82 y.o.   MRN: [id number]  HPI  Here with husband, with c/ofatigue, acute for several days on chronic; Pt denies chest pain, increased sob or doe, wheezing, orthopnea, PND, increased LE swelling, palpitations, dizziness or syncope.  Pt denies new neurological symptoms such as new headache, or facial or extremity weakness or numbness   Pt denies polydipsia, polyuria. Denies urinary symptoms such as dysuria, frequency, urgency, flank pain, hematuria or n/v, fever, chills.   Pt denies fever, wt loss, night sweats, or other constitutional symptoms  Pt continues to have recurring LBP without change in severity, bowel or bladder change, fever, wt loss,  worsening LE pain/numbness/weakness, gait change or falls. Has chronic pain, sees pain management,   Has had mild worsening depressive symptoms, suicidal ideation, or panic; has ongoing anxiety, not increased recently. Denies worsening reflux, abd pain, dysphagia, n/v, bowel change or blood. States no appetite, but chart records no wt loss Wt Readings from Last 3 Encounters:  03/19/16 125 lb (56.7 kg)  01/29/16 124 lb (56.246 kg)  12/06/15 120 lb (54.432 kg)  BP is labile  It seems, maybe worse when she "gets upset" BP Readings from Last 3 Encounters:  03/19/16 112/68  01/29/16 110/72  12/06/15 150/90  No longer taking the prevacid, also the neurontin as she had syncope after first started. Chart review shows hx of mild anemia not recently checked: Lab Results  Component Value Date   WBC 5.8 09/26/2013   HGB 11.5* 09/26/2013   HCT 34.7* 09/26/2013   MCV 90.3 09/26/2013   PLT 241.0 09/26/2013   Past Medical History  Diagnosis Date  . Colon cancer ([place]) 08/19/2007    1994 T2, 2008 T3, N1  . ANEMIA-NOS 11/17/2006  . CAD 06/21/2010  . DIVERTICULOSIS-COLON 05/30/2010  . GERD 11/17/2006  . HYPERLIPIDEMIA 11/17/2006  . LOW BACK PAIN, CHRONIC 02/04/2010  . Osteopenia  11/17/2006  . Allergic rhinitis   . Small bowel obstruction ([place])   . Hemorrhoids   . TIA (transient ischemic attack)    Past Surgical History  Procedure Laterality Date  . Appendectomy    . Back surgery  2007  . Hemicolectomy  1994    Right  . Abdominal hysterectomy    . Joint replacement    . Shoulder surgery    . Knee arthroscopy    . Partial colectomy  2008  . Rotator cuff repair    . Vaginal prolapse repair    . Cataract extraction    . Belpharoptosis repair      reports that she has never smoked. She has never used smokeless tobacco. She reports that she does not drink alcohol or use illicit drugs. family history includes Colon cancer (age of onset: 57) in her sister; Kidney cancer in her sister; Lung cancer in her sister. Allergies  Allergen Reactions  . Flexeril [Cyclobenzaprine]     nightmares  . Methenamine Mandelate     REACTION: burning sensation  . Morphine Sulfate     REACTION: blister  . Penicillins     REACTION: urticaria (hives)  . Promethazine Hcl     REACTION: hallucinations  . Statins Other (See Comments)    Muscle pain    Current Outpatient Prescriptions on File Prior to Visit  Medication Sig Dispense Refill  . aspirin 81 MG tablet Take 162 mg by mouth daily.    . carbamide peroxide (DEBROX) 6.5 % otic solution Place  5 drops into the right ear 2 (two) times daily. 15 mL 0  . Cholecalciferol (VITAMIN D) 2000 UNITS tablet Take by mouth daily. 5,000 IU daily    . Docusate Calcium (STOOL SOFTENER PO) Take 3 tablets by mouth every evening.     . Fish Oil-Cholecalciferol (FISH OIL + D3 PO) Take by mouth.    Marland Kitchen glycerin adult (GLYCERIN ADULT) 2 G SUPP Place 1 suppository rectally daily.     Marland Kitchen HYDROcodone-acetaminophen (NORCO/VICODIN) 5-325 MG per tablet Take 0.5 tablets by mouth every 6 (six) hours as needed for moderate pain. 60 tablet 0  . PROAIR HFA 108 (90 BASE) MCG/ACT inhaler INHALE 2 PUFFS INTO THE LUNGS 2 (TWO) TIMES DAILY AS NEEDED. FOR WHEEZING  8.5 Inhaler 4  . Probiotic Product (ALIGN) 4 MG CAPS Take 1 capsule by mouth daily.     . psyllium (METAMUCIL) 58.6 % packet Take 1 packet by mouth daily.    Marland Kitchen senna-docusate (SENOKOT-S) 8.6-50 MG per tablet Take 1 tablet by mouth every evening.    . simethicone (MYLICON) 80 MG chewable tablet Chew 80 mg by mouth every 6 (six) hours as needed for flatulence.    . sodium chloride (MURO 128) 5 % ophthalmic ointment Place 1 drop into both eyes daily.    Marland Kitchen triamcinolone (KENALOG) 0.025 % ointment Apply 1 application topically 2 (two) times daily.    . VOLTAREN 1 % GEL APPLY TO AFFECTED AREA EVERY DAY AS NEEDED BACK PAIN 400 g 5   No current facility-administered medications on file prior to visit.      Review of Systems  Constitutional: Negative for unusual diaphoresis or night sweats HENT: Negative for ringing in ear or discharge Eyes: Negative for double vision or worsening visual disturbance.  Respiratory: Negative for choking and stridor.   Gastrointestinal: Negative for vomiting or other signifcant bowel change Genitourinary: Negative for hematuria or change in urine volume.  Musculoskeletal: Negative for other MSK pain or swelling Skin: Negative for color change and worsening wound.  Neurological: Negative for tremors and numbness other than noted  Psychiatric/Behavioral: Negative for decreased concentration or agitation other than above       Objective:   Physical Exam BP 112/68 mmHg  Pulse 62  Resp 20  Wt 125 lb (56.7 kg)  SpO2 93% VS noted, alert, conversant, has vision blurriness, thin and frail, walks with walker Constitutional: Pt appears in no significant distress HENT: Head: NCAT.  Right Ear: External ear normal.  Left Ear: External ear normal.  Eyes: . Pupils are equal, round, and reactive to light. Conjunctivae and EOM are normal Neck: Normal range of motion. Neck supple.  Cardiovascular: Normal rate and regular rhythm.   Pulmonary/Chest: Effort normal and breath  sounds without rales or wheezing.  Abd:  Soft, NT, ND, + BS Neurological: Pt is alert. Not confused , motor grossly intact Skin: Skin is warm. No rash, no LE edema Psychiatric: Pt behavior is normal. No agitation. but anxious/depressed affect    Assessment & Plan:

## 2016-03-19 NOTE — Progress Notes (Signed)
Pre visit review using our clinic review tool, if applicable. No additional management support is needed unless otherwise documented below in the visit note. 

## 2016-03-19 NOTE — Assessment & Plan Note (Addendum)
Etiology unclear, no acute or specific findings by hx or exam, Etiology unclear, Exam otherwise benign, to check labs as documented, follow with expectant management , Does not appear to need PT at this [facility]

## 2016-03-21 NOTE — Assessment & Plan Note (Signed)
No recent overt bleeding,  Lab Results  Component Value Date   WBC 5.8 09/26/2013   HGB 11.5* 09/26/2013   HCT 34.7* 09/26/2013   MCV 90.3 09/26/2013   PLT 241.0 09/26/2013   For f/u lab as well, r/o worsening cbc

## 2016-03-21 NOTE — Assessment & Plan Note (Signed)
stable overall by history and exam, and pt to continue medical treatment as before,  to f/u any worsening symptoms or concerns, cont to f/u with pain management

## 2016-03-21 NOTE — Assessment & Plan Note (Signed)
stable overall by history and exam, recent data reviewed with pt, and pt to continue medical treatment as before,  to f/u any worsening symptoms or concerns Lab Results  Component Value Date   CHOL 201* 09/26/2013   HDL 46.90 09/26/2013   LDLCALC 132* 04/05/2012   LDLDIRECT 133.6 09/26/2013   TRIG 98.0 09/26/2013   CHOLHDL 4 09/26/2013   Pt has been statin intolerant, to follow lower chol diet, f/u with PCP

## 2016-04-15 DIAGNOSIS — G894 Chronic pain syndrome: Secondary | ICD-10-CM | POA: Diagnosis not present

## 2016-04-15 DIAGNOSIS — R1030 Lower abdominal pain, unspecified: Secondary | ICD-10-CM | POA: Diagnosis not present

## 2016-04-15 DIAGNOSIS — M4608 Spinal enthesopathy, sacral and sacrococcygeal region: Secondary | ICD-10-CM | POA: Diagnosis not present

## 2016-04-15 DIAGNOSIS — Z79891 Long term (current) use of opiate analgesic: Secondary | ICD-10-CM | POA: Diagnosis not present

## 2016-05-01 ENCOUNTER — Encounter: Payer: Medicare Other | Admitting: Internal Medicine

## 2016-05-16 ENCOUNTER — Encounter: Payer: Self-pay | Admitting: Internal Medicine

## 2016-05-16 ENCOUNTER — Ambulatory Visit: Payer: Medicare Other | Admitting: Internal Medicine

## 2016-05-16 VITALS — BP 116/72 | HR 88 | Temp 97.9°F | Resp 16 | Ht 59.0 in | Wt 124.0 lb

## 2016-05-16 DIAGNOSIS — Z0000 Encounter for general adult medical examination without abnormal findings: Secondary | ICD-10-CM

## 2016-05-16 MED ORDER — CITALOPRAM HYDROBROMIDE 10 MG PO TABS: 10.0000 mg | ORAL_TABLET | Freq: Every day | ORAL | Status: DC

## 2016-05-16 NOTE — Progress Notes (Signed)
   Subjective:    Patient ID: [name] Barker, female    DOB: 20-Dec-1921, 82 y.o.   MRN: [id number]  HPI Here for medicare wellness, no new complaints. Please see A/P for status and treatment of chronic medical problems.   Diet: heart healthy Physical activity: sedentary Depression/mood screen: negative Hearing: intact to whispered voice Visual acuity: grossly normal, performs annual eye exam  ADLs: capable Fall risk: none Home safety: good Cognitive evaluation: intact to orientation, naming, recall and repetition EOL planning: adv directives discussed  I have personally reviewed and have noted 1. The patient's medical and social history - reviewed today no changes 2. Their use of alcohol, tobacco or illicit drugs 3. Their current medications and supplements 4. The patient's functional ability including ADL's, fall risks, home safety risks and hearing or visual impairment. 5. Diet and physical activities 6. Evidence for depression or mood disorders 7. Care team reviewed and updated (available in snapshot)  Review of Systems  Constitutional: Positive for activity change and fatigue. Negative for fever, appetite change and unexpected weight change.  HENT: Negative.   Eyes: Negative.   Respiratory: Negative for cough, chest tightness, shortness of breath and wheezing.   Cardiovascular: Negative for chest pain, palpitations and leg swelling.  Gastrointestinal: Negative.   Musculoskeletal: Negative.   Skin: Negative.   Neurological: Negative.   Psychiatric/Behavioral: Negative.       Objective:   Physical Exam  Constitutional: She is oriented to person, place, and time. She appears well-developed and well-nourished.  HENT:  Head: Normocephalic and atraumatic.  Eyes: EOM are normal.  Neck: Normal range of motion.  Cardiovascular: Normal rate and regular rhythm.   Pulmonary/Chest: Effort normal and breath sounds normal. No respiratory distress. She has no wheezes. She has  no rales.  Abdominal: Soft. Bowel sounds are normal. She exhibits no distension. There is no tenderness. There is no rebound.  Musculoskeletal: Normal range of motion. She exhibits no edema.  Neurological: She is alert and oriented to person, place, and time. Coordination normal.  Skin: Skin is warm and dry.  Psychiatric: She has a normal mood and affect.   Filed Vitals:   05/16/16 1433  BP: 116/72  Pulse: 88  Temp: 97.9 F (36.6 C)  TempSrc: Oral  Resp: 16  Height: 4\' 11"  (1.499 m)  Weight: 124 lb (56.246 kg)  SpO2: 95%      Assessment & Plan:

## 2016-05-16 NOTE — Patient Instructions (Signed)
We do not need any blood work today.   Keep playing scrabble to keep your brain working well.   Health Maintenance, Female Adopting a healthy lifestyle and getting preventive care can go a long way to promote health and wellness. Talk with your health care provider about what schedule of regular examinations is right for you. This is a good chance for you to check in with your provider about disease prevention and staying healthy. In between checkups, there are plenty of things you can do on your own. Experts have done a lot of research about which lifestyle changes and preventive measures are most likely to keep you healthy. Ask your health care provider for more information. WEIGHT AND DIET  Eat a healthy diet  Be sure to include plenty of vegetables, fruits, low-fat dairy products, and lean protein.  Do not eat a lot of foods high in solid fats, added sugars, or salt.  Get regular exercise. This is one of the most important things you can do for your health.  Most adults should exercise for at least 150 minutes each week. The exercise should increase your heart rate and make you sweat (moderate-intensity exercise).  Most adults should also do strengthening exercises at least twice a week. This is in addition to the moderate-intensity exercise.  Maintain a healthy weight  Body mass index (BMI) is a measurement that can be used to identify possible weight problems. It estimates body fat based on height and weight. Your health care provider can help determine your BMI and help you achieve or maintain a healthy weight.  For females 6 years of age and older:   A BMI below 18.5 is considered underweight.  A BMI of 18.5 to 24.9 is normal.  A BMI of 25 to 29.9 is considered overweight.  A BMI of 30 and above is considered obese.  Watch levels of cholesterol and blood lipids  You should start having your blood tested for lipids and cholesterol at 80 years of age, then have this test  every 5 years.  You may need to have your cholesterol levels checked more often if:  Your lipid or cholesterol levels are high.  You are older than 80 years of age.  You are at high risk for heart disease.  CANCER SCREENING   Lung Cancer  Lung cancer screening is recommended for adults 76-58 years old who are at high risk for lung cancer because of a history of smoking.  A yearly low-dose CT scan of the lungs is recommended for people who:  Currently smoke.  Have quit within the past 15 years.  Have at least a 30-pack-year history of smoking. A pack year is smoking an average of one pack of cigarettes a day for 1 year.  Yearly screening should continue until it has been 15 years since you quit.  Yearly screening should stop if you develop a health problem that would prevent you from having lung cancer treatment.  Breast Cancer  Practice breast self-awareness. This means understanding how your breasts normally appear and feel.  It also means doing regular breast self-exams. Let your health care provider know about any changes, no matter how small.  If you are in your 20s or 30s, you should have a clinical breast exam (CBE) by a health care provider every 1-3 years as part of a regular health exam.  If you are 13 or older, have a CBE every year. Also consider having a breast X-ray (mammogram) every year.  If you have a family history of breast cancer, talk to your health care provider about genetic screening.  If you are at high risk for breast cancer, talk to your health care provider about having an MRI and a mammogram every year.  Breast cancer gene (BRCA) assessment is recommended for women who have family members with BRCA-related cancers. BRCA-related cancers include:  Breast.  Ovarian.  Tubal.  Peritoneal cancers.  Results of the assessment will determine the need for genetic counseling and BRCA1 and BRCA2 testing. Cervical Cancer Your health care provider  may recommend that you be screened regularly for cancer of the pelvic organs (ovaries, uterus, and vagina). This screening involves a pelvic examination, including checking for microscopic changes to the surface of your cervix (Pap test). You may be encouraged to have this screening done every 3 years, beginning at age 43.  For women ages 59-65, health care providers may recommend pelvic exams and Pap testing every 3 years, or they may recommend the Pap and pelvic exam, combined with testing for human papilloma virus (HPV), every 5 years. Some types of HPV increase your risk of cervical cancer. Testing for HPV may also be done on women of any age with unclear Pap test results.  Other health care providers may not recommend any screening for nonpregnant women who are considered low risk for pelvic cancer and who do not have symptoms. Ask your health care provider if a screening pelvic exam is right for you.  If you have had past treatment for cervical cancer or a condition that could lead to cancer, you need Pap tests and screening for cancer for at least 20 years after your treatment. If Pap tests have been discontinued, your risk factors (such as having a new sexual partner) need to be reassessed to determine if screening should resume. Some women have medical problems that increase the chance of getting cervical cancer. In these cases, your health care provider may recommend more frequent screening and Pap tests. Colorectal Cancer  This type of cancer can be detected and often prevented.  Routine colorectal cancer screening usually begins at 80 years of age and continues through 80 years of age.  Your health care provider may recommend screening at an earlier age if you have risk factors for colon cancer.  Your health care provider may also recommend using home test kits to check for hidden blood in the stool.  A small camera at the end of a tube can be used to examine your colon directly  (sigmoidoscopy or colonoscopy). This is done to check for the earliest forms of colorectal cancer.  Routine screening usually begins at age 55.  Direct examination of the colon should be repeated every 5-10 years through 80 years of age. However, you may need to be screened more often if early forms of precancerous polyps or small growths are found. Skin Cancer  Check your skin from head to toe regularly.  Tell your health care provider about any new moles or changes in moles, especially if there is a change in a mole's shape or color.  Also tell your health care provider if you have a mole that is larger than the size of a pencil eraser.  Always use sunscreen. Apply sunscreen liberally and repeatedly throughout the day.  Protect yourself by wearing long sleeves, pants, a wide-brimmed hat, and sunglasses whenever you are outside. HEART DISEASE, DIABETES, AND HIGH BLOOD PRESSURE   High blood pressure causes heart disease and increases the risk  of stroke. High blood pressure is more likely to develop in:  People who have blood pressure in the high end of the normal range (130-139/85-89 mm Hg).  People who are overweight or obese.  People who are African American.  If you are 77-22 years of age, have your blood pressure checked every 3-5 years. If you are 9 years of age or older, have your blood pressure checked every year. You should have your blood pressure measured twice--once when you are at a hospital or clinic, and once when you are not at a hospital or clinic. Record the average of the two measurements. To check your blood pressure when you are not at a hospital or clinic, you can use:  An automated blood pressure machine at a pharmacy.  A home blood pressure monitor.  If you are between 44 years and 53 years old, ask your health care provider if you should take aspirin to prevent strokes.  Have regular diabetes screenings. This involves taking a blood sample to check your  fasting blood sugar level.  If you are at a normal weight and have a low risk for diabetes, have this test once every three years after 80 years of age.  If you are overweight and have a high risk for diabetes, consider being tested at a younger age or more often. PREVENTING INFECTION  Hepatitis B  If you have a higher risk for hepatitis B, you should be screened for this virus. You are considered at high risk for hepatitis B if:  You were born in a country where hepatitis B is common. Ask your health care provider which countries are considered high risk.  Your parents were born in a high-risk country, and you have not been immunized against hepatitis B (hepatitis B vaccine).  You have HIV or AIDS.  You use needles to inject street drugs.  You live with someone who has hepatitis B.  You have had sex with someone who has hepatitis B.  You get hemodialysis treatment.  You take certain medicines for conditions, including cancer, organ transplantation, and autoimmune conditions. Hepatitis C  Blood testing is recommended for:  Everyone born from [birth year] through 1963.  Anyone with known risk factors for hepatitis C. Sexually transmitted infections (STIs)  You should be screened for sexually transmitted infections (STIs) including gonorrhea and chlamydia if:  You are sexually active and are younger than 80 years of age.  You are older than 80 years of age and your health care provider tells you that you are at risk for this type of infection.  Your sexual activity has changed since you were last screened and you are at an increased risk for chlamydia or gonorrhea. Ask your health care provider if you are at risk.  If you do not have HIV, but are at risk, it may be recommended that you take a prescription medicine daily to prevent HIV infection. This is called pre-exposure prophylaxis (PrEP). You are considered at risk if:  You are sexually active and do not regularly use condoms or  know the HIV status of your partner(s).  You take drugs by injection.  You are sexually active with a partner who has HIV. Talk with your health care provider about whether you are at high risk of being infected with HIV. If you choose to begin PrEP, you should first be tested for HIV. You should then be tested every 3 months for as long as you are taking PrEP.  PREGNANCY  If you are premenopausal and you may become pregnant, ask your health care provider about preconception counseling.  If you may become pregnant, take 400 to 800 micrograms (mcg) of folic acid every day.  If you want to prevent pregnancy, talk to your health care provider about birth control (contraception). OSTEOPOROSIS AND MENOPAUSE   Osteoporosis is a disease in which the bones lose minerals and strength with aging. This can result in serious bone fractures. Your risk for osteoporosis can be identified using a bone density scan.  If you are 36 years of age or older, or if you are at risk for osteoporosis and fractures, ask your health care provider if you should be screened.  Ask your health care provider whether you should take a calcium or vitamin D supplement to lower your risk for osteoporosis.  Menopause may have certain physical symptoms and risks.  Hormone replacement therapy may reduce some of these symptoms and risks. Talk to your health care provider about whether hormone replacement therapy is right for you.  HOME CARE INSTRUCTIONS   Schedule regular health, dental, and eye exams.  Stay current with your immunizations.   Do not use any tobacco products including cigarettes, chewing tobacco, or electronic cigarettes.  If you are pregnant, do not drink alcohol.  If you are breastfeeding, limit how much and how often you drink alcohol.  Limit alcohol intake to no more than 1 drink per day for nonpregnant women. One drink equals 12 ounces of beer, 5 ounces of wine, or 1 ounces of hard liquor.  Do  not use street drugs.  Do not share needles.  Ask your health care provider for help if you need support or information about quitting drugs.  Tell your health care provider if you often feel depressed.  Tell your health care provider if you have ever been abused or do not feel safe at home.   This information is not intended to replace advice given to you by your health care provider. Make sure you discuss any questions you have with your health care provider.   Document Released: 06/30/2011 Document Revised: 01/05/2015 Document Reviewed: 11/16/2013 Elsevier Interactive Patient Education [employer].

## 2016-05-16 NOTE — Progress Notes (Signed)
Pre visit review using our clinic review tool, if applicable. No additional management support is needed unless otherwise documented below in the visit note. 

## 2016-05-18 ENCOUNTER — Encounter: Payer: Self-pay | Admitting: Internal Medicine

## 2016-05-18 NOTE — Assessment & Plan Note (Signed)
Aged of out colonoscopy and mammogram. Immunizations up to date. Has had dexa and does not want another. Counseled on sun safety and home safety (using cane all the time). Given 10 year screening recommendations.

## 2016-05-19 ENCOUNTER — Other Ambulatory Visit (INDEPENDENT_AMBULATORY_CARE_PROVIDER_SITE_OTHER): Payer: Medicare Other

## 2016-05-19 ENCOUNTER — Telehealth: Payer: Self-pay | Admitting: Internal Medicine

## 2016-05-19 ENCOUNTER — Ambulatory Visit (INDEPENDENT_AMBULATORY_CARE_PROVIDER_SITE_OTHER): Payer: Medicare Other | Admitting: Family

## 2016-05-19 ENCOUNTER — Encounter: Payer: Self-pay | Admitting: Family

## 2016-05-19 ENCOUNTER — Ambulatory Visit (INDEPENDENT_AMBULATORY_CARE_PROVIDER_SITE_OTHER)
Admission: RE | Admit: 2016-05-19 | Discharge: 2016-05-19 | Disposition: A | Discharge status: Home / Self Care | Payer: Medicare Other | Source: Ambulatory Visit | Attending: Family | Admitting: Family

## 2016-05-19 VITALS — BP 130/70 | HR 47 | Temp 97.4°F | Ht 59.0 in | Wt 124.0 lb

## 2016-05-19 DIAGNOSIS — M7989 Other specified soft tissue disorders: Secondary | ICD-10-CM | POA: Diagnosis not present

## 2016-05-19 DIAGNOSIS — R001 Bradycardia, unspecified: Secondary | ICD-10-CM | POA: Diagnosis not present

## 2016-05-19 DIAGNOSIS — R05 Cough: Secondary | ICD-10-CM | POA: Diagnosis not present

## 2016-05-19 DIAGNOSIS — R059 Cough, unspecified: Secondary | ICD-10-CM

## 2016-05-19 LAB — COMPREHENSIVE METABOLIC PANEL
ALT: 15 U/L (ref 0–35)
AST: 32 U/L (ref 0–37)
Albumin: 4 g/dL (ref 3.5–5.2)
Alkaline Phosphatase: 48 U/L (ref 39–117)
BUN: 10 mg/dL (ref 6–23)
CO2: 28 mEq/L (ref 19–32)
Calcium: 8.9 mg/dL (ref 8.4–10.5)
Chloride: 101 mEq/L (ref 96–112)
Creatinine, Ser: 0.51 mg/dL (ref 0.40–1.20)
GFR: 119.61 mL/min (ref >60.00)
Glucose, Bld: 92 mg/dL (ref 70–99)
Potassium: 4.4 mEq/L (ref 3.5–5.1)
Sodium: 136 mEq/L (ref 135–145)
Total Bilirubin: 0.4 mg/dL (ref 0.2–1.2)
Total Protein: 7 g/dL (ref 6.0–8.3)

## 2016-05-19 MED ORDER — ALBUTEROL SULFATE HFA 108 (90 BASE) MCG/ACT IN AERS: INHALATION_SPRAY | RESPIRATORY_TRACT | Status: DC

## 2016-05-19 MED ORDER — DOXYCYCLINE HYCLATE 100 MG PO TABS: 100.0000 mg | ORAL_TABLET | Freq: Two times a day (BID) | ORAL | Status: DC

## 2016-05-19 NOTE — Patient Instructions (Addendum)
Suspect pneumonia.  CXR.  Labs.   Referral to cardiology.   Increase intake of clear fluids. Congestion is best treated by hydration, when mucus is wetter, it is thinner, less sticky, and easier to expel from the body, either through coughing up drainage, or by blowing your nose.   Get plenty of rest.   Use saline nasal drops and blow your nose frequently. Run a humidifier at night and elevate the head of the bed. Vicks Vapor rub will help with congestion and cough. Steam showers and sinus massage for congestion.   Use Acetaminophen or Ibuprofen as needed for fever or pain. Avoid second hand smoke. Even the smallest exposure will worsen symptoms.   Over the counter medications you can try include Delsym for cough, a decongestant for congestion, and Mucinex or Robitussin as an expectorant. Be sure to just get the plain Mucinex or Robitussin that just has one medication (Guaifenesen). We don't recommend the combination products. Note, be sure to drink two glasses of water with each dose of Mucinex as the medication will not work well without adequate hydration.   You can also try a teaspoon of honey to see if this will help reduce cough. Throat lozenges can sometimes be beneficial as well.    This illness will typically last 7 - 10 days.   Please follow up with our clinic if you develop a fever greater than 101 F, symptoms worsen, or do not resolve in the next week.   Community-Acquired Pneumonia, Adult Pneumonia is an infection of the lungs. There are different types of pneumonia. One type can develop while a person is in a hospital. A different type, called community-acquired pneumonia, develops in people who are not, or have not recently been, in the hospital or other health care facility.  CAUSES Pneumonia may be caused by bacteria, viruses, or funguses. Community-acquired pneumonia is often caused by Streptococcus pneumonia bacteria. These bacteria are often passed from one person to  another by breathing in droplets from the cough or sneeze of an infected person. RISK FACTORS The condition is more likely to develop in:  People who havechronic diseases, such as chronic obstructive pulmonary disease (COPD), asthma, congestive heart failure, cystic fibrosis, diabetes, or kidney disease.  People who haveearly-stage or late-stage HIV.  People who havesickle cell disease.  People who havehad their spleen removed (splenectomy).  People who havepoor Human resources officer.  People who havemedical conditions that increase the risk of breathing in (aspirating) secretions their own mouth and nose.   People who havea weakened immune system (immunocompromised).  People who smoke.  People whotravel to areas where pneumonia-causing germs commonly exist.  People whoare around animal habitats or animals that have pneumonia-causing germs, including birds, bats, rabbits, cats, and farm animals. SYMPTOMS Symptoms of this condition include:  Adry cough.  A wet (productive) cough.  Fever.  Sweating.  Chest pain, especially when breathing deeply or coughing.  Rapid breathing or difficulty breathing.  Shortness of breath.  Shaking chills.  Fatigue.  Muscle aches. DIAGNOSIS Your health care provider will take a medical history and perform a physical exam. You may also have other tests, including:  Imaging studies of your chest, including X-rays.  Tests to check your blood oxygen level and other blood gases.  Other tests on blood, mucus (sputum), fluid around your lungs (pleural fluid), and urine. If your pneumonia is severe, other tests may be done to identify the specific cause of your illness. TREATMENT The type of treatment that you receive  depends on many factors, such as the cause of your pneumonia, the medicines you take, and other medical conditions that you have. For most adults, treatment and recovery from pneumonia may occur at home. In some cases,  treatment must happen in a hospital. Treatment may include:  Antibiotic medicines, if the pneumonia was caused by bacteria.  Antiviral medicines, if the pneumonia was caused by a virus.  Medicines that are given by mouth or through an IV tube.  Oxygen.  Respiratory therapy. Although rare, treating severe pneumonia may include:  Mechanical ventilation. This is done if you are not breathing well on your own and you cannot maintain a safe blood oxygen level.  Thoracentesis. This procedureremoves fluid around one lung or both lungs to help you breathe better. HOME CARE INSTRUCTIONS  Take over-the-counter and prescription medicines only as told by your health care provider.  Only takecough medicine if you are losing sleep. Understand that cough medicine can prevent your body's natural ability to remove mucus from your lungs.  If you were prescribed an antibiotic medicine, take it as told by your health care provider. Do not stop taking the antibiotic even if you start to feel better.  Sleep in a semi-upright position at night. Try sleeping in a reclining chair, or place a few pillows under your head.  Do not use tobacco products, including cigarettes, chewing tobacco, and e-cigarettes. If you need help quitting, ask your health care provider.  Drink enough water to keep your urine clear or pale yellow. This will help to thin out mucus secretions in your lungs. PREVENTION There are ways that you can decrease your risk of developing community-acquired pneumonia. Consider getting a pneumococcal vaccine if:  You are older than 80 years of age.  You are older than 80 years of age and are undergoing cancer treatment, have chronic lung disease, or have other medical conditions that affect your immune system. Ask your health care provider if this applies to you. There are different types and schedules of pneumococcal vaccines. Ask your health care provider which vaccination option is best for  you. You may also prevent community-acquired pneumonia if you take these actions:  Get an influenza vaccine every year. Ask your health care provider which type of influenza vaccine is best for you.  Go to the dentist on a regular basis.  Wash your hands often. Use hand sanitizer if soap and water are not available. SEEK MEDICAL CARE IF:  You have a fever.  You are losing sleep because you cannot control your cough with cough medicine. SEEK IMMEDIATE MEDICAL CARE IF:  You have worsening shortness of breath.  You have increased chest pain.  Your sickness becomes worse, especially if you are an older adult or have a weakened immune system.  You cough up blood.   This information is not intended to replace advice given to you by your health care provider. Make sure you discuss any questions you have with your health care provider.   Document Released: 12/15/2005 Document Revised: 09/05/2015 Document Reviewed: 04/11/2015 Elsevier Interactive Patient Education [employer].

## 2016-05-19 NOTE — Progress Notes (Signed)
Subjective:    Patient ID: [name] Barker, female    DOB: Apr 27, 1921, 82 y.o.   MRN: [id number]   [name] Barker is a 82 y.o. female who presents today for an acute visit.    HPI Comments: Patient is accompanied by son in law and husband and here for evaluation of cough and 'rattle in my chest.'  Son in law reports low heartbeat. Patient does endorse dizziness for the past couple of days, better now. Worse when moving around. No syncope. No h/o MI. Denies exertional chest pain or pressure, numbness or tingling radiating to left arm or jaw, palpitations,  frequent headaches, changes in vision, or shortness of breath.    Wheezing  This is a new problem. The current episode started yesterday. The problem occurs intermittently. The problem has been unchanged. Associated symptoms include coughing. Pertinent negatives include no chest pain, chills, ear pain, fever, headaches, shortness of breath, sore throat or vomiting. The symptoms are aggravated by lying flat. She has tried OTC cough suppressant for the symptoms. The treatment provided mild relief. Her past medical history is significant for asthma and CAD. There is no history of past MI or pneumonia.   Past Medical History  Diagnosis Date  . Colon cancer ([place]) 08/19/2007    1994 T2, 2008 T3, N1  . ANEMIA-NOS 11/17/2006  . CAD 06/21/2010  . DIVERTICULOSIS-COLON 05/30/2010  . GERD 11/17/2006  . HYPERLIPIDEMIA 11/17/2006  . LOW BACK PAIN, CHRONIC 02/04/2010  . Osteopenia 11/17/2006  . Allergic rhinitis   . Small bowel obstruction ([place])   . Hemorrhoids   . TIA (transient ischemic attack)    Allergies: Flexeril; Methenamine mandelate; Morphine sulfate; Penicillins; Promethazine hcl; and Statins Current Outpatient Prescriptions on File Prior to Visit  Medication Sig Dispense Refill  . aspirin 81 MG tablet Take 81 mg by mouth daily.     . carbamide peroxide (DEBROX) 6.5 % otic solution Place 5 drops into the right ear 2 (two) times  daily. 15 mL 0  . Cholecalciferol (VITAMIN D) 2000 UNITS tablet Take by mouth daily. 5,000 IU daily    . citalopram (CELEXA) 10 MG tablet Take 1 tablet (10 mg total) by mouth daily. 30 tablet 11  . Docusate Calcium (STOOL SOFTENER PO) Take 3 tablets by mouth every evening.     . Fish Oil-Cholecalciferol (FISH OIL + D3 PO) Take by mouth.    Marland Kitchen glycerin adult (GLYCERIN ADULT) 2 G SUPP Place 1 suppository rectally daily.     Marland Kitchen HYDROcodone-acetaminophen (NORCO/VICODIN) 5-325 MG per tablet Take 0.5 tablets by mouth every 6 (six) hours as needed for moderate pain. 60 tablet 0  . PROAIR HFA 108 (90 BASE) MCG/ACT inhaler INHALE 2 PUFFS INTO THE LUNGS 2 (TWO) TIMES DAILY AS NEEDED. FOR WHEEZING 8.5 Inhaler 4  . Probiotic Product (ALIGN) 4 MG CAPS Take 1 capsule by mouth daily.     . psyllium (METAMUCIL) 58.6 % packet Take 2 packets by mouth daily.     Marland Kitchen senna-docusate (SENOKOT-S) 8.6-50 MG per tablet Take 1 tablet by mouth every evening.    . simethicone (MYLICON) 80 MG chewable tablet Chew 80 mg by mouth every 6 (six) hours as needed for flatulence.    . sodium chloride (MURO 128) 5 % ophthalmic ointment Place 1 drop into both eyes daily.    Marland Kitchen triamcinolone (KENALOG) 0.025 % ointment Apply 1 application topically 2 (two) times daily.    . VOLTAREN 1 % GEL APPLY TO AFFECTED AREA EVERY  DAY AS NEEDED BACK PAIN 400 g 5   No current facility-administered medications on file prior to visit.    Social History  Substance Use Topics  . Smoking status: Never Smoker   . Smokeless tobacco: Never Used  . Alcohol Use: No    Review of Systems  Constitutional: Negative for fever, chills and unexpected weight change.  HENT: Negative for congestion, ear pain and sore throat.   Respiratory: Positive for cough and wheezing. Negative for shortness of breath.   Cardiovascular: Positive for leg swelling. Negative for chest pain and palpitations.  Gastrointestinal: Negative for nausea and vomiting.  Musculoskeletal:  Negative for myalgias.  Neurological: Positive for dizziness. Negative for syncope and headaches.      Objective:    BP 130/70 mmHg  Pulse 47  Temp(Src) 97.4 F (36.3 C) (Oral)  Ht 4\' 11"  (1.499 m)  Wt 124 lb (56.246 kg)  BMI 25.03 kg/m2  SpO2 97%   Physical Exam  Constitutional: She appears well-developed and well-nourished.  HENT:  Head: Normocephalic and atraumatic.  Right Ear: Hearing, tympanic membrane, external ear and ear canal normal. No drainage, swelling or tenderness. No foreign bodies. Tympanic membrane is not erythematous and not bulging. No middle ear effusion. No decreased hearing is noted.  Left Ear: Hearing, tympanic membrane, external ear and ear canal normal. No drainage, swelling or tenderness. No foreign bodies. Tympanic membrane is not erythematous and not bulging.  No middle ear effusion. No decreased hearing is noted.  Nose: Nose normal. No rhinorrhea. Right sinus exhibits no maxillary sinus tenderness and no frontal sinus tenderness. Left sinus exhibits no maxillary sinus tenderness and no frontal sinus tenderness.  Mouth/Throat: Uvula is midline, oropharynx is clear and moist and mucous membranes are normal. No oropharyngeal exudate, posterior oropharyngeal edema, posterior oropharyngeal erythema or tonsillar abscesses.  Eyes: Conjunctivae are normal.  Cardiovascular: Regular rhythm, normal heart sounds and normal pulses.   Radial pulse regular.  +1 non pitting  BLE edema.  No palpable cords or masses. No erythema or increased warmth. Negative Homan sign bilaterally.  LE hair growth symmetric and present. No discoloration of varicosities noted. LE warm and palpable pedal pulses.   Pulmonary/Chest: Effort normal. She has no wheezes. She has no rhonchi. She has rales in the right lower field and the left lower field.  Lymphadenopathy:       Head (right side): No submental, no submandibular, no tonsillar, no preauricular, no posterior auricular and no  occipital adenopathy present.       Head (left side): No submental, no submandibular, no tonsillar, no preauricular, no posterior auricular and no occipital adenopathy present.    She has no cervical adenopathy.  Neurological: She is alert.  Skin: Skin is warm and dry.  Psychiatric: She has a normal mood and affect. Her speech is normal and behavior is normal. Thought content normal.  Vitals reviewed. Patient felt significantly better after albuterol treatment. Lung sounds clear and increased      Assessment & Plan:  1. Bradycardia Etiology of intermittent bradycardia is unknown at this point. Patient is not on any medications which are known to cause bradycardia. Patient is in sinus rhythm during her office visit.  EKG: Sinus rhythm. 70 bpm.  No significant changes compared to last EKG in January of this year. Ventricular ectopic beats. No marked ischemia or irregular rhythm.  Prior and today's EKG overlap very well with is very reassuring . EKG 12/2015 showed bradycardia, 51 bpm. Possible pulmonary disease noted today and  12/2015.  Patient does note dizziness with movement. She is not dizzy during exam URI.Marland KitchenAlternately, paroxysmal Afib or CHF.  She denies chest pressure, numbness tingling down left arm, palpitations.   - EKG 12-Lead -referral to cardiology for further evaluation; likely patient will need loop monitor to evaluate underlying rhythm; I have concern for afib which I was unable to find today.   2. Cough Suspect viral URI. CXR showed no acute process. CXR showed unchanged cardiomegaly supported CHF. Tortuous thoracic or aorta noted on prior and today's chest x-ray.  Decided to cover empirically with doxycycline due to fragility and co-morbidities. Note, decided not to prescribe Levaquin or azithromycin  in the setting of bradycardia.   - albuterol (PROAIR HFA) 108 (90 Base) MCG/ACT inhaler; INHALE 2 PUFFS INTO THE LUNGS 2 (TWO) TIMES DAILY AS NEEDED. FOR WHEEZING  Dispense:  8.5 Inhaler; Refill: 4 - doxycycline (VIBRA-TABS) 100 MG tablet; Take 1 tablet (100 mg total) by mouth 2 (two) times daily.  Dispense: 10 tablet; Refill: 0 - DG Chest 2 View -BNP  3. LE edema  Considering CHF. No bilateral infiltrates seen on CXR to support CHF exacerbation.  No significant weight gain. No acute respiratory distress. Mild LE edema which may be related to PVD or dependent edema. Advised compression hose. Pending BNP.   - BNP  I am having Ms. Reif maintain her ALIGN, Vitamin D, sodium chloride, Docusate Calcium (STOOL SOFTENER PO), glycerin adult, senna-docusate, aspirin, triamcinolone, VOLTAREN, HYDROcodone-acetaminophen, psyllium, Fish Oil-Cholecalciferol (FISH OIL + D3 PO), simethicone, PROAIR HFA, carbamide peroxide, and citalopram.   No orders of the defined types were placed in this encounter.     Start medications as prescribed and explained to patient on After Visit Summary ( AVS). Risks, benefits, and alternatives of the medications and treatment plan prescribed today were discussed, and patient expressed understanding.   Education regarding symptom management and diagnosis given to patient.   Follow-up:Plan follow-up as discussed or as needed if any worsening symptoms or change in condition.   Continue to follow with Hoyt Koch, MD for routine health maintenance.   Mechele Claude Tavis and I agreed with plan.   Mable Paris, FNP

## 2016-05-19 NOTE — Telephone Encounter (Signed)
Patient Name: [name] Barker  DOB: 03-07-1921    Initial Comment Caller states wife has irregular heart rate at 45, has crackling at the base of her lungs. BP 140/60. feeling general malaise, has some wheezing    Nurse Assessment  Nurse: Julien Girt, RN, [facility] Date/Time Eilene Ghazi Time): 05/19/2016 8:26:01 AM  Confirm and document reason for call. If symptomatic, describe symptoms. You must click the next button to save text entered. ---Caller states his wife has an irregular heart rate at "45", she has "crackling at the base of her lungs" and she is wheezing. Her BP is 140/60 and she feels weak. 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  Has the patient traveled out of the country within the last 30 days? ---Not Applicable  Does the patient have any new or worsening symptoms? ---Yes  Will a triage be completed? ---Yes  Related visit to physician within the last 2 weeks? ---Yes  Does the PT have any chronic conditions? (i.e. diabetes, asthma, etc.) ---Yes  List chronic conditions. ---Hx back pain, Colon Ca, Atypical chest pain, CAD  Is this a behavioral health or substance abuse call? ---No     Guidelines    Guideline Title Affirmed Question Affirmed Notes  Heart Rate and Heartbeat Questions Dizziness, lightheadedness, or weakness    Final Disposition User   Go to ED Now Julien Girt, RN, [facility] - ED   Disagree/Comply: Comply

## 2016-05-19 NOTE — Progress Notes (Signed)
Pre visit review using our clinic review tool, if applicable. No additional management support is needed unless otherwise documented below in the visit note. 

## 2016-05-20 LAB — PRO B NATRIURETIC PEPTIDE: Pro B Natriuretic peptide (BNP): 596.7 pg/mL — ABNORMAL HIGH (ref <451)

## 2016-06-03 ENCOUNTER — Ambulatory Visit: Payer: Medicare Other | Admitting: Cardiovascular Disease

## 2016-06-04 NOTE — Progress Notes (Signed)
Cardiology Office Note   Date:  06/05/2016   ID:  [name] Barker, DOB 1921-07-23, MRN [id number]  PCP:  Hoyt Koch, MD  Cardiologist:   Minus Breeding, MD   No chief complaint on file.     History of Present Illness: [name] Barker is a 82 y.o. female who presents for new patient evaluation.  She has no past cardiac history although there was a vague mention of CAD in a note.  She denies this. A couple of weeks he has some shortness of breath and there was a question of pneumonia and she was treated with antibiotics. I do note that she had an elevated BNP level at that time. However, now she says she has no shortness of breath, PND or orthopnea. She has no palpitations. She has however been noted to have a low heart rate occasionally in the 40s. She doesn't describe any dizziness with this. She did have one syncopal episode about 2 months ago. She related that to having a dose of gabapentin increased. She stood up 1 night to go to the bathroom and had syncope with a wound to her scalp. She stopped taking the gabapentin and she's had no further syncope or presyncope. She gets around in her house doing laundry. She walks with a cane. She slow but she doesn't have significant limitations with this.  Past Medical History  Diagnosis Date  . Colon cancer ([place]) 08/19/2007    1994 T2, 2008 T3, N1  . ANEMIA-NOS 11/17/2006  . DIVERTICULOSIS-COLON 05/30/2010  . GERD 11/17/2006  . HYPERLIPIDEMIA 11/17/2006  . LOW BACK PAIN, CHRONIC 02/04/2010  . Osteopenia 11/17/2006  . Allergic rhinitis   . Small bowel obstruction ([place])   . Hemorrhoids   . TIA (transient ischemic attack)     Past Surgical History  Procedure Laterality Date  . Appendectomy    . Back surgery  2007  . Hemicolectomy  1994    Right  . Abdominal hysterectomy    . Joint replacement    . Shoulder surgery    . Knee arthroscopy    . Partial colectomy  2008  . Rotator cuff repair    . Vaginal prolapse repair     . Cataract extraction    . Belpharoptosis repair       Current Outpatient Prescriptions  Medication Sig Dispense Refill  . albuterol (PROAIR HFA) 108 (90 Base) MCG/ACT inhaler INHALE 2 PUFFS INTO THE LUNGS 2 (TWO) TIMES DAILY AS NEEDED. FOR WHEEZING 8.5 Inhaler 4  . aspirin 81 MG tablet Take 81 mg by mouth daily.     . carbamide peroxide (DEBROX) 6.5 % otic solution Place 5 drops into the right ear 2 (two) times daily. 15 mL 0  . Cholecalciferol (VITAMIN D) 2000 UNITS tablet Take by mouth daily. 5,000 IU daily    . citalopram (CELEXA) 10 MG tablet Take 1 tablet (10 mg total) by mouth daily. 30 tablet 11  . Docusate Calcium (STOOL SOFTENER PO) Take 3 tablets by mouth every evening.     . Fish Oil-Cholecalciferol (FISH OIL + D3 PO) Take by mouth.    Marland Kitchen glycerin adult (GLYCERIN ADULT) 2 G SUPP Place 1 suppository rectally daily.     Marland Kitchen HYDROcodone-acetaminophen (NORCO/VICODIN) 5-325 MG per tablet Take 0.5 tablets by mouth every 6 (six) hours as needed for moderate pain. 60 tablet 0  . Probiotic Product (ALIGN) 4 MG CAPS Take 1 capsule by mouth daily.     . psyllium (  METAMUCIL) 58.6 % packet Take 2 packets by mouth daily.     Marland Kitchen senna-docusate (SENOKOT-S) 8.6-50 MG per tablet Take 1 tablet by mouth every evening.    . simethicone (MYLICON) 80 MG chewable tablet Chew 80 mg by mouth every 6 (six) hours as needed for flatulence.    . sodium chloride (MURO 128) 5 % ophthalmic ointment Place 1 drop into both eyes daily.    Marland Kitchen triamcinolone (KENALOG) 0.025 % ointment Apply 1 application topically 2 (two) times daily.    . VOLTAREN 1 % GEL APPLY TO AFFECTED AREA EVERY DAY AS NEEDED BACK PAIN 400 g 5   No current facility-administered medications for this visit.    Allergies:   Flexeril; Methenamine mandelate; Morphine sulfate; Penicillins; Promethazine hcl; and Statins    Social History:  The patient  reports that she has never smoked. She has never used smokeless tobacco. She reports that she does  not drink alcohol or use illicit drugs.   Family History:  The patient's family history includes Colon cancer (age of onset: 48) in her sister; Kidney cancer in her sister; Lung cancer in her sister.    ROS:  Please see the history of present illness.   Otherwise, review of systems are positive for none.   All other systems are reviewed and negative.    PHYSICAL EXAM: VS:  BP 142/69 mmHg  Pulse 51  Ht 4\' 11"  (1.499 m)  Wt 121 lb (54.885 kg)  BMI 24.43 kg/m2 , BMI Body mass index is 24.43 kg/(m^2). GENERAL:  Well appearing HEENT:  Pupils equal round and reactive, fundi not visualized, oral mucosa unremarkable NECK:  No jugular venous distention, waveform within normal limits, carotid upstroke brisk and symmetric, no bruits, no thyromegaly LYMPHATICS:  No cervical, inguinal adenopathy LUNGS:  Clear to auscultation bilaterally BACK:  No CVA tenderness CHEST:  Unremarkable HEART:  PMI not displaced or sustained,S1 and S2 within normal limits, no S3, no S4, no clicks, no rubs, no murmurs ABD:  Flat, positive bowel sounds normal in frequency in pitch, no bruits, no rebound, no guarding, no midline pulsatile mass, no hepatomegaly, no splenomegaly EXT:  2 plus pulses throughout, no edema, no cyanosis no clubbing SKIN:  No rashes no nodules NEURO:  Cranial nerves II through XII grossly intact, motor grossly intact throughout PSYCH:  Cognitively intact, oriented to person place and time    EKG:  EKG is ordered today. The ekg ordered today demonstrates sinus rhythm, rate 51, axis within normal limits, premature atrial contractions, no acute ST-T wave changes, early transition in lead V1   Recent Labs: 03/19/2016: Hemoglobin 11.5*; Platelets 225.0; TSH 3.00 05/19/2016: ALT 15; BUN 10; Creatinine, Ser 0.51; Potassium 4.4; Pro B Natriuretic peptide (BNP) 596.70*; Sodium 136    Lipid Panel    Component Value Date/Time   CHOL 201* 09/26/2013 0955   TRIG 98.0 09/26/2013 0955   HDL 46.90  09/26/2013 0955   CHOLHDL 4 09/26/2013 0955   VLDL 19.6 09/26/2013 0955   LDLCALC 132* 04/05/2012 0540   LDLDIRECT 133.6 09/26/2013 0955      Wt Readings from Last 3 Encounters:  06/05/16 121 lb (54.885 kg)  05/19/16 124 lb (56.246 kg)  05/16/16 124 lb (56.246 kg)      Other studies Reviewed: Additional studies/ records that were reviewed today include: Echo from 2013. Review of the above records demonstrates:  Please see elsewhere in the note.     ASSESSMENT AND PLAN:  BRADYCARDIA:  She doesn't seem  to have symptoms related to this. I will apply a 24-hour Holter monitor. Further treatment will be based on this and the presence of any prolonged bradycardia arrhythmias. Of note walking around the office today her heart rate did go up slightly with ambulation.   ELEVATED BNP:  She did have an elevated BNP but really no symptoms related to this. Her echo in 2013 was essentially normal. She doesn't seem to be volume overloaded. It probably is some element of diastolic dysfunction and her age and salt and fluid restriction would be prudent. However, I don't think further testing is indicated.   Current medicines are reviewed at length with the patient today.  The patient does not have concerns regarding medicines.  The following changes have been made:  no change  Labs/ tests ordered today include:   Orders Placed This Encounter  Procedures  . Holter monitor - 24 hour  . EKG 12-Lead     Disposition:   FU with six months.     Signed, Minus Breeding, MD  06/05/2016 9:43 AM    [facility]

## 2016-06-05 ENCOUNTER — Ambulatory Visit (INDEPENDENT_AMBULATORY_CARE_PROVIDER_SITE_OTHER): Payer: Medicare Other | Admitting: Cardiology

## 2016-06-05 ENCOUNTER — Encounter: Payer: Self-pay | Admitting: Cardiology

## 2016-06-05 VITALS — BP 142/69 | HR 51 | Ht 59.0 in | Wt 121.0 lb

## 2016-06-05 DIAGNOSIS — R001 Bradycardia, unspecified: Secondary | ICD-10-CM | POA: Diagnosis not present

## 2016-06-05 NOTE — Patient Instructions (Addendum)
Medication Instructions:  Continue current medications  Labwork: NIONE  Testing/Procedures: Your physician has recommended that you wear a holter monitor for 24 hour. Holter monitors are medical devices that record the heart's electrical activity. Doctors most often use these monitors to diagnose arrhythmias. Arrhythmias are problems with the speed or rhythm of the heartbeat. The monitor is a small, portable device. You can wear one while you do your normal daily activities. This is usually used to diagnose what is causing palpitations/syncope (passing out).  Follow-Up:  Your physician wants you to follow-up in: 6 Months. You will receive a reminder letter in the mail two months in advance. If you don't receive a letter, please call our office to schedule the follow-up appointment.   Any Other Special Instructions Will Be Listed Below (If Applicable).   If you need a refill on your cardiac medications before your next appointment, please call your pharmacy.

## 2016-06-06 ENCOUNTER — Ambulatory Visit (INDEPENDENT_AMBULATORY_CARE_PROVIDER_SITE_OTHER): Payer: Medicare Other

## 2016-06-06 DIAGNOSIS — R001 Bradycardia, unspecified: Secondary | ICD-10-CM | POA: Diagnosis not present

## 2016-06-16 ENCOUNTER — Ambulatory Visit: Payer: Medicare Other | Admitting: Cardiovascular Disease

## 2016-06-25 DIAGNOSIS — N898 Other specified noninflammatory disorders of vagina: Secondary | ICD-10-CM | POA: Diagnosis not present

## 2016-07-07 ENCOUNTER — Other Ambulatory Visit: Payer: Self-pay | Admitting: Internal Medicine

## 2016-07-07 DIAGNOSIS — Z1231 Encounter for screening mammogram for malignant neoplasm of breast: Secondary | ICD-10-CM

## 2016-07-08 DIAGNOSIS — Z79891 Long term (current) use of opiate analgesic: Secondary | ICD-10-CM | POA: Diagnosis not present

## 2016-07-08 DIAGNOSIS — R1030 Lower abdominal pain, unspecified: Secondary | ICD-10-CM | POA: Diagnosis not present

## 2016-07-08 DIAGNOSIS — M4608 Spinal enthesopathy, sacral and sacrococcygeal region: Secondary | ICD-10-CM | POA: Diagnosis not present

## 2016-07-08 DIAGNOSIS — G894 Chronic pain syndrome: Secondary | ICD-10-CM | POA: Diagnosis not present

## 2016-07-09 ENCOUNTER — Ambulatory Visit: Payer: Medicare Other | Admitting: Cardiovascular Disease

## 2016-07-31 DIAGNOSIS — L723 Sebaceous cyst: Secondary | ICD-10-CM | POA: Diagnosis not present

## 2016-07-31 DIAGNOSIS — N898 Other specified noninflammatory disorders of vagina: Secondary | ICD-10-CM | POA: Diagnosis not present

## 2016-08-06 ENCOUNTER — Ambulatory Visit: Payer: Medicare Other

## 2016-08-06 ENCOUNTER — Ambulatory Visit
Admission: RE | Admit: 2016-08-06 | Discharge: 2016-08-06 | Disposition: A | Discharge status: Home / Self Care | Payer: Medicare Other | Source: Ambulatory Visit | Attending: Internal Medicine | Admitting: Internal Medicine

## 2016-08-06 DIAGNOSIS — Z1231 Encounter for screening mammogram for malignant neoplasm of breast: Secondary | ICD-10-CM | POA: Diagnosis not present

## 2016-08-07 ENCOUNTER — Encounter: Payer: Self-pay | Admitting: Internal Medicine

## 2016-08-07 ENCOUNTER — Ambulatory Visit (INDEPENDENT_AMBULATORY_CARE_PROVIDER_SITE_OTHER): Payer: Medicare Other | Admitting: Internal Medicine

## 2016-08-07 DIAGNOSIS — F329 Major depressive disorder, single episode, unspecified: Secondary | ICD-10-CM

## 2016-08-07 DIAGNOSIS — F32A Depression, unspecified: Secondary | ICD-10-CM

## 2016-08-07 DIAGNOSIS — H9193 Unspecified hearing loss, bilateral: Secondary | ICD-10-CM | POA: Diagnosis not present

## 2016-08-07 NOTE — Progress Notes (Signed)
Pre visit review using our clinic review tool, if applicable. No additional management support is needed unless otherwise documented below in the visit note. 

## 2016-08-07 NOTE — Assessment & Plan Note (Signed)
Improved, to cont the celexa for now, tolerating well, f/u with PCP

## 2016-08-07 NOTE — Patient Instructions (Signed)
Unfortunately, we were unable to completely clear the wax in your ears  Please refer yourself to the ENT as we discussed.    Please continue all other medications as before, and refills have been done if requested.  Please have the pharmacy call with any other refills you may need.  Please keep your appointments with your specialists as you may have planned

## 2016-08-07 NOTE — Progress Notes (Signed)
Subjective:    Patient ID: [name] Barker, female    DOB: 1921-12-11, 82 y.o.   MRN: [id number]  HPI  Here to f/u with c/o bilat hearing loss, worse in last 2 wks, similar to prior episodes wax impaction.  Has appt with audiology soon, would like to have accurated exam.  No HA, fever, ST,, cough or sinus complaints.  Pt denies chest pain, increased sob or doe, wheezing, orthopnea, PND, increased LE swelling, palpitations, dizziness or syncope.   Also, Denies worsening depressive symptoms, suicidal ideation, or panic; has ongoing anxiety, not increased recently. Celexa helped greatly, pt quite pleased. Past Medical History:  Diagnosis Date  . Allergic rhinitis   . ANEMIA-NOS 11/17/2006  . Colon cancer ([place]) 08/19/2007   1994 T2, 2008 T3, N1  . DIVERTICULOSIS-COLON 05/30/2010  . GERD 11/17/2006  . Hemorrhoids   . HYPERLIPIDEMIA 11/17/2006  . LOW BACK PAIN, CHRONIC 02/04/2010  . Osteopenia 11/17/2006  . Small bowel obstruction ([place])   . TIA (transient ischemic attack)    Past Surgical History:  Procedure Laterality Date  . ABDOMINAL HYSTERECTOMY    . APPENDECTOMY    . BACK SURGERY  2007  . BELPHAROPTOSIS REPAIR    . CATARACT EXTRACTION    . HEMICOLECTOMY  1994   Right  . JOINT REPLACEMENT    . KNEE ARTHROSCOPY    . PARTIAL COLECTOMY  2008  . ROTATOR CUFF REPAIR    . SHOULDER SURGERY    . VAGINAL PROLAPSE REPAIR      reports that she has never smoked. She has never used smokeless tobacco. She reports that she does not drink alcohol or use drugs. family history includes Colon cancer (age of onset: 57) in her sister; Kidney cancer in her sister; Lung cancer in her sister. Allergies  Allergen Reactions  . Flexeril [Cyclobenzaprine]     nightmares  . Methenamine Mandelate     REACTION: burning sensation  . Morphine Sulfate     REACTION: blister  . Penicillins     REACTION: urticaria (hives)  . Promethazine Hcl     REACTION: hallucinations  . Statins Other (See Comments)    Muscle pain    Current Outpatient Prescriptions on File Prior to Visit  Medication Sig Dispense Refill  . albuterol (PROAIR HFA) 108 (90 Base) MCG/ACT inhaler INHALE 2 PUFFS INTO THE LUNGS 2 (TWO) TIMES DAILY AS NEEDED. FOR WHEEZING 8.5 Inhaler 4  . aspirin 81 MG tablet Take 81 mg by mouth daily.     . carbamide peroxide (DEBROX) 6.5 % otic solution Place 5 drops into the right ear 2 (two) times daily. 15 mL 0  . Cholecalciferol (VITAMIN D) 2000 UNITS tablet Take by mouth daily. 5,000 IU daily    . citalopram (CELEXA) 10 MG tablet Take 1 tablet (10 mg total) by mouth daily. 30 tablet 11  . Docusate Calcium (STOOL SOFTENER PO) Take 3 tablets by mouth every evening.     . Fish Oil-Cholecalciferol (FISH OIL + D3 PO) Take by mouth.    Marland Kitchen glycerin adult (GLYCERIN ADULT) 2 G SUPP Place 1 suppository rectally daily.     Marland Kitchen HYDROcodone-acetaminophen (NORCO/VICODIN) 5-325 MG per tablet Take 0.5 tablets by mouth every 6 (six) hours as needed for moderate pain. 60 tablet 0  . Probiotic Product (ALIGN) 4 MG CAPS Take 1 capsule by mouth daily.     . psyllium (METAMUCIL) 58.6 % packet Take 2 packets by mouth daily.     Marland Kitchen senna-docusate (SENOKOT-S)  8.6-50 MG per tablet Take 1 tablet by mouth every evening.    . simethicone (MYLICON) 80 MG chewable tablet Chew 80 mg by mouth every 6 (six) hours as needed for flatulence.    . sodium chloride (MURO 128) 5 % ophthalmic ointment Place 1 drop into both eyes daily.    Marland Kitchen triamcinolone (KENALOG) 0.025 % ointment Apply 1 application topically 2 (two) times daily.    . VOLTAREN 1 % GEL APPLY TO AFFECTED AREA EVERY DAY AS NEEDED BACK PAIN 400 g 5   No current facility-administered medications on file prior to visit.     Review of Systems  All otherwise neg per pt      Objective:   Physical Exam BP 140/72   Pulse 68   Temp 97.9 F (36.6 C) (Oral)   Resp 20   Wt 118 lb (53.5 kg)   SpO2 95%   BMI 23.83 kg/m  VS noted,  Constitutional: Pt appears in no  apparent distress HENT: Head: NCAT.  Right Ear: External ear normal.  Left Ear: External ear normal.  Eyes: . Pupils are equal, round, and reactive to light. Conjunctivae and EOM are normal Bilat canals with wax impactions unable to be irrigated clear, hearing still reduced Neck: Normal range of motion. Neck supple.  Cardiovascular: Normal rate and regular rhythm.   Pulmonary/Chest: Effort normal and breath sounds without rales or wheezing.  Neurological: Pt is alert. Not confused , motor grossly intact Skin: Skin is warm. No rash, no LE edema Psychiatric: Pt behavior is normal. No agitation. not depressed or nervous appearing, jokiing with [place]     Assessment & Plan:

## 2016-08-07 NOTE — Assessment & Plan Note (Signed)
Moderate, unable to clear wax impactin, pt states she will self refer to ENT tomorrow whom she has seen in the past, then f/u with audiology as planned

## 2016-08-08 DIAGNOSIS — H6123 Impacted cerumen, bilateral: Secondary | ICD-10-CM | POA: Diagnosis not present

## 2016-08-08 DIAGNOSIS — Z974 Presence of external hearing-aid: Secondary | ICD-10-CM | POA: Diagnosis not present

## 2016-08-08 DIAGNOSIS — H903 Sensorineural hearing loss, bilateral: Secondary | ICD-10-CM | POA: Diagnosis not present

## 2016-08-26 DIAGNOSIS — H903 Sensorineural hearing loss, bilateral: Secondary | ICD-10-CM | POA: Diagnosis not present

## 2016-08-28 DIAGNOSIS — R202 Paresthesia of skin: Secondary | ICD-10-CM | POA: Diagnosis not present

## 2016-08-28 DIAGNOSIS — D171 Benign lipomatous neoplasm of skin and subcutaneous tissue of trunk: Secondary | ICD-10-CM | POA: Diagnosis not present

## 2016-08-28 DIAGNOSIS — L821 Other seborrheic keratosis: Secondary | ICD-10-CM | POA: Diagnosis not present

## 2016-09-17 DIAGNOSIS — Z23 Encounter for immunization: Secondary | ICD-10-CM | POA: Diagnosis not present

## 2016-09-23 DIAGNOSIS — M4608 Spinal enthesopathy, sacral and sacrococcygeal region: Secondary | ICD-10-CM | POA: Diagnosis not present

## 2016-09-23 DIAGNOSIS — R1030 Lower abdominal pain, unspecified: Secondary | ICD-10-CM | POA: Diagnosis not present

## 2016-09-23 DIAGNOSIS — Z79891 Long term (current) use of opiate analgesic: Secondary | ICD-10-CM | POA: Diagnosis not present

## 2016-09-23 DIAGNOSIS — G894 Chronic pain syndrome: Secondary | ICD-10-CM | POA: Diagnosis not present

## 2016-09-25 DIAGNOSIS — N390 Urinary tract infection, site not specified: Secondary | ICD-10-CM | POA: Diagnosis not present

## 2016-09-25 DIAGNOSIS — R102 Pelvic and perineal pain: Secondary | ICD-10-CM | POA: Diagnosis not present

## 2016-09-25 DIAGNOSIS — N898 Other specified noninflammatory disorders of vagina: Secondary | ICD-10-CM | POA: Diagnosis not present

## 2016-10-30 ENCOUNTER — Encounter: Payer: Self-pay | Admitting: Internal Medicine

## 2016-11-11 ENCOUNTER — Ambulatory Visit: Payer: Medicare Other | Admitting: Internal Medicine

## 2016-11-17 ENCOUNTER — Ambulatory Visit: Payer: Medicare Other | Admitting: Internal Medicine

## 2016-12-16 DIAGNOSIS — R1030 Lower abdominal pain, unspecified: Secondary | ICD-10-CM | POA: Diagnosis not present

## 2016-12-16 DIAGNOSIS — Z79891 Long term (current) use of opiate analgesic: Secondary | ICD-10-CM | POA: Diagnosis not present

## 2016-12-16 DIAGNOSIS — M4608 Spinal enthesopathy, sacral and sacrococcygeal region: Secondary | ICD-10-CM | POA: Diagnosis not present

## 2016-12-16 DIAGNOSIS — G894 Chronic pain syndrome: Secondary | ICD-10-CM | POA: Diagnosis not present

## 2016-12-19 ENCOUNTER — Telehealth: Payer: Self-pay | Admitting: Internal Medicine

## 2016-12-19 ENCOUNTER — Ambulatory Visit (INDEPENDENT_AMBULATORY_CARE_PROVIDER_SITE_OTHER): Payer: Medicare Other | Admitting: Family

## 2016-12-19 ENCOUNTER — Encounter: Payer: Self-pay | Admitting: Family

## 2016-12-19 VITALS — BP 162/82 | HR 69 | Temp 97.7°F | Resp 16 | Ht 59.0 in | Wt 116.0 lb

## 2016-12-19 DIAGNOSIS — J069 Acute upper respiratory infection, unspecified: Secondary | ICD-10-CM | POA: Diagnosis not present

## 2016-12-19 MED ORDER — PREDNISONE 10 MG (21) PO TBPK: ORAL_TABLET | ORAL | 0 refills | Status: DC

## 2016-12-19 MED ORDER — BUDESONIDE-FORMOTEROL FUMARATE 160-4.5 MCG/ACT IN AERO: 2.0000 | INHALATION_SPRAY | Freq: Two times a day (BID) | RESPIRATORY_TRACT | 0 refills | Status: DC

## 2016-12-19 NOTE — Progress Notes (Signed)
Subjective:    Patient ID: [name] Barker, female    DOB: [birth year], 82 y.o.   MRN: [id number]  Chief Complaint  Patient presents with  . Cough    x3 days bad cough, congestion, and wheezing    HPI:  [name] Barker is a 82 y.o. female who  has a past medical history of Allergic rhinitis; ANEMIA-NOS (11/17/2006); Colon cancer ([place]) (08/19/2007); DIVERTICULOSIS-COLON (05/30/2010); GERD (11/17/2006); Hemorrhoids; HYPERLIPIDEMIA (11/17/2006); LOW BACK PAIN, CHRONIC (02/04/2010); Osteopenia (11/17/2006); Small bowel obstruction; and TIA (transient ischemic attack). and presents today for an acute office visit.  This is a new problem. Associated symptoms of cough, congestion and wheezing that has been going on for about 3 days. No fevers. Modifying factors include 40 mg of prednisone which has helped a little. Nasal discharge described as clear and copious. Other modifying factors include Robtussin. No recent antibiotics. No one around her sick.    Allergies  Allergen Reactions  . Flexeril [Cyclobenzaprine]     nightmares  . Methenamine Mandelate     REACTION: burning sensation  . Morphine Sulfate     REACTION: blister  . Penicillins     REACTION: urticaria (hives)  . Promethazine Hcl     REACTION: hallucinations  . Statins Other (See Comments)    Muscle pain       Outpatient Medications Prior to Visit  Medication Sig Dispense Refill  . albuterol (PROAIR HFA) 108 (90 Base) MCG/ACT inhaler INHALE 2 PUFFS INTO THE LUNGS 2 (TWO) TIMES DAILY AS NEEDED. FOR WHEEZING 8.5 Inhaler 4  . aspirin 81 MG tablet Take 81 mg by mouth daily.     . carbamide peroxide (DEBROX) 6.5 % otic solution Place 5 drops into the right ear 2 (two) times daily. 15 mL 0  . Cholecalciferol (VITAMIN D) 2000 UNITS tablet Take by mouth daily. 5,000 IU daily    . citalopram (CELEXA) 10 MG tablet Take 1 tablet (10 mg total) by mouth daily. 30 tablet 11  . Docusate Calcium (STOOL SOFTENER PO) Take 3 tablets by  mouth every evening.     . Fish Oil-Cholecalciferol (FISH OIL + D3 PO) Take by mouth.    Marland Kitchen glycerin adult (GLYCERIN ADULT) 2 G SUPP Place 1 suppository rectally daily.     Marland Kitchen HYDROcodone-acetaminophen (NORCO/VICODIN) 5-325 MG per tablet Take 0.5 tablets by mouth every 6 (six) hours as needed for moderate pain. 60 tablet 0  . Probiotic Product (ALIGN) 4 MG CAPS Take 1 capsule by mouth daily.     . psyllium (METAMUCIL) 58.6 % packet Take 2 packets by mouth daily.     Marland Kitchen senna-docusate (SENOKOT-S) 8.6-50 MG per tablet Take 1 tablet by mouth every evening.    . simethicone (MYLICON) 80 MG chewable tablet Chew 80 mg by mouth every 6 (six) hours as needed for flatulence.    . sodium chloride (MURO 128) 5 % ophthalmic ointment Place 1 drop into both eyes daily.    Marland Kitchen triamcinolone (KENALOG) 0.025 % ointment Apply 1 application topically 2 (two) times daily.    . VOLTAREN 1 % GEL APPLY TO AFFECTED AREA EVERY DAY AS NEEDED BACK PAIN 400 g 5   No facility-administered medications prior to visit.      Review of Systems  Constitutional: Negative for chills and fever.  HENT: Positive for congestion, rhinorrhea and sneezing. Negative for sinus pain and sinus pressure.   Respiratory: Positive for cough and wheezing.       Objective:  BP (!) 162/82 (BP Location: Left Arm, Patient Position: Sitting, Cuff Size: Normal)   Pulse 69   Temp 97.7 F (36.5 C) (Oral)   Resp 16   Ht 4\' 11"  (1.499 m)   Wt 116 lb (52.6 kg)   SpO2 96%   BMI 23.43 kg/m  Nursing note and vital signs reviewed.  Physical Exam  Constitutional: She is oriented to person, place, and time. She appears well-developed and well-nourished. No distress.  HENT:  Right Ear: Hearing, tympanic membrane, external ear and ear canal normal.  Left Ear: Hearing, tympanic membrane, external ear and ear canal normal.  Nose: Nose normal.  Mouth/Throat: Uvula is midline, oropharynx is clear and moist and mucous membranes are normal.  Hearing  aids in place  Cardiovascular: Normal rate, regular rhythm, normal heart sounds and intact distal pulses.   Pulmonary/Chest: Effort normal and breath sounds normal.  Neurological: She is alert and oriented to person, place, and time.  Skin: Skin is warm and dry.  Psychiatric: She has a normal mood and affect. Her behavior is normal. Judgment and thought content normal.       Assessment & Plan:   Problem List Items Addressed This Visit      Respiratory   Acute upper respiratory infection - Primary    Symptoms and exam consistent with acute upper respiratory infection and allergic rhinitis. Recommend over-the-counter second-generation antihistamine as needed for symptom relief. Start prednisone Dosepak. Sample prescription for Symbicort provided. Continue over-the-counter medications as needed for symptom relief and supportive care. Follow-up if symptoms worsen or do not improve.      Relevant Medications   predniSONE (STERAPRED UNI-PAK 21 TAB) 10 MG (21) TBPK tablet   budesonide-formoterol (SYMBICORT) 160-4.5 MCG/ACT inhaler       I am having Ms. Fortner start on predniSONE and budesonide-formoterol. I am also having her maintain her ALIGN, Vitamin D, sodium chloride, Docusate Calcium (STOOL SOFTENER PO), glycerin adult, senna-docusate, aspirin, triamcinolone, VOLTAREN, HYDROcodone-acetaminophen, psyllium, Fish Oil-Cholecalciferol (FISH OIL + D3 PO), simethicone, carbamide peroxide, citalopram, and albuterol.   Meds ordered this encounter  Medications  . predniSONE (STERAPRED UNI-PAK 21 TAB) 10 MG (21) TBPK tablet    Sig: Take 6 tablets x 1 day, 5 tablets x 1 day, 4 tablets x 1 day, 3 tablets x 1 day, 2 tablets x 1 day, 1 tablet x 1 day    Dispense:  21 tablet    Refill:  0    Order Specific Question:   Supervising Provider    Answer:   Pricilla Holm A [id number]  . budesonide-formoterol (SYMBICORT) 160-4.5 MCG/ACT inhaler    Sig: Inhale 2 puffs into the lungs 2 (two) times  daily.    Dispense:  1 Inhaler    Refill:  0    Order Specific Question:   Supervising Provider    Answer:   Pricilla Holm A [id number]     Follow-up: Return if symptoms worsen or fail to improve.  Mauricio Po, FNP

## 2016-12-19 NOTE — Telephone Encounter (Signed)
PLEASE NOTE: All timestamps contained within this report are represented as [place] Standard Time. CONFIDENTIALTY NOTICE: This fax transmission is intended only for the addressee. It contains information that is legally privileged, confidential or otherwise protected from use or disclosure. If you are not the intended recipient, you are strictly prohibited from reviewing, disclosing, copying using or disseminating any of this information or taking any action in reliance on or regarding this information. If you have received this fax in error, please notify us immediately by telephone so that we can arrange for its return to [place]. Phone: [phone], Toll-Free: [phone], Fax: [phone] Page: 1 of 1 Call Id: [id number] [facility] - Client [place] Patient Name: [name] Barker DOB: May 05, 1921 Initial Comment Caller states his wife has a cough. She's wheezing. Nurse Assessment Nurse: Dimas Chyle, RN, Dellis Filbert Date/Time Eilene Ghazi Time): 12/19/2016 8:01:49 AM Confirm and document reason for call. If symptomatic, describe symptoms. ---Caller states his wife has a cough. She's wheezing. Coughing for 2 days. No fever. Non productive cough. Does the patient have any new or worsening symptoms? ---Yes Will a triage be completed? ---Yes Related visit to physician within the last 2 weeks? ---No Does the PT have any chronic conditions? (i.e. diabetes, asthma, etc.) ---Yes List chronic conditions. ---Chronic back pain, asthma Is this a behavioral health or substance abuse call? ---No Guidelines Guideline Title Affirmed Question Affirmed Notes Asthma Attack [1] MILD asthma attack (e.g., no SOB at rest, mild SOB with walking, speaks normally in sentences, mild wheezing) AND [2] persists > 24 hours on appropriate treatment Final Disposition User See Physician within [place], RN, [employer] Referrals REFERRED TO PCP  OFFICE Disagree/Comply: Leta Baptist

## 2016-12-19 NOTE — Patient Instructions (Signed)
Thank you for choosing [facility].  SUMMARY AND INSTRUCTIONS:  Please continue to take your medications as prescribed.  Recommend Xyzal, Zyrtec, Claratin or Allegra as needed.  Symbicort 2 inhalations 2x daily for the next 3-5 days.   Prensidone taper if needed.   Medication:  Your prescription(s) have been submitted to your pharmacy or been printed and provided for you. Please take as directed and contact our office if you believe you are having problem(s) with the medication(s) or have any questions.  Follow up:  If your symptoms worsen or fail to improve, please contact our office for further instruction, or in case of emergency go directly to the emergency room at the closest medical facility.   General Recommendations:    Please drink plenty of fluids.  Get plenty of rest   Sleep in humidified air  Use saline nasal sprays  Netti pot   OTC Medications:  Decongestants - helps relieve congestion   Flonase (generic fluticasone) or Nasacort (generic triamcinolone) - please make sure to use the "cross-over" technique at a 45 degree angle towards the opposite eye as opposed to straight up the nasal passageway.   Sudafed (generic pseudoephedrine - Note this is the one that is available behind the pharmacy counter); Products with phenylephrine (-PE) may also be used but is often not as effective as pseudoephedrine.   If you have HIGH BLOOD PRESSURE - Coricidin HBP; AVOID any product that is -D as this contains pseudoephedrine which may increase your blood pressure.  Afrin (oxymetazoline) every 6-8 hours for up to 3 days.   Allergies - helps relieve runny nose, itchy eyes and sneezing   Claritin (generic loratidine), Allegra (fexofenidine), or Zyrtec (generic cyrterizine) for runny nose. These medications should not cause drowsiness.  Note - Benadryl (generic diphenhydramine) may be used however may cause drowsiness  Cough -   Delsym or Robitussin (generic  dextromethorphan)  Expectorants - helps loosen mucus to ease removal   Mucinex (generic guaifenesin) as directed on the package.  Headaches / General Aches   Tylenol (generic acetaminophen) - DO NOT EXCEED 3 grams (3,000 mg) in a 24 hour time period  Advil/Motrin (generic ibuprofen)   Sore Throat -   Salt water gargle   Chloraseptic (generic benzocaine) spray or lozenges / Sucrets (generic dyclonine)

## 2016-12-19 NOTE — Assessment & Plan Note (Signed)
Symptoms and exam consistent with acute upper respiratory infection and allergic rhinitis. Recommend over-the-counter second-generation antihistamine as needed for symptom relief. Start prednisone Dosepak. Sample prescription for Symbicort provided. Continue over-the-counter medications as needed for symptom relief and supportive care. Follow-up if symptoms worsen or do not improve.

## 2017-01-28 DIAGNOSIS — M25512 Pain in left shoulder: Secondary | ICD-10-CM | POA: Diagnosis not present

## 2017-01-28 DIAGNOSIS — Z96652 Presence of left artificial knee joint: Secondary | ICD-10-CM | POA: Diagnosis not present

## 2017-01-28 DIAGNOSIS — M7532 Calcific tendinitis of left shoulder: Secondary | ICD-10-CM | POA: Diagnosis not present

## 2017-01-28 DIAGNOSIS — T8484XA Pain due to internal orthopedic prosthetic devices, implants and grafts, initial encounter: Secondary | ICD-10-CM | POA: Diagnosis not present

## 2017-02-04 DIAGNOSIS — R300 Dysuria: Secondary | ICD-10-CM | POA: Diagnosis not present

## 2017-02-10 DIAGNOSIS — R1030 Lower abdominal pain, unspecified: Secondary | ICD-10-CM | POA: Diagnosis not present

## 2017-02-10 DIAGNOSIS — Z79891 Long term (current) use of opiate analgesic: Secondary | ICD-10-CM | POA: Diagnosis not present

## 2017-02-10 DIAGNOSIS — G894 Chronic pain syndrome: Secondary | ICD-10-CM | POA: Diagnosis not present

## 2017-02-10 DIAGNOSIS — M4608 Spinal enthesopathy, sacral and sacrococcygeal region: Secondary | ICD-10-CM | POA: Diagnosis not present

## 2017-03-16 DIAGNOSIS — Z961 Presence of intraocular lens: Secondary | ICD-10-CM | POA: Diagnosis not present

## 2017-03-16 DIAGNOSIS — H35373 Puckering of macula, bilateral: Secondary | ICD-10-CM | POA: Diagnosis not present

## 2017-03-16 DIAGNOSIS — H1851 Endothelial corneal dystrophy: Secondary | ICD-10-CM | POA: Diagnosis not present

## 2017-03-17 DIAGNOSIS — M25512 Pain in left shoulder: Secondary | ICD-10-CM | POA: Diagnosis not present

## 2017-03-17 DIAGNOSIS — Z96652 Presence of left artificial knee joint: Secondary | ICD-10-CM | POA: Diagnosis not present

## 2017-03-17 DIAGNOSIS — T8484XD Pain due to internal orthopedic prosthetic devices, implants and grafts, subsequent encounter: Secondary | ICD-10-CM | POA: Diagnosis not present

## 2017-03-17 DIAGNOSIS — M7532 Calcific tendinitis of left shoulder: Secondary | ICD-10-CM | POA: Diagnosis not present

## 2017-03-25 DIAGNOSIS — M7532 Calcific tendinitis of left shoulder: Secondary | ICD-10-CM | POA: Diagnosis not present

## 2017-03-25 DIAGNOSIS — N898 Other specified noninflammatory disorders of vagina: Secondary | ICD-10-CM | POA: Diagnosis not present

## 2017-03-31 DIAGNOSIS — M7532 Calcific tendinitis of left shoulder: Secondary | ICD-10-CM | POA: Diagnosis not present

## 2017-04-02 DIAGNOSIS — M7532 Calcific tendinitis of left shoulder: Secondary | ICD-10-CM | POA: Diagnosis not present

## 2017-04-06 DIAGNOSIS — M7532 Calcific tendinitis of left shoulder: Secondary | ICD-10-CM | POA: Diagnosis not present

## 2017-04-07 DIAGNOSIS — M1711 Unilateral primary osteoarthritis, right knee: Secondary | ICD-10-CM | POA: Diagnosis not present

## 2017-04-07 DIAGNOSIS — G8929 Other chronic pain: Secondary | ICD-10-CM | POA: Diagnosis not present

## 2017-04-07 DIAGNOSIS — M25562 Pain in left knee: Secondary | ICD-10-CM | POA: Diagnosis not present

## 2017-04-09 ENCOUNTER — Encounter (INDEPENDENT_AMBULATORY_CARE_PROVIDER_SITE_OTHER): Payer: Self-pay | Admitting: Ophthalmology

## 2017-04-09 DIAGNOSIS — M7532 Calcific tendinitis of left shoulder: Secondary | ICD-10-CM | POA: Diagnosis not present

## 2017-04-14 DIAGNOSIS — M7532 Calcific tendinitis of left shoulder: Secondary | ICD-10-CM | POA: Diagnosis not present

## 2017-04-16 DIAGNOSIS — M7532 Calcific tendinitis of left shoulder: Secondary | ICD-10-CM | POA: Diagnosis not present

## 2017-04-21 DIAGNOSIS — M7532 Calcific tendinitis of left shoulder: Secondary | ICD-10-CM | POA: Diagnosis not present

## 2017-04-21 DIAGNOSIS — M25512 Pain in left shoulder: Secondary | ICD-10-CM | POA: Diagnosis not present

## 2017-04-27 ENCOUNTER — Encounter (INDEPENDENT_AMBULATORY_CARE_PROVIDER_SITE_OTHER): Payer: Medicare Other | Admitting: Ophthalmology

## 2017-04-27 DIAGNOSIS — D3131 Benign neoplasm of right choroid: Secondary | ICD-10-CM

## 2017-04-27 DIAGNOSIS — H43813 Vitreous degeneration, bilateral: Secondary | ICD-10-CM

## 2017-04-27 DIAGNOSIS — H35372 Puckering of macula, left eye: Secondary | ICD-10-CM | POA: Diagnosis not present

## 2017-05-05 DIAGNOSIS — M25561 Pain in right knee: Secondary | ICD-10-CM | POA: Diagnosis not present

## 2017-05-05 DIAGNOSIS — G894 Chronic pain syndrome: Secondary | ICD-10-CM | POA: Diagnosis not present

## 2017-05-05 DIAGNOSIS — M25512 Pain in left shoulder: Secondary | ICD-10-CM | POA: Diagnosis not present

## 2017-05-05 DIAGNOSIS — Z79891 Long term (current) use of opiate analgesic: Secondary | ICD-10-CM | POA: Diagnosis not present

## 2017-05-26 ENCOUNTER — Other Ambulatory Visit: Payer: Self-pay | Admitting: Internal Medicine

## 2017-06-04 ENCOUNTER — Other Ambulatory Visit: Payer: Self-pay | Admitting: Internal Medicine

## 2017-06-16 ENCOUNTER — Encounter: Payer: Self-pay | Admitting: Family

## 2017-06-16 ENCOUNTER — Ambulatory Visit (INDEPENDENT_AMBULATORY_CARE_PROVIDER_SITE_OTHER): Payer: Medicare Other | Admitting: Family

## 2017-06-16 VITALS — BP 136/80 | HR 56 | Temp 98.2°F | Resp 14 | Ht 59.0 in | Wt 118.0 lb

## 2017-06-16 DIAGNOSIS — F3342 Major depressive disorder, recurrent, in full remission: Secondary | ICD-10-CM

## 2017-06-16 NOTE — Assessment & Plan Note (Signed)
Depression appears stable with no adverse side effects or suicidal ideations. No psychotic features or hallucinations. Continue current dosage of Celexa. Continue to monitor.

## 2017-06-16 NOTE — Progress Notes (Signed)
Subjective:    Patient ID: [name] Barker, female    DOB: 03/01/1921, 83 y.o.   MRN: [id number]  Chief Complaint  Patient presents with  . Medication Refill    was told she could not get refills on meds until she had a visit but does not currently need refills    HPI:  [name] Barker is a 83 y.o. female who  has a past medical history of Allergic rhinitis; ANEMIA-NOS (11/17/2006); Colon cancer ([place]) (08/19/2007); DIVERTICULOSIS-COLON (05/30/2010); GERD (11/17/2006); Hemorrhoids; HYPERLIPIDEMIA (11/17/2006); LOW BACK PAIN, CHRONIC (02/04/2010); Osteopenia (11/17/2006); Small bowel obstruction ([place]); and TIA (transient ischemic attack). and presents today for a follow up office visit.  1.) Depression - Currently maintained on Celexa. Reports taking the medication as prescribed and denies adverse side effects. Endorses that it keeps her good spirits. Denies suicidal ideation.   Allergies  Allergen Reactions  . Flexeril [Cyclobenzaprine]     nightmares  . Methenamine Mandelate     REACTION: burning sensation  . Morphine Sulfate     REACTION: blister  . Penicillins     REACTION: urticaria (hives)  . Promethazine Hcl     REACTION: hallucinations  . Statins Other (See Comments)    Muscle pain       Outpatient Medications Prior to Visit  Medication Sig Dispense Refill  . albuterol (PROAIR HFA) 108 (90 Base) MCG/ACT inhaler INHALE 2 PUFFS INTO THE LUNGS 2 (TWO) TIMES DAILY AS NEEDED. FOR WHEEZING 8.5 Inhaler 4  . aspirin 81 MG tablet Take 81 mg by mouth daily.     . carbamide peroxide (DEBROX) 6.5 % otic solution Place 5 drops into the right ear 2 (two) times daily. 15 mL 0  . Cholecalciferol (VITAMIN D) 2000 UNITS tablet Take by mouth daily. 5,000 IU daily    . citalopram (CELEXA) 10 MG tablet TAKE 1 TABLET EVERY DAY 30 tablet 0  . clindamycin (CLEOCIN) 150 MG capsule TAKE 4 CAPSULES 1 HOUR BEFORE APPOINTMENT 12 capsule 0  . Fish Oil-Cholecalciferol (FISH OIL + D3 PO) Take by  mouth.    Marland Kitchen glycerin adult (GLYCERIN ADULT) 2 G SUPP Place 1 suppository rectally daily.     . Probiotic Product (ALIGN) 4 MG CAPS Take 1 capsule by mouth daily.     Marland Kitchen senna-docusate (SENOKOT-S) 8.6-50 MG per tablet Take 1 tablet by mouth every evening.    . sodium chloride (MURO 128) 5 % ophthalmic ointment Place 1 drop into both eyes daily.    Marland Kitchen triamcinolone (KENALOG) 0.025 % ointment Apply 1 application topically 2 (two) times daily.    . VOLTAREN 1 % GEL APPLY TO AFFECTED AREA EVERY DAY AS NEEDED BACK PAIN 400 g 5  . HYDROcodone-acetaminophen (NORCO/VICODIN) 5-325 MG per tablet Take 0.5 tablets by mouth every 6 (six) hours as needed for moderate pain. 60 tablet 0  . budesonide-formoterol (SYMBICORT) 160-4.5 MCG/ACT inhaler Inhale 2 puffs into the lungs 2 (two) times daily. 1 Inhaler 0  . Docusate Calcium (STOOL SOFTENER PO) Take 3 tablets by mouth every evening.     . predniSONE (STERAPRED UNI-PAK 21 TAB) 10 MG (21) TBPK tablet Take 6 tablets x 1 day, 5 tablets x 1 day, 4 tablets x 1 day, 3 tablets x 1 day, 2 tablets x 1 day, 1 tablet x 1 day 21 tablet 0  . psyllium (METAMUCIL) 58.6 % packet Take 2 packets by mouth daily.     . simethicone (MYLICON) 80 MG chewable tablet Chew 80 mg by  mouth every 6 (six) hours as needed for flatulence.     No facility-administered medications prior to visit.        Review of Systems  Constitutional: Negative for chills and fever.  Respiratory: Negative for chest tightness and shortness of breath.   Neurological: Negative for headaches.  Psychiatric/Behavioral: Negative for agitation, confusion, dysphoric mood, self-injury, sleep disturbance and suicidal ideas. The patient is not nervous/anxious and is not hyperactive.       Objective:    BP 136/80 (BP Location: Left Arm, Patient Position: Sitting, Cuff Size: Normal)   Pulse (!) 56   Temp 98.2 F (36.8 C) (Oral)   Resp 14   Ht 4\' 11"  (1.499 m)   Wt 118 lb (53.5 kg)   SpO2 95%   BMI 23.83  kg/m  Nursing note and vital signs reviewed.  Physical Exam  Constitutional: She is oriented to person, place, and time. She appears well-developed and well-nourished. No distress.  Cardiovascular: Normal rate, regular rhythm, normal heart sounds and intact distal pulses.   Pulmonary/Chest: Effort normal and breath sounds normal.  Neurological: She is alert and oriented to person, place, and time.  Skin: Skin is warm and dry.  Psychiatric: She has a normal mood and affect. Her behavior is normal. Judgment and thought content normal.       Assessment & Plan:   Problem List Items Addressed This Visit      Other   Depression - Primary    Depression appears stable with no adverse side effects or suicidal ideations. No psychotic features or hallucinations. Continue current dosage of Celexa. Continue to monitor.           I have discontinued Ms. Ranta's Docusate Calcium (STOOL SOFTENER PO), HYDROcodone-acetaminophen, psyllium, simethicone, predniSONE, and budesonide-formoterol. I am also having her maintain her ALIGN, Vitamin D, sodium chloride, glycerin adult, senna-docusate, aspirin, triamcinolone, VOLTAREN, Fish Oil-Cholecalciferol (FISH OIL + D3 PO), carbamide peroxide, albuterol, clindamycin, and citalopram.   Follow-up: Return in about 6 months (around 12/16/2017), or if symptoms worsen or fail to improve.  Mauricio Po, FNP

## 2017-06-16 NOTE — Patient Instructions (Addendum)
Thank you for choosing [facility].  SUMMARY AND INSTRUCTIONS:  Medication:  Please continue to take your medication as prescribed.   Follow up:  If your symptoms worsen or fail to improve, please contact our office for further instruction, or in case of emergency go directly to the emergency room at the closest medical facility.

## 2017-06-30 ENCOUNTER — Other Ambulatory Visit: Payer: Self-pay | Admitting: Internal Medicine

## 2017-07-07 ENCOUNTER — Other Ambulatory Visit: Payer: Self-pay | Admitting: Internal Medicine

## 2017-07-07 DIAGNOSIS — Z1231 Encounter for screening mammogram for malignant neoplasm of breast: Secondary | ICD-10-CM

## 2017-07-17 DIAGNOSIS — M7052 Other bursitis of knee, left knee: Secondary | ICD-10-CM | POA: Diagnosis not present

## 2017-07-17 DIAGNOSIS — G8929 Other chronic pain: Secondary | ICD-10-CM | POA: Diagnosis not present

## 2017-07-17 DIAGNOSIS — M25551 Pain in right hip: Secondary | ICD-10-CM | POA: Diagnosis not present

## 2017-07-17 DIAGNOSIS — M7061 Trochanteric bursitis, right hip: Secondary | ICD-10-CM | POA: Diagnosis not present

## 2017-07-20 ENCOUNTER — Other Ambulatory Visit: Payer: Self-pay

## 2017-07-31 DIAGNOSIS — M7061 Trochanteric bursitis, right hip: Secondary | ICD-10-CM | POA: Diagnosis not present

## 2017-08-03 DIAGNOSIS — K5900 Constipation, unspecified: Secondary | ICD-10-CM | POA: Diagnosis not present

## 2017-08-03 DIAGNOSIS — M4608 Spinal enthesopathy, sacral and sacrococcygeal region: Secondary | ICD-10-CM | POA: Diagnosis not present

## 2017-08-03 DIAGNOSIS — R1030 Lower abdominal pain, unspecified: Secondary | ICD-10-CM | POA: Diagnosis not present

## 2017-08-03 DIAGNOSIS — M461 Sacroiliitis, not elsewhere classified: Secondary | ICD-10-CM | POA: Diagnosis not present

## 2017-08-03 DIAGNOSIS — G894 Chronic pain syndrome: Secondary | ICD-10-CM | POA: Diagnosis not present

## 2017-08-03 DIAGNOSIS — Z79891 Long term (current) use of opiate analgesic: Secondary | ICD-10-CM | POA: Diagnosis not present

## 2017-08-03 NOTE — Progress Notes (Addendum)
Subjective:   [name] Barker is a 83 y.o. female who presents for Medicare Annual (Subsequent) preventive examination.  Patient and husband concerned about patient's poor STM   Review of Systems:  No ROS.  Medicare Wellness Visit. Additional risk factors are reflected in the social history.  Cardiac Risk Factors include: advanced age (>69men, >35 women);dyslipidemia Sleep patterns: feels rested on waking, does not get up to void, gets up 1 times nightly to void and sleeps 7-8 hours nightly.    Home Safety/Smoke Alarms: Feels safe in home. Smoke alarms in place.  Living environment; residence and Firearm Safety: 1-story house/ trailer, equipment: Radio producer, Type: Single [place] and Walkers, Type: Conservation officer, nature, no firearms. Seat Belt Safety/Bike Helmet: Wears seat belt.   Counseling:   Eye Exam- appointment every 6 months, Dr. Zigmund Daniel, [facility]- appointment yearly  Female:   Pap- N/A      Mammo- Last 08/06/16,  BI-RADS category 1: negative     Dexa scan- N/D        CCS- N/A     Objective:     Vitals: BP 104/60   Pulse (!) 57   Resp 20   Ht 4\' 11"  (1.499 m)   Wt 114 lb (51.7 kg)   SpO2 98%   BMI 23.03 kg/m   Body mass index is 23.03 kg/m.   Tobacco History  Smoking Status  . Never Smoker  Smokeless Tobacco  . Never Used     Counseling given: Not Answered   Past Medical History:  Diagnosis Date  . Allergic rhinitis   . ANEMIA-NOS 11/17/2006  . Colon cancer ([place]) 08/19/2007   1994 T2, 2008 T3, N1  . DIVERTICULOSIS-COLON 05/30/2010  . GERD 11/17/2006  . Hemorrhoids   . HYPERLIPIDEMIA 11/17/2006  . LOW BACK PAIN, CHRONIC 02/04/2010  . Osteopenia 11/17/2006  . Small bowel obstruction ([place])   . TIA (transient ischemic attack)    Past Surgical History:  Procedure Laterality Date  . ABDOMINAL HYSTERECTOMY    . APPENDECTOMY    . BACK SURGERY  2007  . BELPHAROPTOSIS REPAIR    . CATARACT EXTRACTION    . HEMICOLECTOMY  1994   Right  . JOINT  REPLACEMENT    . KNEE ARTHROSCOPY    . PARTIAL COLECTOMY  2008  . ROTATOR CUFF REPAIR    . SHOULDER SURGERY    . VAGINAL PROLAPSE REPAIR     Family History  Problem Relation Age of Onset  . Lung cancer Sister   . Colon cancer Sister 14  . Kidney cancer Sister    History  Sexual Activity  . Sexual activity: Not on file    Comment: not asked    Outpatient Encounter Prescriptions as of 08/04/2017  Medication Sig  . albuterol (PROAIR HFA) 108 (90 Base) MCG/ACT inhaler INHALE 2 PUFFS INTO THE LUNGS 2 (TWO) TIMES DAILY AS NEEDED. FOR WHEEZING  . aspirin 81 MG tablet Take 81 mg by mouth daily.   . carbamide peroxide (DEBROX) 6.5 % otic solution Place 5 drops into the right ear 2 (two) times daily.  . Cholecalciferol (VITAMIN D) 2000 UNITS tablet Take by mouth daily. 5,000 IU daily  . citalopram (CELEXA) 10 MG tablet Take 1 tablet (10 mg total) by mouth daily.  . clindamycin (CLEOCIN) 150 MG capsule TAKE 4 CAPSULES 1 HOUR BEFORE APPOINTMENT  . Fish Oil-Cholecalciferol (FISH OIL + D3 PO) Take by mouth.  Marland Kitchen glycerin adult (GLYCERIN ADULT) 2 G SUPP Place 1 suppository  rectally daily.   Marland Kitchen HYDROcodone-acetaminophen (NORCO/VICODIN) 5-325 MG tablet TAKE 1/2 TO 1 TABLET BY MOUTH EVERY 4 HOURS WHILE AWAKE AS NEEDED  . Probiotic Product (ALIGN) 4 MG CAPS Take 1 capsule by mouth daily.   Marland Kitchen senna-docusate (SENOKOT-S) 8.6-50 MG per tablet Take 1 tablet by mouth every evening.  . sodium chloride (MURO 128) 5 % ophthalmic ointment Place 1 drop into both eyes daily.  . VOLTAREN 1 % GEL APPLY TO AFFECTED AREA EVERY DAY AS NEEDED BACK PAIN  . [DISCONTINUED] triamcinolone (KENALOG) 0.025 % ointment Apply 1 application topically 2 (two) times daily.   No facility-administered encounter medications on file as of 08/04/2017.     Activities of Daily Living In your present state of health, do you have any difficulty performing the following activities: 08/04/2017  Hearing? Y  Vision? Y  Difficulty concentrating  or making decisions? Y  Walking or climbing stairs? Y  Dressing or bathing? N  Doing errands, shopping? Y  Preparing Food and eating ? Y  Using the Toilet? N  In the past six months, have you accidently leaked urine? N  Do you have problems with loss of bowel control? N  Managing your Medications? Y  Managing your Finances? Y  Housekeeping or managing your Housekeeping? Y  Some recent data might be hidden    Patient Care Team: Hoyt Koch, MD as PCP - General (Internal Medicine) Ladell Pier, MD as Consulting Physician (Oncology) Ladene Artist, MD as Consulting Physician (Gastroenterology)    Assessment:    Physical assessment deferred to PCP.  Exercise Activities and Dietary recommendations Current Exercise Habits: Home exercise routine, Type of exercise: stretching;walking, Time (Minutes): 30, Frequency (Times/Week): 5, Weekly Exercise (Minutes/Week): 150, Intensity: Mild, Exercise limited by: orthopedic condition(s)  Diet (meal preparation, eat out, water intake, caffeinated beverages, dairy products, fruits and vegetables): in general, a "healthy" diet  , eats a variety of fruits and vegetables daily, limits salt, fat/cholesterol, sugar, caffeine, drinks 2-3 glasses of water daily.  Discussed supplementing with ensure or boost, encouraged patient to increase daily water intake.  Goals    . Enjoy family, play scrabble, be as active and as independent  as possible      Fall Risk Fall Risk  08/04/2017 11/02/2015 09/26/2013 09/26/2013  Falls in the past year? Yes No No Yes  Number falls in past yr: 2 or more - - 1  Injury with Fall? No - - Yes  Risk for fall due to : Impaired mobility;Impaired balance/gait - - Impaired balance/gait  Follow up Falls prevention discussed - - -   Depression Screen PHQ 2/9 Scores 08/04/2017 11/02/2015 09/26/2013 09/26/2013  PHQ - 2 Score 1 0 0 0  PHQ- 9 Score 2 - - -     Cognitive Function MMSE - Mini Mental State Exam 08/04/2017    Orientation to time 2  Orientation to Place 3  Registration 3  Attention/ Calculation 5  Recall 0  Language- name 2 objects 2  Language- repeat 1  Language- follow 3 step command 3  Language- read & follow direction 1  Write a sentence 1  Copy design 1  Total score 22        Immunization History  Administered Date(s) Administered  . Influenza Split 09/29/2011  . Influenza Whole 10/03/2008, 09/28/2012  . Influenza,inj,Quad PF,36+ Mos 09/12/2013  . Influenza,trivalent, recombinat, inj, PF 09/26/2013  . Influenza-Unspecified 09/28/2014, 10/13/2015  . Pneumococcal Conjugate-13 06/07/2015  . Pneumococcal Polysaccharide-23 10/29/2006  . Td  12/29/1994  . Tdap 07/18/2011  . Zoster 08/25/2011   Screening Tests Health Maintenance  Topic Date Due  . DEXA SCAN  05/14/1988  . INFLUENZA VACCINE  07/29/2017  . TETANUS/TDAP  07/17/2021  . PNA vac Low Risk Adult  Completed      Plan:     Scheduled appointment with provider 08/10/17 to follow-up regarding memory and forgetfulness concerns.  Continue doing brain stimulating activities (puzzles, reading, adult coloring books, staying active) to keep memory sharp.   Continue to eat heart healthy diet (full of fruits, vegetables, whole grains, lean protein, water--limit salt, fat, and sugar intake) and increase physical activity as tolerated.  I have personally reviewed and noted the following in the patient's chart:   . Medical and social history . Use of alcohol, tobacco or illicit drugs  . Current medications and supplements . Functional ability and status . Nutritional status . Physical activity . Advanced directives . List of other physicians . Vitals . Screenings to include cognitive, depression, and falls . Referrals and appointments  In addition, I have reviewed and discussed with patient certain preventive protocols, quality metrics, and best practice recommendations. A written personalized care plan for preventive  services as well as general preventive health recommendations were provided to patient.     Michiel Cowboy, RN  08/04/2017    Medical screening examination/treatment/procedure(s) were performed by non-physician practitioner and as supervising provider I was immediately available for consultation/collaboration. I agree with treatment plan. Mauricio Po, FNP

## 2017-08-03 NOTE — Progress Notes (Signed)
Pre visit review using our clinic review tool, if applicable. No additional management support is needed unless otherwise documented below in the visit note. 

## 2017-08-04 ENCOUNTER — Ambulatory Visit (INDEPENDENT_AMBULATORY_CARE_PROVIDER_SITE_OTHER): Payer: Medicare Other | Admitting: *Deleted

## 2017-08-04 VITALS — BP 104/60 | HR 57 | Resp 20 | Ht 59.0 in | Wt 114.0 lb

## 2017-08-04 DIAGNOSIS — Z0000 Encounter for general adult medical examination without abnormal findings: Secondary | ICD-10-CM

## 2017-08-04 NOTE — Patient Instructions (Addendum)
Continue doing brain stimulating activities (puzzles, reading, adult coloring books, staying active) to keep memory sharp.   Continue to eat heart healthy diet (full of fruits, vegetables, whole grains, lean protein, water--limit salt, fat, and sugar intake) and increase physical activity as tolerated.   [name] , Thank you for taking time to come for your Medicare Wellness Visit. I appreciate your ongoing commitment to your health goals. Please review the following plan we discussed and let me know if I can assist you in the future.   These are the goals we discussed: Goals    . Enjoy family, play scrabble, be as active and as independent  as possible       This is a list of the screening recommended for you and due dates:  Health Maintenance  Topic Date Due  . DEXA scan (bone density measurement)  05/14/1988  . Flu Shot  07/29/2017  . Tetanus Vaccine  07/17/2021  . Pneumonia vaccines  Completed    High-Fiber Diet Fiber, also called dietary fiber, is a type of carbohydrate found in fruits, vegetables, whole grains, and beans. A high-fiber diet can have many health benefits. Your health care provider may recommend a high-fiber diet to help:  Prevent constipation. Fiber can make your bowel movements more regular.  Lower your cholesterol.  Relieve hemorrhoids, uncomplicated diverticulosis, or irritable bowel syndrome.  Prevent overeating as part of a weight-loss plan.  Prevent heart disease, type 2 diabetes, and certain cancers.  What is my plan? The recommended daily intake of fiber includes:  38 grams for men under age 82.  31 grams for men over age 26.  89 grams for women under age 37.  55 grams for women over age 54.  You can get the recommended daily intake of dietary fiber by eating a variety of fruits, vegetables, grains, and beans. Your health care provider may also recommend a fiber supplement if it is not possible to get enough fiber through your  diet. What do I need to know about a high-fiber diet?  Fiber supplements have not been widely studied for their effectiveness, so it is better to get fiber through food sources.  Always check the fiber content on thenutrition facts label of any prepackaged food. Look for foods that contain at least 5 grams of fiber per serving.  Ask your dietitian if you have questions about specific foods that are related to your condition, especially if those foods are not listed in the following section.  Increase your daily fiber consumption gradually. Increasing your intake of dietary fiber too quickly may cause bloating, cramping, or gas.  Drink plenty of water. Water helps you to digest fiber. What foods can I eat? Grains Whole-grain breads. Multigrain cereal. Oats and oatmeal. Brown rice. Barley. Bulgur wheat. [place]. Bran muffins. Popcorn. Rye wafer crackers. Vegetables Sweet potatoes. Spinach. Kale. Artichokes. Cabbage. Broccoli. Green peas. Carrots. Squash. Fruits Berries. Pears. Apples. Oranges. Avocados. Prunes and raisins. Dried figs. Meats and Other Protein Sources [place], kidney, pinto, and soy beans. Split peas. Lentils. Nuts and seeds. Dairy Fiber-fortified yogurt. Beverages Fiber-fortified soy milk. Fiber-fortified orange juice. Other Fiber bars. The items listed above may not be a complete list of recommended foods or beverages. Contact your dietitian for more options. What foods are not recommended? Grains White bread. Pasta made with refined flour. White rice. Vegetables Fried potatoes. Canned vegetables. Well-cooked vegetables. Fruits Fruit juice. Cooked, strained fruit. Meats and Other Protein Sources Fatty cuts of meat. Fried Sales executive or fried fish.  Dairy Milk. Yogurt. Cream cheese. Sour cream. Beverages Soft drinks. Other Cakes and pastries. Butter and oils. The items listed above may not be a complete list of foods and beverages to avoid. Contact your dietitian for  more information. What are some tips for including high-fiber foods in my diet?  Eat a wide variety of high-fiber foods.  Make sure that half of all grains consumed each day are whole grains.  Replace breads and cereals made from refined flour or white flour with whole-grain breads and cereals.  Replace white rice with brown rice, bulgur wheat, or millet.  Start the day with a breakfast that is high in fiber, such as a cereal that contains at least 5 grams of fiber per serving.  Use beans in place of meat in soups, salads, or pasta.  Eat high-fiber snacks, such as berries, raw vegetables, nuts, or popcorn. This information is not intended to replace advice given to you by your health care provider. Make sure you discuss any questions you have with your health care provider. Document Released: 12/15/2005 Document Revised: 05/22/2016 Document Reviewed: 05/30/2014 Elsevier Interactive Patient Education  2017 [employer].

## 2017-08-05 DIAGNOSIS — M7061 Trochanteric bursitis, right hip: Secondary | ICD-10-CM | POA: Diagnosis not present

## 2017-08-07 ENCOUNTER — Ambulatory Visit
Admission: RE | Admit: 2017-08-07 | Discharge: 2017-08-07 | Disposition: A | Discharge status: Home / Self Care | Payer: Medicare Other | Source: Ambulatory Visit | Attending: Internal Medicine | Admitting: Internal Medicine

## 2017-08-07 DIAGNOSIS — Z1231 Encounter for screening mammogram for malignant neoplasm of breast: Secondary | ICD-10-CM | POA: Diagnosis not present

## 2017-08-10 ENCOUNTER — Ambulatory Visit (INDEPENDENT_AMBULATORY_CARE_PROVIDER_SITE_OTHER): Payer: Medicare Other | Admitting: Family

## 2017-08-10 ENCOUNTER — Encounter: Payer: Self-pay | Admitting: Family

## 2017-08-10 VITALS — BP 134/80 | HR 54 | Temp 98.0°F | Resp 16 | Ht 59.0 in | Wt 115.0 lb

## 2017-08-10 DIAGNOSIS — F0390 Unspecified dementia without behavioral disturbance: Secondary | ICD-10-CM | POA: Diagnosis not present

## 2017-08-10 DIAGNOSIS — F03A0 Unspecified dementia, mild, without behavioral disturbance, psychotic disturbance, mood disturbance, and anxiety: Secondary | ICD-10-CM

## 2017-08-10 NOTE — Assessment & Plan Note (Signed)
MMSE score stable at 22 indicating mild dementia. No indication for medication at this time. Encouraged to continue with keeping structure and mind games and puzzles to stay sharp. Referral placed to neuropsychology and neurology. Continue to monitor.

## 2017-08-10 NOTE — Progress Notes (Signed)
Subjective:    Patient ID: [name] Barker, female    DOB: Aug 13, 1921, 83 y.o.   MRN: [id number]  Chief Complaint  Patient presents with  . Memory Loss    husband is asking about something for memory loss for pt    HPI:  [name] Barker is a 83 y.o. female who  has a past medical history of Allergic rhinitis; ANEMIA-NOS (11/17/2006); Colon cancer ([place]) (08/19/2007); DIVERTICULOSIS-COLON (05/30/2010); GERD (11/17/2006); Hemorrhoids; HYPERLIPIDEMIA (11/17/2006); LOW BACK PAIN, CHRONIC (02/04/2010); Osteopenia (11/17/2006); Small bowel obstruction ([place]); and TIA (transient ischemic attack). and presents today for an office visit. She is present with her husband who provides some of the history.   This is a new problem. Associated symptom of decreased short term memory including forgetting names and names of common things that has been going on for about a year or more. She has good long term memory. In a new situation she has some confusion and disorientation. Course of the symptoms are generally worsening since it was initially noted. She did have previous head trauma about 1 year ago. She completed a MMSE with a score of 22/30.   Allergies  Allergen Reactions  . Flexeril [Cyclobenzaprine]     nightmares  . Methenamine Mandelate     REACTION: burning sensation  . Morphine Sulfate     REACTION: blister  . Penicillins     REACTION: urticaria (hives)  . Promethazine Hcl     REACTION: hallucinations  . Statins Other (See Comments)    Muscle pain       Outpatient Medications Prior to Visit  Medication Sig Dispense Refill  . albuterol (PROAIR HFA) 108 (90 Base) MCG/ACT inhaler INHALE 2 PUFFS INTO THE LUNGS 2 (TWO) TIMES DAILY AS NEEDED. FOR WHEEZING 8.5 Inhaler 4  . aspirin 81 MG tablet Take 81 mg by mouth daily.     . carbamide peroxide (DEBROX) 6.5 % otic solution Place 5 drops into the right ear 2 (two) times daily. 15 mL 0  . Cholecalciferol (VITAMIN D) 2000 UNITS tablet Take  by mouth daily. 5,000 IU daily    . citalopram (CELEXA) 10 MG tablet Take 1 tablet (10 mg total) by mouth daily. 90 tablet 0  . clindamycin (CLEOCIN) 150 MG capsule TAKE 4 CAPSULES 1 HOUR BEFORE APPOINTMENT 12 capsule 0  . Fish Oil-Cholecalciferol (FISH OIL + D3 PO) Take by mouth.    Marland Kitchen glycerin adult (GLYCERIN ADULT) 2 G SUPP Place 1 suppository rectally daily.     Marland Kitchen HYDROcodone-acetaminophen (NORCO/VICODIN) 5-325 MG tablet TAKE 1/2 TO 1 TABLET BY MOUTH EVERY 4 HOURS WHILE AWAKE AS NEEDED  0  . Probiotic Product (ALIGN) 4 MG CAPS Take 1 capsule by mouth daily.     Marland Kitchen senna-docusate (SENOKOT-S) 8.6-50 MG per tablet Take 1 tablet by mouth every evening.    . sodium chloride (MURO 128) 5 % ophthalmic ointment Place 1 drop into both eyes daily.    . VOLTAREN 1 % GEL APPLY TO AFFECTED AREA EVERY DAY AS NEEDED BACK PAIN 400 g 5   No facility-administered medications prior to visit.       Past Surgical History:  Procedure Laterality Date  . ABDOMINAL HYSTERECTOMY    . APPENDECTOMY    . BACK SURGERY  2007  . BELPHAROPTOSIS REPAIR    . CATARACT EXTRACTION    . HEMICOLECTOMY  1994   Right  . JOINT REPLACEMENT    . KNEE ARTHROSCOPY    . PARTIAL COLECTOMY  2008  . ROTATOR CUFF REPAIR    . SHOULDER SURGERY    . VAGINAL PROLAPSE REPAIR        Past Medical History:  Diagnosis Date  . Allergic rhinitis   . ANEMIA-NOS 11/17/2006  . Colon cancer ([place]) 08/19/2007   1994 T2, 2008 T3, N1  . DIVERTICULOSIS-COLON 05/30/2010  . GERD 11/17/2006  . Hemorrhoids   . HYPERLIPIDEMIA 11/17/2006  . LOW BACK PAIN, CHRONIC 02/04/2010  . Osteopenia 11/17/2006  . Small bowel obstruction ([place])   . TIA (transient ischemic attack)       Review of Systems  Constitutional: Negative for chills and fever.  Respiratory: Negative for chest tightness and shortness of breath.   Cardiovascular: Negative for chest pain, palpitations and leg swelling.  Neurological: Negative for dizziness, tremors, seizures,  syncope, facial asymmetry, speech difficulty, weakness, light-headedness, numbness and headaches.       Positive for memory problems.       Objective:    BP 134/80 (BP Location: Left Arm, Patient Position: Sitting, Cuff Size: Normal)   Pulse (!) 54   Temp 98 F (36.7 C) (Oral)   Resp 16   Ht 4\' 11"  (1.499 m)   Wt 115 lb (52.2 kg)   SpO2 93%   BMI 23.23 kg/m  Nursing note and vital signs reviewed.  Physical Exam  Constitutional: She is oriented to person, place, and time. She appears well-developed and well-nourished. No distress.  Cardiovascular: Normal rate, regular rhythm, normal heart sounds and intact distal pulses.   Pulmonary/Chest: Effort normal and breath sounds normal.  Neurological: She is alert and oriented to person, place, and time.  MMSE score of 22. Alert and confused at times. Able to follow commands with adequate muscle strength. Does have issues with short term memories and remembering/identifying names and objects.   Skin: Skin is warm and dry.  Psychiatric: She has a normal mood and affect. Her behavior is normal. Judgment and thought content normal.       Assessment & Plan:   Problem List Items Addressed This Visit      Nervous and Auditory   Mild dementia - Primary    MMSE score stable at 22 indicating mild dementia. No indication for medication at this time. Encouraged to continue with keeping structure and mind games and puzzles to stay sharp. Referral placed to neuropsychology and neurology. Continue to monitor.       Relevant Orders   Ambulatory referral to Neuropsychology       I am having Ms. Voisin maintain her ALIGN, Vitamin D, sodium chloride, glycerin adult, senna-docusate, aspirin, VOLTAREN, Fish Oil-Cholecalciferol (FISH OIL + D3 PO), carbamide peroxide, albuterol, clindamycin, citalopram, and HYDROcodone-acetaminophen.   Follow-up: Return in about 3 months (around 11/10/2017), or if symptoms worsen or fail to improve.  Mauricio Po, FNP

## 2017-08-10 NOTE — Patient Instructions (Addendum)
Thank you for choosing [facility].  SUMMARY AND INSTRUCTIONS:  Please continue to take your medication as prescribed.  They will call to schedule your appointment with neurology and neuropsychology.  Continue to take the Vicodin as prescribed. May also add 500-650 mg up to 3 times per day in addition to the Vicodin.  Continue brain games and being active.   Follow up:  If your symptoms worsen or fail to improve, please contact our office for further instruction, or in case of emergency go directly to the emergency room at the closest medical facility.    Management of Memory Problems  There are some general things you can do to help manage your memory problems.  Your memory may not in fact recover, but by using techniques and strategies you will be able to manage your memory difficulties better.  1)  Establish a routine.  Try to establish and then stick to a regular routine.  By doing this, you will get used to what to expect and you will reduce the need to rely on your memory.  Also, try to do things at the same time of day, such as taking your medication or checking your calendar first thing in the morning.  Think about think that you can do as a part of a regular routine and make a list.  Then enter them into a daily planner to remind you.  This will help you establish a routine.  2)  Organize your environment.  Organize your environment so that it is uncluttered.  Decrease visual stimulation.  Place everyday items such as keys or cell phone in the same place every day (ie.  Basket next to front door)  Use post it notes with a brief message to yourself (ie. Turn off light, lock the door)  Use labels to indicate where things go (ie. Which cupboards are for food, dishes, etc.)  Keep a notepad and pen by the telephone to take messages  3)  Memory Aids  A diary or journal/notebook/daily planner  Making a list (shopping list, chore list, to do list that needs to be  done)  Using an alarm as a reminder (kitchen timer or cell phone alarm)  Using cell phone to store information (Notes, Calendar, Reminders)  Calendar/White board placed in a prominent position  Post-it notes  In order for memory aids to be useful, you need to have good habits.  It's no good remembering to make a note in your journal if you don't remember to look in it.  Try setting aside a certain time of day to look in journal.  4)  Improving mood and managing fatigue.  There may be other factors that contribute to memory difficulties.  Factors, such as anxiety, depression and tiredness can affect memory.  Regular gentle exercise can help improve your mood and give you more energy.  Simple relaxation techniques may help relieve symptoms of anxiety  Try to get back to completing activities or hobbies you enjoyed doing in the past.  Learn to pace yourself through activities to decrease fatigue.  Find out about some local support groups where you can share experiences with others.  Try and achieve 7-8 hours of sleep at night.

## 2017-08-11 ENCOUNTER — Encounter: Payer: Self-pay | Admitting: Neurology

## 2017-08-11 DIAGNOSIS — M7061 Trochanteric bursitis, right hip: Secondary | ICD-10-CM | POA: Diagnosis not present

## 2017-08-13 DIAGNOSIS — M7061 Trochanteric bursitis, right hip: Secondary | ICD-10-CM | POA: Diagnosis not present

## 2017-08-17 DIAGNOSIS — M7061 Trochanteric bursitis, right hip: Secondary | ICD-10-CM | POA: Diagnosis not present

## 2017-08-20 DIAGNOSIS — M7061 Trochanteric bursitis, right hip: Secondary | ICD-10-CM | POA: Diagnosis not present

## 2017-08-25 DIAGNOSIS — M7061 Trochanteric bursitis, right hip: Secondary | ICD-10-CM | POA: Diagnosis not present

## 2017-08-27 DIAGNOSIS — M7052 Other bursitis of knee, left knee: Secondary | ICD-10-CM | POA: Diagnosis not present

## 2017-08-28 DIAGNOSIS — M7061 Trochanteric bursitis, right hip: Secondary | ICD-10-CM | POA: Diagnosis not present

## 2017-09-03 DIAGNOSIS — L918 Other hypertrophic disorders of the skin: Secondary | ICD-10-CM | POA: Diagnosis not present

## 2017-09-03 DIAGNOSIS — L821 Other seborrheic keratosis: Secondary | ICD-10-CM | POA: Diagnosis not present

## 2017-09-03 DIAGNOSIS — D171 Benign lipomatous neoplasm of skin and subcutaneous tissue of trunk: Secondary | ICD-10-CM | POA: Diagnosis not present

## 2017-09-03 DIAGNOSIS — L820 Inflamed seborrheic keratosis: Secondary | ICD-10-CM | POA: Diagnosis not present

## 2017-09-03 DIAGNOSIS — R202 Paresthesia of skin: Secondary | ICD-10-CM | POA: Diagnosis not present

## 2017-09-07 DIAGNOSIS — M7061 Trochanteric bursitis, right hip: Secondary | ICD-10-CM | POA: Diagnosis not present

## 2017-09-09 DIAGNOSIS — M7061 Trochanteric bursitis, right hip: Secondary | ICD-10-CM | POA: Diagnosis not present

## 2017-09-21 ENCOUNTER — Encounter: Payer: Self-pay | Admitting: Family

## 2017-09-21 ENCOUNTER — Ambulatory Visit (INDEPENDENT_AMBULATORY_CARE_PROVIDER_SITE_OTHER): Payer: Medicare Other | Admitting: Family

## 2017-09-21 VITALS — BP 116/68 | HR 64 | Temp 98.4°F | Resp 16 | Ht 59.0 in | Wt 115.0 lb

## 2017-09-21 DIAGNOSIS — J40 Bronchitis, not specified as acute or chronic: Secondary | ICD-10-CM | POA: Diagnosis not present

## 2017-09-21 DIAGNOSIS — I499 Cardiac arrhythmia, unspecified: Secondary | ICD-10-CM | POA: Diagnosis not present

## 2017-09-21 MED ORDER — AZITHROMYCIN 250 MG PO TABS: ORAL_TABLET | ORAL | 0 refills | Status: DC

## 2017-09-21 MED ORDER — PREDNISONE 5 MG (21) PO TBPK: ORAL_TABLET | ORAL | 0 refills | Status: DC

## 2017-09-21 NOTE — Assessment & Plan Note (Signed)
New onset irregular heart rate with concern for atrial fibrillation and physical exam. In office EKG with no significant changes from previous tracings indicating sinus bradycardia with prominent RV 1 consistent with pulmonary disease. No current cardiac symptoms. Continue to monitor and follow-up if symptoms develop or become more irregular.

## 2017-09-21 NOTE — Progress Notes (Signed)
Subjective:    Patient ID: [name] Barker, female    DOB: Dec 24, 1921, 83 y.o.   MRN: [id number]  Chief Complaint  Patient presents with  . Cough    x2 days has had a bad cough, did mention that she coughed up some green mucus this morning, no sore throat but does hurt deep in her chest when she cough    HPI:  [name] Barker is a 83 y.o. female who  has a past medical history of Allergic rhinitis; ANEMIA-NOS (11/17/2006); Colon cancer ([place]) (08/19/2007); DIVERTICULOSIS-COLON (05/30/2010); GERD (11/17/2006); Hemorrhoids; HYPERLIPIDEMIA (11/17/2006); LOW BACK PAIN, CHRONIC (02/04/2010); Osteopenia (11/17/2006); Small bowel obstruction ([place]); and TIA (transient ischemic attack). and presents today for an acute office visit.  This is a new problem. Associated symptom of a cough has been going on for about 3 days. Cough was productive this morning and noted to cough up some green mucus. Severity of the cough is enough to hurt her chest when she does cough. No fevers. Modifying factors include albuterol which has helped a little. Timing of the symptoms is worse at night. Recently took in some dogs and was concerned it was allergy related.  Allergies  Allergen Reactions  . Flexeril [Cyclobenzaprine]     nightmares  . Methenamine Mandelate     REACTION: burning sensation  . Morphine Sulfate     REACTION: blister  . Penicillins     REACTION: urticaria (hives)  . Promethazine Hcl     REACTION: hallucinations  . Statins Other (See Comments)    Muscle pain       Outpatient Medications Prior to Visit  Medication Sig Dispense Refill  . albuterol (PROAIR HFA) 108 (90 Base) MCG/ACT inhaler INHALE 2 PUFFS INTO THE LUNGS 2 (TWO) TIMES DAILY AS NEEDED. FOR WHEEZING 8.5 Inhaler 4  . aspirin 81 MG tablet Take 81 mg by mouth daily.     . carbamide peroxide (DEBROX) 6.5 % otic solution Place 5 drops into the right ear 2 (two) times daily. 15 mL 0  . Cholecalciferol (VITAMIN D) 2000 UNITS tablet  Take by mouth daily. 5,000 IU daily    . citalopram (CELEXA) 10 MG tablet Take 1 tablet (10 mg total) by mouth daily. 90 tablet 0  . clindamycin (CLEOCIN) 150 MG capsule TAKE 4 CAPSULES 1 HOUR BEFORE APPOINTMENT 12 capsule 0  . Fish Oil-Cholecalciferol (FISH OIL + D3 PO) Take by mouth.    Marland Kitchen glycerin adult (GLYCERIN ADULT) 2 G SUPP Place 1 suppository rectally daily.     Marland Kitchen HYDROcodone-acetaminophen (NORCO/VICODIN) 5-325 MG tablet TAKE 1/2 TO 1 TABLET BY MOUTH EVERY 4 HOURS WHILE AWAKE AS NEEDED  0  . Probiotic Product (ALIGN) 4 MG CAPS Take 1 capsule by mouth daily.     Marland Kitchen senna-docusate (SENOKOT-S) 8.6-50 MG per tablet Take 1 tablet by mouth every evening.    . sodium chloride (MURO 128) 5 % ophthalmic ointment Place 1 drop into both eyes daily.    . VOLTAREN 1 % GEL APPLY TO AFFECTED AREA EVERY DAY AS NEEDED BACK PAIN 400 g 5   No facility-administered medications prior to visit.     Past Medical History:  Diagnosis Date  . Allergic rhinitis   . ANEMIA-NOS 11/17/2006  . Colon cancer ([place]) 08/19/2007   1994 T2, 2008 T3, N1  . DIVERTICULOSIS-COLON 05/30/2010  . GERD 11/17/2006  . Hemorrhoids   . HYPERLIPIDEMIA 11/17/2006  . LOW BACK PAIN, CHRONIC 02/04/2010  . Osteopenia 11/17/2006  . Small  bowel obstruction ([employer])   . TIA (transient ischemic attack)       Review of Systems  Constitutional: Negative for chills and fever.  HENT: Positive for congestion and sore throat.   Respiratory: Positive for cough and wheezing. Negative for chest tightness and shortness of breath.       Objective:    BP 116/68 (BP Location: Left Arm, Patient Position: Sitting, Cuff Size: Normal)   Pulse 64   Temp 98.4 F (36.9 C) (Oral)   Resp 16   Ht 4\' 11"  (1.499 m)   Wt 115 lb (52.2 kg)   SpO2 97%   BMI 23.23 kg/m  Nursing note and vital signs reviewed.  Physical Exam  Constitutional: She is oriented to person, place, and time. She appears well-developed and well-nourished. No distress.    Cardiovascular: Normal rate, normal heart sounds and intact distal pulses.  An irregular rhythm present. Exam reveals no gallop and no friction rub.   No murmur heard. Pulmonary/Chest: Effort normal. No respiratory distress. She has wheezes. She has rales. She exhibits no tenderness.  Neurological: She is alert and oriented to person, place, and time.  Skin: Skin is warm and dry.  Psychiatric: She has a normal mood and affect. Her behavior is normal. Judgment and thought content normal.       Assessment & Plan:   Problem List Items Addressed This Visit      Respiratory   Bronchitis - Primary    No onset cough with symptoms consistent with bronchitis. Start azithromycin. Continue over-the-counter medications as needed for symptom relief and supportive care. Follow-up if symptoms worsen or do not improve.        Other   Irregular heartbeat    New onset irregular heart rate with concern for atrial fibrillation and physical exam. In office EKG with no significant changes from previous tracings indicating sinus bradycardia with prominent RV 1 consistent with pulmonary disease. No current cardiac symptoms. Continue to monitor and follow-up if symptoms develop or become more irregular.      Relevant Orders   EKG 12-Lead (Completed)       I am having Ms. Greaser start on azithromycin and predniSONE. I am also having her maintain her ALIGN, Vitamin D, sodium chloride, glycerin adult, senna-docusate, aspirin, VOLTAREN, Fish Oil-Cholecalciferol (FISH OIL + D3 PO), carbamide peroxide, albuterol, clindamycin, citalopram, and HYDROcodone-acetaminophen.   Meds ordered this encounter  Medications  . azithromycin (ZITHROMAX) 250 MG tablet    Sig: Take 2 tablets by mouth for 1 day and then 1 tablet daily for 4 days.    Dispense:  6 tablet    Refill:  0    Order Specific Question:   Supervising Provider    Answer:   Pricilla Holm A [[id number]]  . predniSONE (STERAPRED UNI-PAK 21 TAB) 5 MG  (21) TBPK tablet    Sig: Take 6 tablets x 1 day, 5 tablets x 1 day, 4 tablets x 1 day, 3 tablets x 1 day, 2 tablets x 1 day, 1 tablet x 1 day    Dispense:  21 tablet    Refill:  0    Order Specific Question:   Supervising Provider    Answer:   Pricilla Holm A [[id number]]     Follow-up: Return if symptoms worsen or fail to improve.  Mauricio Po, FNP

## 2017-09-21 NOTE — Assessment & Plan Note (Signed)
No onset cough with symptoms consistent with bronchitis. Start azithromycin. Continue over-the-counter medications as needed for symptom relief and supportive care. Follow-up if symptoms worsen or do not improve.

## 2017-09-21 NOTE — Patient Instructions (Addendum)
Thank you for choosing [facility].  SUMMARY AND INSTRUCTIONS:  Continue to take your medications as prescribed.  Please start taking the azithromycin and prednisone.   Continue over the counter meds as needed.  Medication:  Your prescription(s) have been submitted to your pharmacy or been printed and provided for you. Please take as directed and contact our office if you believe you are having problem(s) with the medication(s) or have any questions.  Follow up:  If your symptoms worsen or fail to improve, please contact our office for further instruction, or in case of emergency go directly to the emergency room at the closest medical facility.   General Recommendations:    Please drink plenty of fluids.  Get plenty of rest   Sleep in humidified air  Use saline nasal sprays  Netti pot   OTC Medications:  Decongestants - helps relieve congestion   Flonase (generic fluticasone) or Nasacort (generic triamcinolone) - please make sure to use the "cross-over" technique at a 45 degree angle towards the opposite eye as opposed to straight up the nasal passageway.   Sudafed (generic pseudoephedrine - Note this is the one that is available behind the pharmacy counter); Products with phenylephrine (-PE) may also be used but is often not as effective as pseudoephedrine.   If you have HIGH BLOOD PRESSURE - Coricidin HBP; AVOID any product that is -D as this contains pseudoephedrine which may increase your blood pressure.  Afrin (oxymetazoline) every 6-8 hours for up to 3 days.   Allergies - helps relieve runny nose, itchy eyes and sneezing   Claritin (generic loratidine), Allegra (fexofenidine), or Zyrtec (generic cyrterizine) for runny nose. These medications should not cause drowsiness.  Note - Benadryl (generic diphenhydramine) may be used however may cause drowsiness  Cough -   Delsym or Robitussin (generic dextromethorphan)  Expectorants - helps loosen mucus to  ease removal   Mucinex (generic guaifenesin) as directed on the package.  Headaches / General Aches   Tylenol (generic acetaminophen) - DO NOT EXCEED 3 grams (3,000 mg) in a 24 hour time period  Advil/Motrin (generic ibuprofen)   Sore Throat -   Salt water gargle   Chloraseptic (generic benzocaine) spray or lozenges / Sucrets (generic dyclonine)

## 2017-09-28 ENCOUNTER — Other Ambulatory Visit: Payer: Self-pay | Admitting: Family

## 2017-10-13 ENCOUNTER — Encounter: Payer: Self-pay | Admitting: Neurology

## 2017-10-13 ENCOUNTER — Ambulatory Visit (INDEPENDENT_AMBULATORY_CARE_PROVIDER_SITE_OTHER): Payer: Medicare Other | Admitting: Neurology

## 2017-10-13 VITALS — BP 122/64 | HR 58 | Ht 59.0 in | Wt 117.0 lb

## 2017-10-13 DIAGNOSIS — F03A0 Unspecified dementia, mild, without behavioral disturbance, psychotic disturbance, mood disturbance, and anxiety: Secondary | ICD-10-CM

## 2017-10-13 DIAGNOSIS — F0390 Unspecified dementia without behavioral disturbance: Secondary | ICD-10-CM

## 2017-10-13 NOTE — Progress Notes (Signed)
NEUROLOGY CONSULTATION NOTE  [name] Barker MRN: [id number] DOB: [birth year]  Referring provider: Mauricio Po, FNP Primary care provider: Mauricio Po, FNP  Reason for consult:  Mild dementia   Thank you for your kind referral of [name] Barker for consultation of the above symptoms. Although her history is well known to you, please allow me to reiterate it for the purpose of our medical record. The patient was accompanied to the clinic by her husband who also provides collateral information. Records and images were personally reviewed where available.  HISTORY OF PRESENT ILLNESS: This is a pleasant 83 year old right-handed woman with a history of hyperlipidemia, colon cancer, presenting for evaluation of worsening memory and word-finding difficulties. She feels her memory is pretty good. Her husband reports her long-term memory is good, but over the past year, she has been having more word-finding difficulties. She would point for a box of Kleenex and could not recall the name. She remembers it later on. She forgets her grandchildren's names, but remembers later. She states her age is 83, that the year is [birth year]. Her husband is in charge of bills and medications. She stopped driving 2 years ago because she felt she should not be driving. Her husband denies misplacing things frequently, she does not cook. No personality changes. She feels citalopram 10mg  daily helps her a lot. She sometimes hears things, asking him who he was talking to when he was not speaking to anyone. She sleeps well, does not wander at night. They like to do puzzles. She is limited in going out due to back pain and knee issues. She is spending a lot of time in bed. She is able to dress and bathe herself. Her husband was concerned about the possibility of mini-strokes, she had an episode around 5 years ago when she had sudden onset inability to remember words. She was brought to the hospital with normal tests, put  on full dose aspirin which caused epistaxis. She continues on aspirin 81mg  daily without side effects. Her husband recalls she tried Aricept in the past but thinks it gave her bad dreams. MMSE in PCP office last August 2018 was 22/30.  She reports dizziness when getting out of bed occurring 2-3 times a week. She has had some falls but denies any significant injuries except for a fall with loss of consciousness after gabapentin was increased in 2014, I personally reviewed head CT without contrast done then which showed mild age-related atrophy, no acute changes. She denies any headaches, diplopia, dysarthria/dysphagia, neck pain, focal numbness/tingling/weakness. She has chronic back pain and deals with constipation. No anosmia or tremors. No family history of dementia. She denies any alcohol use.   Laboratory Data: Lab Results  Component Value Date   TSH 3.00 03/19/2016   Lab Results  Component Value Date   [id number] 938 11/02/2015      PAST MEDICAL HISTORY: Past Medical History:  Diagnosis Date  . Allergic rhinitis   . ANEMIA-NOS 11/17/2006  . Colon cancer ([place]) 08/19/2007   1994 T2, 2008 T3, N1  . DIVERTICULOSIS-COLON 05/30/2010  . GERD 11/17/2006  . Hemorrhoids   . HYPERLIPIDEMIA 11/17/2006  . LOW BACK PAIN, CHRONIC 02/04/2010  . Osteopenia 11/17/2006  . Small bowel obstruction ([place])   . TIA (transient ischemic attack)     PAST SURGICAL HISTORY: Past Surgical History:  Procedure Laterality Date  . ABDOMINAL HYSTERECTOMY    . APPENDECTOMY    . BACK SURGERY  2007  . BELPHAROPTOSIS REPAIR    .  CATARACT EXTRACTION    . HEMICOLECTOMY  1994   Right  . JOINT REPLACEMENT    . KNEE ARTHROSCOPY    . PARTIAL COLECTOMY  2008  . ROTATOR CUFF REPAIR    . SHOULDER SURGERY    . VAGINAL PROLAPSE REPAIR      MEDICATIONS: Current Outpatient Prescriptions on File Prior to Visit  Medication Sig Dispense Refill  . albuterol (PROAIR HFA) 108 (90 Base) MCG/ACT inhaler INHALE 2 PUFFS INTO  THE LUNGS 2 (TWO) TIMES DAILY AS NEEDED. FOR WHEEZING 8.5 Inhaler 4  . aspirin 81 MG tablet Take 81 mg by mouth daily.     Marland Kitchen azithromycin (ZITHROMAX) 250 MG tablet Take 2 tablets by mouth for 1 day and then 1 tablet daily for 4 days. 6 tablet 0  . carbamide peroxide (DEBROX) 6.5 % otic solution Place 5 drops into the right ear 2 (two) times daily. 15 mL 0  . Cholecalciferol (VITAMIN D) 2000 UNITS tablet Take by mouth daily. 5,000 IU daily    . citalopram (CELEXA) 10 MG tablet TAKE 1 TABLET BY MOUTH EVERY DAY 30 tablet 5  . clindamycin (CLEOCIN) 150 MG capsule TAKE 4 CAPSULES 1 HOUR BEFORE APPOINTMENT 12 capsule 0  . Fish Oil-Cholecalciferol (FISH OIL + D3 PO) Take by mouth.    Marland Kitchen glycerin adult (GLYCERIN ADULT) 2 G SUPP Place 1 suppository rectally daily.     Marland Kitchen HYDROcodone-acetaminophen (NORCO/VICODIN) 5-325 MG tablet TAKE 1/2 TO 1 TABLET BY MOUTH EVERY 4 HOURS WHILE AWAKE AS NEEDED  0  . predniSONE (STERAPRED UNI-PAK 21 TAB) 5 MG (21) TBPK tablet Take 6 tablets x 1 day, 5 tablets x 1 day, 4 tablets x 1 day, 3 tablets x 1 day, 2 tablets x 1 day, 1 tablet x 1 day 21 tablet 0  . Probiotic Product (ALIGN) 4 MG CAPS Take 1 capsule by mouth daily.     Marland Kitchen senna-docusate (SENOKOT-S) 8.6-50 MG per tablet Take 1 tablet by mouth every evening.    . sodium chloride (MURO 128) 5 % ophthalmic ointment Place 1 drop into both eyes daily.    . VOLTAREN 1 % GEL APPLY TO AFFECTED AREA EVERY DAY AS NEEDED BACK PAIN 400 g 5   No current facility-administered medications on file prior to visit.     ALLERGIES: Allergies  Allergen Reactions  . Flexeril [Cyclobenzaprine]     nightmares  . Methenamine Mandelate     REACTION: burning sensation  . Morphine Sulfate     REACTION: blister  . Penicillins     REACTION: urticaria (hives)  . Promethazine Hcl     REACTION: hallucinations  . Statins Other (See Comments)    Muscle pain     FAMILY HISTORY: Family History  Problem Relation Age of Onset  . Lung cancer  Sister   . Colon cancer Sister 70  . Kidney cancer Sister     SOCIAL HISTORY: Social History   Social History  . Marital status: Married    Spouse name: N/A  . Number of children: 6  . Years of education: N/A   Occupational History  . Not on file.   Social History Main Topics  . Smoking status: Never Smoker  . Smokeless tobacco: Never Used  . Alcohol use No  . Drug use: No  . Sexual activity: Not on file     Comment: not asked   Other Topics Concern  . Not on file   Social History Narrative  . No narrative  on file    REVIEW OF SYSTEMS: Constitutional: No fevers, chills, or sweats, no generalized fatigue, change in appetite Eyes: No visual changes, double vision, eye pain Ear, nose and throat: No hearing loss, ear pain, nasal congestion, sore throat Cardiovascular: No chest pain, palpitations Respiratory:  No shortness of breath at rest or with exertion, wheezes GastrointestinaI: No nausea, vomiting, diarrhea, abdominal pain, fecal incontinence Genitourinary:  No dysuria, urinary retention or frequency Musculoskeletal:  No neck pain, +back pain Integumentary: No rash, pruritus, skin lesions Neurological: as above Psychiatric: No depression, insomnia, anxiety Endocrine: No palpitations, fatigue, diaphoresis, mood swings, change in appetite, change in weight, increased thirst Hematologic/Lymphatic:  No anemia, purpura, petechiae. Allergic/Immunologic: no itchy/runny eyes, nasal congestion, recent allergic reactions, rashes  PHYSICAL EXAM: Vitals:   10/13/17 1349  BP: 122/64  Pulse: (!) 58  SpO2: 96%   General: No acute distress Head:  Normocephalic/atraumatic Eyes: Fundoscopic exam shows bilateral sharp discs, no vessel changes, exudates, or hemorrhages Neck: supple, no paraspinal tenderness, full range of motion Back: No paraspinal tenderness Heart: regular rate and rhythm Lungs: Clear to auscultation bilaterally. Vascular: No carotid  bruits. Skin/Extremities: No rash, no edema Neurological Exam: Mental status: alert and oriented to person, place, and season/day of week. States it is Sept 1918. No dysarthria or aphasia, Fund of knowledge is appropriate.  Recent and remote memory are impaired.  Attention and concentration are normal.    Able to name objects and repeat phrases. CDT 4/5 MMSE - Mini Mental State Exam 10/13/2017 08/04/2017  Orientation to time 2 2  Orientation to Place 4 3  Registration 3 3  Attention/ Calculation 4 5  Recall 1 0  Language- name 2 objects 2 2  Language- repeat 1 1  Language- follow 3 step command 2 3  Language- read & follow direction 1 1  Write a sentence 1 1  Copy design 0 1  Total score 21 22   Cranial nerves: CN I: not tested CN II: pupils equal, round and reactive to light, visual fields intact, fundi unremarkable. CN III, IV, VI:  full range of motion, no nystagmus, no ptosis CN V: facial sensation intact CN VII: upper and lower face symmetric CN VIII: hard of hearing, hearing aid in right ear CN IX, X: gag intact, uvula midline CN XI: sternocleidomastoid and trapezius muscles intact CN XII: tongue midline Bulk & Tone: normal, no fasciculations. Motor: 5/5 throughout with no pronator drift. Sensation: intact to light touch, cold, pin, vibration and joint position sense.  No extinction to double simultaneous stimulation.  Romberg test negative Deep Tendon Reflexes: +2 throughout, no ankle clonus Plantar responses: downgoing bilaterally Cerebellar: no incoordination on finger to nose, heel to shin. No dysdiadochokinesia Gait: slow and cautious due to back pain, no ataxia, unable to tandem walk Tremor: none  IMPRESSION: This is a pleasant 83 year old right-handed woman with a history of hyperlipidemia, colon cancer, presenting for evaluation of worsening memory. Her neurological exam is non-focal, MMSE today 21/30 (22/30 in PCP office last August 2018). Symptoms suggestive of  mild dementia. Findings were discussed with the patient and her husband. He expressed concern for "mini-strokes," we discussed the low likelihood of stroke with non-focal exam, symptoms of word-finding and cognitive difficulties most consistent with mild dementia, likely Alzheimer's. We discussed diagnosis and prognosis. At this point, no clear indication to start cholinesterase inhibitors, she tried Aricept in the past. We discussed the importance of physical exercise and brain stimulation exercises for brain health. She is  on a daily baby aspirin, no indication to change dose discussed with husband. All their questions and concerns were answered, we discussed eventual need for more help at home, continue to monitor home safety. She will continue follow-up with her PCP and follow-up in our office on a prn basis. They know to call for any questions.   Thank you for allowing me to participate in the care of this patient. Please do not hesitate to call for any questions or concerns.   Ellouise Newer, M.D.  CC: Mauricio Po, NP, Dr. Sharlet Salina

## 2017-10-13 NOTE — Patient Instructions (Signed)
FALL PRECAUTIONS: Be cautious when walking. Scan the area for obstacles that may increase the risk of trips and falls. When getting up in the mornings, sit up at the edge of the bed for a few minutes before getting out of bed. Consider elevating the bed at the head end to avoid drop of blood pressure when getting up. Walk always in a well-lit room (use night lights in the walls). Avoid area rugs or power cords from appliances in the middle of the walkways. Use a walker or a cane if necessary and consider physical therapy for balance exercise. Get your eyesight checked regularly.  FINANCIAL OVERSIGHT: Supervision, especially oversight when making financial decisions or transactions is also recommended.  HOME SAFETY: Consider the safety of the kitchen when operating appliances like stoves, microwave oven, and blender. Consider having supervision and share cooking responsibilities until no longer able to participate in those. Accidents with firearms and other hazards in the house should be identified and addressed as well.  DRIVING: Regarding driving, in patients with progressive memory problems, driving will be impaired. We advise to have someone else do the driving if trouble finding directions or if minor accidents are reported. Independent driving assessment is available to determine safety of driving.  ABILITY TO BE LEFT ALONE: If patient is unable to contact 911 operator, consider using LifeLine, or when the need is there, arrange for someone to stay with patients. Smoking is a fire hazard, consider supervision or cessation. Risk of wandering should be assessed by caregiver and if detected at any point, supervision and safe proof recommendations should be instituted.  MEDICATION SUPERVISION: Inability to self-administer medication needs to be constantly addressed. Implement a mechanism to ensure safe administration of the medications.  RECOMMENDATIONS FOR ALL PATIENTS WITH MEMORY PROBLEMS: 1.  Continue to exercise (Recommend 30 minutes of walking everyday, or 3 hours every week) 2. Increase social interactions - continue going to [place] and enjoy social gatherings with friends and family 3. Eat healthy, avoid fried foods and eat more fruits and vegetables 4. Maintain adequate blood pressure, blood sugar, and blood cholesterol level. Reducing the risk of stroke and cardiovascular disease also helps promoting better memory. 5. Avoid stressful situations. Live a simple life and avoid aggravations. Organize your time and prepare for the next day in anticipation. 6. Sleep well, avoid any interruptions of sleep and avoid any distractions in the bedroom that may interfere with adequate sleep quality 7. Avoid sugar, avoid sweets as there is a strong link between excessive sugar intake, diabetes, and cognitive impairment We discussed the Mediterranean diet, which has been shown to help patients reduce the risk of progressive memory disorders and reduces cardiovascular risk. This includes eating fish, eat fruits and green leafy vegetables, nuts like almonds and hazelnuts, walnuts, and also use olive oil. Avoid fast foods and fried foods as much as possible. Avoid sweets and sugar as sugar use has been linked to worsening of memory function.  There is always a concern of gradual progression of memory problems. If this is the case, then we may need to adjust level of care according to patient needs. Support, both to the patient and caregiver, should then be put into place.

## 2017-10-16 DIAGNOSIS — Z23 Encounter for immunization: Secondary | ICD-10-CM | POA: Diagnosis not present

## 2017-10-30 ENCOUNTER — Ambulatory Visit (INDEPENDENT_AMBULATORY_CARE_PROVIDER_SITE_OTHER): Payer: Medicare Other | Admitting: Family Medicine

## 2017-10-30 ENCOUNTER — Encounter: Payer: Self-pay | Admitting: Family Medicine

## 2017-10-30 VITALS — BP 122/74 | HR 74 | Temp 98.6°F | Ht 59.0 in | Wt 117.0 lb

## 2017-10-30 DIAGNOSIS — H6123 Impacted cerumen, bilateral: Secondary | ICD-10-CM

## 2017-10-30 NOTE — Progress Notes (Signed)
[name] Barker - 83 y.o. female MRN [id number]  Date of birth: Jul 25, 1921  SUBJECTIVE:  Including CC & ROS.  Chief Complaint  Patient presents with  . Ear Fullness    ongoing for one month     [name] is a 83 y.o. female that is Presenting with changes in her hearing and ear fullness for the past month. She wears hearing aids. Denies any tinnitus. Has not had any fevers or chills. Has not had any pain or discharge from her ears. No recent viral illnesses..   Review of Systems  HENT: Positive for hearing loss. Negative for ear discharge and ear pain.     HISTORY: Past Medical, Surgical, Social, and Family History Reviewed & Updated per EMR.   Pertinent Historical Findings include:  Past Medical History:  Diagnosis Date  . Allergic rhinitis   . ANEMIA-NOS 11/17/2006  . Colon cancer ([place]) 08/19/2007   1994 T2, 2008 T3, N1  . DIVERTICULOSIS-COLON 05/30/2010  . GERD 11/17/2006  . Hemorrhoids   . HYPERLIPIDEMIA 11/17/2006  . LOW BACK PAIN, CHRONIC 02/04/2010  . Osteopenia 11/17/2006  . Small bowel obstruction ([place])   . TIA (transient ischemic attack)     Past Surgical History:  Procedure Laterality Date  . ABDOMINAL HYSTERECTOMY    . APPENDECTOMY    . BACK SURGERY  2007  . BELPHAROPTOSIS REPAIR    . CATARACT EXTRACTION    . HEMICOLECTOMY  1994   Right  . JOINT REPLACEMENT    . KNEE ARTHROSCOPY    . PARTIAL COLECTOMY  2008  . ROTATOR CUFF REPAIR    . SHOULDER SURGERY    . VAGINAL PROLAPSE REPAIR      Allergies  Allergen Reactions  . Flexeril [Cyclobenzaprine]     nightmares  . Methenamine Mandelate     REACTION: burning sensation  . Morphine Sulfate     REACTION: blister  . Penicillins     REACTION: urticaria (hives)  . Promethazine Hcl     REACTION: hallucinations  . Statins Other (See Comments)    Muscle pain     Family History  Problem Relation Age of Onset  . Lung cancer Sister   . Colon cancer Sister 21  . Kidney cancer Sister       Social History   Social History  . Marital status: Married    Spouse name: N/A  . Number of children: 6  . Years of education: N/A   Occupational History  . Not on file.   Social History Main Topics  . Smoking status: Never Smoker  . Smokeless tobacco: Never Used  . Alcohol use No  . Drug use: No  . Sexual activity: Not on file     Comment: not asked   Other Topics Concern  . Not on file   Social History Narrative   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     PHYSICAL EXAM:  VS: BP 122/74 (BP Location: Left Arm, Patient Position: Sitting, Cuff Size: Normal)   Pulse 74   Temp 98.6 F (37 C) (Oral)   Ht 4\' 11"  (1.499 m)   Wt 117 lb (53.1 kg)   SpO2 98%   BMI 23.63 kg/m  Physical Exam Gen: NAD,  alert, cooperative with exam,  ENT: normal lips, normal nasal mucosa,Cerumen impaction bilaterally  Eye: normal EOM, normal conjunctiva and lids CV:  no edema, +2 pedal pulses   Resp: no accessory muscle use, non-labored,  Skin: no rashes, no areas of induration  Neuro: normal tone, normal sensation to touch Psych:  normal insight, alert and oriented MSK: Walking with single-point cane,      ASSESSMENT & PLAN:   Bilateral impacted cerumen Irrigation today that was successful. - Follow-up as needed

## 2017-10-30 NOTE — Assessment & Plan Note (Signed)
Irrigation today that was successful. - Follow-up as needed

## 2017-10-30 NOTE — Patient Instructions (Signed)
Thank you for coming in,   Please follow up with [place] if your ears seem to get clogged again.    Please feel free to call with any questions or concerns at any time, at [phone]. --Dr. Raeford Razor

## 2017-11-02 DIAGNOSIS — R1030 Lower abdominal pain, unspecified: Secondary | ICD-10-CM | POA: Diagnosis not present

## 2017-11-02 DIAGNOSIS — M4608 Spinal enthesopathy, sacral and sacrococcygeal region: Secondary | ICD-10-CM | POA: Diagnosis not present

## 2017-11-02 DIAGNOSIS — G894 Chronic pain syndrome: Secondary | ICD-10-CM | POA: Diagnosis not present

## 2017-11-02 DIAGNOSIS — Z79891 Long term (current) use of opiate analgesic: Secondary | ICD-10-CM | POA: Diagnosis not present

## 2017-12-18 DIAGNOSIS — M1711 Unilateral primary osteoarthritis, right knee: Secondary | ICD-10-CM | POA: Diagnosis not present

## 2018-02-01 DIAGNOSIS — R1030 Lower abdominal pain, unspecified: Secondary | ICD-10-CM | POA: Diagnosis not present

## 2018-02-01 DIAGNOSIS — M4608 Spinal enthesopathy, sacral and sacrococcygeal region: Secondary | ICD-10-CM | POA: Diagnosis not present

## 2018-02-01 DIAGNOSIS — Z79891 Long term (current) use of opiate analgesic: Secondary | ICD-10-CM | POA: Diagnosis not present

## 2018-02-01 DIAGNOSIS — G894 Chronic pain syndrome: Secondary | ICD-10-CM | POA: Diagnosis not present

## 2018-02-09 DIAGNOSIS — M25561 Pain in right knee: Secondary | ICD-10-CM | POA: Diagnosis not present

## 2018-02-09 DIAGNOSIS — M1711 Unilateral primary osteoarthritis, right knee: Secondary | ICD-10-CM | POA: Diagnosis not present

## 2018-02-12 ENCOUNTER — Other Ambulatory Visit (INDEPENDENT_AMBULATORY_CARE_PROVIDER_SITE_OTHER): Payer: Medicare Other

## 2018-02-12 ENCOUNTER — Encounter: Payer: Self-pay | Admitting: Internal Medicine

## 2018-02-12 ENCOUNTER — Ambulatory Visit (INDEPENDENT_AMBULATORY_CARE_PROVIDER_SITE_OTHER): Payer: Medicare Other | Admitting: Internal Medicine

## 2018-02-12 VITALS — BP 110/62 | HR 55 | Temp 97.7°F | Ht 59.0 in | Wt 123.0 lb

## 2018-02-12 DIAGNOSIS — E559 Vitamin D deficiency, unspecified: Secondary | ICD-10-CM

## 2018-02-12 DIAGNOSIS — R5383 Other fatigue: Secondary | ICD-10-CM

## 2018-02-12 DIAGNOSIS — E785 Hyperlipidemia, unspecified: Secondary | ICD-10-CM

## 2018-02-12 DIAGNOSIS — E538 Deficiency of other specified B group vitamins: Secondary | ICD-10-CM

## 2018-02-12 LAB — CBC
HCT: 34.4 % — ABNORMAL LOW (ref 36.0–46.0)
Hemoglobin: 11.4 g/dL — ABNORMAL LOW (ref 12.0–15.0)
MCHC: 33.2 g/dL (ref 30.0–36.0)
MCV: 90.7 fl (ref 78.0–100.0)
Platelets: 232 10*3/uL (ref 150.0–400.0)
RBC: 3.79 Mil/uL — ABNORMAL LOW (ref 3.87–5.11)
RDW: 12.9 % (ref 11.5–15.5)
WBC: 5.6 10*3/uL (ref 4.0–10.5)

## 2018-02-12 LAB — LIPID PANEL
Cholesterol: 195 mg/dL (ref 0–200)
HDL: 46.2 mg/dL (ref >39.00)
LDL Cholesterol: 129 mg/dL — ABNORMAL HIGH (ref 0–99)
NonHDL: 148.94
Total CHOL/HDL Ratio: 4
Triglycerides: 101 mg/dL (ref 0.0–149.0)
VLDL: 20.2 mg/dL (ref 0.0–40.0)

## 2018-02-12 LAB — COMPREHENSIVE METABOLIC PANEL
ALT: 9 U/L (ref 0–35)
AST: 18 U/L (ref 0–37)
Albumin: 3.7 g/dL (ref 3.5–5.2)
Alkaline Phosphatase: 55 U/L (ref 39–117)
BUN: 20 mg/dL (ref 6–23)
CO2: 32 mEq/L (ref 19–32)
Calcium: 8.9 mg/dL (ref 8.4–10.5)
Chloride: 97 mEq/L (ref 96–112)
Creatinine, Ser: 0.76 mg/dL (ref 0.40–1.20)
GFR: 75.2 mL/min (ref >60.00)
Glucose, Bld: 82 mg/dL (ref 70–99)
Potassium: 4.2 mEq/L (ref 3.5–5.1)
Sodium: 135 mEq/L (ref 135–145)
Total Bilirubin: 0.3 mg/dL (ref 0.2–1.2)
Total Protein: 6.6 g/dL (ref 6.0–8.3)

## 2018-02-12 LAB — VITAMIN B12: Vitamin B-12: 593 pg/mL (ref 211–911)

## 2018-02-12 LAB — TSH: TSH: 3.5 u[IU]/mL (ref 0.35–4.50)

## 2018-02-12 LAB — VITAMIN D 25 HYDROXY (VIT D DEFICIENCY, FRACTURES): VITD: 50.12 ng/mL (ref 30.00–100.00)

## 2018-02-12 LAB — T4, FREE: Free T4: 0.65 ng/dL (ref 0.60–1.60)

## 2018-02-12 NOTE — Patient Instructions (Signed)
We will check the labs today and call you back with the results.    

## 2018-02-12 NOTE — Progress Notes (Signed)
   Subjective:    Patient ID: [name] Barker, female    DOB: [birth year], 84 y.o.   MRN: [id number]  HPI The patient is a 84 YO female coming in for fatigue. This is going on for some time but worse in the last 2-3 months. She is sleeping in until noon and then is tired. She is able to start doing things and wake up. She gets tired easily. She sleeps well at night time. Denies depression and doing well on celexa. She denies loss of weight. Eating well. Denies chest pains or SOB. Denies stomach problems including nausea, vomiting, diarrhea, constipation.   Review of Systems  Constitutional: Positive for activity change and fatigue. Negative for appetite change, fever and unexpected weight change.  HENT: Negative.   Eyes: Negative.   Respiratory: Negative for cough, chest tightness and shortness of breath.   Cardiovascular: Negative for chest pain, palpitations and leg swelling.  Gastrointestinal: Negative for abdominal distention, abdominal pain, constipation, diarrhea, nausea and vomiting.  Musculoskeletal: Negative.   Skin: Negative.   Neurological: Negative.   Psychiatric/Behavioral: Negative.       Objective:   Physical Exam  Constitutional: She is oriented to person, place, and time. She appears well-developed and well-nourished.  HENT:  Head: Normocephalic and atraumatic.  Eyes: EOM are normal.  Neck: Normal range of motion.  Cardiovascular: Normal rate and regular rhythm.  Pulmonary/Chest: Effort normal and breath sounds normal. No respiratory distress. She has no wheezes. She has no rales.  Abdominal: Soft. Bowel sounds are normal. She exhibits no distension. There is no tenderness. There is no rebound.  Musculoskeletal: She exhibits no edema.  Neurological: She is alert and oriented to person, place, and time. Coordination normal.  Some memory change noticeable with confusion about dates  Skin: Skin is warm and dry.  Psychiatric: She has a normal mood and affect.    Vitals:   02/12/18 1522  BP: 110/62  Pulse: (!) 55  Temp: 97.7 F (36.5 C)  TempSrc: Oral  SpO2: 95%  Weight: 123 lb (55.8 kg)  Height: 4\' 11"  (1.499 m)      Assessment & Plan:

## 2018-02-12 NOTE — Assessment & Plan Note (Signed)
Checking TSH, free t4, CBC, CMP, B12, vitamin D. Adjust as needed.

## 2018-02-16 DIAGNOSIS — R351 Nocturia: Secondary | ICD-10-CM | POA: Diagnosis not present

## 2018-02-16 DIAGNOSIS — R8279 Other abnormal findings on microbiological examination of urine: Secondary | ICD-10-CM | POA: Diagnosis not present

## 2018-02-18 DIAGNOSIS — M25561 Pain in right knee: Secondary | ICD-10-CM | POA: Diagnosis not present

## 2018-02-22 DIAGNOSIS — M62838 Other muscle spasm: Secondary | ICD-10-CM | POA: Diagnosis not present

## 2018-02-22 DIAGNOSIS — M6281 Muscle weakness (generalized): Secondary | ICD-10-CM | POA: Diagnosis not present

## 2018-02-22 DIAGNOSIS — R351 Nocturia: Secondary | ICD-10-CM | POA: Diagnosis not present

## 2018-02-22 DIAGNOSIS — N3020 Other chronic cystitis without hematuria: Secondary | ICD-10-CM | POA: Diagnosis not present

## 2018-02-23 DIAGNOSIS — M25561 Pain in right knee: Secondary | ICD-10-CM | POA: Diagnosis not present

## 2018-02-23 DIAGNOSIS — M1711 Unilateral primary osteoarthritis, right knee: Secondary | ICD-10-CM | POA: Diagnosis not present

## 2018-03-02 DIAGNOSIS — R351 Nocturia: Secondary | ICD-10-CM | POA: Diagnosis not present

## 2018-03-02 DIAGNOSIS — M62838 Other muscle spasm: Secondary | ICD-10-CM | POA: Diagnosis not present

## 2018-03-02 DIAGNOSIS — R3915 Urgency of urination: Secondary | ICD-10-CM | POA: Diagnosis not present

## 2018-03-02 DIAGNOSIS — M6281 Muscle weakness (generalized): Secondary | ICD-10-CM | POA: Diagnosis not present

## 2018-03-02 DIAGNOSIS — R350 Frequency of micturition: Secondary | ICD-10-CM | POA: Diagnosis not present

## 2018-03-02 DIAGNOSIS — R3912 Poor urinary stream: Secondary | ICD-10-CM | POA: Diagnosis not present

## 2018-03-14 ENCOUNTER — Other Ambulatory Visit: Payer: Self-pay | Admitting: Internal Medicine

## 2018-03-22 DIAGNOSIS — H1851 Endothelial corneal dystrophy: Secondary | ICD-10-CM | POA: Diagnosis not present

## 2018-03-22 DIAGNOSIS — Z961 Presence of intraocular lens: Secondary | ICD-10-CM | POA: Diagnosis not present

## 2018-03-22 DIAGNOSIS — H35373 Puckering of macula, bilateral: Secondary | ICD-10-CM | POA: Diagnosis not present

## 2018-04-14 DIAGNOSIS — M4608 Spinal enthesopathy, sacral and sacrococcygeal region: Secondary | ICD-10-CM | POA: Diagnosis not present

## 2018-04-14 DIAGNOSIS — G894 Chronic pain syndrome: Secondary | ICD-10-CM | POA: Diagnosis not present

## 2018-04-14 DIAGNOSIS — Z79891 Long term (current) use of opiate analgesic: Secondary | ICD-10-CM | POA: Diagnosis not present

## 2018-04-14 DIAGNOSIS — R1030 Lower abdominal pain, unspecified: Secondary | ICD-10-CM | POA: Diagnosis not present

## 2018-06-08 ENCOUNTER — Other Ambulatory Visit: Payer: Self-pay | Admitting: Internal Medicine

## 2018-06-22 ENCOUNTER — Other Ambulatory Visit: Payer: Self-pay

## 2018-06-22 MED ORDER — CITALOPRAM HYDROBROMIDE 10 MG PO TABS: 10.0000 mg | ORAL_TABLET | Freq: Every day | ORAL | 0 refills | Status: DC

## 2018-06-22 NOTE — Telephone Encounter (Signed)
Copied from [place] [id number]. Topic: General - Other >> Jun 22, 2018  1:40 PM Mcneil, Ja-Kwan wrote: Reason for CRM: Pt husband Jaquelyn Bitter requests that the Rx for citalopram (CELEXA) 10 MG tablet be changed to a 90 day supply. Cb# [phone]

## 2018-06-22 NOTE — Telephone Encounter (Signed)
Is it okay to send in 90 day supply?

## 2018-07-14 DIAGNOSIS — R1030 Lower abdominal pain, unspecified: Secondary | ICD-10-CM | POA: Diagnosis not present

## 2018-07-14 DIAGNOSIS — M4608 Spinal enthesopathy, sacral and sacrococcygeal region: Secondary | ICD-10-CM | POA: Diagnosis not present

## 2018-07-14 DIAGNOSIS — Z79891 Long term (current) use of opiate analgesic: Secondary | ICD-10-CM | POA: Diagnosis not present

## 2018-07-14 DIAGNOSIS — G894 Chronic pain syndrome: Secondary | ICD-10-CM | POA: Diagnosis not present

## 2018-09-12 ENCOUNTER — Other Ambulatory Visit: Payer: Self-pay | Admitting: Internal Medicine

## 2018-09-13 ENCOUNTER — Inpatient Hospital Stay (HOSPITAL_COMMUNITY): Payer: Medicare Other | Admitting: Certified Registered"

## 2018-09-13 ENCOUNTER — Inpatient Hospital Stay (HOSPITAL_COMMUNITY): Payer: Medicare Other

## 2018-09-13 ENCOUNTER — Emergency Department (HOSPITAL_COMMUNITY): Payer: Medicare Other

## 2018-09-13 ENCOUNTER — Other Ambulatory Visit: Payer: Self-pay

## 2018-09-13 ENCOUNTER — Encounter (HOSPITAL_COMMUNITY)
Admission: EM | Disposition: A | Discharge status: Home Health | Payer: Self-pay | Source: Home / Self Care | Attending: Internal Medicine

## 2018-09-13 ENCOUNTER — Inpatient Hospital Stay (HOSPITAL_COMMUNITY)
Admission: EM | Admit: 2018-09-13 | Discharge: 2018-09-15 | DRG: 481 | Disposition: A | Discharge status: Home Health | Payer: Medicare Other | Attending: Internal Medicine | Admitting: Internal Medicine

## 2018-09-13 ENCOUNTER — Encounter (HOSPITAL_COMMUNITY): Payer: Self-pay | Admitting: Emergency Medicine

## 2018-09-13 DIAGNOSIS — Z79899 Other long term (current) drug therapy: Secondary | ICD-10-CM | POA: Diagnosis not present

## 2018-09-13 DIAGNOSIS — W19XXXA Unspecified fall, initial encounter: Secondary | ICD-10-CM

## 2018-09-13 DIAGNOSIS — Z885 Allergy status to narcotic agent status: Secondary | ICD-10-CM | POA: Diagnosis not present

## 2018-09-13 DIAGNOSIS — M21952 Unspecified acquired deformity of left thigh: Secondary | ICD-10-CM | POA: Diagnosis not present

## 2018-09-13 DIAGNOSIS — Z96652 Presence of left artificial knee joint: Secondary | ICD-10-CM | POA: Diagnosis present

## 2018-09-13 DIAGNOSIS — Z23 Encounter for immunization: Secondary | ICD-10-CM

## 2018-09-13 DIAGNOSIS — F329 Major depressive disorder, single episode, unspecified: Secondary | ICD-10-CM | POA: Diagnosis not present

## 2018-09-13 DIAGNOSIS — Z880 Allergy status to penicillin: Secondary | ICD-10-CM | POA: Diagnosis not present

## 2018-09-13 DIAGNOSIS — M810 Age-related osteoporosis without current pathological fracture: Secondary | ICD-10-CM | POA: Diagnosis not present

## 2018-09-13 DIAGNOSIS — M2550 Pain in unspecified joint: Secondary | ICD-10-CM | POA: Diagnosis not present

## 2018-09-13 DIAGNOSIS — Z419 Encounter for procedure for purposes other than remedying health state, unspecified: Secondary | ICD-10-CM | POA: Diagnosis not present

## 2018-09-13 DIAGNOSIS — S299XXA Unspecified injury of thorax, initial encounter: Secondary | ICD-10-CM | POA: Diagnosis not present

## 2018-09-13 DIAGNOSIS — E876 Hypokalemia: Secondary | ICD-10-CM | POA: Diagnosis not present

## 2018-09-13 DIAGNOSIS — S72012A Unspecified intracapsular fracture of left femur, initial encounter for closed fracture: Secondary | ICD-10-CM | POA: Diagnosis not present

## 2018-09-13 DIAGNOSIS — W010XXA Fall on same level from slipping, tripping and stumbling without subsequent striking against object, initial encounter: Secondary | ICD-10-CM | POA: Diagnosis present

## 2018-09-13 DIAGNOSIS — Z888 Allergy status to other drugs, medicaments and biological substances status: Secondary | ICD-10-CM

## 2018-09-13 DIAGNOSIS — R52 Pain, unspecified: Secondary | ICD-10-CM | POA: Diagnosis not present

## 2018-09-13 DIAGNOSIS — M8580 Other specified disorders of bone density and structure, unspecified site: Secondary | ICD-10-CM | POA: Diagnosis present

## 2018-09-13 DIAGNOSIS — S7292XA Unspecified fracture of left femur, initial encounter for closed fracture: Secondary | ICD-10-CM | POA: Diagnosis not present

## 2018-09-13 DIAGNOSIS — Z7982 Long term (current) use of aspirin: Secondary | ICD-10-CM | POA: Diagnosis not present

## 2018-09-13 DIAGNOSIS — I959 Hypotension, unspecified: Secondary | ICD-10-CM | POA: Diagnosis not present

## 2018-09-13 DIAGNOSIS — I2510 Atherosclerotic heart disease of native coronary artery without angina pectoris: Secondary | ICD-10-CM | POA: Diagnosis present

## 2018-09-13 DIAGNOSIS — S79929A Unspecified injury of unspecified thigh, initial encounter: Secondary | ICD-10-CM | POA: Diagnosis not present

## 2018-09-13 DIAGNOSIS — Z881 Allergy status to other antibiotic agents status: Secondary | ICD-10-CM

## 2018-09-13 DIAGNOSIS — D62 Acute posthemorrhagic anemia: Secondary | ICD-10-CM | POA: Diagnosis not present

## 2018-09-13 DIAGNOSIS — G8929 Other chronic pain: Secondary | ICD-10-CM | POA: Diagnosis not present

## 2018-09-13 DIAGNOSIS — I493 Ventricular premature depolarization: Secondary | ICD-10-CM | POA: Diagnosis present

## 2018-09-13 DIAGNOSIS — Z7401 Bed confinement status: Secondary | ICD-10-CM | POA: Diagnosis not present

## 2018-09-13 DIAGNOSIS — M545 Low back pain, unspecified: Secondary | ICD-10-CM | POA: Diagnosis present

## 2018-09-13 DIAGNOSIS — F32A Depression, unspecified: Secondary | ICD-10-CM | POA: Diagnosis present

## 2018-09-13 DIAGNOSIS — D649 Anemia, unspecified: Secondary | ICD-10-CM

## 2018-09-13 DIAGNOSIS — Y92000 Kitchen of unspecified non-institutional (private) residence as  the place of occurrence of the external cause: Secondary | ICD-10-CM

## 2018-09-13 DIAGNOSIS — S72402A Unspecified fracture of lower end of left femur, initial encounter for closed fracture: Secondary | ICD-10-CM | POA: Diagnosis not present

## 2018-09-13 DIAGNOSIS — E559 Vitamin D deficiency, unspecified: Secondary | ICD-10-CM | POA: Diagnosis not present

## 2018-09-13 DIAGNOSIS — E785 Hyperlipidemia, unspecified: Secondary | ICD-10-CM | POA: Diagnosis not present

## 2018-09-13 DIAGNOSIS — F0390 Unspecified dementia without behavioral disturbance: Secondary | ICD-10-CM | POA: Diagnosis not present

## 2018-09-13 DIAGNOSIS — K5900 Constipation, unspecified: Secondary | ICD-10-CM | POA: Diagnosis present

## 2018-09-13 DIAGNOSIS — M9712XA Periprosthetic fracture around internal prosthetic left knee joint, initial encounter: Secondary | ICD-10-CM | POA: Diagnosis not present

## 2018-09-13 DIAGNOSIS — Z8673 Personal history of transient ischemic attack (TIA), and cerebral infarction without residual deficits: Secondary | ICD-10-CM

## 2018-09-13 DIAGNOSIS — R404 Transient alteration of awareness: Secondary | ICD-10-CM | POA: Diagnosis not present

## 2018-09-13 DIAGNOSIS — T1490XA Injury, unspecified, initial encounter: Secondary | ICD-10-CM

## 2018-09-13 DIAGNOSIS — M1990 Unspecified osteoarthritis, unspecified site: Secondary | ICD-10-CM | POA: Diagnosis present

## 2018-09-13 DIAGNOSIS — R0902 Hypoxemia: Secondary | ICD-10-CM | POA: Diagnosis not present

## 2018-09-13 DIAGNOSIS — F3342 Major depressive disorder, recurrent, in full remission: Secondary | ICD-10-CM

## 2018-09-13 DIAGNOSIS — F321 Major depressive disorder, single episode, moderate: Secondary | ICD-10-CM | POA: Diagnosis not present

## 2018-09-13 DIAGNOSIS — S72352A Displaced comminuted fracture of shaft of left femur, initial encounter for closed fracture: Secondary | ICD-10-CM | POA: Diagnosis not present

## 2018-09-13 DIAGNOSIS — S0990XA Unspecified injury of head, initial encounter: Secondary | ICD-10-CM | POA: Diagnosis not present

## 2018-09-13 DIAGNOSIS — L509 Urticaria, unspecified: Secondary | ICD-10-CM | POA: Diagnosis not present

## 2018-09-13 HISTORY — DX: Unspecified fracture of left femur, initial encounter for closed fracture: S72.92XA

## 2018-09-13 HISTORY — PX: ORIF FEMUR FRACTURE: SHX2119

## 2018-09-13 LAB — CBC WITH DIFFERENTIAL/PLATELET
Abs Immature Granulocytes: 0 10*3/uL (ref 0.0–0.1)
Basophils Absolute: 0 10*3/uL (ref 0.0–0.1)
Basophils Relative: 1 %
Eosinophils Absolute: 0.1 10*3/uL (ref 0.0–0.7)
Eosinophils Relative: 2 %
HCT: 33.4 % — ABNORMAL LOW (ref 36.0–46.0)
Hemoglobin: 10.5 g/dL — ABNORMAL LOW (ref 12.0–15.0)
Immature Granulocytes: 0 %
Lymphocytes Relative: 27 %
Lymphs Abs: 1.5 10*3/uL (ref 0.7–4.0)
MCH: 29.4 pg (ref 26.0–34.0)
MCHC: 31.4 g/dL (ref 30.0–36.0)
MCV: 93.6 fL (ref 78.0–100.0)
Monocytes Absolute: 0.6 10*3/uL (ref 0.1–1.0)
Monocytes Relative: 11 %
Neutro Abs: 3.3 10*3/uL (ref 1.7–7.7)
Neutrophils Relative %: 59 %
Platelets: 153 10*3/uL (ref 150–400)
RBC: 3.57 MIL/uL — ABNORMAL LOW (ref 3.87–5.11)
RDW: 12.9 % (ref 11.5–15.5)
WBC: 5.5 10*3/uL (ref 4.0–10.5)

## 2018-09-13 LAB — COMPREHENSIVE METABOLIC PANEL
ALT: 9 U/L (ref 0–44)
AST: 24 U/L (ref 15–41)
Albumin: 3.4 g/dL — ABNORMAL LOW (ref 3.5–5.0)
Alkaline Phosphatase: 54 U/L (ref 38–126)
Anion gap: 10 (ref 5–15)
BUN: 11 mg/dL (ref 8–23)
CO2: 27 mmol/L (ref 22–32)
Calcium: 8.5 mg/dL — ABNORMAL LOW (ref 8.9–10.3)
Chloride: 99 mmol/L (ref 98–111)
Creatinine, Ser: 0.53 mg/dL (ref 0.44–1.00)
GFR calc Af Amer: >60 mL/min (ref >60)
GFR calc non Af Amer: >60 mL/min (ref >60)
Glucose, Bld: 115 mg/dL — ABNORMAL HIGH (ref 70–99)
Potassium: 3.3 mmol/L — ABNORMAL LOW (ref 3.5–5.1)
Sodium: 136 mmol/L (ref 135–145)
Total Bilirubin: 0.3 mg/dL (ref 0.3–1.2)
Total Protein: 6.2 g/dL — ABNORMAL LOW (ref 6.5–8.1)

## 2018-09-13 LAB — SURGICAL PCR SCREEN
MRSA, PCR: NEGATIVE
Staphylococcus aureus: NEGATIVE

## 2018-09-13 LAB — PROTIME-INR
INR: 1.05
Prothrombin Time: 13.6 seconds (ref 11.4–15.2)

## 2018-09-13 SURGERY — OPEN REDUCTION INTERNAL FIXATION (ORIF) DISTAL FEMUR FRACTURE
Anesthesia: General | Site: Leg Upper | Laterality: Left

## 2018-09-13 MED ORDER — FENTANYL CITRATE (PF) 250 MCG/5ML IJ SOLN
INTRAMUSCULAR | Status: AC
  Filled 2018-09-13: qty 5

## 2018-09-13 MED ORDER — ROCURONIUM BROMIDE 50 MG/5ML IV SOSY
PREFILLED_SYRINGE | INTRAVENOUS | Status: AC
  Filled 2018-09-13: qty 5

## 2018-09-13 MED ORDER — PROPOFOL 10 MG/ML IV BOLUS
INTRAVENOUS | Status: DC | PRN
  Administered 2018-09-13: 50 mg via INTRAVENOUS

## 2018-09-13 MED ORDER — VANCOMYCIN HCL 1000 MG IV SOLR
INTRAVENOUS | Status: DC | PRN
  Administered 2018-09-13: 1000 mg via TOPICAL

## 2018-09-13 MED ORDER — HYDROMORPHONE HCL 1 MG/ML IJ SOLN
0.5000 mg | Freq: Once | INTRAMUSCULAR | Status: AC
  Administered 2018-09-13: 0.5 mg via INTRAVENOUS
  Filled 2018-09-13: qty 1

## 2018-09-13 MED ORDER — CEFAZOLIN SODIUM-DEXTROSE 2-4 GM/100ML-% IV SOLN
2.0000 g | INTRAVENOUS | Status: AC
  Administered 2018-09-13: 2 g via INTRAVENOUS
  Filled 2018-09-13: qty 100

## 2018-09-13 MED ORDER — CITALOPRAM HYDROBROMIDE 10 MG PO TABS
10.0000 mg | ORAL_TABLET | Freq: Every day | ORAL | Status: DC
  Administered 2018-09-13 – 2018-09-15 (×3): 10 mg via ORAL
  Filled 2018-09-13 (×3): qty 1

## 2018-09-13 MED ORDER — POTASSIUM CHLORIDE IN NACL 20-0.9 MEQ/L-% IV SOLN
INTRAVENOUS | Status: AC
  Administered 2018-09-13: 75 mL/h via INTRAVENOUS
  Filled 2018-09-13: qty 1000

## 2018-09-13 MED ORDER — ADULT MULTIVITAMIN W/MINERALS CH
1.0000 | ORAL_TABLET | Freq: Every day | ORAL | Status: DC
  Administered 2018-09-14 – 2018-09-15 (×2): 1 via ORAL
  Filled 2018-09-13 (×2): qty 1

## 2018-09-13 MED ORDER — LIDOCAINE 2% (20 MG/ML) 5 ML SYRINGE
INTRAMUSCULAR | Status: AC
  Filled 2018-09-13: qty 5

## 2018-09-13 MED ORDER — CEFAZOLIN SODIUM-DEXTROSE 1-4 GM/50ML-% IV SOLN
1.0000 g | Freq: Three times a day (TID) | INTRAVENOUS | Status: AC
  Administered 2018-09-14 (×3): 1 g via INTRAVENOUS
  Filled 2018-09-13 (×3): qty 50

## 2018-09-13 MED ORDER — EPHEDRINE 5 MG/ML INJ
INTRAVENOUS | Status: AC
  Filled 2018-09-13: qty 10

## 2018-09-13 MED ORDER — ALBUTEROL SULFATE (2.5 MG/3ML) 0.083% IN NEBU: 2.5000 mg | INHALATION_SOLUTION | Freq: Four times a day (QID) | RESPIRATORY_TRACT | Status: DC | PRN

## 2018-09-13 MED ORDER — ROCURONIUM BROMIDE 50 MG/5ML IV SOSY
PREFILLED_SYRINGE | INTRAVENOUS | Status: DC | PRN
  Administered 2018-09-13: 40 mg via INTRAVENOUS

## 2018-09-13 MED ORDER — SUGAMMADEX SODIUM 200 MG/2ML IV SOLN
INTRAVENOUS | Status: DC | PRN
  Administered 2018-09-13: 110 mg via INTRAVENOUS

## 2018-09-13 MED ORDER — ONDANSETRON HCL 4 MG/2ML IJ SOLN
INTRAMUSCULAR | Status: DC | PRN
  Administered 2018-09-13: 4 mg via INTRAVENOUS

## 2018-09-13 MED ORDER — BACITRACIN ZINC 500 UNIT/GM EX OINT
TOPICAL_OINTMENT | CUTANEOUS | Status: AC
  Filled 2018-09-13: qty 28.35

## 2018-09-13 MED ORDER — MAGNESIUM SULFATE IN D5W 1-5 GM/100ML-% IV SOLN
1.0000 g | Freq: Once | INTRAVENOUS | Status: AC
  Administered 2018-09-13: 1 g via INTRAVENOUS
  Filled 2018-09-13: qty 100

## 2018-09-13 MED ORDER — POLYETHYLENE GLYCOL 3350 17 G PO PACK
17.0000 g | PACK | Freq: Every day | ORAL | Status: DC | PRN
  Administered 2018-09-14: 17 g via ORAL
  Filled 2018-09-13: qty 1

## 2018-09-13 MED ORDER — HYDROCODONE-ACETAMINOPHEN 5-325 MG PO TABS
1.0000 | ORAL_TABLET | Freq: Four times a day (QID) | ORAL | Status: DC | PRN
  Administered 2018-09-14 – 2018-09-15 (×4): 1 via ORAL
  Filled 2018-09-13 (×4): qty 1

## 2018-09-13 MED ORDER — OMEGA-3-ACID ETHYL ESTERS 1 G PO CAPS
1.0000 g | ORAL_CAPSULE | Freq: Every day | ORAL | Status: DC
  Administered 2018-09-14 – 2018-09-15 (×2): 1 g via ORAL
  Filled 2018-09-13 (×2): qty 1

## 2018-09-13 MED ORDER — PROPOFOL 10 MG/ML IV BOLUS
INTRAVENOUS | Status: AC
  Filled 2018-09-13: qty 20

## 2018-09-13 MED ORDER — FENTANYL CITRATE (PF) 100 MCG/2ML IJ SOLN
INTRAMUSCULAR | Status: DC | PRN
  Administered 2018-09-13 (×3): 50 ug via INTRAVENOUS

## 2018-09-13 MED ORDER — FENTANYL CITRATE (PF) 100 MCG/2ML IJ SOLN: 25.0000 ug | INTRAMUSCULAR | Status: DC | PRN

## 2018-09-13 MED ORDER — DEXAMETHASONE SODIUM PHOSPHATE 10 MG/ML IJ SOLN
INTRAMUSCULAR | Status: DC | PRN
  Administered 2018-09-13: 10 mg via INTRAVENOUS

## 2018-09-13 MED ORDER — 0.9 % SODIUM CHLORIDE (POUR BTL) OPTIME
TOPICAL | Status: DC | PRN
  Administered 2018-09-13: 1000 mL

## 2018-09-13 MED ORDER — POTASSIUM CHLORIDE CRYS ER 20 MEQ PO TBCR: 20.0000 meq | EXTENDED_RELEASE_TABLET | Freq: Once | ORAL | Status: DC

## 2018-09-13 MED ORDER — ENSURE ENLIVE PO LIQD
237.0000 mL | Freq: Two times a day (BID) | ORAL | Status: DC
  Administered 2018-09-14 – 2018-09-15 (×3): 237 mL via ORAL

## 2018-09-13 MED ORDER — RISAQUAD PO CAPS
1.0000 | ORAL_CAPSULE | Freq: Every day | ORAL | Status: DC
  Administered 2018-09-14 – 2018-09-15 (×2): 1 via ORAL
  Filled 2018-09-13 (×2): qty 1

## 2018-09-13 MED ORDER — VITAMIN D 1000 UNITS PO TABS: 1000.0000 [IU] | ORAL_TABLET | Freq: Every day | ORAL | Status: DC

## 2018-09-13 MED ORDER — BACITRACIN 500 UNIT/GM EX OINT
TOPICAL_OINTMENT | CUTANEOUS | Status: DC | PRN
  Administered 2018-09-13: 1 via TOPICAL

## 2018-09-13 MED ORDER — SODIUM CHLORIDE (HYPERTONIC) 5 % OP SOLN
Freq: Every day | OPHTHALMIC | Status: DC
  Administered 2018-09-14 – 2018-09-15 (×2): via OPHTHALMIC
  Filled 2018-09-13 (×2): qty 15

## 2018-09-13 MED ORDER — INFLUENZA VAC SPLIT HIGH-DOSE 0.5 ML IM SUSY
0.5000 mL | PREFILLED_SYRINGE | INTRAMUSCULAR | Status: AC
  Administered 2018-09-14: 0.5 mL via INTRAMUSCULAR
  Filled 2018-09-13: qty 0.5

## 2018-09-13 MED ORDER — FISH OIL + D3 1200-1000 MG-UNIT PO CAPS: 1.0000 | ORAL_CAPSULE | Freq: Every day | ORAL | Status: DC

## 2018-09-13 MED ORDER — SENNOSIDES-DOCUSATE SODIUM 8.6-50 MG PO TABS
1.0000 | ORAL_TABLET | Freq: Every evening | ORAL | Status: DC
  Administered 2018-09-14: 1 via ORAL
  Filled 2018-09-13: qty 1

## 2018-09-13 MED ORDER — FENTANYL CITRATE (PF) 100 MCG/2ML IJ SOLN
12.5000 ug | INTRAMUSCULAR | Status: DC | PRN
  Administered 2018-09-13 (×2): 12.5 ug via INTRAVENOUS
  Filled 2018-09-13 (×2): qty 2

## 2018-09-13 MED ORDER — VANCOMYCIN HCL 1000 MG IV SOLR
INTRAVENOUS | Status: AC
  Filled 2018-09-13: qty 1000

## 2018-09-13 MED ORDER — FENTANYL CITRATE (PF) 100 MCG/2ML IJ SOLN
50.0000 ug | Freq: Once | INTRAMUSCULAR | Status: AC
  Administered 2018-09-13: 50 ug via INTRAVENOUS
  Filled 2018-09-13: qty 2

## 2018-09-13 MED ORDER — LIDOCAINE 2% (20 MG/ML) 5 ML SYRINGE
INTRAMUSCULAR | Status: DC | PRN
  Administered 2018-09-13: 60 mg via INTRAVENOUS

## 2018-09-13 MED ORDER — ONDANSETRON HCL 4 MG/2ML IJ SOLN
INTRAMUSCULAR | Status: AC
  Filled 2018-09-13: qty 2

## 2018-09-13 MED ORDER — EPHEDRINE SULFATE 50 MG/ML IJ SOLN
INTRAMUSCULAR | Status: DC | PRN
  Administered 2018-09-13: 10 mg via INTRAVENOUS

## 2018-09-13 MED ORDER — SODIUM CHLORIDE (HYPERTONIC) 5 % OP OINT
TOPICAL_OINTMENT | Freq: Every day | OPHTHALMIC | Status: DC
  Filled 2018-09-13: qty 3.5

## 2018-09-13 MED ORDER — LACTATED RINGERS IV SOLN
INTRAVENOUS | Status: DC
  Administered 2018-09-13 (×2): via INTRAVENOUS

## 2018-09-13 SURGICAL SUPPLY — 63 items
BANDAGE ELASTIC 6 VELCRO ST LF (GAUZE/BANDAGES/DRESSINGS) ×1 IMPLANT
BIT DRILL 4.3 (BIT) ×2
BIT DRILL 4.3X300MM (BIT) IMPLANT
BIT DRILL LONG 3.3 (BIT) ×1 IMPLANT
BIT DRILL QC 3.3X195 (BIT) ×1 IMPLANT
BNDG CMPR MED 10X6 ELC LF (GAUZE/BANDAGES/DRESSINGS) ×1
BNDG COHESIVE 6X5 TAN STRL LF (GAUZE/BANDAGES/DRESSINGS) ×2 IMPLANT
BNDG ELASTIC 6X10 VLCR STRL LF (GAUZE/BANDAGES/DRESSINGS) ×2 IMPLANT
BRUSH SCRUB SURG 4.25 DISP (MISCELLANEOUS) ×4 IMPLANT
CANISTER SUCT 3000ML PPV (MISCELLANEOUS) ×2 IMPLANT
CAP LOCK NCB (Cap) ×8 IMPLANT
CHLORAPREP W/TINT 26ML (MISCELLANEOUS) ×2 IMPLANT
COVER SURGICAL LIGHT HANDLE (MISCELLANEOUS) ×2 IMPLANT
DRAPE C-ARM 42X72 X-RAY (DRAPES) ×2 IMPLANT
DRAPE C-ARMOR (DRAPES) ×2 IMPLANT
DRAPE ORTHO SPLIT 77X108 STRL (DRAPES) ×4
DRAPE PROXIMA HALF (DRAPES) ×4 IMPLANT
DRAPE SURG 17X23 STRL (DRAPES) ×2 IMPLANT
DRAPE SURG ORHT 6 SPLT 77X108 (DRAPES) ×2 IMPLANT
DRAPE U-SHAPE 47X51 STRL (DRAPES) ×2 IMPLANT
DRSG MEPILEX BORDER 4X8 (GAUZE/BANDAGES/DRESSINGS) ×2 IMPLANT
ELECT REM PT RETURN 9FT ADLT (ELECTROSURGICAL) ×2
ELECTRODE REM PT RTRN 9FT ADLT (ELECTROSURGICAL) ×1 IMPLANT
GLOVE BIO SURGEON STRL SZ7.5 (GLOVE) ×8 IMPLANT
GLOVE BIOGEL PI IND STRL 7.5 (GLOVE) ×1 IMPLANT
GLOVE BIOGEL PI INDICATOR 7.5 (GLOVE) ×1
GOWN STRL REUS W/ TWL LRG LVL3 (GOWN DISPOSABLE) ×3 IMPLANT
GOWN STRL REUS W/TWL LRG LVL3 (GOWN DISPOSABLE) ×6
K-WIRE 2.0 (WIRE) ×2
K-WIRE FXSTD 280X2XNS SS (WIRE) ×1
KIT BASIN OR (CUSTOM PROCEDURE TRAY) ×2 IMPLANT
KIT TURNOVER KIT B (KITS) ×2 IMPLANT
KWIRE FXSTD 280X2XNS SS (WIRE) IMPLANT
NS IRRIG 1000ML POUR BTL (IV SOLUTION) ×2 IMPLANT
PACK TOTAL JOINT (CUSTOM PROCEDURE TRAY) ×2 IMPLANT
PAD ARMBOARD 7.5X6 YLW CONV (MISCELLANEOUS) ×4 IMPLANT
PAD CAST 4YDX4 CTTN HI CHSV (CAST SUPPLIES) ×1 IMPLANT
PADDING CAST COTTON 4X4 STRL (CAST SUPPLIES) ×2
PADDING CAST COTTON 6X4 STRL (CAST SUPPLIES) ×2 IMPLANT
PLATE DIST FEM 12H (Plate) ×1 IMPLANT
SCREW 5.0 60MM (Screw) ×1 IMPLANT
SCREW 5.0 70MM (Screw) ×2 IMPLANT
SCREW NCB 3.5X75X5X6.2XST (Screw) IMPLANT
SCREW NCB 4.0 32MM (Screw) ×1 IMPLANT
SCREW NCB 4.0MX34M (Screw) ×1 IMPLANT
SCREW NCB 4.0X36MM (Screw) ×1 IMPLANT
SCREW NCB 5.0X34MM (Screw) ×2 IMPLANT
SCREW NCB 5.0X75MM (Screw) ×6 IMPLANT
SPONGE LAP 18X18 X RAY DECT (DISPOSABLE) ×1 IMPLANT
STAPLER VISISTAT 35W (STAPLE) ×2 IMPLANT
SUCTION FRAZIER HANDLE 10FR (MISCELLANEOUS) ×1
SUCTION TUBE FRAZIER 10FR DISP (MISCELLANEOUS) ×1 IMPLANT
SUT ETHILON 3 0 PS 1 (SUTURE) ×4 IMPLANT
SUT VIC AB 0 CT1 27 (SUTURE) ×2
SUT VIC AB 0 CT1 27XBRD ANBCTR (SUTURE) IMPLANT
SUT VIC AB 1 CT1 27 (SUTURE)
SUT VIC AB 1 CT1 27XBRD ANBCTR (SUTURE) IMPLANT
SUT VIC AB 2-0 CT1 27 (SUTURE) ×4
SUT VIC AB 2-0 CT1 TAPERPNT 27 (SUTURE) ×2 IMPLANT
TOWEL GREEN STERILE (TOWEL DISPOSABLE) ×1 IMPLANT
TOWEL GREEN STERILE FF (TOWEL DISPOSABLE) ×1 IMPLANT
TRAY FOLEY MTR SLVR 16FR STAT (SET/KITS/TRAYS/PACK) ×1 IMPLANT
WATER STERILE IRR 1000ML POUR (IV SOLUTION) ×4 IMPLANT

## 2018-09-13 NOTE — Anesthesia Procedure Notes (Signed)

## 2018-09-13 NOTE — Progress Notes (Signed)
PROGRESS NOTE  No charge visit. Patient admitted earlier this morning  [name] Barker  TGG:[id number] DOB: 11-15-1921 DOA: 09/13/2018 PCP: Hoyt Koch, MD    Brief Narrative:  84 y.o. female with medical history significant for chronic low back pain, mild dementia, depression, and chronic normocytic anemia, now presenting to the emergency department with severe left leg pain and deformity after a fall at home.  Patient lives at home with her 62 year old husband in a unit adjacent to her daughter, has been in her usual state of good health with no recent complaints, and reports that she was getting some milk from her kitchen when a slipper came off and she fell onto her left side.  She experienced immediate and severe pain at the left hip and a deformity was noted.  She was unsure if she hit her head, but denies any loss of consciousness.  She prepares her own meals, does laundry for herself, her husband, children, and grandchildren in addition to other household chores.  She ambulates about the house using a walker.  CAD is noted in her problem list, but the patient denies this and has never experienced chest pain.  There has not been any recent illness, shortness of breath, cough, fevers, or chills.  She has occasional constipation, no recent diarrhea or vomiting.  No dysuria.  ED Course: Upon arrival to the ED, patient is found to be saturating well on room air, and with vitals otherwise stable.  EKG features a sinus rhythm with PVCs and LAD.  Chemistry panel is notable for potassium 3.3.  CBC features a normocytic anemia with hemoglobin 10.5.  Patient was treated with fentanyl and Dilaudid in the ED.  Orthopedic surgery was consulted by the ED physician.  Patient will be admitted to the medical-surgical unit for ongoing evaluation and management of acute fracture involving the mid to distal left femur.  Assessment & Plan:   Principal Problem:   Closed fracture of left femur,  initial encounter ([facility]) Active Problems:   ANEMIA-NOS   LOW BACK PAIN, CHRONIC   Depression  1. Left femur fracture  - Presents with severe left leg pain and deformity after a slip and fall at home  - Radiographs confirm acute displaced and slightly separated fracture involving mid to distal left femur  - Orthopedic surgery is consulted, planning for surgery this afternoon -Will need PT/OT after surgery. Possible SNF on discharge  2. Normocytic anemia  - Hgb is 10.5 on admission - No signs of acute blood loss  3. Depression  - Continue Celexa as tolerated  4. Hypokalemia  - Serum potassium is 3.3  - Receiving IVF with potassium  DVT prophylaxis: SCD's Code Status: Full Family Communication: Pt in room, family at bedside  Disposition Plan: Uncertain at this time  Consultants:   Orthopedic surgery  Procedures:     Antimicrobials: Anti-infectives (From admission, onward)   Start     Dose/Rate Route Frequency Ordered Stop   09/13/18 1530  ceFAZolin (ANCEF) IVPB 2g/100 mL premix     2 g 200 mL/hr over 30 Minutes Intravenous On call to O.R. 09/13/18 1505 09/14/18 0559       Subjective: No complaints this AM  Objective: Vitals:   09/13/18 0500 09/13/18 0542 09/13/18 1140 09/13/18 1334  BP: (!) 118/59 136/63 (!) 125/59 (!) 107/50  Pulse: (!) 57 62 62 (!) 59  Resp: 12 16 15 20   Temp:   98.5 F (36.9 C) 98.7 F (37.1 C)  TempSrc:  Oral Oral  SpO2: 98% 91% 99% 93%  Weight:      Height:        Intake/Output Summary (Last 24 hours) at 09/13/2018 1600 Last data filed at 09/13/2018 0867 Gross per 24 hour  Intake 1.25 ml  Output -  Net 1.25 ml   Filed Weights   09/13/18 0227  Weight: 54.4 kg    Examination:  General exam: Appears calm and comfortable  Respiratory system: Clear to auscultation. Respiratory effort normal. Cardiovascular system: S1 & S2 heard, RRR Gastrointestinal system: Abdomen is nondistended, soft and nontender. No organomegaly or  masses felt. Normal bowel sounds heard. Central nervous system: Alert and oriented. No focal neurological deficits. Extremities: Symmetric 5 x 5 power. Skin: No rashes, lesions Psychiatry: Judgement and insight appear normal. Mood & affect appropriate.   Data Reviewed: I have personally reviewed following labs and imaging studies  CBC: Recent Labs  Lab 09/13/18 0305  WBC 5.5  NEUTROABS 3.3  HGB 10.5*  HCT 33.4*  MCV 93.6  PLT 619   Basic Metabolic Panel: Recent Labs  Lab 09/13/18 0305  NA 136  K 3.3*  CL 99  CO2 27  GLUCOSE 115*  BUN 11  CREATININE 0.53  CALCIUM 8.5*   GFR: Estimated Creatinine Clearance: 31.7 mL/min (by C-G formula based on SCr of 0.53 mg/dL). Liver Function Tests: Recent Labs  Lab 09/13/18 0305  AST 24  ALT 9  ALKPHOS 54  BILITOT 0.3  PROT 6.2*  ALBUMIN 3.4*   No results for input(s): LIPASE, AMYLASE in the last 168 hours. No results for input(s): AMMONIA in the last 168 hours. Coagulation Profile: Recent Labs  Lab 09/13/18 0305  INR 1.05   Cardiac Enzymes: No results for input(s): CKTOTAL, CKMB, CKMBINDEX, TROPONINI in the last 168 hours. BNP (last 3 results) No results for input(s): PROBNP in the last 8760 hours. HbA1C: No results for input(s): HGBA1C in the last 72 hours. CBG: No results for input(s): GLUCAP in the last 168 hours. Lipid Profile: No results for input(s): CHOL, HDL, LDLCALC, TRIG, CHOLHDL, LDLDIRECT in the last 72 hours. Thyroid Function Tests: No results for input(s): TSH, T4TOTAL, FREET4, T3FREE, THYROIDAB in the last 72 hours. Anemia Panel: No results for input(s): VITAMINB12, FOLATE, FERRITIN, TIBC, IRON, RETICCTPCT in the last 72 hours. Sepsis Labs: No results for input(s): PROCALCITON, LATICACIDVEN in the last 168 hours.  Recent Results (from the past 240 hour(s))  Surgical pcr screen     Status: None   Collection Time: 09/13/18 10:44 AM  Result Value Ref Range Status   MRSA, PCR NEGATIVE NEGATIVE  Final   Staphylococcus aureus NEGATIVE NEGATIVE Final    Comment: (NOTE) The Xpert SA Assay (FDA approved for NASAL specimens in patients 77 years of age and older), is one component of a comprehensive surveillance program. It is not intended to diagnose infection nor to guide or monitor treatment. Performed at [facility], [place] [address] [place], [place] [address]      Radiology Studies: Dg Chest 1 View  Result Date: 09/13/2018 CLINICAL DATA:  Fall with femur deformity EXAM: CHEST  1 VIEW COMPARISON:  05/19/2016 FINDINGS: No acute opacity or pleural effusion. Cardiomegaly with aortic atherosclerosis. No pneumothorax. Chronic deformity distal right clavicle. IMPRESSION: No active disease.  Cardiomegaly. Electronically Signed   By: Donavan Foil M.D.   On: 09/13/2018 03:46   Ct Head Wo Contrast  Result Date: 09/13/2018 CLINICAL DATA:  Fall EXAM: CT HEAD WITHOUT CONTRAST TECHNIQUE: Contiguous axial  images were obtained from the base of the skull through the vertex without intravenous contrast. COMPARISON:  Head CT 02/20/2013 FINDINGS: Brain: No acute territorial infarction, hemorrhage or intracranial mass. Atrophy and moderate small vessel ischemic changes of the white matter. Stable ventricle size. Vascular: No hyperdense vessels.  Carotid vascular calcification. Skull: No fracture Sinuses/Orbits: No acute finding. Other: None IMPRESSION: 1. No CT evidence for acute intracranial abnormality. 2. Atrophy and small vessel ischemic changes of the white matter Electronically Signed   By: Donavan Foil M.D.   On: 09/13/2018 03:32   Dg Hip Unilat W Or Wo Pelvis 2-3 Views Left  Result Date: 09/13/2018 CLINICAL DATA:  Fall with left femur deformity EXAM: DG HIP (WITH OR WITHOUT PELVIS) 2-3V LEFT COMPARISON:  None. FINDINGS: Pubic symphysis and rami appear intact. SI joint degenerative changes. Right femoral head projects in joint. Limited evaluation of left femoral neck due to superimposition of  the trochanter. No dislocation evident. IMPRESSION: Limited evaluation of left femoral neck due to superimposition of the trochanter. No definite acute osseous abnormality is seen. Electronically Signed   By: Donavan Foil M.D.   On: 09/13/2018 03:46   Dg Femur Min 2 Views Left  Result Date: 09/13/2018 CLINICAL DATA:  Fall with femur deformity EXAM: LEFT FEMUR 2 VIEWS COMPARISON:  None. FINDINGS: Previous left knee replacement. Acute oblique fracture involving the mid to distal shaft of the femur with extension of fracture lucency to the level of the femoral prosthesis. About 1/2 bone with posterior and 1/3 bone with lateral displacement of distal fracture fragment. Separation of the fracture fragments by about 13 mm. Vascular calcifications in the soft tissues. IMPRESSION: Prior left knee replacement. Acute displaced and slightly separated fracture involving the mid to distal femur, extending to the level of the femoral prosthesis. Electronically Signed   By: Donavan Foil M.D.   On: 09/13/2018 03:48    Scheduled Meds: . [MAR Hold] acidophilus  1 capsule Oral Daily  . [MAR Hold] cholecalciferol  1,000 Units Oral Daily  . [MAR Hold] citalopram  10 mg Oral Daily  . [MAR Hold] feeding supplement (ENSURE ENLIVE)  237 mL Oral BID BM  . [MAR Hold] Influenza vac split quadrivalent PF  0.5 mL Intramuscular Tomorrow-1000  . [MAR Hold] multivitamin with minerals  1 tablet Oral Daily  . [MAR Hold] omega-3 acid ethyl esters  1 g Oral Daily  . [MAR Hold] senna-docusate  1 tablet Oral QPM  . [MAR Hold] sodium chloride   Both Eyes Daily   Continuous Infusions: . 0.9 % NaCl with KCl 20 mEq / L 75 mL/hr (09/13/18 0631)  .  ceFAZolin (ANCEF) IV    . lactated ringers 10 mL/hr at 09/13/18 1536     LOS: 0 days   Marylu Lund, MD [facility] Pager On Amion  If 7PM-7AM, please contact night-coverage 09/13/2018, 4:00 PM

## 2018-09-13 NOTE — ED Provider Notes (Signed)
[facility] EMERGENCY DEPARTMENT Provider Note   CSN: [id number] Arrival date & time: 09/13/18  0210     History   Chief Complaint Chief Complaint  Patient presents with  . Fall  . Hip Pain    HPI [name] Barker is a 84 y.o. female.  Patient from home after left hip and leg pain after a fall.  States she was trying to walk to get some coffee when she tripped on her slipper and fell onto her left side.  Does not think she hit her head.  Denies any neck or back pain.  Denies any headache.  She received IV ketamine from EMS for pain.  She denies any blood thinner use.  She denies any dizziness or lightheadedness preceding the fall.  No chest pain or shortness of breath.  She does not know what her medical history is or what medications that she takes.  She does not think that she lost consciousness.  The history is provided by the patient and the EMS personnel.  Fall  Pertinent negatives include no chest pain, no abdominal pain and no shortness of breath.  Hip Pain  Pertinent negatives include no chest pain, no abdominal pain and no shortness of breath.    Past Medical History:  Diagnosis Date  . Allergic rhinitis   . ANEMIA-NOS 11/17/2006  . Colon cancer ([place]) 08/19/2007   1994 T2, 2008 T3, N1  . DIVERTICULOSIS-COLON 05/30/2010  . GERD 11/17/2006  . Hemorrhoids   . HYPERLIPIDEMIA 11/17/2006  . LOW BACK PAIN, CHRONIC 02/04/2010  . Osteopenia 11/17/2006  . Small bowel obstruction ([place])   . TIA (transient ischemic attack)     Patient Active Problem List   Diagnosis Date Noted  . Bilateral impacted cerumen 10/30/2017  . Bronchitis 09/21/2017  . Irregular heartbeat 09/21/2017  . Mild dementia 08/10/2017  . Bilateral hearing loss 08/07/2016  . Depression 08/07/2016  . Pre-syncope 02/01/2016  . Vitamin D deficiency 11/04/2015  . Fatigue 11/04/2015  . Routine general medical examination at a health care facility 06/08/2015  . TIA (transient ischemic  attack) 04/04/2012  . CAD 06/21/2010  . LOW BACK PAIN, CHRONIC 02/04/2010  . ADENOCARCINOMA, COLON 08/19/2007  . HYPERLIPIDEMIA 11/17/2006  . ANEMIA-NOS 11/17/2006  . GERD 11/17/2006  . OSTEOPENIA 11/17/2006    Past Surgical History:  Procedure Laterality Date  . ABDOMINAL HYSTERECTOMY    . APPENDECTOMY    . BACK SURGERY  2007  . BELPHAROPTOSIS REPAIR    . CATARACT EXTRACTION    . HEMICOLECTOMY  1994   Right  . JOINT REPLACEMENT    . KNEE ARTHROSCOPY    . PARTIAL COLECTOMY  2008  . ROTATOR CUFF REPAIR    . SHOULDER SURGERY    . VAGINAL PROLAPSE REPAIR       OB History   None      Home Medications    Prior to Admission medications   Medication Sig Start Date End Date Taking? Authorizing Provider  albuterol (PROAIR HFA) 108 (90 Base) MCG/ACT inhaler INHALE 2 PUFFS INTO THE LUNGS 2 (TWO) TIMES DAILY AS NEEDED. FOR WHEEZING 05/19/16   Burnard Hawthorne, FNP  aspirin 81 MG tablet Take 81 mg by mouth daily.     [provider]  Cholecalciferol (VITAMIN D) 2000 UNITS tablet Take by mouth daily. 5,000 IU daily    [provider]  citalopram (CELEXA) 10 MG tablet Take 1 tablet (10 mg total) by mouth daily. 06/22/18   Sharlet Salina,  Real Cons, MD  clindamycin (CLEOCIN) 150 MG capsule TAKE 4 CAPSULES 1 HOUR BEFORE APPOINTMENT 05/27/17   Hoyt Koch, MD  Fish Oil-Cholecalciferol (FISH OIL + D3 PO) Take by mouth.    [provider]  glycerin adult (GLYCERIN ADULT) 2 G SUPP Place 1 suppository rectally daily.     [provider]  HYDROcodone-acetaminophen (NORCO/VICODIN) 5-325 MG tablet TAKE 1/2 TO 1 TABLET BY MOUTH EVERY 4 HOURS WHILE AWAKE AS NEEDED 05/05/17   [provider]  Probiotic Product (ALIGN) 4 MG CAPS Take 1 capsule by mouth daily.     [provider]  senna-docusate (SENOKOT-S) 8.6-50 MG per tablet Take 1 tablet by mouth every evening.    [provider]  sodium chloride (MURO 128) 5 % ophthalmic ointment  Place 1 drop into both eyes daily.    [provider]  VOLTAREN 1 % GEL APPLY TO AFFECTED AREA EVERY DAY AS NEEDED BACK PAIN    Swords, Darrick Penna, MD    Family History Family History  Problem Relation Age of Onset  . Lung cancer Sister   . Colon cancer Sister 47  . Kidney cancer Sister     Social History Social History   Tobacco Use  . Smoking status: Never Smoker  . Smokeless tobacco: Never Used  Substance Use Topics  . Alcohol use: No  . Drug use: No     Allergies   Flexeril [cyclobenzaprine]; Methenamine mandelate; Morphine sulfate; Penicillins; Promethazine hcl; and Statins   Review of Systems Review of Systems  Constitutional: Negative for activity change and appetite change.  HENT: Negative for congestion and rhinorrhea.   Respiratory: Negative for cough, chest tightness and shortness of breath.   Cardiovascular: Negative for chest pain.  Gastrointestinal: Negative for abdominal pain, nausea and vomiting.  Genitourinary: Negative for dysuria and hematuria.  Musculoskeletal: Positive for arthralgias and myalgias. Negative for back pain.  Neurological: Negative for dizziness, weakness, light-headedness and numbness.   all other systems are negative except as noted in the HPI and PMH.     Physical Exam Updated Vital Signs BP 136/63 (BP Location: Right Arm)   Pulse 62   Resp 16   Ht 4\' 11"  (1.499 m)   Wt 54.4 kg   SpO2 91%   BMI 24.24 kg/m   Physical Exam  Constitutional: She is oriented to person, place, and time. She appears well-developed and well-nourished. No distress.  HENT:  Head: Normocephalic and atraumatic.  Mouth/Throat: Oropharynx is clear and moist. No oropharyngeal exudate.  Eyes: Pupils are equal, round, and reactive to light. Conjunctivae and EOM are normal.  Neck: Normal range of motion. Neck supple.  No C spine tenderness  Cardiovascular: Normal rate, regular rhythm, normal heart sounds and intact distal pulses.  No murmur  heard. Pulmonary/Chest: Effort normal and breath sounds normal. No respiratory distress. She exhibits no tenderness.  Abdominal: Soft. There is no tenderness. There is no rebound and no guarding.  Musculoskeletal: Normal range of motion. She exhibits no edema or tenderness.  No T or L spine tenderness Shortening and external rotation of left lower extremity.  There is deformity and pain to palpation of the left mid and distal thigh Intact DP pulse. Compartments are soft  Neurological: She is alert and oriented to person, place, and time. No cranial nerve deficit. She exhibits normal muscle tone. Coordination normal.   5/5 strength throughout. CN 2-12 intact.Equal grip strength.   Skin: Skin is warm.  Psychiatric: She has a normal  mood and affect. Her behavior is normal.  Nursing note and vitals reviewed.    ED Treatments / Results  Labs (all labs ordered are listed, but only abnormal results are displayed) Labs Reviewed  CBC WITH DIFFERENTIAL/PLATELET - Abnormal; Notable for the following components:      Result Value   RBC 3.57 (*)    Hemoglobin 10.5 (*)    HCT 33.4 (*)    All other components within normal limits  COMPREHENSIVE METABOLIC PANEL - Abnormal; Notable for the following components:   Potassium 3.3 (*)    Glucose, Bld 115 (*)    Calcium 8.5 (*)    Total Protein 6.2 (*)    Albumin 3.4 (*)    All other components within normal limits  PROTIME-INR  TYPE AND SCREEN    EKG EKG Interpretation  Date/Time:  Monday September 13 2018 02:36:00 EDT Ventricular Rate:  61 PR Interval:    QRS Duration: 101 QT Interval:  437 QTC Calculation: 441 R Axis:   -36 Text Interpretation:  Sinus rhythm Ventricular premature complex Left axis deviation Abnormal R-wave progression, early transition No significant change was found Confirmed by Ezequiel Essex [phone]) on 09/13/2018 3:07:02 AM   Radiology Dg Chest 1 View  Result Date: 09/13/2018 CLINICAL DATA:  Fall with femur  deformity EXAM: CHEST  1 VIEW COMPARISON:  05/19/2016 FINDINGS: No acute opacity or pleural effusion. Cardiomegaly with aortic atherosclerosis. No pneumothorax. Chronic deformity distal right clavicle. IMPRESSION: No active disease.  Cardiomegaly. Electronically Signed   By: Donavan Foil M.D.   On: 09/13/2018 03:46   Ct Head Wo Contrast  Result Date: 09/13/2018 CLINICAL DATA:  Fall EXAM: CT HEAD WITHOUT CONTRAST TECHNIQUE: Contiguous axial images were obtained from the base of the skull through the vertex without intravenous contrast. COMPARISON:  Head CT 02/20/2013 FINDINGS: Brain: No acute territorial infarction, hemorrhage or intracranial mass. Atrophy and moderate small vessel ischemic changes of the white matter. Stable ventricle size. Vascular: No hyperdense vessels.  Carotid vascular calcification. Skull: No fracture Sinuses/Orbits: No acute finding. Other: None IMPRESSION: 1. No CT evidence for acute intracranial abnormality. 2. Atrophy and small vessel ischemic changes of the white matter Electronically Signed   By: Donavan Foil M.D.   On: 09/13/2018 03:32   Dg Hip Unilat W Or Wo Pelvis 2-3 Views Left  Result Date: 09/13/2018 CLINICAL DATA:  Fall with left femur deformity EXAM: DG HIP (WITH OR WITHOUT PELVIS) 2-3V LEFT COMPARISON:  None. FINDINGS: Pubic symphysis and rami appear intact. SI joint degenerative changes. Right femoral head projects in joint. Limited evaluation of left femoral neck due to superimposition of the trochanter. No dislocation evident. IMPRESSION: Limited evaluation of left femoral neck due to superimposition of the trochanter. No definite acute osseous abnormality is seen. Electronically Signed   By: Donavan Foil M.D.   On: 09/13/2018 03:46   Dg Femur Min 2 Views Left  Result Date: 09/13/2018 CLINICAL DATA:  Fall with femur deformity EXAM: LEFT FEMUR 2 VIEWS COMPARISON:  None. FINDINGS: Previous left knee replacement. Acute oblique fracture involving the mid to distal  shaft of the femur with extension of fracture lucency to the level of the femoral prosthesis. About 1/2 bone with posterior and 1/3 bone with lateral displacement of distal fracture fragment. Separation of the fracture fragments by about 13 mm. Vascular calcifications in the soft tissues. IMPRESSION: Prior left knee replacement. Acute displaced and slightly separated fracture involving the mid to distal femur, extending to the level of the femoral  prosthesis. Electronically Signed   By: Donavan Foil M.D.   On: 09/13/2018 03:48    Procedures Procedures (including critical care time)  Medications Ordered in ED Medications - No data to display   Initial Impression / Assessment and Plan / ED Course  I have reviewed the triage vital signs and the nursing notes.  Pertinent labs & imaging results that were available during my care of the patient were reviewed by me and considered in my medical decision making (see chart for details).    Mechanical fall with suspected left hip or femur fracture.  No head injury.  CT head negative. EKG sinus rhythm  X-ray confirms midshaft femur fracture that extends to the knee prosthesis.  Patient's daughter thinks her knee surgery was done by [facility] orthopedics.  Discussed with Dr. Doreatha Martin on-call for orthopedics.  He will see patient and arrange for surgical repair later today.  Does not recommend any traction or splinting.  Labs show anemia that appears to be near to baseline.  Admission to the medical service discussed with Dr. Myna Hidalgo.  Final Clinical Impressions(s) / ED Diagnoses   Final diagnoses:  Closed fracture of left femur, unspecified fracture morphology, initial encounter [facility])    ED Discharge Orders    None       Ezequiel Essex, MD 09/13/18 [phone]

## 2018-09-13 NOTE — H&P (Signed)
History and Physical    [name] Barker YWV:[id number] DOB: Jan 09, 1921 DOA: 09/13/2018  PCP: Hoyt Koch, MD   Patient coming from: Home   Chief Complaint: Fall with left leg pain  HPI: [name] Barker is a 84 y.o. female with medical history significant for chronic low back pain, mild dementia, depression, and chronic normocytic anemia, now presenting to the emergency department with severe left leg pain and deformity after a fall at home.  Patient lives at home with her 66 year old husband in a unit adjacent to her daughter, has been in her usual state of good health with no recent complaints, and reports that she was getting some milk from her kitchen when a slipper came off and she fell onto her left side.  She experienced immediate and severe pain at the left hip and a deformity was noted.  She was unsure if she hit her head, but denies any loss of consciousness.  She prepares her own meals, does laundry for herself, her husband, children, and grandchildren in addition to other household chores.  She ambulates about the house using a walker.  CAD is noted in her problem list, but the patient denies this and has never experienced chest pain.  There has not been any recent illness, shortness of breath, cough, fevers, or chills.  She has occasional constipation, no recent diarrhea or vomiting.  No dysuria.  ED Course: Upon arrival to the ED, patient is found to be saturating well on room air, and with vitals otherwise stable.  EKG features a sinus rhythm with PVCs and LAD.  Chemistry panel is notable for potassium 3.3.  CBC features a normocytic anemia with hemoglobin 10.5.  Patient was treated with fentanyl and Dilaudid in the ED.  Orthopedic surgery was consulted by the ED physician.  Patient will be admitted to the medical-surgical unit for ongoing evaluation and management of acute fracture involving the mid to distal left femur.  Review of Systems:  All other systems reviewed  and apart from HPI, are negative.  Past Medical History:  Diagnosis Date  . Allergic rhinitis   . ANEMIA-NOS 11/17/2006  . Colon cancer ([place]) 08/19/2007   1994 T2, 2008 T3, N1  . DIVERTICULOSIS-COLON 05/30/2010  . GERD 11/17/2006  . Hemorrhoids   . HYPERLIPIDEMIA 11/17/2006  . LOW BACK PAIN, CHRONIC 02/04/2010  . Osteopenia 11/17/2006  . Small bowel obstruction ([place])   . TIA (transient ischemic attack)     Past Surgical History:  Procedure Laterality Date  . ABDOMINAL HYSTERECTOMY    . APPENDECTOMY    . BACK SURGERY  2007  . BELPHAROPTOSIS REPAIR    . CATARACT EXTRACTION    . HEMICOLECTOMY  1994   Right  . JOINT REPLACEMENT    . KNEE ARTHROSCOPY    . PARTIAL COLECTOMY  2008  . ROTATOR CUFF REPAIR    . SHOULDER SURGERY    . VAGINAL PROLAPSE REPAIR       reports that she has never smoked. She has never used smokeless tobacco. She reports that she does not drink alcohol or use drugs.  Allergies  Allergen Reactions  . Flexeril [Cyclobenzaprine]     nightmares  . Methenamine Mandelate     REACTION: burning sensation  . Morphine Sulfate     REACTION: blister  . Penicillins     REACTION: urticaria (hives)  . Promethazine Hcl     REACTION: hallucinations  . Statins Other (See Comments)    Muscle pain  Family History  Problem Relation Age of Onset  . Lung cancer Sister   . Colon cancer Sister 24  . Kidney cancer Sister      Prior to Admission medications   Medication Sig Start Date End Date Taking? Authorizing Provider  albuterol (PROAIR HFA) 108 (90 Base) MCG/ACT inhaler INHALE 2 PUFFS INTO THE LUNGS 2 (TWO) TIMES DAILY AS NEEDED. FOR WHEEZING 05/19/16   Burnard Hawthorne, FNP  aspirin 81 MG tablet Take 81 mg by mouth daily.     [provider]  Cholecalciferol (VITAMIN D) 2000 UNITS tablet Take by mouth daily. 5,000 IU daily    [provider]  citalopram (CELEXA) 10 MG tablet Take 1 tablet (10 mg total) by mouth daily. 06/22/18   Hoyt Koch, MD  clindamycin (CLEOCIN) 150 MG capsule TAKE 4 CAPSULES 1 HOUR BEFORE APPOINTMENT 05/27/17   Hoyt Koch, MD  Fish Oil-Cholecalciferol (FISH OIL + D3 PO) Take by mouth.    [provider]  glycerin adult (GLYCERIN ADULT) 2 G SUPP Place 1 suppository rectally daily.     [provider]  HYDROcodone-acetaminophen (NORCO/VICODIN) 5-325 MG tablet TAKE 1/2 TO 1 TABLET BY MOUTH EVERY 4 HOURS WHILE AWAKE AS NEEDED 05/05/17   [provider]  Probiotic Product (ALIGN) 4 MG CAPS Take 1 capsule by mouth daily.     [provider]  senna-docusate (SENOKOT-S) 8.6-50 MG per tablet Take 1 tablet by mouth every evening.    [provider]  sodium chloride (MURO 128) 5 % ophthalmic ointment Place 1 drop into both eyes daily.    [provider]  VOLTAREN 1 % GEL APPLY TO AFFECTED AREA EVERY DAY AS NEEDED BACK PAIN    Lisabeth Pick, MD    Physical Exam: Vitals:   09/13/18 0348 09/13/18 0400 09/13/18 0415 09/13/18 0430  BP: 122/63 131/67 132/69 (!) 148/64  Pulse: 61 60 (!) 59 (!) 58  Resp: (!) 23 16 18 14   SpO2: 97% 90% 97% 93%  Weight:      Height:         Constitutional: NAD, in apparent discomfort.  Eyes: PERTLA, lids and conjunctivae normal ENMT: Mucous membranes are moist. Posterior pharynx clear of any exudate or lesions.   Neck: normal, supple, no masses, no thyromegaly Respiratory: clear to auscultation bilaterally, no wheezing, no crackles. Normal respiratory effort.    Cardiovascular: S1 & S2 heard, regular rate and rhythm. No extremity edema.   Abdomen: No distension, no tenderness, soft. Bowel sounds normal.  Musculoskeletal: no clubbing / cyanosis. Left thigh exquisitely tender with shortening of the LLE; neurovascularly intact.  Skin: no significant rashes, lesions, ulcers. Warm, dry, well-perfused. Neurologic: No facial asymmetry. Hearing deficit. Sensation intact. Strength 5/5 in all 4 limbs.  Psychiatric:  Alert and oriented to person, place and situation. Pleasant and cooperative.    Labs on Admission: I have personally reviewed following labs and imaging studies  CBC: Recent Labs  Lab 09/13/18 0305  WBC 5.5  NEUTROABS 3.3  HGB 10.5*  HCT 33.4*  MCV 93.6  PLT 093   Basic Metabolic Panel: Recent Labs  Lab 09/13/18 0305  NA 136  K 3.3*  CL 99  CO2 27  GLUCOSE 115*  BUN 11  CREATININE 0.53  CALCIUM 8.5*   GFR: Estimated Creatinine Clearance: 31.7 mL/min (by C-G formula based on SCr of 0.53 mg/dL). Liver Function Tests: Recent Labs  Lab 09/13/18 0305  AST 24  ALT 9  ALKPHOS  54  BILITOT 0.3  PROT 6.2*  ALBUMIN 3.4*   No results for input(s): LIPASE, AMYLASE in the last 168 hours. No results for input(s): AMMONIA in the last 168 hours. Coagulation Profile: Recent Labs  Lab 09/13/18 0305  INR 1.05   Cardiac Enzymes: No results for input(s): CKTOTAL, CKMB, CKMBINDEX, TROPONINI in the last 168 hours. BNP (last 3 results) No results for input(s): PROBNP in the last 8760 hours. HbA1C: No results for input(s): HGBA1C in the last 72 hours. CBG: No results for input(s): GLUCAP in the last 168 hours. Lipid Profile: No results for input(s): CHOL, HDL, LDLCALC, TRIG, CHOLHDL, LDLDIRECT in the last 72 hours. Thyroid Function Tests: No results for input(s): TSH, T4TOTAL, FREET4, T3FREE, THYROIDAB in the last 72 hours. Anemia Panel: No results for input(s): VITAMINB12, FOLATE, FERRITIN, TIBC, IRON, RETICCTPCT in the last 72 hours. Urine analysis:    Component Value Date/Time   COLORURINE yellow 02/09/2008 1046   APPEARANCEUR Clear 02/09/2008 1046   LABSPEC 1.015 03/29/2009 1440   PHURINE 6.0 03/29/2009 1440   PHURINE 7.5 02/09/2008 1046   HGBUR Negative 03/29/2009 1440   HGBUR negative 02/09/2008 1046   BILIRUBINUR Negative 03/29/2009 1440   KETONESUR Negative 03/29/2009 1440   PROTEINUR Negative 03/29/2009 1440   UROBILINOGEN 0.2 02/09/2008 1046   NITRITE  Negative 03/29/2009 1440   NITRITE negative 02/09/2008 1046   LEUKOCYTESUR Trace 03/29/2009 1440   Sepsis Labs: @LABRCNTIP (procalcitonin:4,lacticidven:4) )No results found for this or any previous visit (from the past 240 hour(s)).   Radiological Exams on Admission: Dg Chest 1 View  Result Date: 09/13/2018 CLINICAL DATA:  Fall with femur deformity EXAM: CHEST  1 VIEW COMPARISON:  05/19/2016 FINDINGS: No acute opacity or pleural effusion. Cardiomegaly with aortic atherosclerosis. No pneumothorax. Chronic deformity distal right clavicle. IMPRESSION: No active disease.  Cardiomegaly. Electronically Signed   By: Donavan Foil M.D.   On: 09/13/2018 03:46   Ct Head Wo Contrast  Result Date: 09/13/2018 CLINICAL DATA:  Fall EXAM: CT HEAD WITHOUT CONTRAST TECHNIQUE: Contiguous axial images were obtained from the base of the skull through the vertex without intravenous contrast. COMPARISON:  Head CT 02/20/2013 FINDINGS: Brain: No acute territorial infarction, hemorrhage or intracranial mass. Atrophy and moderate small vessel ischemic changes of the white matter. Stable ventricle size. Vascular: No hyperdense vessels.  Carotid vascular calcification. Skull: No fracture Sinuses/Orbits: No acute finding. Other: None IMPRESSION: 1. No CT evidence for acute intracranial abnormality. 2. Atrophy and small vessel ischemic changes of the white matter Electronically Signed   By: Donavan Foil M.D.   On: 09/13/2018 03:32   Dg Hip Unilat W Or Wo Pelvis 2-3 Views Left  Result Date: 09/13/2018 CLINICAL DATA:  Fall with left femur deformity EXAM: DG HIP (WITH OR WITHOUT PELVIS) 2-3V LEFT COMPARISON:  None. FINDINGS: Pubic symphysis and rami appear intact. SI joint degenerative changes. Right femoral head projects in joint. Limited evaluation of left femoral neck due to superimposition of the trochanter. No dislocation evident. IMPRESSION: Limited evaluation of left femoral neck due to superimposition of the trochanter. No  definite acute osseous abnormality is seen. Electronically Signed   By: Donavan Foil M.D.   On: 09/13/2018 03:46   Dg Femur Min 2 Views Left  Result Date: 09/13/2018 CLINICAL DATA:  Fall with femur deformity EXAM: LEFT FEMUR 2 VIEWS COMPARISON:  None. FINDINGS: Previous left knee replacement. Acute oblique fracture involving the mid to distal shaft of the femur with extension of fracture lucency to the level of  the femoral prosthesis. About 1/2 bone with posterior and 1/3 bone with lateral displacement of distal fracture fragment. Separation of the fracture fragments by about 13 mm. Vascular calcifications in the soft tissues. IMPRESSION: Prior left knee replacement. Acute displaced and slightly separated fracture involving the mid to distal femur, extending to the level of the femoral prosthesis. Electronically Signed   By: Donavan Foil M.D.   On: 09/13/2018 03:48    EKG: Independently reviewed. Sinus rhythm, PVC, LAD.   Assessment/Plan   1. Left femur fracture  - Presents with severe left leg pain and deformity after a slip and fall at home  - Radiographs confirm acute displaced and slightly separated fracture involving mid to distal left femur  - Orthopedic surgery is consulting and much appreciated  - Based on the available data, [name] Barker presents an estimated 0.5% risk for perioperative MI or cardiac arrest per Melburn Hake al  - Keep NPO, hold ASA, continue supportive care with gentle IVF hydration and pain-control    2. Normocytic anemia  - Hgb is 10.5 on admission - No apparent bleeding  - Type and screen in preparation for surgery    3. Depression  - Continue Celexa   4. Hypokalemia  - Serum potassium is 3.3  - KCl added to IVF, 1 g mag given empirically    DVT prophylaxis: SCD's  Code Status: Full  Family Communication: Daughter updated at bedside Consults called: Orthopedic surgery  Admission status: Inpatient     Vianne Bulls, MD [facility] Pager  [phone]  If 7PM-7AM, please contact night-coverage [email or web address] Password [email or web address]  09/13/2018, 5:05 AM

## 2018-09-13 NOTE — Op Note (Signed)
OrthopaedicSurgeryOperativeNote (LFY:[id number]) Date of Surgery: 09/13/2018  Admit Date: 09/13/2018   Diagnoses: Pre-Op Diagnoses: Left periprosthetic femur fracture   Post-Op Diagnosis: Same  Procedures: CPT 27511-Open reduction internal fixation of left distal femur fracture  Surgeons: Primary: Shona Needles, MD   Location:[facility] OR ROOM 07   AnesthesiaGeneral   Antibiotics:Ancef 2g preop   Tourniquettime:None used  ENIDPOEUMPNTIRWERX:540 mL   Complications:None  Specimens:None  Implants: Implant Name Type Inv. Item Serial No. Manufacturer Lot No. LRB No. Used Action  PLATE DIST FEM 08Q - [id number] Plate PLATE DIST FEM 67T  [employer](ORTH,TRAU,BIO,SG)  Left 1 Implanted  SCREW NCB 4.0 32MM - [id number] Screw SCREW NCB 4.0 32MM  [employer](ORTH,TRAU,BIO,SG)  Left 1 Implanted  SCREW NCB 4.0MX34M - [id number] Screw SCREW NCB 4.0MX34M  [employer](ORTH,TRAU,BIO,SG)  Left 1 Implanted  SCREW NCB 4.0X36MM - [id number] Screw SCREW NCB 4.0X36MM  [employer](ORTH,TRAU,BIO,SG)  Left 1 Implanted  SCREW NCB 5.0X34MM - [id number] Screw SCREW NCB 5.0X34MM  [employer](ORTH,TRAU,BIO,SG)  Left 2 Implanted  SCREW 5.0 60MM - [id number] Screw SCREW 5.0 60MM  [employer](ORTH,TRAU,BIO,SG)  Left 1 Implanted  SCREW 5.0 70MM - [id number] Screw SCREW 5.0 70MM  [employer](ORTH,TRAU,BIO,SG)  Left 2 Implanted  SCREW NCB 5.0X75MM - [id number] Screw SCREW NCB 5.0X75MM  [employer](ORTH,TRAU,BIO,SG)  Left 3 Implanted  CAP LOCK NCB - [id number] Cap CAP LOCK NCB  [employer](ORTH,TRAU,BIO,SG)  Left 8 Implanted    IndicationsforSurgery: 84 year old female with history of low back pain and depression with a fall and a displaced left femur fracture. Due to her ambulatory capabilities and the displacement of the fracture I recommend proceeding with open reduction internal fixation.  Risks and benefits were discussed with the patient and the family. Risks discussed included bleeding requiring  blood transfusion, bleeding causing a hematoma, infection, malunion, nonunion, damage to surrounding nerves and blood vessels, pain, hardware prominence or irritation, hardware failure, stiffness, post-traumatic arthritis, DVT/PE, compartment syndrome, and even death.  She agrees to proceed with surgery and consent was obtained.  Operative Findings: Open reduction internal fixation left distal femur fracture using provisional fixation with 4.0 millimeter screws into visualization plating using [employer] NCB 12 hole distal femoral locking plate.  Procedure: The patient was identified in the preoperative holding area. Consent was confirmed with the patient and their family and all questions were answered. The operative extremity was marked after confirmation with the patient. The patientwas then brought back to the operating room by our anesthesia colleagues. The patient was placed under general anesthesia and was carefully transferred over to a radiolucent flattop table. They were secured to the bed and all bony prominences were padded.  A bump was placed under the operative hip. The operative extremity was then prepped and draped in usual sterile fashion. A timeout was performed to verify the patient, the procedure and extremity. Preoperative antibiotics were dosed.  Fluoroscopy was used to identify the fracture. A lateral approach was made to the distal femur. It was taken down through the skin and the IT band was incised in line with the incision. The distal portion of the vastus lateralis was reflected off the IT band and the distal articular block of the lateral femoral condyle was exposed.  I extended my incision proximally to be able to identify the fracture.  I cleaned the clot out and proceeded to clamp the fracture where he had excellent apposition and compression of the fracture.  I then proceeded to place anterior to posterior 4.0 mm positional screws to hold  the reduction while I placed my  plate.  I chose a 12-hole plate. The aiming arm was attached to the plate and slide submuscularly under the vastus to the proximal femur. The distal portion of the plate was placed flush against the lateral cortex of the distal articular block. A 3.51mm drill bit was used to position the proximal portion of the plate. A perfect lateral was obtained to show appropriate positioning of the plate.  The distal segment was drilled and 5.81mm cortical screws were placed in the distal fracture segment. A total of 3 screws were placed initially. A 5.0 mm screw was placed percutaneously in the shaft to reduce the coronal alignment. A total of three shaft screws were placed. Locking caps were placed in the shaft screws except for the most proximal screw to prevent a stress riser. The targeting guide was removed. Three more screws were placed in the distal segment. Fluoroscopy was used to confirm adequate reduction of the fracture and appropriate position of the plate and screws.  The incisions were irrigated with normal saline. A gram of vancomycin powder was placed in the incision around the plate. The IT band was closed with 0-vicryl. The skin was closed with 2-vicryl, 3-0 nylon. The incisions were dressed with a Mepilex dressings and sterile cast padding and the leg was wrapped with a ACE wrap. The patient was then transferred to the regular floor bed and taken to the PACU in stable condition.  Post Op Plan/Instructions: Patient will be weightbearing as tolerated to the left lower extremity.  She will receive postoperative Ancef.  She will be started on Lovenox for DVT prophylaxis postoperative day 1.  We will mobilize her with physical and occupational therapy.  I was present and performed the entire surgery.  Katha Hamming, MD [facility]

## 2018-09-13 NOTE — Consult Note (Signed)
Orthopaedic Trauma Service (OTS) Consult   Patient ID: [name] Barker MRN: [id number] DOB/AGE: 1921/10/26 84 y.o.  Reason for Consult:Left femur fracture Referring Physician: Dr. Ezequiel Essex, MD [facility]  HPI: [name] Barker is an 84 y.o. female who is being seen in consultation at the request of Dr. Wyvonnia Dusky for evaluation of left femur fracture.  The patient has history of low back pain, depression and mild dementia.  She lives with her 38 year old husband adjacent to her daughter.  She has been in good health.  She was getting milk from her kitchen when she slipped and fell on her left side.  She had immediate pain and deformity.  She was unable to get up.  EMS was called.  She is very independent and active.  She ambulates around the house using a walker.  I was consulted for treatment she was admitted to the hospitalist service.  Daughter is at bedside during evaluation.  The patient is a little disoriented.  However she denies any injuries or pain anywhere else other than her left leg.  Past Medical History:  Diagnosis Date  . Allergic rhinitis   . ANEMIA-NOS 11/17/2006  . Colon cancer ([place]) 08/19/2007   1994 T2, 2008 T3, N1  . DIVERTICULOSIS-COLON 05/30/2010  . GERD 11/17/2006  . Hemorrhoids   . HYPERLIPIDEMIA 11/17/2006  . LOW BACK PAIN, CHRONIC 02/04/2010  . Osteopenia 11/17/2006  . Small bowel obstruction ([place])   . TIA (transient ischemic attack)     Past Surgical History:  Procedure Laterality Date  . ABDOMINAL HYSTERECTOMY    . APPENDECTOMY    . BACK SURGERY  2007  . BELPHAROPTOSIS REPAIR    . CATARACT EXTRACTION    . HEMICOLECTOMY  1994   Right  . JOINT REPLACEMENT    . KNEE ARTHROSCOPY    . PARTIAL COLECTOMY  2008  . ROTATOR CUFF REPAIR    . SHOULDER SURGERY    . VAGINAL PROLAPSE REPAIR      Family History  Problem Relation Age of Onset  . Lung cancer Sister   . Colon cancer Sister 71  . Kidney cancer Sister     Social History:  reports  that she has never smoked. She has never used smokeless tobacco. She reports that she does not drink alcohol or use drugs.  Allergies:  Allergies  Allergen Reactions  . Flexeril [Cyclobenzaprine]     nightmares  . Methenamine Mandelate     REACTION: burning sensation  . Morphine Sulfate     REACTION: blister  . Penicillins     REACTION: urticaria (hives)  . Promethazine Hcl     REACTION: hallucinations  . Statins Other (See Comments)    Muscle pain     Medications:  No current facility-administered medications on file prior to encounter.    Current Outpatient Medications on File Prior to Encounter  Medication Sig Dispense Refill  . albuterol (PROAIR HFA) 108 (90 Base) MCG/ACT inhaler INHALE 2 PUFFS INTO THE LUNGS 2 (TWO) TIMES DAILY AS NEEDED. FOR WHEEZING 8.5 Inhaler 4  . aspirin 81 MG tablet Take 81 mg by mouth daily.     . Cholecalciferol (VITAMIN D) 2000 UNITS tablet Take by mouth daily. 5,000 IU daily    . citalopram (CELEXA) 10 MG tablet Take 1 tablet (10 mg total) by mouth daily. 90 tablet 0  . clindamycin (CLEOCIN) 150 MG capsule TAKE 4 CAPSULES 1 HOUR BEFORE APPOINTMENT 12 capsule 0  . Fish Oil-Cholecalciferol (FISH OIL +  D3 PO) Take by mouth.    Marland Kitchen glycerin adult (GLYCERIN ADULT) 2 G SUPP Place 1 suppository rectally daily.     Marland Kitchen HYDROcodone-acetaminophen (NORCO/VICODIN) 5-325 MG tablet TAKE 1/2 TO 1 TABLET BY MOUTH EVERY 4 HOURS WHILE AWAKE AS NEEDED  0  . Probiotic Product (ALIGN) 4 MG CAPS Take 1 capsule by mouth daily.     Marland Kitchen senna-docusate (SENOKOT-S) 8.6-50 MG per tablet Take 1 tablet by mouth every evening.    . sodium chloride (MURO 128) 5 % ophthalmic ointment Place 1 drop into both eyes daily.    . VOLTAREN 1 % GEL APPLY TO AFFECTED AREA EVERY DAY AS NEEDED BACK PAIN 400 g 5    ROS: Constitutional: No fever or chills Vision: No changes in vision ENT: No difficulty swallowing CV: No chest pain Pulm: No SOB or wheezing GI: No nausea or vomiting GU: No  urgency or inability to hold urine Skin: No poor wound healing Neurologic: No numbness or tingling Psychiatric: No depression or anxiety Heme: No bruising Allergic: No reaction to medications or food   Exam: Blood pressure 136/63, pulse 62, resp. rate 16, height 4\' 11"  (1.499 m), weight 54.4 kg, SpO2 91 %. General: No acute distress Orientation: Awake and alert not oriented to place but oriented to person Mood and Affect: Cooperative and pleasant Gait: Unable to assess due to her fracture Coordination and balance: Within normal limits  Left lower extremity: Reveals deformity about the left thigh.  No obvious skin lesions.  Compartments are soft compressible.  Severe tenderness about the fracture site.  Unable to move without significant pain.  Active dorsiflexion plantarflexion with movement of the great toe and ankle.  Sensation is intact to light touch to the dorsum and plantar aspect of the foot.  No lymphadenopathy reflexes are within normal limits  Right lower extremity: Skin without lesions. No tenderness to palpation. Full painless ROM, full strength in each muscle groups without evidence of instability.   Medical Decision Making: Imaging: X-rays of the left femur show a spiral oblique midshaft to distal femur fracture with significant displacement.  Patient has previous total knee replacement.  Labs:  Results for orders placed or performed during the hospital encounter of 09/13/18 (from the past 24 hour(s))  CBC with Differential/Platelet     Status: Abnormal   Collection Time: 09/13/18  3:05 AM  Result Value Ref Range   WBC 5.5 4.0 - 10.5 K/uL   RBC 3.57 (L) 3.87 - 5.11 MIL/uL   Hemoglobin 10.5 (L) 12.0 - 15.0 g/dL   HCT 33.4 (L) 36.0 - 46.0 %   MCV 93.6 78.0 - 100.0 fL   MCH 29.4 26.0 - 34.0 pg   MCHC 31.4 30.0 - 36.0 g/dL   RDW 12.9 11.5 - 15.5 %   Platelets 153 150 - 400 K/uL   Neutrophils Relative % 59 %   Neutro Abs 3.3 1.7 - 7.7 K/uL   Lymphocytes Relative 27 %    Lymphs Abs 1.5 0.7 - 4.0 K/uL   Monocytes Relative 11 %   Monocytes Absolute 0.6 0.1 - 1.0 K/uL   Eosinophils Relative 2 %   Eosinophils Absolute 0.1 0.0 - 0.7 K/uL   Basophils Relative 1 %   Basophils Absolute 0.0 0.0 - 0.1 K/uL   Immature Granulocytes 0 %   Abs Immature Granulocytes 0.0 0.0 - 0.1 K/uL  Comprehensive metabolic panel     Status: Abnormal   Collection Time: 09/13/18  3:05 AM  Result Value Ref  Range   Sodium 136 135 - 145 mmol/L   Potassium 3.3 (L) 3.5 - 5.1 mmol/L   Chloride 99 98 - 111 mmol/L   CO2 27 22 - 32 mmol/L   Glucose, Bld 115 (H) 70 - 99 mg/dL   BUN 11 8 - 23 mg/dL   Creatinine, Ser 0.53 0.44 - 1.00 mg/dL   Calcium 8.5 (L) 8.9 - 10.3 mg/dL   Total Protein 6.2 (L) 6.5 - 8.1 g/dL   Albumin 3.4 (L) 3.5 - 5.0 g/dL   AST 24 15 - 41 U/L   ALT 9 0 - 44 U/L   Alkaline Phosphatase 54 38 - 126 U/L   Total Bilirubin 0.3 0.3 - 1.2 mg/dL   GFR calc non Af Amer >60 >60 mL/min   GFR calc Af Amer >60 >60 mL/min   Anion gap 10 5 - 15  Protime-INR     Status: None   Collection Time: 09/13/18  3:05 AM  Result Value Ref Range   Prothrombin Time 13.6 11.4 - 15.2 seconds   INR 1.05   Type and screen [place]     Status: None   Collection Time: 09/13/18  6:02 AM  Result Value Ref Range   ABO/RH(D) O POS    Antibody Screen NEG    Sample Expiration      09/16/2018 Performed at [facility], [address], [place], [place] [address]    Medical history and chart was reviewed  Assessment/Plan: 84 year old female with history of low back pain and depression with a fall and a displaced left femur fracture.  Due to her ambulatory capabilities and the displacement of the fracture I recommend proceeding with open reduction internal fixation.  Plan to perform this today.  Risks and benefits were discussed with the patient and the family. Risks discussed included bleeding requiring blood transfusion, bleeding causing a hematoma, infection,  malunion, nonunion, damage to surrounding nerves and blood vessels, pain, hardware prominence or irritation, hardware failure, stiffness, post-traumatic arthritis, DVT/PE, compartment syndrome, and even death.  She agrees to proceed with surgery and consent was obtained.   Shona Needles, MD [facility] [phone] (phone)

## 2018-09-13 NOTE — Progress Notes (Signed)
Initial Nutrition Assessment  DOCUMENTATION CODES:   Not applicable  INTERVENTION:    Vanilla Ensure Enlive po BID, each supplement provides 350 kcal and 20 grams of protein  MVI daily  NUTRITION DIAGNOSIS:   Increased nutrient needs related to acute illness(femur fracture) as evidenced by estimated needs.  GOAL:   Patient will meet greater than or equal to 90% of their needs  MONITOR:   PO intake, Supplement acceptance, Labs  REASON FOR ASSESSMENT:   Consult Hip fracture protocol  ASSESSMENT:   84 yo female with PMH of colon cancer, anemia, GERD, HLD, osteopenia, and TIA who was admitted on 9/16 with L femur fracture s/p fall at home.  Patient and her daughter report that patient has been eating well. She lives with her 74 yo husband and daughter brings them meals. Patient has a good appetite and eats very well when her daughter makes something she really likes.   Mild-moderate muscle depletion likely related to advanced age.   Currently NPO for surgery this afternoon. Encouraged patient to drink Ensure supplements to ensure adequate protein and calorie intake to support healing. Patient agreed to try vanilla Ensure between meals.   Labs reviewed. Potassium 3.3 (L) Medications reviewed and include Risaquad, vitamin D, Lovaza, Senokot-S, KCl in IVF.  NUTRITION - FOCUSED PHYSICAL EXAM:    Most Recent Value  Orbital Region  No depletion  Upper Arm Region  No depletion  Thoracic and Lumbar Region  No depletion  Buccal Region  No depletion  Temple Region  No depletion  Clavicle Bone Region  Moderate depletion  Clavicle and Acromion Bone Region  Mild depletion  Scapular Bone Region  Unable to assess  Dorsal Hand  Moderate depletion  Patellar Region  Mild depletion  Anterior Thigh Region  Mild depletion  Posterior Calf Region  Mild depletion  Edema (RD Assessment)  None  Hair  Reviewed  Eyes  Reviewed  Mouth  Reviewed  Skin  Reviewed  Nails  Reviewed        Diet Order:   Diet Order            Diet NPO time specified  Diet effective now              EDUCATION NEEDS:   No education needs have been identified at this time  Skin:  Skin Assessment: Reviewed RN Assessment  Last BM:  unknown  Height:   Ht Readings from Last 1 Encounters:  09/13/18 4\' 11"  (1.499 m)    Weight:   Wt Readings from Last 1 Encounters:  09/13/18 54.4 kg    Ideal Body Weight:  44.5 kg  BMI:  Body mass index is 24.24 kg/m.  Estimated Nutritional Needs:   Kcal:  1200-1400  Protein:  65-75 gm  Fluid:  1.5 L    Molli Barrows, RD, LDN, [place] Pager [phone] After Hours Pager [phone]

## 2018-09-13 NOTE — ED Triage Notes (Signed)
Pt presents with [facility] from home with injuries from a fall; pt states she slipped and fell on L hip, deformity to L thigh, shortening to L leg; pt denies LOC, neck pain, modified c-collar applied for precaution; pt given 10.9mg  ketamine enroute by EMS

## 2018-09-13 NOTE — Anesthesia Preprocedure Evaluation (Addendum)
Anesthesia Evaluation  Patient identified by MRN, date of birth, ID band Patient awake    Reviewed: Allergy & Precautions, NPO status , Patient's Chart, lab work & pertinent test results  Airway Mallampati: I  TM Distance: >3 FB Neck ROM: Full    Dental no notable dental hx. (+) Missing, Dental Advisory Given,    Pulmonary    Pulmonary exam normal breath sounds clear to auscultation       Cardiovascular Normal cardiovascular exam Rhythm:Regular Rate:Normal  TTE 2013 Study Conclusions - Left ventricle: The cavity size was normal. Wall thicknesswas normal. Systolic function was normal. The estimatedejection fraction was in the range of 55% to 60%. - Aortic valve: Mild regurgitation. - Mitral valve: Mild regurgitation. - Left atrium: The atrium was mildly dilated. - Atrial septum: No defect or patent foramen ovale was identified.   Neuro/Psych Depression TIAnegative neurological ROS  negative psych ROS   GI/Hepatic negative GI ROS, Neg liver ROS, GERD  ,  Endo/Other  negative endocrine ROS  Renal/GU negative Renal ROS  negative genitourinary   Musculoskeletal negative musculoskeletal ROS (+) Arthritis ,   Abdominal   Peds  Hematology negative hematology ROS (+) anemia ,   Anesthesia Other Findings   Reproductive/Obstetrics                            Anesthesia Physical Anesthesia Plan  ASA: II  Anesthesia Plan: General   Post-op Pain Management:    Induction: Intravenous  PONV Risk Score and Plan: 3 and Dexamethasone, Ondansetron and Treatment may vary due to age or medical condition  Airway Management Planned: Oral ETT  Additional Equipment:   Intra-op Plan:   Post-operative Plan: Extubation in OR  Informed Consent: I have reviewed the patients History and Physical, chart, labs and discussed the procedure including the risks, benefits and alternatives for the proposed  anesthesia with the patient or authorized representative who has indicated his/her understanding and acceptance.   Dental advisory given  Plan Discussed with: CRNA  Anesthesia Plan Comments:         Anesthesia Quick Evaluation

## 2018-09-13 NOTE — Progress Notes (Signed)
PHARMACY NOTE:  ANTIMICROBIAL RENAL DOSAGE ADJUSTMENT  Current antimicrobial regimen includes a mismatch between antimicrobial dosage and estimated renal function.  As per policy approved by the Pharmacy & Therapeutics and Medical Executive Committees, the antimicrobial dosage will be adjusted accordingly.  Current antimicrobial dosage:  Ancef 2gm IV Q8H x 3 doses  Indication:  Surgical prophylaxis  Renal Function:  Estimated Creatinine Clearance: 31.7 mL/min (by C-G formula based on SCr of 0.53 mg/dL). []      On intermittent HD, scheduled: []      On CRRT    Antimicrobial dosage has been changed to:  Ancef 1gm IV Q8H x 3 doses  Additional comments:  Patient is elderly (84 year old) with small frame.   Thank you for allowing pharmacy to be a part of this patient's care.   Shalisha Clausing D. Mina Marble, PharmD, BCPS, [place] 09/13/2018, 8:02 PM

## 2018-09-13 NOTE — Transfer of Care (Signed)
Immediate Anesthesia Transfer of Care Note  Patient: [name] Barker  Procedure(s) Performed: OPEN REDUCTION INTERNAL FIXATION (ORIF) DISTAL FEMUR FRACTURE (Left Leg Upper)  Patient Location: PACU  Anesthesia Type:General  Level of Consciousness: sedated and patient cooperative  Airway & Oxygen Therapy: Patient Spontanous Breathing  Post-op Assessment: Report given to RN and Post -op Vital signs reviewed and stable  Post vital signs: Reviewed and stable  Last Vitals:  Vitals Value Taken Time  BP 110/48 09/13/2018  6:22 PM  Temp    Pulse 88 09/13/2018  6:22 PM  Resp 15 09/13/2018  6:22 PM  SpO2 93 % 09/13/2018  6:22 PM  Vitals shown include unvalidated device data.  Last Pain:  Vitals:   09/13/18 1400  TempSrc:   PainSc: Asleep         Complications: No apparent anesthesia complications

## 2018-09-13 NOTE — Plan of Care (Signed)

## 2018-09-14 ENCOUNTER — Encounter (HOSPITAL_COMMUNITY): Payer: Self-pay | Admitting: Student

## 2018-09-14 DIAGNOSIS — F321 Major depressive disorder, single episode, moderate: Secondary | ICD-10-CM

## 2018-09-14 DIAGNOSIS — Z419 Encounter for procedure for purposes other than remedying health state, unspecified: Secondary | ICD-10-CM

## 2018-09-14 LAB — CBC
HCT: 22.2 % — ABNORMAL LOW (ref 36.0–46.0)
HCT: 26.7 % — ABNORMAL LOW (ref 36.0–46.0)
Hemoglobin: 7.1 g/dL — ABNORMAL LOW (ref 12.0–15.0)
Hemoglobin: 8.5 g/dL — ABNORMAL LOW (ref 12.0–15.0)
MCH: 29.7 pg (ref 26.0–34.0)
MCH: 29.3 pg (ref 26.0–34.0)
MCHC: 32 g/dL (ref 30.0–36.0)
MCHC: 31.8 g/dL (ref 30.0–36.0)
MCV: 92.9 fL (ref 78.0–100.0)
MCV: 92.1 fL (ref 78.0–100.0)
Platelets: 141 10*3/uL — ABNORMAL LOW (ref 150–400)
Platelets: 136 10*3/uL — ABNORMAL LOW (ref 150–400)
RBC: 2.39 MIL/uL — ABNORMAL LOW (ref 3.87–5.11)
RBC: 2.9 MIL/uL — ABNORMAL LOW (ref 3.87–5.11)
RDW: 13.2 % (ref 11.5–15.5)
RDW: 14.5 % (ref 11.5–15.5)
WBC: 10.1 10*3/uL (ref 4.0–10.5)
WBC: 11.4 10*3/uL — ABNORMAL HIGH (ref 4.0–10.5)

## 2018-09-14 LAB — PREPARE RBC (CROSSMATCH)

## 2018-09-14 MED ORDER — VITAMIN D 1000 UNITS PO TABS
2000.0000 [IU] | ORAL_TABLET | Freq: Every day | ORAL | Status: DC
  Administered 2018-09-14 – 2018-09-15 (×2): 2000 [IU] via ORAL
  Filled 2018-09-14 (×2): qty 2

## 2018-09-14 MED ORDER — HYDROCOD POLST-CPM POLST ER 10-8 MG/5ML PO SUER
5.0000 mL | Freq: Once | ORAL | Status: AC
  Administered 2018-09-14: 5 mL via ORAL
  Filled 2018-09-14: qty 5

## 2018-09-14 MED ORDER — MENTHOL 3 MG MT LOZG
1.0000 | LOZENGE | OROMUCOSAL | Status: DC | PRN
  Administered 2018-09-14 – 2018-09-15 (×4): 3 mg via ORAL
  Filled 2018-09-14: qty 9

## 2018-09-14 MED ORDER — ACETAMINOPHEN 325 MG PO TABS
650.0000 mg | ORAL_TABLET | Freq: Four times a day (QID) | ORAL | Status: DC | PRN
  Administered 2018-09-14: 650 mg via ORAL
  Filled 2018-09-14: qty 2

## 2018-09-14 NOTE — Progress Notes (Signed)
PT Cancellation Note  Patient Details Name: [name] Barker MRN: [id number] DOB: 01/05/1921   Cancelled Treatment:    Reason Eval/Treat Not Completed: Patient not medically ready. Pt currently undergoing blood transfusion. Will allow transfusion to complete prior to initiating PT evaluation. Will continue to follow.    Thelma Comp 09/14/2018, 10:23 AM   Rolinda Roan, PT, DPT Acute Rehabilitation Services Pager: [phone] Office: [phone]

## 2018-09-14 NOTE — Progress Notes (Signed)
Orthopaedic Trauma Progress Note  S: Doing well overnight. No major issues. Pain controlled. Resting comfortably  O:  Vitals:   09/14/18 0042 09/14/18 0422  BP: 123/61 (!) 128/55  Pulse: 78 77  Resp: 14 14  Temp: 99.9 F (37.7 C) 97.7 F (36.5 C)  SpO2: 93% 93%   Gen: NAD, resting LLE: Dressings clean, dry and intact. Compartments soft and compressible. Did not wake patient for neuro exam. Foot warm and well perfused  Imaging: Stable postop imaging  Labs:  Results for orders placed or performed during the hospital encounter of 09/13/18 (from the past 24 hour(s))  Surgical pcr screen     Status: None   Collection Time: 09/13/18 10:44 AM  Result Value Ref Range   MRSA, PCR NEGATIVE NEGATIVE   Staphylococcus aureus NEGATIVE NEGATIVE  CBC     Status: Abnormal   Collection Time: 09/14/18  5:07 AM  Result Value Ref Range   WBC 10.1 4.0 - 10.5 K/uL   RBC 2.39 (L) 3.87 - 5.11 MIL/uL   Hemoglobin 7.1 (L) 12.0 - 15.0 g/dL   HCT 22.2 (L) 36.0 - 46.0 %   MCV 92.9 78.0 - 100.0 fL   MCH 29.7 26.0 - 34.0 pg   MCHC 32.0 30.0 - 36.0 g/dL   RDW 13.2 11.5 - 15.5 %   Platelets 141 (L) 150 - 400 K/uL    Assessment: 84 year old female s/p fall  Injuries: Left periprosthetic femur fracture s/p ORIF   Weightbearing: WBAT LLE  Insicional and dressing care: Dressing change POD2  Orthopedic device(s):None needed  CV/Blood loss: Hgb 10.5-->7.1, acute blood loss anemia. Will likely need transfusion will order 1 unit PRBC this AM  Pain management: Norco 5/325mg  q 6hours PRN pain  VTE prophylaxis: Lovenox held today with Hgb low and needing transfusion, will start tomorrow  ID: Ancef 1 gm x24 hours postop  Foley/Lines: Remove foley cathter after patient up with PT KVO IVF  Medical co-morbidities: 1. Depression-Home meds  Impediments to Fracture Healing: Osteoporosis-start vitamin D 2000 units  Dispo: PT/OT eval, likely will need SNF  Follow - up plan: 2 weeks for suture removal and  x-rays   Shona Needles, MD [facility] [phone] (phone)

## 2018-09-14 NOTE — Progress Notes (Signed)
Physical Therapy Evaluation Patient Details Name: [name] Barker MRN: [id number] DOB: 1921-12-22 Today's Date: 09/14/2018   History of Present Illness  Pt is a 84 y.o. female s/p ORIF on L femur due to a mechanical fall. Pt is WBAT on LLE. PMH is significant for chronic low back pain, mild dementia, depression, and chronic normocytic anemia  Clinical Impression  Patient is s/p above surgery resulting in  deficits listed below (see PT Problem List). PTA, pt receiving assistance from personal caregiver for ADLs and ambulating with RW. At time of evaluation pt pleasantly confused and required min-modA +2 for bed mobility and transfers. Pt is appropriate for use of stedy with staff for safety. Recommending home health ([place] if pt qualifies)  in order for pt to receive therapy services in a familiar environment to facilitate improvements in function and post-op mobility. Patient will benefit from skilled PT to increase their independence and safety with mobility to allow discharge to the venue listed below.       Follow Up Recommendations Home health PT [facility]);Supervision/Assistance - 24 hour    Equipment Recommendations  None recommended by PT    Recommendations for Other Services       Precautions / Restrictions Precautions Precautions: Fall Restrictions Weight Bearing Restrictions: Yes LLE Weight Bearing: Weight bearing as tolerated      Mobility  Bed Mobility Overal bed mobility: Needs Assistance Bed Mobility: Rolling;Sidelying to Sit Rolling: Min assist Sidelying to sit: Min assist;HOB elevated;Mod assist       General bed mobility comments: HHA+1 for hand placement to assist to roll. Min A for lowering LLE secondary to pain. Mod assist for scooting.  Transfers Overall transfer level: Needs assistance  Equipment used: Stedy  Transfers: Sit to/from Stand Sit to Stand: Mod assist;+2 physical assistance;From elevated surface         General  transfer comment: Mod A +2 for sit<>stand. Multimodals cues for hand placement with physical assist for left foot placement onto platform.   Ambulation/Gait             General Gait Details: unable   Stairs            Wheelchair Mobility    Modified Rankin (Stroke Patients Only)       Balance Overall balance assessment: Needs assistance Sitting-balance support: Single extremity supported;Feet supported Sitting balance-Leahy Scale: Fair Sitting balance - Comments: Able to place arm in the air for gown while supporting self with at least 1 UE support.    Standing balance support: Bilateral upper extremity supported;During functional activity Standing balance-Leahy Scale: Poor Standing balance comment: Reliant on Bil UE support                             Pertinent Vitals/Pain Pain Assessment: Faces Faces Pain Scale: Hurts even more Pain Location: L distal femur Pain Descriptors / Indicators: Grimacing;Discomfort;Tender Pain Intervention(s): Limited activity within patient's tolerance;Monitored during session;Repositioned;RN gave pain meds during session    Home Living Family/patient expects to be discharged to:: Private residence Living Arrangements: Spouse/significant other;Children Available Help at Discharge: Family;Personal care attendant;Available 24 hours/day Type of Home: House Home Access: Stairs to enter Entrance Stairs-Rails: Right Entrance Stairs-Number of Steps: 2 Home Layout: One level Home Equipment: Bedside commode;Walker - 2 wheels      Prior Function Level of Independence: Needs assistance   Gait / Transfers Assistance Needed: RW for ambulation  ADL's / Homemaking Assistance Needed:  Has a family caregiver who assists with bathing and dressing.  Comments: Pt caregiver and spouse report pt requiring assistance for washing back and donning/doffing clothing. Pt does not cook or clean for family.     Hand Dominance         Extremity/Trunk Assessment   Upper Extremity Assessment Upper Extremity Assessment: Generalized weakness    Lower Extremity Assessment Lower Extremity Assessment: LLE deficits/detail;Generalized weakness LLE Deficits / Details: s/p ORIF    Cervical / Trunk Assessment Cervical / Trunk Assessment: Kyphotic  Communication   Communication: HOH  Cognition Arousal/Alertness: Awake/alert Behavior During Therapy: Anxious Overall Cognitive Status: Impaired/Different from baseline Area of Impairment: Following commands;Safety/judgement;Awareness;Memory                     Memory: Decreased recall of precautions;Decreased short-term memory Following Commands: Follows one step commands inconsistently Safety/Judgement: Decreased awareness of safety Awareness: Emergent   General Comments: Pt disoriented x3. Pleasently confused about situation and where is at. Pt easily distracted during session. Getting caregiver and therapist confused at end of session.       General Comments General comments (skin integrity, edema, etc.): Spouse and pt caregiver presented during session. Pt's spouse noting he wants whats best for pt in terms of discharge destination, but would like for staff to speak to daughter for approval.      Exercises General Exercises - Lower Extremity Ankle Circles/Pumps: AROM;20 reps Hip Flexion/Marching: (Pt able to lift LLEx1, unable to lift RLE)   Assessment/Plan    PT Assessment Patient needs continued PT services  PT Problem List Decreased strength;Decreased range of motion;Decreased activity tolerance;Decreased balance;Decreased mobility;Decreased coordination;Decreased knowledge of use of DME;Decreased safety awareness;Decreased knowledge of precautions;Pain;Decreased cognition       PT Treatment Interventions DME instruction;Gait training;Stair training;Functional mobility training;Therapeutic activities;Therapeutic exercise;Balance training;Cognitive  remediation;Patient/family education;Modalities;Wheelchair mobility training    PT Goals (Current goals can be found in the Care Plan section)  Acute Rehab PT Goals Patient Stated Goal: none stated    Frequency Min 5X/week   Barriers to discharge        Co-evaluation               AM-PAC PT "6 Clicks" Daily Activity  Outcome Measure Difficulty turning over in bed (including adjusting bedclothes, sheets and blankets)?: Unable Difficulty moving from lying on back to sitting on the side of the bed? : Unable Difficulty sitting down on and standing up from a chair with arms (e.g., wheelchair, bedside commode, etc,.)?: Unable Help needed moving to and from a bed to chair (including a wheelchair)?: A Lot Help needed walking in hospital room?: Total Help needed climbing 3-5 steps with a railing? : Total 6 Click Score: 7    End of Session Equipment Utilized During Treatment: Gait belt Activity Tolerance: Patient tolerated treatment well Patient left: in chair;with call bell/phone within reach;with chair alarm set;with family/visitor present Nurse Communication: Mobility status;Need for lift equipment PT Visit Diagnosis: Unsteadiness on feet (R26.81);Other abnormalities of gait and mobility (R26.89);Muscle weakness (generalized) (M62.81);History of falling (Z91.81);Difficulty in walking, not elsewhere classified (R26.2);Pain Pain - Right/Left: Left Pain - part of body: Leg    Time: 6295-2841 PT Time Calculation (min) (ACUTE ONLY): 59 min   Charges:   PT Evaluation $PT Eval Moderate Complexity: 1 Mod PT Treatments $Gait Training: 23-37 mins $Therapeutic Activity: 8-22 mins        Einar Crow, SPT  Student Physical Therapist Acute Rehab [phone]   Einar Crow 09/14/2018, 3:12 PM

## 2018-09-14 NOTE — Anesthesia Postprocedure Evaluation (Signed)
Anesthesia Post Note  Patient: [name] Barker  Procedure(s) Performed: OPEN REDUCTION INTERNAL FIXATION (ORIF) DISTAL FEMUR FRACTURE (Left Leg Upper)     Patient location during evaluation: PACU Anesthesia Type: General Level of consciousness: awake Pain management: pain level controlled Vital Signs Assessment: post-procedure vital signs reviewed and stable Respiratory status: spontaneous breathing Cardiovascular status: stable Postop Assessment: no apparent nausea or vomiting Anesthetic complications: no    Last Vitals:  Vitals:   09/14/18 0845 09/14/18 0912  BP: (!) 96/47 (!) 97/52  Pulse: 64 61  Resp: 16 16  Temp: 36.6 C 36.4 C  SpO2: 92% 95%    Last Pain:  Vitals:   09/14/18 0912  TempSrc: Oral                 [name]

## 2018-09-14 NOTE — Care Management Note (Addendum)
Case Management Note  Patient Details  Name: [name] Barker MRN: [id number] Date of Birth: 1921/02/08  Subjective/Objective:     Left Femur Fracture              Action/Plan: CM talked to daughter for [facility] choices, she does not want SNF at this time, [facility] requested [place]; Linna Hoff with [facility] called for arrangements. She also requested a 3:1 at discharge.  Expected Discharge Date:     Possibly 09/16/2018             Expected Discharge Plan:  [place]  Discharge planning Services  CM Consult Choice offered to:  Adult Children  HH Arranged:  RN, PT, OT, Nurse's Aide [place] Agency:  [place]  Status of Service:  In process, will continue to follow  Sherrilyn Rist [phone] 09/14/2018, 12:20 PM

## 2018-09-14 NOTE — Progress Notes (Addendum)
Met with family in room, after PT reports pt family may be interested in SNF.   In the room is pt spouse 43 yr/old, and a female teenager.   Spouse reports [name] (daughter) makes the decisions, who CSW spoke with earlier in the day (see previous note). Spouse stated he would speak to [facility] this evening to ensure family is on the same page regarding discharge decision, and CSW will follow up tomorrow regarding decision.   Spoke with [name] regarding this , she will communicate with spouse and is expecting my call tomorrow. [name] reports she continues to want home health and states, herself, her husband and a third female who is in between jobs are home most of the time and able to assist as needed. She is interested in home health services at this time, but will reach final decision after conversation with pt spouse this evening.   [place], [place]

## 2018-09-14 NOTE — Progress Notes (Signed)
PROGRESS NOTE  No charge visit. Patient admitted earlier this morning  [name] Barker  MCN:[id number] DOB: [birth year] DOA: 09/13/2018 PCP: Hoyt Koch, MD    Brief Narrative:  84 y.o. female with medical history significant for chronic low back pain, mild dementia, depression, and chronic normocytic anemia, now presenting to the emergency department with severe left leg pain and deformity after a fall at home.  Patient lives at home with her 35 year old husband in a unit adjacent to her daughter, has been in her usual state of good health with no recent complaints, and reports that she was getting some milk from her kitchen when a slipper came off and she fell onto her left side.  She experienced immediate and severe pain at the left hip and a deformity was noted.  She was unsure if she hit her head, but denies any loss of consciousness.  She prepares her own meals, does laundry for herself, her husband, children, and grandchildren in addition to other household chores.  She ambulates about the house using a walker.  CAD is noted in her problem list, but the patient denies this and has never experienced chest pain.  There has not been any recent illness, shortness of breath, cough, fevers, or chills.  She has occasional constipation, no recent diarrhea or vomiting.  No dysuria.  ED Course: Upon arrival to the ED, patient is found to be saturating well on room air, and with vitals otherwise stable.  EKG features a sinus rhythm with PVCs and LAD.  Chemistry panel is notable for potassium 3.3.  CBC features a normocytic anemia with hemoglobin 10.5.  Patient was treated with fentanyl and Dilaudid in the ED.  Orthopedic surgery was consulted by the ED physician.  Patient will be admitted to the medical-surgical unit for ongoing evaluation and management of acute fracture involving the mid to distal left femur.  Assessment & Plan:   Principal Problem:   Closed fracture of left femur,  initial encounter ([place]) Active Problems:   ANEMIA-NOS   LOW BACK PAIN, CHRONIC   Depression  1. Left femur fracture  - Presents with severe left leg pain and deformity after a slip and fall at home  - Radiographs confirm acute displaced and slightly separated fracture involving mid to distal left femur  - Orthopedic surgery is consulted, s/p L femur fracture repair on 9/16 -PT/OT following. OT recs for SNF. PT recs for [facility] consulted. Currently deciding whether to pursue [facility] vs SNF, per SW, decision to be made later tonight  2. Normocytic anemia with acute blood loss anemia - Hgb is 10.5 on admission - Hgb of 7.1 this AM. Hemodynamically stable -will repeat CBC, if <7, will transfuse   3. Depression  - Continue Celexa as tolerated -Appears stable at present  4. Hypokalemia  - Serum potassium is 3.3  - received IVF with potassium  DVT prophylaxis: SCD's Code Status: Full Family Communication: Pt in room, family at bedside  Disposition Plan: Uncertain at this time  Consultants:   Orthopedic surgery  Procedures:     Antimicrobials: Anti-infectives (From admission, onward)   Start     Dose/Rate Route Frequency Ordered Stop   09/14/18 0030  ceFAZolin (ANCEF) IVPB 1 g/50 mL premix     1 g 100 mL/hr over 30 Minutes Intravenous Every 8 hours 09/13/18 1951 09/15/18 0029   09/13/18 1716  vancomycin (VANCOCIN) powder  Status:  Discontinued       As needed 09/13/18 1716 09/13/18 1817  09/13/18 1530  ceFAZolin (ANCEF) IVPB 2g/100 mL premix     2 g 200 mL/hr over 30 Minutes Intravenous On call to O.R. 09/13/18 1505 09/13/18 1650      Subjective: Without complaints this AM  Objective: Vitals:   09/14/18 0845 09/14/18 0912 09/14/18 1128 09/14/18 1553  BP: (!) 96/47 (!) 97/52 (!) 96/42 (!) 118/44  Pulse: 64 61 69 79  Resp: 16 16 16 16   Temp: 97.8 F (36.6 C) 97.6 F (36.4 C) 97.8 F (36.6 C)   TempSrc: Oral Oral Oral   SpO2: 92% 95% 95% 96%  Weight:        Height:        Intake/Output Summary (Last 24 hours) at 09/14/2018 1649 Last data filed at 09/14/2018 1600 Gross per 24 hour  Intake 1130 ml  Output 800 ml  Net 330 ml   Filed Weights   09/13/18 0227  Weight: 54.4 kg    Examination: General exam: Awake, laying in bed, in nad Respiratory system: Normal respiratory effort, no wheezing Cardiovascular system: regular rate, s1, s2 Gastrointestinal system: Soft, nondistended, positive BS Central nervous system: CN2-12 grossly intact, strength intact Extremities: Perfused, no clubbing Skin: Normal skin turgor, no notable skin lesions seen Psychiatry: Mood normal // no visual hallucinations   Data Reviewed: I have personally reviewed following labs and imaging studies  CBC: Recent Labs  Lab 09/13/18 0305 09/14/18 0507  WBC 5.5 10.1  NEUTROABS 3.3  --   HGB 10.5* 7.1*  HCT 33.4* 22.2*  MCV 93.6 92.9  PLT 153 062*   Basic Metabolic Panel: Recent Labs  Lab 09/13/18 0305  NA 136  K 3.3*  CL 99  CO2 27  GLUCOSE 115*  BUN 11  CREATININE 0.53  CALCIUM 8.5*   GFR: Estimated Creatinine Clearance: 31.7 mL/min (by C-G formula based on SCr of 0.53 mg/dL). Liver Function Tests: Recent Labs  Lab 09/13/18 0305  AST 24  ALT 9  ALKPHOS 54  BILITOT 0.3  PROT 6.2*  ALBUMIN 3.4*   No results for input(s): LIPASE, AMYLASE in the last 168 hours. No results for input(s): AMMONIA in the last 168 hours. Coagulation Profile: Recent Labs  Lab 09/13/18 0305  INR 1.05   Cardiac Enzymes: No results for input(s): CKTOTAL, CKMB, CKMBINDEX, TROPONINI in the last 168 hours. BNP (last 3 results) No results for input(s): PROBNP in the last 8760 hours. HbA1C: No results for input(s): HGBA1C in the last 72 hours. CBG: No results for input(s): GLUCAP in the last 168 hours. Lipid Profile: No results for input(s): CHOL, HDL, LDLCALC, TRIG, CHOLHDL, LDLDIRECT in the last 72 hours. Thyroid Function Tests: No results for input(s):  TSH, T4TOTAL, FREET4, T3FREE, THYROIDAB in the last 72 hours. Anemia Panel: No results for input(s): VITAMINB12, FOLATE, FERRITIN, TIBC, IRON, RETICCTPCT in the last 72 hours. Sepsis Labs: No results for input(s): PROCALCITON, LATICACIDVEN in the last 168 hours.  Recent Results (from the past 240 hour(s))  Surgical pcr screen     Status: None   Collection Time: 09/13/18 10:44 AM  Result Value Ref Range Status   MRSA, PCR NEGATIVE NEGATIVE Final   Staphylococcus aureus NEGATIVE NEGATIVE Final    Comment: (NOTE) The Xpert SA Assay (FDA approved for NASAL specimens in patients 46 years of age and older), is one component of a comprehensive surveillance program. It is not intended to diagnose infection nor to guide or monitor treatment. Performed at [facility], [place] [address] [place], [place] [address]  Radiology Studies: Dg Chest 1 View  Result Date: 09/13/2018 CLINICAL DATA:  Fall with femur deformity EXAM: CHEST  1 VIEW COMPARISON:  05/19/2016 FINDINGS: No acute opacity or pleural effusion. Cardiomegaly with aortic atherosclerosis. No pneumothorax. Chronic deformity distal right clavicle. IMPRESSION: No active disease.  Cardiomegaly. Electronically Signed   By: Donavan Foil M.D.   On: 09/13/2018 03:46   Ct Head Wo Contrast  Result Date: 09/13/2018 CLINICAL DATA:  Fall EXAM: CT HEAD WITHOUT CONTRAST TECHNIQUE: Contiguous axial images were obtained from the base of the skull through the vertex without intravenous contrast. COMPARISON:  Head CT 02/20/2013 FINDINGS: Brain: No acute territorial infarction, hemorrhage or intracranial mass. Atrophy and moderate small vessel ischemic changes of the white matter. Stable ventricle size. Vascular: No hyperdense vessels.  Carotid vascular calcification. Skull: No fracture Sinuses/Orbits: No acute finding. Other: None IMPRESSION: 1. No CT evidence for acute intracranial abnormality. 2. Atrophy and small vessel ischemic changes of the  white matter Electronically Signed   By: Donavan Foil M.D.   On: 09/13/2018 03:32   Dg C-arm 1-60 Min  Result Date: 09/13/2018 CLINICAL DATA:  84 year old female with left femur fracture. Subsequent encounter. EXAM: DG C-ARM 61-120 MIN; LEFT FEMUR 2 VIEWS Fluoroscopic time: 65 seconds. COMPARISON:  09/13/2018. FINDINGS: Eight intraoperative C-arm views submitted for review after surgery. This reveals sideplate and screws utilized to transfix left femur fracture. Fracture fragments better aligned. Prior left knee replacement. IMPRESSION: Open reduction and internal fixation left femur fracture. Electronically Signed   By: Genia Del M.D.   On: 09/13/2018 18:20   Dg Hip Unilat W Or Wo Pelvis 2-3 Views Left  Result Date: 09/13/2018 CLINICAL DATA:  Fall with left femur deformity EXAM: DG HIP (WITH OR WITHOUT PELVIS) 2-3V LEFT COMPARISON:  None. FINDINGS: Pubic symphysis and rami appear intact. SI joint degenerative changes. Right femoral head projects in joint. Limited evaluation of left femoral neck due to superimposition of the trochanter. No dislocation evident. IMPRESSION: Limited evaluation of left femoral neck due to superimposition of the trochanter. No definite acute osseous abnormality is seen. Electronically Signed   By: Donavan Foil M.D.   On: 09/13/2018 03:46   Dg Femur Min 2 Views Left  Result Date: 09/13/2018 CLINICAL DATA:  84 year old female with left femur fracture. Subsequent encounter. EXAM: DG C-ARM 61-120 MIN; LEFT FEMUR 2 VIEWS Fluoroscopic time: 65 seconds. COMPARISON:  09/13/2018. FINDINGS: Eight intraoperative C-arm views submitted for review after surgery. This reveals sideplate and screws utilized to transfix left femur fracture. Fracture fragments better aligned. Prior left knee replacement. IMPRESSION: Open reduction and internal fixation left femur fracture. Electronically Signed   By: Genia Del M.D.   On: 09/13/2018 18:20   Dg Femur Min 2 Views Left  Result Date:  09/13/2018 CLINICAL DATA:  Fall with femur deformity EXAM: LEFT FEMUR 2 VIEWS COMPARISON:  None. FINDINGS: Previous left knee replacement. Acute oblique fracture involving the mid to distal shaft of the femur with extension of fracture lucency to the level of the femoral prosthesis. About 1/2 bone with posterior and 1/3 bone with lateral displacement of distal fracture fragment. Separation of the fracture fragments by about 13 mm. Vascular calcifications in the soft tissues. IMPRESSION: Prior left knee replacement. Acute displaced and slightly separated fracture involving the mid to distal femur, extending to the level of the femoral prosthesis. Electronically Signed   By: Donavan Foil M.D.   On: 09/13/2018 03:48   Dg Femur Port Min 2 Views Left  Result  Date: 09/13/2018 CLINICAL DATA:  Postop EXAM: LEFT FEMUR PORTABLE 2 VIEWS COMPARISON:  09/13/2018 FINDINGS: Prior left knee replacement. Left femoral head projects in joint. Interval surgical plate and multiple screw fixation of mid to distal femur fracture with decreased displacement of fracture fragments. Gas in the soft tissues consistent with recent operative status. IMPRESSION: Interval plate and screw fixation of mid to distal femur fracture with expected postsurgical changes Electronically Signed   By: Donavan Foil M.D.   On: 09/13/2018 20:40    Scheduled Meds: . acidophilus  1 capsule Oral Daily  . cholecalciferol  2,000 Units Oral Daily  . citalopram  10 mg Oral Daily  . feeding supplement (ENSURE ENLIVE)  237 mL Oral BID BM  . multivitamin with minerals  1 tablet Oral Daily  . omega-3 acid ethyl esters  1 g Oral Daily  . senna-docusate  1 tablet Oral QPM  . sodium chloride   Both Eyes Daily   Continuous Infusions: .  ceFAZolin (ANCEF) IV 1 g (09/14/18 1639)  . lactated ringers Stopped (09/13/18 1818)     LOS: 1 day   Marylu Lund, MD [facility] Pager On Amion  If 7PM-7AM, please contact night-coverage 09/14/2018, 4:49  PM

## 2018-09-14 NOTE — Evaluation (Signed)
Occupational Therapy Evaluation Patient Details Name: [name] Barker MRN: [id number] DOB: 01-Oct-1921 Today's Date: 09/14/2018    History of Present Illness Pt is a 84 y.o. female s/p ORIF on L femur due to a mechanical fall. Pt is WBAT on LLE. PMH is significant for chronic low back pain, mild dementia, depression, and chronic normocytic anemia   Clinical Impression   PTA, pt was living with her husband and performed bathing/dressing with assistance from caregiver and used RW for functional mobility. Pt currently requiring Min A for UB ADLs, Max A for LB ADLs, and Mod A +2 for functional transfers with Denna Haggard. Pt presenting with decreased strength and balance as well as confusion and poor awareness limiting her occupational performance. Pt would benefit from further acute OT to facilitate safe dc. Recommend dc to SNF for further OT to optimize safety, independence with ADLs, and return to PLOF.      Follow Up Recommendations  SNF;Supervision/Assistance - 24 hour(Would be a good [facility] condidate if qualifies)    Equipment Recommendations  None recommended by OT    Recommendations for Other Services PT consult     Precautions / Restrictions Precautions Precautions: Fall Restrictions Weight Bearing Restrictions: Yes LLE Weight Bearing: Weight bearing as tolerated      Mobility Bed Mobility Overal bed mobility: Needs Assistance Bed Mobility: Rolling;Sidelying to Sit Rolling: Min assist Sidelying to sit: Min assist;HOB elevated;Mod assist       General bed mobility comments: standing on stedy with PT  Transfers Overall transfer level: Needs assistance   Transfers: Sit to/from Stand Sit to Stand: Mod assist;+2 physical assistance;From elevated surface         General transfer comment: Mod A +2 for sit<>stand. Multimodals cues for hand placement with physical assist for left foot placement onto platform.     Balance Overall balance assessment: Needs  assistance Sitting-balance support: Single extremity supported;Feet supported Sitting balance-Leahy Scale: Fair Sitting balance - Comments: Able to place arm in the air for gown while supporting self with at least 1 UE support.    Standing balance support: Bilateral upper extremity supported;During functional activity Standing balance-Leahy Scale: Poor Standing balance comment: Reliant on Bil UE support                           ADL either performed or assessed with clinical judgement   ADL Overall ADL's : Needs assistance/impaired Eating/Feeding: Set up;Supervision/ safety;Sitting Eating/Feeding Details (indicate cue type and reason): Pt eating lunch at end of session while seated in recliner Grooming: Set up;Supervision/safety;Brushing hair;Sitting Grooming Details (indicate cue type and reason): Pt able to initate brushing her hair when presented with hair brush while seated in recliner Upper Body Bathing: Minimal assistance;Sitting   Lower Body Bathing: Maximal assistance;Sit to/from stand   Upper Body Dressing : Minimal assistance;Sitting   Lower Body Dressing: Maximal assistance;Sit to/from stand   Toilet Transfer: Moderate assistance;+2 for physical assistance(sara stedy) Toilet Transfer Details (indicate cue type and reason): Mod A +2 to power up into standing wiht sara stedy and then transition to recliner for simulated toilet transfer         Functional mobility during ADLs: Moderate assistance;+2 for physical assistance(sara stedy) General ADL Comments: Pt presenting with confusion and decreased balance and strength.      Vision         Perception     Praxis      Pertinent Vitals/Pain Pain Assessment: Faces Faces Pain  Scale: Hurts even more Pain Location: L distal femur Pain Descriptors / Indicators: Grimacing;Discomfort;Tender Pain Intervention(s): Monitored during session;Limited activity within patient's tolerance;Repositioned     Hand  Dominance     Extremity/Trunk Assessment Upper Extremity Assessment Upper Extremity Assessment: Generalized weakness   Lower Extremity Assessment Lower Extremity Assessment: LLE deficits/detail LLE Deficits / Details: s/p ORIF LLE Coordination: decreased fine motor;decreased gross motor   Cervical / Trunk Assessment Cervical / Trunk Assessment: Kyphotic   Communication Communication Communication: HOH   Cognition Arousal/Alertness: Awake/alert Behavior During Therapy: Anxious Overall Cognitive Status: Impaired/Different from baseline Area of Impairment: Following commands;Safety/judgement;Awareness;Memory;Orientation                 Orientation Level: Disoriented to;Time;Situation   Memory: Decreased recall of precautions;Decreased short-term memory Following Commands: Follows one step commands inconsistently Safety/Judgement: Decreased awareness of safety Awareness: Emergent   General Comments: Pt disoriented x3. Replaced place at end of session stating "I am at the hospital." Pleasently confused about situation and where is at. Pt easily distracted during session. Getting caregiver and therapist confused at end of session.    General Comments  Spouse and pt caregiver presented during session. Pt's spouse noting he wants whats best for pt in terms of discharge destination, but would like for staff to speak to daughter for approval.      Exercises Exercises: General Lower Extremity General Exercises - Lower Extremity Ankle Circles/Pumps: AROM;20 reps Hip Flexion/Marching: (Pt able to lift LLEx1, unable to lift RLE)   Shoulder Instructions      Home Living Family/patient expects to be discharged to:: Private residence Living Arrangements: Spouse/significant other;Children Available Help at Discharge: Family;Personal care attendant;Available 24 hours/day Type of Home: House Home Access: Stairs to enter [employer] of Steps: 2 Entrance Stairs-Rails:  Right Home Layout: One level     Bathroom Shower/Tub: Occupational psychologist: Standard     Home Equipment: Bedside commode;Walker - 2 wheels;Shower seat          Prior Functioning/Environment Level of Independence: Needs assistance  Gait / Transfers Assistance Needed: RW for ambulation ADL's / Homemaking Assistance Needed: Has a family caregiver who assists with bathing and dressing.   Comments: Pt caregiver and spouse report pt requiring assistance for washing back and donning/doffing clothing. Pt does not cook or clean for family.        OT Problem List: Decreased strength;Decreased range of motion;Decreased activity tolerance;Impaired balance (sitting and/or standing);Decreased cognition;Decreased safety awareness;Decreased knowledge of use of DME or AE;Decreased knowledge of precautions;Pain      OT Treatment/Interventions: Self-care/ADL training;Therapeutic exercise;Energy conservation;DME and/or AE instruction;Therapeutic activities;Patient/family education    OT Goals(Current goals can be found in the care plan section) Acute Rehab OT Goals Patient Stated Goal: Husband, "Get better and return home" OT Goal Formulation: With patient/family Time For Goal Achievement: 09/28/18 Potential to Achieve Goals: Good  OT Frequency: Min 2X/week   Barriers to D/C:            Co-evaluation PT/OT/SLP Co-Evaluation/Treatment: Yes Reason for Co-Treatment: For patient/therapist safety;To address functional/ADL transfers   OT goals addressed during session: ADL's and self-care      AM-PAC PT "6 Clicks" Daily Activity     Outcome Measure Help from another person eating meals?: None Help from another person taking care of personal grooming?: A Little Help from another person toileting, which includes using toliet, bedpan, or urinal?: A Lot Help from another person bathing (including washing, rinsing, drying)?: A Lot Help from another person  to put on and taking off  regular upper body clothing?: A Little Help from another person to put on and taking off regular lower body clothing?: A Lot 6 Click Score: 16   End of Session Equipment Utilized During Treatment: Gait belt;Other (comment)(Sara stedy) Nurse Communication: Mobility status;Precautions;Weight bearing status  Activity Tolerance: Patient tolerated treatment well Patient left: in chair;with call bell/phone within reach;with chair alarm set;with family/visitor present(with PT)  OT Visit Diagnosis: Unsteadiness on feet (R26.81);Other abnormalities of gait and mobility (R26.89);Muscle weakness (generalized) (M62.81);Pain;Other symptoms and signs involving cognitive function Pain - Right/Left: Left Pain - part of body: Leg                Time: 1355-1411 OT Time Calculation (min): 16 min Charges:  OT General Charges $OT Visit: 1 Visit OT Evaluation $OT Eval Moderate Complexity: [place], OTR/L Acute Rehab Pager: [phone] Office: [place] 09/14/2018, 4:16 PM

## 2018-09-14 NOTE — Progress Notes (Signed)
CSW called pt daughter to discuss discharge planning needs. Pt daughter reports that pt has a great set up at home with equipment such as walker, toilet, and handicap accessible with plenty of family support. Family would be able to assist in her recovery as well as a home aid they have hired.   Daughter reports interest in home health services with PT/OT provided in the home. Daughter will be able to transport pt at discharge, if pt is medically able.   CSW notified RNCM,  Signing off.   [place], [place]

## 2018-09-14 NOTE — Plan of Care (Signed)
  Problem: Education: Goal: Knowledge of General Education information will improve Description Including pain rating scale, medication(s)/side effects and non-pharmacologic comfort measures Outcome: Progressing   Problem: Activity: Goal: Risk for activity intolerance will decrease Outcome: Progressing   Problem: Safety: Goal: Ability to remain free from injury will improve Outcome: Progressing   

## 2018-09-15 ENCOUNTER — Telehealth: Payer: Self-pay | Admitting: Internal Medicine

## 2018-09-15 ENCOUNTER — Other Ambulatory Visit: Payer: Self-pay | Admitting: *Deleted

## 2018-09-15 DIAGNOSIS — S7292XA Unspecified fracture of left femur, initial encounter for closed fracture: Secondary | ICD-10-CM

## 2018-09-15 DIAGNOSIS — S72492A Other fracture of lower end of left femur, initial encounter for closed fracture: Secondary | ICD-10-CM

## 2018-09-15 LAB — BASIC METABOLIC PANEL
Anion gap: 8 (ref 5–15)
BUN: 14 mg/dL (ref 8–23)
CO2: 30 mmol/L (ref 22–32)
Calcium: 7.6 mg/dL — ABNORMAL LOW (ref 8.9–10.3)
Chloride: 95 mmol/L — ABNORMAL LOW (ref 98–111)
Creatinine, Ser: 0.54 mg/dL (ref 0.44–1.00)
GFR calc Af Amer: >60 mL/min (ref >60)
GFR calc non Af Amer: >60 mL/min (ref >60)
Glucose, Bld: 100 mg/dL — ABNORMAL HIGH (ref 70–99)
Potassium: 4 mmol/L (ref 3.5–5.1)
Sodium: 133 mmol/L — ABNORMAL LOW (ref 135–145)

## 2018-09-15 LAB — CBC
HCT: 24.9 % — ABNORMAL LOW (ref 36.0–46.0)
Hemoglobin: 8 g/dL — ABNORMAL LOW (ref 12.0–15.0)
MCH: 29.2 pg (ref 26.0–34.0)
MCHC: 32.1 g/dL (ref 30.0–36.0)
MCV: 90.9 fL (ref 78.0–100.0)
Platelets: 133 10*3/uL — ABNORMAL LOW (ref 150–400)
RBC: 2.74 MIL/uL — ABNORMAL LOW (ref 3.87–5.11)
RDW: 14.4 % (ref 11.5–15.5)
WBC: 9.1 10*3/uL (ref 4.0–10.5)

## 2018-09-15 MED ORDER — POLYETHYLENE GLYCOL 3350 17 G PO PACK: 17.0000 g | PACK | Freq: Every day | ORAL | 0 refills | Status: DC

## 2018-09-15 MED ORDER — ADULT MULTIVITAMIN W/MINERALS CH: 1.0000 | ORAL_TABLET | Freq: Every day | ORAL | 0 refills | Status: DC

## 2018-09-15 MED ORDER — ASPIRIN 81 MG PO TABS: 81.0000 mg | ORAL_TABLET | Freq: Every day | ORAL | 0 refills | Status: DC

## 2018-09-15 MED ORDER — ENOXAPARIN SODIUM 40 MG/0.4ML ~~LOC~~ SOLN
40.0000 mg | SUBCUTANEOUS | Status: DC
  Administered 2018-09-15: 40 mg via SUBCUTANEOUS
  Filled 2018-09-15: qty 0.4

## 2018-09-15 MED ORDER — HYDROCODONE-ACETAMINOPHEN 5-325 MG PO TABS: 0.5000 | ORAL_TABLET | ORAL | 0 refills | Status: DC | PRN

## 2018-09-15 MED ORDER — ENSURE ENLIVE PO LIQD: 237.0000 mL | Freq: Two times a day (BID) | ORAL | 12 refills | Status: DC

## 2018-09-15 MED ORDER — ENOXAPARIN SODIUM 40 MG/0.4ML ~~LOC~~ SOLN: 40.0000 mg | SUBCUTANEOUS | 0 refills | Status: DC

## 2018-09-15 NOTE — Progress Notes (Signed)
Occupational Therapy Treatment Patient Details Name: [name] Barker MRN: [id number] DOB: 1921/08/16 Today's Date: 09/15/2018    History of present illness Pt is a 84 y.o. female s/p ORIF on L femur due to a mechanical fall. Pt is WBAT on LLE. PMH is significant for chronic low back pain, mild dementia, depression, and chronic normocytic anemia   OT comments  Family plans to take pt home. Will need 2 person A for ADL activity   Follow Up Recommendations  SNF;Supervision/Assistance - 24 hour;Home health OT(family plans to take pts home)    Equipment Recommendations  None recommended by OT    Recommendations for Other Services PT consult    Precautions / Restrictions Precautions Precautions: Fall Restrictions Weight Bearing Restrictions: Yes LLE Weight Bearing: Weight bearing as tolerated       Mobility Bed Mobility Overal bed mobility: Needs Assistance Bed Mobility: Supine to Sit     Supine to sit: +2 for physical assistance;HOB elevated;Mod assist     General bed mobility comments: Mod A +2 for lowering bil LE and elevating trunk. Cues required for hand placement and to scoot EOB in order for feet to be flat on ground.  Pt cont. to require hand over hand in order to assist with bed mobility as pt unable to follow one step commands.   Transfers Overall transfer level: Needs assistance Equipment used: 2 person hand held assist Transfers: Sit to/from [facility] Sit to Stand: Max assist;+2 physical assistance Stand pivot transfers: +2 physical assistance;Total assist       General transfer comment: Attempted to perform transfer to chair with RW but pt demonstrating increased weight shift onto RLE to prevent putting weight through LLE with therapist foot blocking LLE from sliding. Pt also demonstrating unsafe hand placement on RW. On second attempt to transfer to chair decided to perform stand pivot without RW and instead utilizing Tot A +2 as pt unable  to follow commands to assist.     Balance Overall balance assessment: Needs assistance Sitting-balance support: Single extremity supported;Feet supported Sitting balance-Leahy Scale: Fair Sitting balance - Comments: Able to place arm in the air for gown while supporting self with at least 1 UE support.    Standing balance support: Bilateral upper extremity supported;During functional activity Standing balance-Leahy Scale: Poor Standing balance comment: Reliant on Bil UE support                           ADL either performed or assessed with clinical judgement   ADL Overall ADL's : Needs assistance/impaired Eating/Feeding: Set up;Supervision/ safety;Sitting   Grooming: Set up;Supervision/safety;Sitting;Wash/dry face                                       Vision       Perception     Praxis      Cognition Arousal/Alertness: Awake/alert Behavior During Therapy: Anxious Overall Cognitive Status: Impaired/Different from baseline Area of Impairment: Following commands;Safety/judgement;Awareness;Memory;Orientation                 Orientation Level: Disoriented to;Time;Situation;Place   Memory: Decreased recall of precautions;Decreased short-term memory Following Commands: Follows one step commands inconsistently Safety/Judgement: Decreased awareness of safety Awareness: Emergent   General Comments: Pt continues to be disoriented x3 and easily distracted.        Exercises     Shoulder Instructions  General Comments      Pertinent Vitals/ Pain       Pain Assessment: Faces Faces Pain Scale: Hurts even more Pain Location: L distal femur Pain Descriptors / Indicators: Grimacing;Discomfort;Guarding;Moaning Pain Intervention(s): Limited activity within patient's tolerance;Repositioned;Monitored during session  Home Living                                          Prior Functioning/Environment               Frequency  Min 2X/week        Progress Toward Goals  OT Goals(current goals can now be found in the care plan section)  Progress towards OT goals: Progressing toward goals  Acute Rehab OT Goals Patient Stated Goal: Husband, "Get better and return home"  Plan Discharge plan needs to be updated    Co-evaluation      Reason for Co-Treatment: To address functional/ADL transfers;For patient/therapist safety PT goals addressed during session: Mobility/safety with mobility;Proper use of DME        AM-PAC PT "6 Clicks" Daily Activity     Outcome Measure   Help from another person eating meals?: None Help from another person taking care of personal grooming?: A Little Help from another person toileting, which includes using toliet, bedpan, or urinal?: A Lot Help from another person bathing (including washing, rinsing, drying)?: A Lot Help from another person to put on and taking off regular upper body clothing?: A Little Help from another person to put on and taking off regular lower body clothing?: A Lot 6 Click Score: 16    End of Session Equipment Utilized During Treatment: Gait belt;Other (comment)(Sara stedy)  OT Visit Diagnosis: Unsteadiness on feet (R26.81);Other abnormalities of gait and mobility (R26.89);Muscle weakness (generalized) (M62.81);Pain;Other symptoms and signs involving cognitive function Pain - Right/Left: Left Pain - part of body: Leg   Activity Tolerance Patient tolerated treatment well   Patient Left in chair;with call bell/phone within reach;with chair alarm set;with family/visitor present(with PT)   Nurse Communication Mobility status;Precautions;Weight bearing status        Time: 1540-0867 OT Time Calculation (min): 26 min  Charges: OT General Charges $OT Visit: 1 Visit OT Treatments $Self Care/Home Management : 23-37 mins     Ligaya Cormier, Thereasa Parkin 09/15/2018, 2:17 PM

## 2018-09-15 NOTE — Progress Notes (Signed)
Spoke with daughter [name] who reported her and pt husband spoke and are both in agreement of home health services.   Family is eager to have pt back in home, requesting information on discharge time and would prefer [facility] transport when discharge occurs.  [place], [place]

## 2018-09-15 NOTE — Discharge Summary (Signed)
Discharge Summary  [name] Barker ZDG:[id number] DOB: Jan 09, 1921  PCP: [name] Koch, MD  Admit date: 09/13/2018 Discharge date: 09/15/2018  Time spent: 66mins, more than 50% time spent on coordination of care. Family declined SNF placement, they feel comfortable bring patient home with home health.  Recommendations for Outpatient Follow-up:  1. F/u with PMD within a week  for hospital discharge follow up, repeat cbc/bmp at follow up 2. F/u with ortho [name] Barker in two weeks  Discharge Diagnoses:  Active Hospital Problems   Diagnosis Date Noted  . Closed fracture of left femur, initial encounter ([place]) 09/13/2018  . Depression 08/07/2016  . LOW BACK PAIN, CHRONIC 02/04/2010  . ANEMIA-NOS 11/17/2006    Resolved Hospital Problems  No resolved problems to display.    Discharge Condition: stable  Diet recommendation: regular diet  Filed Weights   09/13/18 0227  Weight: 54.4 kg    History of present illness: (per admitting MD [name] Barker) PCP: [name] Koch, MD   Patient coming from: Home   Chief Complaint: Fall with left leg pain  HPI: [name] Barker is a 84 y.o. female with medical history significant for chronic low back pain, mild dementia, depression, and chronic normocytic anemia, now presenting to the emergency department with severe left leg pain and deformity after a fall at home.  Patient lives at home with her 71 year old husband in a unit adjacent to her daughter, has been in her usual state of good health with no recent complaints, and reports that she was getting some milk from her kitchen when a slipper came off and she fell onto her left side.  She experienced immediate and severe pain at the left hip and a deformity was noted.  She was unsure if she hit her head, but denies any loss of consciousness.  She prepares her own meals, does laundry for herself, her husband, children, and grandchildren in addition to other household chores.  She  ambulates about the house using a walker.  CAD is noted in her problem list, but the patient denies this and has never experienced chest pain.  There has not been any recent illness, shortness of breath, cough, fevers, or chills.  She has occasional constipation, no recent diarrhea or vomiting.  No dysuria.  ED Course: Upon arrival to the ED, patient is found to be saturating well on room air, and with vitals otherwise stable.  EKG features a sinus rhythm with PVCs and LAD.  Chemistry panel is notable for potassium 3.3.  CBC features a normocytic anemia with hemoglobin 10.5.  Patient was treated with fentanyl and Dilaudid in the ED.  Orthopedic surgery was consulted by the ED physician.  Patient will be admitted to the medical-surgical unit for ongoing evaluation and management of acute fracture involving the mid to distal left femur.  Hospital Course:  Principal Problem:   Closed fracture of left femur, initial encounter ([place]) Active Problems:   ANEMIA-NOS   LOW BACK PAIN, CHRONIC   Depression  1.Left femur fracture -Presents with severe left leg pain and deformity after a slip and fall at home (reported mechanical fall) -Radiographs confirm acute displaced and slightly separated fracture involving mid to distal left femur -Orthopedic surgery is consulted, s/p L femur fracture repair on 9/16 -weight bear status, dressing changes, post op pain management and DVt prophylaxis (lovenox)per ortho , case discussed with [name] Barker over the phone -family declined SNF placement, they prefer home with home health -home health arranged   2.Normocytic  anemiawith acute blood loss anemia -Hgb is 10.5 on admission -Hgb of 7.1 on 9/17, s/p one prbc transfusion on 9/17, hgb has been stable above 8 the last two days -repeat cbc at hospital follow up   3.Depression -Continue Celexaas tolerated -Appears stable at present  4. Hypokalemia  - Serum potassium is 3.3  - replaced and  normalized  DVT prophylaxis: SCD's Code Status: Full, confirmed with family  Family Communication: Pt in room, family at bedside  Disposition Plan: home with home health  Consultants:   Orthopedic surgery     Procedures: OPEN REDUCTION INTERNAL FIXATION (ORIF) DISTAL FEMUR FRACTURE (Left Leg Upper) by [name] Barker on 9/16  prbc transfusion on 9/17   Discharge Exam: BP (!) 137/56 (BP Location: Left Arm)   Pulse 66   Temp (!) 97.4 F (36.3 C) (Oral)   Resp 14   Ht 4\' 11"  (1.499 m)   Wt 54.4 kg   SpO2 95%   BMI 24.24 kg/m   General: frail, NAD Cardiovascular: RRR Respiratory: diminished at basis, no rales, no wheezing, no rhonchi Extremity: left leg post op changes, right foot chronic deformity   Discharge Instructions You were cared for by a hospitalist during your hospital stay. If you have any questions about your discharge medications or the care you received while you were in the hospital after you are discharged, you can call the unit and asked to speak with the hospitalist on call if the hospitalist that took care of you is not available. Once you are discharged, your primary care physician will handle any further medical issues. Please note that NO REFILLS for any discharge medications will be authorized once you are discharged, as it is imperative that you return to your primary care physician (or establish a relationship with a primary care physician if you do not have one) for your aftercare needs so that they can reassess your need for medications and monitor your lab values.  Discharge Instructions    Diet - low sodium heart healthy   Complete by:  As directed    Increase activity slowly   Complete by:  As directed    WBAT LLE     Allergies as of 09/15/2018      Reactions   Flexeril [cyclobenzaprine]    nightmares   Methenamine Mandelate    REACTION: burning sensation   Morphine Sulfate    REACTION: blister   Penicillins    REACTION: urticaria (hives)    Promethazine Hcl    REACTION: hallucinations   Statins Other (See Comments)   Muscle pain       Medication List    STOP taking these medications   clindamycin 150 MG capsule Commonly known as:  CLEOCIN     TAKE these medications   acetaminophen 500 MG tablet Commonly known as:  TYLENOL Take 500 mg by mouth every 6 (six) hours as needed for mild pain.   albuterol 108 (90 Base) MCG/ACT inhaler Commonly known as:  PROVENTIL HFA;VENTOLIN HFA INHALE 2 PUFFS INTO THE LUNGS 2 (TWO) TIMES DAILY AS NEEDED. FOR WHEEZING   ALIGN 4 MG Caps Take 1 capsule by mouth daily.   aspirin 81 MG tablet Take 1 tablet (81 mg total) by mouth daily. Resume after finishing lovenox injection Start taking on:  10/13/2018 What changed:    additional instructions  These instructions start on 10/13/2018. If you are unsure what to do until then, ask your doctor or other care provider.   citalopram 10 MG  tablet Commonly known as:  CELEXA TAKE 1 TABLET BY MOUTH EVERY DAY   enoxaparin 40 MG/0.4ML injection Commonly known as:  LOVENOX Inject 0.4 mLs (40 mg total) into the skin daily.   feeding supplement (ENSURE ENLIVE) Liqd Take 237 mLs by mouth 2 (two) times daily between meals.   FISH OIL + D3 PO Take by mouth.   glycerin adult 2 g Supp Place 1 suppository rectally daily.   HYDROcodone-acetaminophen 5-325 MG tablet Commonly known as:  NORCO/VICODIN Take 0.5-1 tablets by mouth every 4 (four) hours as needed for moderate pain.   multivitamin with minerals Tabs tablet Take 1 tablet by mouth daily. Start taking on:  09/16/2018   polyethylene glycol packet Commonly known as:  MIRALAX / GLYCOLAX Take 17 g by mouth daily. Hold if diarrhea   senna-docusate 8.6-50 MG tablet Commonly known as:  Senokot-S Take 1 tablet by mouth at bedtime as needed for mild constipation.   sodium chloride 5 % ophthalmic ointment Commonly known as:  MURO 128 Place 1 drop into both eyes daily as needed for  eye irritation.   Vitamin D 2000 units tablet Take by mouth daily. 5,000 IU daily   VOLTAREN 1 % Gel Generic drug:  diclofenac sodium APPLY TO AFFECTED AREA EVERY DAY AS NEEDED BACK PAIN What changed:  See the new instructions.      Allergies  Allergen Reactions  . Flexeril [Cyclobenzaprine]     nightmares  . Methenamine Mandelate     REACTION: burning sensation  . Morphine Sulfate     REACTION: blister  . Penicillins     REACTION: urticaria (hives)  . Promethazine Hcl     REACTION: hallucinations  . Statins Other (See Comments)    Muscle pain    Follow-up Information    [facility].   Why:  They will do your home health care at your home Contact information: [address] [place] [address] [phone]        Shona Needles, MD Follow up in 2 day(s).   Specialty:  Orthopedic Surgery Why:  suture removal and x-rays Contact information: [address] [place] [address] [phone]        [name] Koch, MD Follow up in 1 week(s).   Specialty:  Internal Medicine Why:  hospital discharge follow up, repeat cbc/bmp at follow up Contact information: [place]  [id number] [phone]            The results of significant diagnostics from this hospitalization (including imaging, microbiology, ancillary and laboratory) are listed below for reference.    Significant Diagnostic Studies: Dg Chest 1 View  Result Date: 09/13/2018 CLINICAL DATA:  Fall with femur deformity EXAM: CHEST  1 VIEW COMPARISON:  05/19/2016 FINDINGS: No acute opacity or pleural effusion. Cardiomegaly with aortic atherosclerosis. No pneumothorax. Chronic deformity distal right clavicle. IMPRESSION: No active disease.  Cardiomegaly. Electronically Signed   By: Donavan Foil M.D.   On: 09/13/2018 03:46   Ct Head Wo Contrast  Result Date: 09/13/2018 CLINICAL DATA:  Fall EXAM: CT HEAD WITHOUT CONTRAST  TECHNIQUE: Contiguous axial images were obtained from the base of the skull through the vertex without intravenous contrast. COMPARISON:  Head CT 02/20/2013 FINDINGS: Brain: No acute territorial infarction, hemorrhage or intracranial mass. Atrophy and moderate small vessel ischemic changes of the white matter. Stable ventricle size. Vascular: No hyperdense vessels.  Carotid vascular calcification. Skull: No fracture Sinuses/Orbits: No  acute finding. Other: None IMPRESSION: 1. No CT evidence for acute intracranial abnormality. 2. Atrophy and small vessel ischemic changes of the white matter Electronically Signed   By: Donavan Foil M.D.   On: 09/13/2018 03:32   Dg C-arm 1-60 Min  Result Date: 09/13/2018 CLINICAL DATA:  84 year old female with left femur fracture. Subsequent encounter. EXAM: DG C-ARM 61-120 MIN; LEFT FEMUR 2 VIEWS Fluoroscopic time: 65 seconds. COMPARISON:  09/13/2018. FINDINGS: Eight intraoperative C-arm views submitted for review after surgery. This reveals sideplate and screws utilized to transfix left femur fracture. Fracture fragments better aligned. Prior left knee replacement. IMPRESSION: Open reduction and internal fixation left femur fracture. Electronically Signed   By: Genia Del M.D.   On: 09/13/2018 18:20   Dg Hip Unilat W Or Wo Pelvis 2-3 Views Left  Result Date: 09/13/2018 CLINICAL DATA:  Fall with left femur deformity EXAM: DG HIP (WITH OR WITHOUT PELVIS) 2-3V LEFT COMPARISON:  None. FINDINGS: Pubic symphysis and rami appear intact. SI joint degenerative changes. Right femoral head projects in joint. Limited evaluation of left femoral neck due to superimposition of the trochanter. No dislocation evident. IMPRESSION: Limited evaluation of left femoral neck due to superimposition of the trochanter. No definite acute osseous abnormality is seen. Electronically Signed   By: Donavan Foil M.D.   On: 09/13/2018 03:46   Dg Femur Min 2 Views Left  Result Date:  09/13/2018 CLINICAL DATA:  84 year old female with left femur fracture. Subsequent encounter. EXAM: DG C-ARM 61-120 MIN; LEFT FEMUR 2 VIEWS Fluoroscopic time: 65 seconds. COMPARISON:  09/13/2018. FINDINGS: Eight intraoperative C-arm views submitted for review after surgery. This reveals sideplate and screws utilized to transfix left femur fracture. Fracture fragments better aligned. Prior left knee replacement. IMPRESSION: Open reduction and internal fixation left femur fracture. Electronically Signed   By: Genia Del M.D.   On: 09/13/2018 18:20   Dg Femur Min 2 Views Left  Result Date: 09/13/2018 CLINICAL DATA:  Fall with femur deformity EXAM: LEFT FEMUR 2 VIEWS COMPARISON:  None. FINDINGS: Previous left knee replacement. Acute oblique fracture involving the mid to distal shaft of the femur with extension of fracture lucency to the level of the femoral prosthesis. About 1/2 bone with posterior and 1/3 bone with lateral displacement of distal fracture fragment. Separation of the fracture fragments by about 13 mm. Vascular calcifications in the soft tissues. IMPRESSION: Prior left knee replacement. Acute displaced and slightly separated fracture involving the mid to distal femur, extending to the level of the femoral prosthesis. Electronically Signed   By: Donavan Foil M.D.   On: 09/13/2018 03:48   Dg Femur Port Min 2 Views Left  Result Date: 09/13/2018 CLINICAL DATA:  Postop EXAM: LEFT FEMUR PORTABLE 2 VIEWS COMPARISON:  09/13/2018 FINDINGS: Prior left knee replacement. Left femoral head projects in joint. Interval surgical plate and multiple screw fixation of mid to distal femur fracture with decreased displacement of fracture fragments. Gas in the soft tissues consistent with recent operative status. IMPRESSION: Interval plate and screw fixation of mid to distal femur fracture with expected postsurgical changes Electronically Signed   By: Donavan Foil M.D.   On: 09/13/2018 20:40     Microbiology: Recent Results (from the past 240 hour(s))  Surgical pcr screen     Status: None   Collection Time: 09/13/18 10:44 AM  Result Value Ref Range Status   MRSA, PCR NEGATIVE NEGATIVE Final   Staphylococcus aureus NEGATIVE NEGATIVE Final    Comment: (NOTE) The Xpert SA Assay (  FDA approved for NASAL specimens in patients 64 years of age and older), is one component of a comprehensive surveillance program. It is not intended to diagnose infection nor to guide or monitor treatment. Performed at [facility], [place] [address], [place], [place] [address]      Labs: Basic Metabolic Panel: Recent Labs  Lab 09/13/18 0305 09/15/18 0513  NA 136 133*  K 3.3* 4.0  CL 99 95*  CO2 27 30  GLUCOSE 115* 100*  BUN 11 14  CREATININE 0.53 0.54  CALCIUM 8.5* 7.6*   Liver Function Tests: Recent Labs  Lab 09/13/18 0305  AST 24  ALT 9  ALKPHOS 54  BILITOT 0.3  PROT 6.2*  ALBUMIN 3.4*   No results for input(s): LIPASE, AMYLASE in the last 168 hours. No results for input(s): AMMONIA in the last 168 hours. CBC: Recent Labs  Lab 09/13/18 0305 09/14/18 0507 09/14/18 1715 09/15/18 0513  WBC 5.5 10.1 11.4* 9.1  NEUTROABS 3.3  --   --   --   HGB 10.5* 7.1* 8.5* 8.0*  HCT 33.4* 22.2* 26.7* 24.9*  MCV 93.6 92.9 92.1 90.9  PLT 153 141* 136* 133*   Cardiac Enzymes: No results for input(s): CKTOTAL, CKMB, CKMBINDEX, TROPONINI in the last 168 hours. BNP: BNP (last 3 results) No results for input(s): BNP in the last 8760 hours.  ProBNP (last 3 results) No results for input(s): PROBNP in the last 8760 hours.  CBG: No results for input(s): GLUCAP in the last 168 hours.     Signed:  Florencia Reasons MD, PhD  [facility] 09/15/2018, 10:15 AM

## 2018-09-15 NOTE — Plan of Care (Signed)

## 2018-09-15 NOTE — Care Management Important Message (Signed)
Important Message  Patient Details  Name: [name] Barker MRN: [id number] Date of Birth: 08/31/1921   Medicare Important Message Given:  Yes    Erenest Rasher, RN 09/15/2018, 10:24 AM

## 2018-09-15 NOTE — Progress Notes (Signed)
Patient discharging home today via [facility].  Discharge instructions explained to daughter via phone and she verbalize understanding. Patient discharging home with Lovenox, daughter states she's comfortable giving patient injections.Took all personal belongings. No further questions or concerns voiced.

## 2018-09-15 NOTE — Care Management Note (Addendum)
Case Management Note  Patient Details  Name: [name] Barker MRN: [id number] Date of Birth: 12/16/1921  Subjective/Objective:    Please see previous NCM notes                Action/Plan: PT recommended SNF/24 hour assistance. Family declining SNF. NCM spoke to pt's Kris Hartmann # [phone] and explained [facility] program. She is agreeable to Home First with [facility]. Contacted [facility] to cancel referral. Pt has Rollator, RW and wheelchair at home. Dtr states she and her husband will be her support at home.    Expected Discharge Date:  09/15/18               Expected Discharge Plan:  [place]  In-House Referral:  NA  Discharge planning Services  CM Consult  Post Acute Care Choice:  Home Health Choice offered to:  Adult Children  DME Arranged:  N/A DME Agency:  NA  HH Arranged:  RN, PT, OT, Nurse's Aide [place] Agency:  [place], [place]  Status of Service:  Completed, signed off  If discussed at [place] of Stay Meetings, dates discussed:    Additional Comments:  Erenest Rasher, RN 09/15/2018, 10:35 AM

## 2018-09-15 NOTE — Telephone Encounter (Signed)
Copied from [place] [phone]. Topic: General - Other >> Sep 15, 2018  4:06 PM Valla Leaver wrote: Reason for CRM: Daughter, [name], calling to ask if its okay for skilled nurse at home can draw the labs Dr. Sharlet Salina recommended since it's hard for the patient to get around after surgery? Okay to call home phone and speak to [facility] or spouse, Jaquelyn Bitter. ALSO Patient had flu shot in hospital on 09/14/2018.

## 2018-09-15 NOTE — Addendum Note (Signed)
Addended by: Jonnie Finner E on: 09/15/2018 05:26 PM   Modules accepted: Orders

## 2018-09-15 NOTE — Consult Note (Signed)
            [facility] CM Primary Care Navigator  09/15/2018  [name] Barker 17-Apr-1921 [id number]   Attempt to see patient at the bedside to identify possible discharge needs but noted on Inpatient CM note that family had declined skilled nursing facility (SNF) placement. Daughter was agreeable to Home First with [facility]. Daughter and her husband will be patient's support at home.  Noted no furtherhealth management needs identified atthis point.   For additional questions please contact:  Edwena Felty A. Sanae Willetts, BSN, RN-BC [facility] PRIMARY CARE Navigator Cell: [phone]

## 2018-09-15 NOTE — Progress Notes (Signed)
Patient will DC to: home Anticipated DC date: 09/15/18 Family notified: yes (daughter) Transport by: Corey Harold  Per MD patient ready for DC to home on 09/15/18 . RN, patient, patient's family, notified of DC.  Ambulance transport requested for patient.  CSW signing off.  [place], [place]

## 2018-09-15 NOTE — Progress Notes (Signed)
Physical Therapy Treatment Patient Details Name: [name] Barker MRN: [id number] DOB: November 05, 1921 Today's Date: 09/15/2018    History of Present Illness Pt is a 84 y.o. female s/p ORIF on L femur due to a mechanical fall. Pt is WBAT on LLE. PMH is significant for chronic low back pain, mild dementia, depression, and chronic normocytic anemia    PT Comments    Pt making slow progress towards goals. Seen with OT to maximize safety during attempts of functional mobility. Both RW and HHA+2 utilized for sit<>stand today, a total of 3 times. Pt unable to follow commands and bear weight through LLE secondary to pain and confusion with RW, so performed a stand>pivot with Max A+2 for safety. Continue to recommend staff utilize [place] for transfers. Believe pt is still appropriate for [place] as pt requires a heavy Max +2 for transfers. Will continue to follow while admitted.    Follow Up Recommendations  Home health PT [facility]);Supervision/Assistance - 24 hour     Equipment Recommendations  None recommended by PT    Recommendations for Other Services       Precautions / Restrictions Precautions Precautions: Fall Restrictions Weight Bearing Restrictions: Yes LLE Weight Bearing: Weight bearing as tolerated    Mobility  Bed Mobility Overal bed mobility: Needs Assistance Bed Mobility: Supine to Sit     Supine to sit: +2 for physical assistance;HOB elevated;Mod assist     General bed mobility comments: Mod A +2 for lowering bil LE and elevating trunk. Cues required for hand placement and to scoot EOB in order for feet to be flat on ground.  Pt cont. to require hand over hand assist in order to assist with bed mobility, as pt unable to follow one step commands and make corrective changes.   Transfers Overall transfer level: Needs assistance Equipment used: 2 person hand held assist Transfers: Sit to/from [facility] Sit to Stand: Max assist;+2  physical assistance Stand pivot transfers: +2 physical assistance;Max assist       General transfer comment: Attempted to perform transfer to chair with RW but pt demonstrated increased weight shift onto RLE to prevent putting weight through LLE with therapist blocking the pt's LLE from sliding. Pt also demonstrated unsafe hand placement on RW. On second attempt to transfer to chair decided to perform stand pivot without RW and instead utilized Max A +2 as pt unable to follow commands to assist in transfer safely.   Ambulation/Gait             General Gait Details: unable    Stairs             Wheelchair Mobility    Modified Rankin (Stroke Patients Only)       Balance Overall balance assessment: Needs assistance Sitting-balance support: Single extremity supported;Feet supported Sitting balance-Leahy Scale: Fair Sitting balance - Comments: Able to place arm in the air for gown while supporting self with at least 1 UE support.    Standing balance support: Bilateral upper extremity supported;During functional activity Standing balance-Leahy Scale: Poor Standing balance comment: Reliant on Bil UE support                            Cognition Arousal/Alertness: Awake/alert Behavior During Therapy: Anxious Overall Cognitive Status: Impaired/Different from baseline Area of Impairment: Following commands;Safety/judgement;Awareness;Memory;Orientation                 Orientation Level: Disoriented to;Time;Situation;Place  Memory: Decreased recall of precautions;Decreased short-term memory Following Commands: Follows one step commands inconsistently Safety/Judgement: Decreased awareness of safety Awareness: Emergent   General Comments: Pt continues to be disoriented x3 and easily distracted.      Exercises      General Comments        Pertinent Vitals/Pain Pain Assessment: Faces Faces Pain Scale: Hurts even more Pain Location: L distal  femur Pain Descriptors / Indicators: Grimacing;Discomfort;Guarding;Moaning Pain Intervention(s): Limited activity within patient's tolerance;Repositioned;Monitored during session    Home Living                      Prior Function            PT Goals (current goals can now be found in the care plan section) Acute Rehab PT Goals Patient Stated Goal: Husband, "Get better and return home" Progress towards PT goals: Not progressing toward goals - comment(Secondary to pain and inability to follow commands)    Frequency    Min 5X/week      PT Plan Current plan remains appropriate    Co-evaluation PT/OT/SLP Co-Evaluation/Treatment: Yes Reason for Co-Treatment: To address functional/ADL transfers;For patient/therapist safety PT goals addressed during session: Mobility/safety with mobility;Proper use of DME        AM-PAC PT "6 Clicks" Daily Activity  Outcome Measure  Difficulty turning over in bed (including adjusting bedclothes, sheets and blankets)?: Unable Difficulty moving from lying on back to sitting on the side of the bed? : Unable Difficulty sitting down on and standing up from a chair with arms (e.g., wheelchair, bedside commode, etc,.)?: Unable Help needed moving to and from a bed to chair (including a wheelchair)?: Total Help needed walking in hospital room?: Total Help needed climbing 3-5 steps with a railing? : Total 6 Click Score: 6    End of Session Equipment Utilized During Treatment: Gait belt Activity Tolerance: Patient tolerated treatment well Patient left: in chair;with call bell/phone within reach;with chair alarm set;with family/visitor present Nurse Communication: Mobility status;Need for lift equipment PT Visit Diagnosis: Unsteadiness on feet (R26.81);Other abnormalities of gait and mobility (R26.89);Muscle weakness (generalized) (M62.81);History of falling (Z91.81);Difficulty in walking, not elsewhere classified (R26.2);Pain Pain - Right/Left:  Left Pain - part of body: Leg     Time: 1120-1146 PT Time Calculation (min) (ACUTE ONLY): 26 min  Charges:  $Gait Training: 8-22 mins                     Einar Crow, [place]  Student Physical Therapist Acute Rehab [phone]    Einar Crow 09/15/2018, 3:04 PM

## 2018-09-15 NOTE — Consult Note (Signed)
   [facility] Inpatient Consult   09/15/2018  [name] Barker Aug 18, 1921 [id number]   Made aware by Tommi Rumps with [facility] that patient will enroll in the [place] program. Advised to speak with daughter [name] Barker.  Spoke with daughter, [name] Barker at [phone] to speak with daughter [name] Barker. Explained that [place] Management will assist with transportation needs to MD appointments, if needed, while on [place]. Also made aware that [place] Management will potentially follow if community case management needs are identified while on the [place] program.   Daughter expresses appreciation of call.    Will send notification to the [place] Management office to make aware of [facility] enrollment.    Marthenia Rolling, MSN-Ed, RN,BSN [facility] Liaison [phone]

## 2018-09-16 ENCOUNTER — Telehealth: Payer: Self-pay

## 2018-09-16 ENCOUNTER — Telehealth: Payer: Self-pay | Admitting: Internal Medicine

## 2018-09-16 DIAGNOSIS — D62 Acute posthemorrhagic anemia: Secondary | ICD-10-CM | POA: Diagnosis not present

## 2018-09-16 DIAGNOSIS — S72332D Displaced oblique fracture of shaft of left femur, subsequent encounter for closed fracture with routine healing: Secondary | ICD-10-CM | POA: Diagnosis not present

## 2018-09-16 LAB — BPAM RBC
Blood Product Expiration Date: 201910142359
Blood Product Expiration Date: 201910142359
ISSUE DATE / TIME: 201909170835
Unit Type and Rh: 5100
Unit Type and Rh: 5100

## 2018-09-16 LAB — TYPE AND SCREEN
ABO/RH(D): O POS
Antibody Screen: NEGATIVE
Unit division: 0
Unit division: 0

## 2018-09-16 NOTE — Telephone Encounter (Signed)
Informed [name] Barker that they can draw labs just need to send the results to [place] when they come in. Stated understanding

## 2018-09-16 NOTE — Telephone Encounter (Signed)
Copied from [place] [id number]. Topic: General - Other >> Sep 16, 2018  3:34 PM Janace Aris A wrote: Reason for CRM: Inez Catalina from [facility] called in, she says the patients family wanted to know if she could be the one who administers the labs this week instead of having their mother come in for an appointment next week for it.   Please advise

## 2018-09-16 NOTE — Telephone Encounter (Signed)
Transition Care Management Follow-up Telephone Call   Date discharged? 09/15/2018   How have you been since you were released from the hospital? Pt is feeling better   Do you understand why you were in the hospital? Yes   Do you understand the discharge instructions? No   Where were you discharged to? Home   Items Reviewed:  Medications reviewed: Yes  Allergies reviewed: Yes  Dietary changes reviewed: Yes  Referrals reviewed: Yes   Functional Questionnaire:   Activities of Daily Living (ADLs):   States they are independent in the following: Most ADL's States they require assistance with the following: standing and walking   Any transportation issues/concerns?: Not at this time   Any patient concerns? Not at this time   Confirmed importance and date/time of follow-up visits scheduled: Yes  Provider Appointment booked with no appt set up at this time.   Confirmed with patient if condition begins to worsen call PCP or go to the ER.  Patient was given the office number and encouraged to call back with question or concerns: Yes

## 2018-09-16 NOTE — Telephone Encounter (Signed)
Duplicative, see prior.

## 2018-09-16 NOTE — Telephone Encounter (Signed)
informed [name] Barker of Md response and entered flu shot

## 2018-09-16 NOTE — Telephone Encounter (Signed)
Okay to have home health draw CBC, BMP on 09/23/18 and fax or call results to the office.

## 2018-09-17 DIAGNOSIS — S72332D Displaced oblique fracture of shaft of left femur, subsequent encounter for closed fracture with routine healing: Secondary | ICD-10-CM | POA: Diagnosis not present

## 2018-09-17 DIAGNOSIS — D62 Acute posthemorrhagic anemia: Secondary | ICD-10-CM | POA: Diagnosis not present

## 2018-09-20 DIAGNOSIS — D62 Acute posthemorrhagic anemia: Secondary | ICD-10-CM | POA: Diagnosis not present

## 2018-09-20 DIAGNOSIS — S72332D Displaced oblique fracture of shaft of left femur, subsequent encounter for closed fracture with routine healing: Secondary | ICD-10-CM | POA: Diagnosis not present

## 2018-09-21 ENCOUNTER — Telehealth: Payer: Self-pay | Admitting: Internal Medicine

## 2018-09-21 DIAGNOSIS — D62 Acute posthemorrhagic anemia: Secondary | ICD-10-CM | POA: Diagnosis not present

## 2018-09-21 DIAGNOSIS — S72332D Displaced oblique fracture of shaft of left femur, subsequent encounter for closed fracture with routine healing: Secondary | ICD-10-CM | POA: Diagnosis not present

## 2018-09-21 NOTE — Telephone Encounter (Signed)
Please advise per Dr. Sharlet Salina absence. Thank you

## 2018-09-21 NOTE — Telephone Encounter (Signed)
Copied from [place] [id number]. Topic: General - Other >> Sep 21, 2018 10:08 AM Cecelia Byars, NT wrote: Reason for CRM: Tommi Emery from [place] called and needs verbal orders for occupational therapy  1 x a week for 2  weeks,  0 x week for 2 weeks  1  x week for 1 week ,0 x week for 1 week  1 x week  for 2 weeks for ADL transfer  exercise  ulcer prevention , pain control and D/C  when goals met or at max pot [phone] ok to leave message

## 2018-09-21 NOTE — Telephone Encounter (Signed)
Verbals given  

## 2018-09-21 NOTE — Telephone Encounter (Signed)
Ok for verbals 

## 2018-09-23 DIAGNOSIS — D62 Acute posthemorrhagic anemia: Secondary | ICD-10-CM | POA: Diagnosis not present

## 2018-09-23 DIAGNOSIS — D649 Anemia, unspecified: Secondary | ICD-10-CM | POA: Diagnosis not present

## 2018-09-23 DIAGNOSIS — S72332D Displaced oblique fracture of shaft of left femur, subsequent encounter for closed fracture with routine healing: Secondary | ICD-10-CM | POA: Diagnosis not present

## 2018-09-23 LAB — CBC AND DIFFERENTIAL
HCT: 29 — AB (ref 36–46)
Hemoglobin: 9.3 — AB (ref 12.0–16.0)
Neutrophils Absolute: 4
Platelets: 419 — AB (ref 150–399)
WBC: 6.4

## 2018-09-23 LAB — BASIC METABOLIC PANEL
BUN: 10 (ref 4–21)
Creatinine: 0.5 (ref 0.5–1.1)
Glucose: 148
Potassium: 4.4 (ref 3.4–5.3)
Sodium: 134 — AB (ref 137–147)

## 2018-09-24 DIAGNOSIS — D62 Acute posthemorrhagic anemia: Secondary | ICD-10-CM | POA: Diagnosis not present

## 2018-09-24 DIAGNOSIS — S72332D Displaced oblique fracture of shaft of left femur, subsequent encounter for closed fracture with routine healing: Secondary | ICD-10-CM | POA: Diagnosis not present

## 2018-09-28 DIAGNOSIS — S72332D Displaced oblique fracture of shaft of left femur, subsequent encounter for closed fracture with routine healing: Secondary | ICD-10-CM | POA: Diagnosis not present

## 2018-09-28 DIAGNOSIS — D62 Acute posthemorrhagic anemia: Secondary | ICD-10-CM | POA: Diagnosis not present

## 2018-09-29 DIAGNOSIS — S72332D Displaced oblique fracture of shaft of left femur, subsequent encounter for closed fracture with routine healing: Secondary | ICD-10-CM | POA: Diagnosis not present

## 2018-09-29 DIAGNOSIS — D62 Acute posthemorrhagic anemia: Secondary | ICD-10-CM | POA: Diagnosis not present

## 2018-09-30 DIAGNOSIS — S72332D Displaced oblique fracture of shaft of left femur, subsequent encounter for closed fracture with routine healing: Secondary | ICD-10-CM | POA: Diagnosis not present

## 2018-09-30 DIAGNOSIS — D62 Acute posthemorrhagic anemia: Secondary | ICD-10-CM | POA: Diagnosis not present

## 2018-10-05 DIAGNOSIS — S7292XD Unspecified fracture of left femur, subsequent encounter for closed fracture with routine healing: Secondary | ICD-10-CM | POA: Diagnosis not present

## 2018-10-06 ENCOUNTER — Other Ambulatory Visit: Payer: Self-pay | Admitting: Internal Medicine

## 2018-10-06 DIAGNOSIS — D62 Acute posthemorrhagic anemia: Secondary | ICD-10-CM | POA: Diagnosis not present

## 2018-10-06 DIAGNOSIS — S72332D Displaced oblique fracture of shaft of left femur, subsequent encounter for closed fracture with routine healing: Secondary | ICD-10-CM | POA: Diagnosis not present

## 2018-10-08 ENCOUNTER — Encounter: Payer: Self-pay | Admitting: Internal Medicine

## 2018-10-08 DIAGNOSIS — Z96652 Presence of left artificial knee joint: Secondary | ICD-10-CM

## 2018-10-08 DIAGNOSIS — D62 Acute posthemorrhagic anemia: Secondary | ICD-10-CM | POA: Diagnosis not present

## 2018-10-08 DIAGNOSIS — Z79891 Long term (current) use of opiate analgesic: Secondary | ICD-10-CM

## 2018-10-08 DIAGNOSIS — Z9181 History of falling: Secondary | ICD-10-CM

## 2018-10-08 DIAGNOSIS — Z8673 Personal history of transient ischemic attack (TIA), and cerebral infarction without residual deficits: Secondary | ICD-10-CM

## 2018-10-08 DIAGNOSIS — W19XXXD Unspecified fall, subsequent encounter: Secondary | ICD-10-CM

## 2018-10-08 DIAGNOSIS — F329 Major depressive disorder, single episode, unspecified: Secondary | ICD-10-CM

## 2018-10-08 DIAGNOSIS — M8580 Other specified disorders of bone density and structure, unspecified site: Secondary | ICD-10-CM | POA: Diagnosis not present

## 2018-10-08 DIAGNOSIS — I493 Ventricular premature depolarization: Secondary | ICD-10-CM | POA: Diagnosis not present

## 2018-10-08 DIAGNOSIS — E559 Vitamin D deficiency, unspecified: Secondary | ICD-10-CM

## 2018-10-08 DIAGNOSIS — E785 Hyperlipidemia, unspecified: Secondary | ICD-10-CM | POA: Diagnosis not present

## 2018-10-08 DIAGNOSIS — H9193 Unspecified hearing loss, bilateral: Secondary | ICD-10-CM

## 2018-10-08 DIAGNOSIS — F0390 Unspecified dementia without behavioral disturbance: Secondary | ICD-10-CM | POA: Diagnosis not present

## 2018-10-08 DIAGNOSIS — S72332D Displaced oblique fracture of shaft of left femur, subsequent encounter for closed fracture with routine healing: Secondary | ICD-10-CM | POA: Diagnosis not present

## 2018-10-08 DIAGNOSIS — K219 Gastro-esophageal reflux disease without esophagitis: Secondary | ICD-10-CM

## 2018-10-08 DIAGNOSIS — G8929 Other chronic pain: Secondary | ICD-10-CM | POA: Diagnosis not present

## 2018-10-08 DIAGNOSIS — I2510 Atherosclerotic heart disease of native coronary artery without angina pectoris: Secondary | ICD-10-CM | POA: Diagnosis not present

## 2018-10-08 DIAGNOSIS — Z7982 Long term (current) use of aspirin: Secondary | ICD-10-CM

## 2018-10-08 DIAGNOSIS — M545 Low back pain: Secondary | ICD-10-CM | POA: Diagnosis not present

## 2018-10-08 DIAGNOSIS — R627 Adult failure to thrive: Secondary | ICD-10-CM

## 2018-10-08 DIAGNOSIS — Z7901 Long term (current) use of anticoagulants: Secondary | ICD-10-CM

## 2018-10-08 DIAGNOSIS — Z85038 Personal history of other malignant neoplasm of large intestine: Secondary | ICD-10-CM

## 2018-10-08 NOTE — Progress Notes (Signed)
Abstracted and sent to scan  

## 2018-10-11 DIAGNOSIS — D62 Acute posthemorrhagic anemia: Secondary | ICD-10-CM | POA: Diagnosis not present

## 2018-10-11 DIAGNOSIS — S72332D Displaced oblique fracture of shaft of left femur, subsequent encounter for closed fracture with routine healing: Secondary | ICD-10-CM | POA: Diagnosis not present

## 2018-10-13 DIAGNOSIS — S72332D Displaced oblique fracture of shaft of left femur, subsequent encounter for closed fracture with routine healing: Secondary | ICD-10-CM | POA: Diagnosis not present

## 2018-10-13 DIAGNOSIS — D62 Acute posthemorrhagic anemia: Secondary | ICD-10-CM | POA: Diagnosis not present

## 2018-10-19 DIAGNOSIS — S72332D Displaced oblique fracture of shaft of left femur, subsequent encounter for closed fracture with routine healing: Secondary | ICD-10-CM | POA: Diagnosis not present

## 2018-10-19 DIAGNOSIS — D62 Acute posthemorrhagic anemia: Secondary | ICD-10-CM | POA: Diagnosis not present

## 2018-10-20 DIAGNOSIS — S72332D Displaced oblique fracture of shaft of left femur, subsequent encounter for closed fracture with routine healing: Secondary | ICD-10-CM | POA: Diagnosis not present

## 2018-10-20 DIAGNOSIS — D62 Acute posthemorrhagic anemia: Secondary | ICD-10-CM | POA: Diagnosis not present

## 2018-10-21 DIAGNOSIS — S72332D Displaced oblique fracture of shaft of left femur, subsequent encounter for closed fracture with routine healing: Secondary | ICD-10-CM | POA: Diagnosis not present

## 2018-10-21 DIAGNOSIS — D62 Acute posthemorrhagic anemia: Secondary | ICD-10-CM | POA: Diagnosis not present

## 2018-10-27 DIAGNOSIS — D62 Acute posthemorrhagic anemia: Secondary | ICD-10-CM | POA: Diagnosis not present

## 2018-10-27 DIAGNOSIS — S72332D Displaced oblique fracture of shaft of left femur, subsequent encounter for closed fracture with routine healing: Secondary | ICD-10-CM | POA: Diagnosis not present

## 2018-11-02 DIAGNOSIS — D62 Acute posthemorrhagic anemia: Secondary | ICD-10-CM | POA: Diagnosis not present

## 2018-11-02 DIAGNOSIS — S72332D Displaced oblique fracture of shaft of left femur, subsequent encounter for closed fracture with routine healing: Secondary | ICD-10-CM | POA: Diagnosis not present

## 2018-11-03 DIAGNOSIS — S72332D Displaced oblique fracture of shaft of left femur, subsequent encounter for closed fracture with routine healing: Secondary | ICD-10-CM | POA: Diagnosis not present

## 2018-11-03 DIAGNOSIS — D62 Acute posthemorrhagic anemia: Secondary | ICD-10-CM | POA: Diagnosis not present

## 2018-11-09 DIAGNOSIS — S72332D Displaced oblique fracture of shaft of left femur, subsequent encounter for closed fracture with routine healing: Secondary | ICD-10-CM | POA: Diagnosis not present

## 2018-11-09 DIAGNOSIS — S7292XD Unspecified fracture of left femur, subsequent encounter for closed fracture with routine healing: Secondary | ICD-10-CM | POA: Diagnosis not present

## 2018-11-09 DIAGNOSIS — D62 Acute posthemorrhagic anemia: Secondary | ICD-10-CM | POA: Diagnosis not present

## 2018-11-10 DIAGNOSIS — L821 Other seborrheic keratosis: Secondary | ICD-10-CM | POA: Diagnosis not present

## 2018-11-10 DIAGNOSIS — D171 Benign lipomatous neoplasm of skin and subcutaneous tissue of trunk: Secondary | ICD-10-CM | POA: Diagnosis not present

## 2018-11-10 DIAGNOSIS — D62 Acute posthemorrhagic anemia: Secondary | ICD-10-CM | POA: Diagnosis not present

## 2018-11-10 DIAGNOSIS — S72332D Displaced oblique fracture of shaft of left femur, subsequent encounter for closed fracture with routine healing: Secondary | ICD-10-CM | POA: Diagnosis not present

## 2018-11-10 DIAGNOSIS — Z23 Encounter for immunization: Secondary | ICD-10-CM | POA: Diagnosis not present

## 2018-11-15 DIAGNOSIS — Z9181 History of falling: Secondary | ICD-10-CM | POA: Diagnosis not present

## 2018-11-15 DIAGNOSIS — Z79891 Long term (current) use of opiate analgesic: Secondary | ICD-10-CM | POA: Diagnosis not present

## 2018-11-15 DIAGNOSIS — H9193 Unspecified hearing loss, bilateral: Secondary | ICD-10-CM | POA: Diagnosis not present

## 2018-11-15 DIAGNOSIS — W19XXXD Unspecified fall, subsequent encounter: Secondary | ICD-10-CM | POA: Diagnosis not present

## 2018-11-15 DIAGNOSIS — E559 Vitamin D deficiency, unspecified: Secondary | ICD-10-CM | POA: Diagnosis not present

## 2018-11-15 DIAGNOSIS — G8929 Other chronic pain: Secondary | ICD-10-CM | POA: Diagnosis not present

## 2018-11-15 DIAGNOSIS — I2510 Atherosclerotic heart disease of native coronary artery without angina pectoris: Secondary | ICD-10-CM | POA: Diagnosis not present

## 2018-11-15 DIAGNOSIS — F0390 Unspecified dementia without behavioral disturbance: Secondary | ICD-10-CM | POA: Diagnosis not present

## 2018-11-15 DIAGNOSIS — M8580 Other specified disorders of bone density and structure, unspecified site: Secondary | ICD-10-CM | POA: Diagnosis not present

## 2018-11-15 DIAGNOSIS — I493 Ventricular premature depolarization: Secondary | ICD-10-CM | POA: Diagnosis not present

## 2018-11-15 DIAGNOSIS — Z85038 Personal history of other malignant neoplasm of large intestine: Secondary | ICD-10-CM | POA: Diagnosis not present

## 2018-11-15 DIAGNOSIS — Z7901 Long term (current) use of anticoagulants: Secondary | ICD-10-CM | POA: Diagnosis not present

## 2018-11-15 DIAGNOSIS — M545 Low back pain: Secondary | ICD-10-CM | POA: Diagnosis not present

## 2018-11-15 DIAGNOSIS — S72332D Displaced oblique fracture of shaft of left femur, subsequent encounter for closed fracture with routine healing: Secondary | ICD-10-CM | POA: Diagnosis not present

## 2018-11-15 DIAGNOSIS — R627 Adult failure to thrive: Secondary | ICD-10-CM | POA: Diagnosis not present

## 2018-11-15 DIAGNOSIS — Z7982 Long term (current) use of aspirin: Secondary | ICD-10-CM | POA: Diagnosis not present

## 2018-11-15 DIAGNOSIS — D62 Acute posthemorrhagic anemia: Secondary | ICD-10-CM | POA: Diagnosis not present

## 2018-11-15 DIAGNOSIS — Z96652 Presence of left artificial knee joint: Secondary | ICD-10-CM | POA: Diagnosis not present

## 2018-11-15 DIAGNOSIS — K219 Gastro-esophageal reflux disease without esophagitis: Secondary | ICD-10-CM | POA: Diagnosis not present

## 2018-11-15 DIAGNOSIS — Z8673 Personal history of transient ischemic attack (TIA), and cerebral infarction without residual deficits: Secondary | ICD-10-CM | POA: Diagnosis not present

## 2018-11-15 DIAGNOSIS — E785 Hyperlipidemia, unspecified: Secondary | ICD-10-CM | POA: Diagnosis not present

## 2018-11-15 DIAGNOSIS — F329 Major depressive disorder, single episode, unspecified: Secondary | ICD-10-CM | POA: Diagnosis not present

## 2018-11-16 ENCOUNTER — Other Ambulatory Visit: Payer: Self-pay

## 2018-11-17 ENCOUNTER — Ambulatory Visit (INDEPENDENT_AMBULATORY_CARE_PROVIDER_SITE_OTHER): Payer: Medicare Other | Admitting: Internal Medicine

## 2018-11-17 ENCOUNTER — Encounter: Payer: Self-pay | Admitting: Internal Medicine

## 2018-11-17 VITALS — BP 110/70 | HR 61 | Temp 97.5°F | Ht 59.0 in | Wt 112.0 lb

## 2018-11-17 DIAGNOSIS — R05 Cough: Secondary | ICD-10-CM | POA: Diagnosis not present

## 2018-11-17 DIAGNOSIS — I493 Ventricular premature depolarization: Secondary | ICD-10-CM | POA: Diagnosis not present

## 2018-11-17 DIAGNOSIS — I2510 Atherosclerotic heart disease of native coronary artery without angina pectoris: Secondary | ICD-10-CM | POA: Diagnosis not present

## 2018-11-17 DIAGNOSIS — G8929 Other chronic pain: Secondary | ICD-10-CM | POA: Diagnosis not present

## 2018-11-17 DIAGNOSIS — D62 Acute posthemorrhagic anemia: Secondary | ICD-10-CM | POA: Diagnosis not present

## 2018-11-17 DIAGNOSIS — R059 Cough, unspecified: Secondary | ICD-10-CM

## 2018-11-17 DIAGNOSIS — J300 Vasomotor rhinitis: Secondary | ICD-10-CM | POA: Insufficient documentation

## 2018-11-17 DIAGNOSIS — S72332D Displaced oblique fracture of shaft of left femur, subsequent encounter for closed fracture with routine healing: Secondary | ICD-10-CM | POA: Diagnosis not present

## 2018-11-17 DIAGNOSIS — M545 Low back pain: Secondary | ICD-10-CM | POA: Diagnosis not present

## 2018-11-17 MED ORDER — ALBUTEROL SULFATE HFA 108 (90 BASE) MCG/ACT IN AERS: INHALATION_SPRAY | RESPIRATORY_TRACT | 4 refills | Status: DC

## 2018-11-17 NOTE — Assessment & Plan Note (Signed)
Talked about possible atrovent nasal spray but they will continue with flonase for now. She is doing well overall and sleeping okay. Does not have any congestion or pneumonia in the lungs. Albuterol prn refilled today.

## 2018-11-17 NOTE — Progress Notes (Signed)
   Subjective:    Patient ID: [name] Barker, female    DOB: Apr 12, 1921, 84 y.o.   MRN: [id number]  HPI The patient is a 84 YO female coming in for cough at night time. She is having nose drainage still. She is using flonase daily recently and this has seemed to help some. She denies fevers or chills. Denies SOB or cough during the day. She gets a lot of clear nose drainage in the evening if she gets up. She denies ear pain or sinus pressure. She does have hearing aids.   Review of Systems  Constitutional: Positive for activity change. Negative for appetite change, chills, fatigue, fever and unexpected weight change.  HENT: Positive for rhinorrhea. Negative for congestion, ear discharge, ear pain, postnasal drip, sinus pressure, sinus pain, sneezing, sore throat, tinnitus, trouble swallowing and voice change.   Eyes: Negative.   Respiratory: Positive for cough. Negative for chest tightness, shortness of breath and wheezing.   Cardiovascular: Negative.   Gastrointestinal: Negative.   Musculoskeletal: Positive for arthralgias, gait problem and myalgias.      Objective:   Physical Exam  Constitutional: She is oriented to person, place, and time. She appears well-developed and well-nourished.  HENT:  Head: Normocephalic and atraumatic.  Mild redness oropharynx with some clear drainage  Eyes: EOM are normal.  Neck: Normal range of motion.  Cardiovascular: Normal rate and regular rhythm.  Pulmonary/Chest: Effort normal and breath sounds normal. No respiratory distress. She has no wheezes. She has no rales.  Abdominal: Soft. Bowel sounds are normal. She exhibits no distension. There is no tenderness. There is no rebound.  Musculoskeletal: She exhibits no edema.  Neurological: She is alert and oriented to person, place, and time. Coordination abnormal.  walker  Skin: Skin is warm and dry.   Vitals:   11/17/18 1006  BP: 110/70  Pulse: 61  Temp: (!) 97.5 F (36.4 C)  TempSrc: Oral    SpO2: 94%  Weight: 112 lb (50.8 kg)  Height: 4\' 11"  (1.499 m)      Assessment & Plan:

## 2018-11-17 NOTE — Patient Instructions (Signed)
You can try the lidocaine patches for the pain.

## 2018-11-22 DIAGNOSIS — I2510 Atherosclerotic heart disease of native coronary artery without angina pectoris: Secondary | ICD-10-CM | POA: Diagnosis not present

## 2018-11-22 DIAGNOSIS — S72332D Displaced oblique fracture of shaft of left femur, subsequent encounter for closed fracture with routine healing: Secondary | ICD-10-CM | POA: Diagnosis not present

## 2018-11-22 DIAGNOSIS — M545 Low back pain: Secondary | ICD-10-CM | POA: Diagnosis not present

## 2018-11-22 DIAGNOSIS — I493 Ventricular premature depolarization: Secondary | ICD-10-CM | POA: Diagnosis not present

## 2018-11-22 DIAGNOSIS — D62 Acute posthemorrhagic anemia: Secondary | ICD-10-CM | POA: Diagnosis not present

## 2018-11-22 DIAGNOSIS — G8929 Other chronic pain: Secondary | ICD-10-CM | POA: Diagnosis not present

## 2018-11-23 DIAGNOSIS — M545 Low back pain: Secondary | ICD-10-CM | POA: Diagnosis not present

## 2018-11-23 DIAGNOSIS — I493 Ventricular premature depolarization: Secondary | ICD-10-CM | POA: Diagnosis not present

## 2018-11-23 DIAGNOSIS — G8929 Other chronic pain: Secondary | ICD-10-CM | POA: Diagnosis not present

## 2018-11-23 DIAGNOSIS — S72332D Displaced oblique fracture of shaft of left femur, subsequent encounter for closed fracture with routine healing: Secondary | ICD-10-CM | POA: Diagnosis not present

## 2018-11-23 DIAGNOSIS — D62 Acute posthemorrhagic anemia: Secondary | ICD-10-CM | POA: Diagnosis not present

## 2018-11-23 DIAGNOSIS — I2510 Atherosclerotic heart disease of native coronary artery without angina pectoris: Secondary | ICD-10-CM | POA: Diagnosis not present

## 2018-11-29 DIAGNOSIS — M545 Low back pain: Secondary | ICD-10-CM | POA: Diagnosis not present

## 2018-11-29 DIAGNOSIS — I493 Ventricular premature depolarization: Secondary | ICD-10-CM | POA: Diagnosis not present

## 2018-11-29 DIAGNOSIS — I2510 Atherosclerotic heart disease of native coronary artery without angina pectoris: Secondary | ICD-10-CM | POA: Diagnosis not present

## 2018-11-29 DIAGNOSIS — G8929 Other chronic pain: Secondary | ICD-10-CM | POA: Diagnosis not present

## 2018-11-29 DIAGNOSIS — S72332D Displaced oblique fracture of shaft of left femur, subsequent encounter for closed fracture with routine healing: Secondary | ICD-10-CM | POA: Diagnosis not present

## 2018-11-29 DIAGNOSIS — D62 Acute posthemorrhagic anemia: Secondary | ICD-10-CM | POA: Diagnosis not present

## 2018-12-01 DIAGNOSIS — G8929 Other chronic pain: Secondary | ICD-10-CM | POA: Diagnosis not present

## 2018-12-01 DIAGNOSIS — I2510 Atherosclerotic heart disease of native coronary artery without angina pectoris: Secondary | ICD-10-CM | POA: Diagnosis not present

## 2018-12-01 DIAGNOSIS — I493 Ventricular premature depolarization: Secondary | ICD-10-CM | POA: Diagnosis not present

## 2018-12-01 DIAGNOSIS — D62 Acute posthemorrhagic anemia: Secondary | ICD-10-CM | POA: Diagnosis not present

## 2018-12-01 DIAGNOSIS — S72332D Displaced oblique fracture of shaft of left femur, subsequent encounter for closed fracture with routine healing: Secondary | ICD-10-CM | POA: Diagnosis not present

## 2018-12-01 DIAGNOSIS — M545 Low back pain: Secondary | ICD-10-CM | POA: Diagnosis not present

## 2018-12-06 DIAGNOSIS — G8929 Other chronic pain: Secondary | ICD-10-CM | POA: Diagnosis not present

## 2018-12-06 DIAGNOSIS — D62 Acute posthemorrhagic anemia: Secondary | ICD-10-CM | POA: Diagnosis not present

## 2018-12-06 DIAGNOSIS — I493 Ventricular premature depolarization: Secondary | ICD-10-CM | POA: Diagnosis not present

## 2018-12-06 DIAGNOSIS — I2510 Atherosclerotic heart disease of native coronary artery without angina pectoris: Secondary | ICD-10-CM | POA: Diagnosis not present

## 2018-12-06 DIAGNOSIS — M545 Low back pain: Secondary | ICD-10-CM | POA: Diagnosis not present

## 2018-12-06 DIAGNOSIS — S72332D Displaced oblique fracture of shaft of left femur, subsequent encounter for closed fracture with routine healing: Secondary | ICD-10-CM | POA: Diagnosis not present

## 2018-12-08 ENCOUNTER — Ambulatory Visit (INDEPENDENT_AMBULATORY_CARE_PROVIDER_SITE_OTHER): Payer: Medicare Other | Admitting: Podiatry

## 2018-12-08 DIAGNOSIS — M79675 Pain in left toe(s): Secondary | ICD-10-CM | POA: Diagnosis not present

## 2018-12-08 DIAGNOSIS — M79674 Pain in right toe(s): Secondary | ICD-10-CM

## 2018-12-08 DIAGNOSIS — B351 Tinea unguium: Secondary | ICD-10-CM | POA: Diagnosis not present

## 2018-12-08 NOTE — Patient Instructions (Signed)

## 2018-12-09 ENCOUNTER — Other Ambulatory Visit: Payer: Self-pay | Admitting: Internal Medicine

## 2018-12-09 DIAGNOSIS — D62 Acute posthemorrhagic anemia: Secondary | ICD-10-CM | POA: Diagnosis not present

## 2018-12-09 DIAGNOSIS — M545 Low back pain: Secondary | ICD-10-CM | POA: Diagnosis not present

## 2018-12-09 DIAGNOSIS — I2510 Atherosclerotic heart disease of native coronary artery without angina pectoris: Secondary | ICD-10-CM | POA: Diagnosis not present

## 2018-12-09 DIAGNOSIS — G8929 Other chronic pain: Secondary | ICD-10-CM | POA: Diagnosis not present

## 2018-12-09 DIAGNOSIS — I493 Ventricular premature depolarization: Secondary | ICD-10-CM | POA: Diagnosis not present

## 2018-12-09 DIAGNOSIS — S72332D Displaced oblique fracture of shaft of left femur, subsequent encounter for closed fracture with routine healing: Secondary | ICD-10-CM | POA: Diagnosis not present

## 2018-12-15 DIAGNOSIS — I2510 Atherosclerotic heart disease of native coronary artery without angina pectoris: Secondary | ICD-10-CM | POA: Diagnosis not present

## 2018-12-15 DIAGNOSIS — M545 Low back pain: Secondary | ICD-10-CM | POA: Diagnosis not present

## 2018-12-15 DIAGNOSIS — D62 Acute posthemorrhagic anemia: Secondary | ICD-10-CM | POA: Diagnosis not present

## 2018-12-15 DIAGNOSIS — S72332D Displaced oblique fracture of shaft of left femur, subsequent encounter for closed fracture with routine healing: Secondary | ICD-10-CM | POA: Diagnosis not present

## 2018-12-15 DIAGNOSIS — I493 Ventricular premature depolarization: Secondary | ICD-10-CM | POA: Diagnosis not present

## 2018-12-15 DIAGNOSIS — G8929 Other chronic pain: Secondary | ICD-10-CM | POA: Diagnosis not present

## 2018-12-20 DIAGNOSIS — D62 Acute posthemorrhagic anemia: Secondary | ICD-10-CM | POA: Diagnosis not present

## 2018-12-20 DIAGNOSIS — S72332D Displaced oblique fracture of shaft of left femur, subsequent encounter for closed fracture with routine healing: Secondary | ICD-10-CM | POA: Diagnosis not present

## 2018-12-20 DIAGNOSIS — M545 Low back pain: Secondary | ICD-10-CM | POA: Diagnosis not present

## 2018-12-20 DIAGNOSIS — I2510 Atherosclerotic heart disease of native coronary artery without angina pectoris: Secondary | ICD-10-CM | POA: Diagnosis not present

## 2018-12-20 DIAGNOSIS — G8929 Other chronic pain: Secondary | ICD-10-CM | POA: Diagnosis not present

## 2018-12-20 DIAGNOSIS — I493 Ventricular premature depolarization: Secondary | ICD-10-CM | POA: Diagnosis not present

## 2018-12-23 DIAGNOSIS — M545 Low back pain: Secondary | ICD-10-CM | POA: Diagnosis not present

## 2018-12-23 DIAGNOSIS — S72332D Displaced oblique fracture of shaft of left femur, subsequent encounter for closed fracture with routine healing: Secondary | ICD-10-CM | POA: Diagnosis not present

## 2018-12-23 DIAGNOSIS — I2510 Atherosclerotic heart disease of native coronary artery without angina pectoris: Secondary | ICD-10-CM | POA: Diagnosis not present

## 2018-12-23 DIAGNOSIS — D62 Acute posthemorrhagic anemia: Secondary | ICD-10-CM | POA: Diagnosis not present

## 2018-12-23 DIAGNOSIS — I493 Ventricular premature depolarization: Secondary | ICD-10-CM | POA: Diagnosis not present

## 2018-12-23 DIAGNOSIS — G8929 Other chronic pain: Secondary | ICD-10-CM | POA: Diagnosis not present

## 2018-12-28 DIAGNOSIS — I493 Ventricular premature depolarization: Secondary | ICD-10-CM | POA: Diagnosis not present

## 2018-12-28 DIAGNOSIS — M545 Low back pain: Secondary | ICD-10-CM | POA: Diagnosis not present

## 2018-12-28 DIAGNOSIS — D62 Acute posthemorrhagic anemia: Secondary | ICD-10-CM | POA: Diagnosis not present

## 2018-12-28 DIAGNOSIS — I2510 Atherosclerotic heart disease of native coronary artery without angina pectoris: Secondary | ICD-10-CM | POA: Diagnosis not present

## 2018-12-28 DIAGNOSIS — S72332D Displaced oblique fracture of shaft of left femur, subsequent encounter for closed fracture with routine healing: Secondary | ICD-10-CM | POA: Diagnosis not present

## 2018-12-28 DIAGNOSIS — G8929 Other chronic pain: Secondary | ICD-10-CM | POA: Diagnosis not present

## 2018-12-30 DIAGNOSIS — S72332D Displaced oblique fracture of shaft of left femur, subsequent encounter for closed fracture with routine healing: Secondary | ICD-10-CM | POA: Diagnosis not present

## 2018-12-30 DIAGNOSIS — M545 Low back pain: Secondary | ICD-10-CM | POA: Diagnosis not present

## 2018-12-30 DIAGNOSIS — I493 Ventricular premature depolarization: Secondary | ICD-10-CM | POA: Diagnosis not present

## 2018-12-30 DIAGNOSIS — I2510 Atherosclerotic heart disease of native coronary artery without angina pectoris: Secondary | ICD-10-CM | POA: Diagnosis not present

## 2018-12-30 DIAGNOSIS — D62 Acute posthemorrhagic anemia: Secondary | ICD-10-CM | POA: Diagnosis not present

## 2018-12-30 DIAGNOSIS — G8929 Other chronic pain: Secondary | ICD-10-CM | POA: Diagnosis not present

## 2019-01-03 DIAGNOSIS — D62 Acute posthemorrhagic anemia: Secondary | ICD-10-CM | POA: Diagnosis not present

## 2019-01-03 DIAGNOSIS — I493 Ventricular premature depolarization: Secondary | ICD-10-CM | POA: Diagnosis not present

## 2019-01-03 DIAGNOSIS — M545 Low back pain: Secondary | ICD-10-CM | POA: Diagnosis not present

## 2019-01-03 DIAGNOSIS — G8929 Other chronic pain: Secondary | ICD-10-CM | POA: Diagnosis not present

## 2019-01-03 DIAGNOSIS — S72332D Displaced oblique fracture of shaft of left femur, subsequent encounter for closed fracture with routine healing: Secondary | ICD-10-CM | POA: Diagnosis not present

## 2019-01-03 DIAGNOSIS — I2510 Atherosclerotic heart disease of native coronary artery without angina pectoris: Secondary | ICD-10-CM | POA: Diagnosis not present

## 2019-01-06 DIAGNOSIS — I2510 Atherosclerotic heart disease of native coronary artery without angina pectoris: Secondary | ICD-10-CM | POA: Diagnosis not present

## 2019-01-06 DIAGNOSIS — M545 Low back pain: Secondary | ICD-10-CM | POA: Diagnosis not present

## 2019-01-06 DIAGNOSIS — D62 Acute posthemorrhagic anemia: Secondary | ICD-10-CM | POA: Diagnosis not present

## 2019-01-06 DIAGNOSIS — I493 Ventricular premature depolarization: Secondary | ICD-10-CM | POA: Diagnosis not present

## 2019-01-06 DIAGNOSIS — G8929 Other chronic pain: Secondary | ICD-10-CM | POA: Diagnosis not present

## 2019-01-06 DIAGNOSIS — S72332D Displaced oblique fracture of shaft of left femur, subsequent encounter for closed fracture with routine healing: Secondary | ICD-10-CM | POA: Diagnosis not present

## 2019-01-08 ENCOUNTER — Encounter: Payer: Self-pay | Admitting: Podiatry

## 2019-01-08 NOTE — Progress Notes (Signed)
Subjective: [name] Barker presents today accompanied by her daughter and husband,  referred by Hoyt Koch, MD for discolored, thick toenails which interfere with daily activities.  Pain is aggravated when wearing enclosed shoe gear.    Past Medical History:  Diagnosis Date  . Allergic rhinitis   . ANEMIA-NOS 11/17/2006  . Colon cancer ([place]) 08/19/2007   1994 T2, 2008 T3, N1  . DIVERTICULOSIS-COLON 05/30/2010  . Femur fracture, left ([place]) 09/13/2018   CLOSED  . GERD 11/17/2006  . Hemorrhoids   . HYPERLIPIDEMIA 11/17/2006  . LOW BACK PAIN, CHRONIC 02/04/2010  . Osteopenia 11/17/2006  . Small bowel obstruction ([place])   . TIA (transient ischemic attack)      Patient Active Problem List   Diagnosis Date Noted  . Vasomotor rhinitis 11/17/2018  . Closed fracture of left femur, initial encounter ([place]) 09/13/2018  . Hypokalemia   . Bilateral impacted cerumen 10/30/2017  . Bronchitis 09/21/2017  . Irregular heartbeat 09/21/2017  . Mild dementia ([place]) 08/10/2017  . Sensorineural hearing loss (SNHL), bilateral 08/08/2016  . Bilateral hearing loss 08/07/2016  . Depression 08/07/2016  . Pre-syncope 02/01/2016  . Vitamin D deficiency 11/04/2015  . Fatigue 11/04/2015  . Routine general medical examination at a health care facility 06/08/2015  . TIA (transient ischemic attack) 04/04/2012  . CAD 06/21/2010  . LOW BACK PAIN, CHRONIC 02/04/2010  . ADENOCARCINOMA, COLON 08/19/2007  . HYPERLIPIDEMIA 11/17/2006  . ANEMIA-NOS 11/17/2006  . GERD 11/17/2006  . OSTEOPENIA 11/17/2006     Past Surgical History:  Procedure Laterality Date  . ABDOMINAL HYSTERECTOMY    . APPENDECTOMY    . BACK SURGERY  2007  . BELPHAROPTOSIS REPAIR    . CATARACT EXTRACTION    . HEMICOLECTOMY  1994   Right  . JOINT REPLACEMENT    . KNEE ARTHROSCOPY    . ORIF FEMUR FRACTURE Left 09/13/2018   Procedure: OPEN REDUCTION INTERNAL FIXATION (ORIF) DISTAL FEMUR FRACTURE;  Surgeon: Shona Needles, MD;   Location: [facility];  Service: Orthopedics;  Laterality: Left;  . PARTIAL COLECTOMY  2008  . ROTATOR CUFF REPAIR    . SHOULDER SURGERY    . VAGINAL PROLAPSE REPAIR        Current Outpatient Medications:  .  acetaminophen (TYLENOL) 500 MG tablet, Take 500 mg by mouth every 6 (six) hours as needed for mild pain., Disp: , Rfl:  .  albuterol (PROAIR HFA) 108 (90 Base) MCG/ACT inhaler, INHALE 2 PUFFS INTO THE LUNGS 2 (TWO) TIMES DAILY AS NEEDED. FOR WHEEZING, Disp: 8.5 Inhaler, Rfl: 4 .  aspirin 81 MG EC tablet, TAKE 1 TABLET (81 MG TOTAL) BY MOUTH DAILY. RESUME AFTER FINISHING LOVENOX INJECTION, Disp: , Rfl: 0 .  aspirin 81 MG tablet, Take 1 tablet (81 mg total) by mouth daily. Resume after finishing lovenox injection, Disp: 30 tablet, Rfl: 0 .  Cholecalciferol (VITAMIN D) 2000 UNITS tablet, Take by mouth daily. 5,000 IU daily, Disp: , Rfl:  .  glycerin adult (GLYCERIN ADULT) 2 G SUPP, Place 1 suppository rectally daily. , Disp: , Rfl:  .  ibuprofen (ADVIL,MOTRIN) 200 MG tablet, Take 200 mg by mouth every 6 (six) hours as needed., Disp: , Rfl:  .  Multiple Vitamin (MULTIVITAMIN WITH MINERALS) TABS tablet, Take 1 tablet by mouth daily., Disp: 30 tablet, Rfl: 0 .  senna-docusate (SENOKOT-S) 8.6-50 MG per tablet, Take 1 tablet by mouth at bedtime as needed for mild constipation. , Disp: , Rfl:  .  VOLTAREN 1 %  GEL, APPLY TO AFFECTED AREA EVERY DAY AS NEEDED BACK PAIN (Patient taking differently: Apply 2 g topically daily as needed (back pain). ), Disp: 400 g, Rfl: 5 .  citalopram (CELEXA) 10 MG tablet, TAKE 1 TABLET BY MOUTH EVERY DAY, Disp: 90 tablet, Rfl: 0   Allergies  Allergen Reactions  . Fish-Derived Products Other (See Comments)    Muscle pain   . Flexeril [Cyclobenzaprine]     nightmares  . Methenamine Mandelate     REACTION: burning sensation  . Methenamine Mandelate Other (See Comments)    REACTION: burning sensation  . Morphine Sulfate     REACTION: blister  . Penicillins      REACTION: urticaria (hives)  . Promethazine Hcl     REACTION: hallucinations  . Statins Other (See Comments)    Muscle pain      Social History   Occupational History  . Not on file  Tobacco Use  . Smoking status: Never Smoker  . Smokeless tobacco: Never Used  Substance and Sexual Activity  . Alcohol use: No  . Drug use: No  . Sexual activity: Not on file    Comment: not asked     Family History  Problem Relation Age of Onset  . Lung cancer Sister   . Colon cancer Sister 68  . Kidney cancer Sister      Immunization History  Administered Date(s) Administered  . Influenza Split 09/29/2011  . Influenza Whole 10/03/2008, 09/28/2012  . Influenza, High Dose Seasonal PF 09/14/2018  . Influenza,inj,Quad PF,6+ Mos 09/12/2013  . Influenza,trivalent, recombinat, inj, PF 09/26/2013  . Influenza-Unspecified 09/28/2014, 10/13/2015, 09/25/2017, 09/14/2018  . Pneumococcal Conjugate-13 06/07/2015  . Pneumococcal Polysaccharide-23 10/29/2006  . Td 12/29/1994  . Tdap 07/18/2011  . Zoster 08/25/2011     Review of systems: Positive Findings in bold print.  Constitutional:  chills, fatigue, fever, sweats, weight change Communication: Optometrist, sign Ecologist, hand writing, iPad/Android device Head: headaches, head injury Eyes: changes in vision, eye pain, glaucoma, cataracts, macular degeneration, diplopia, glare, light sensitivity, eyeglasses or contacts, blindness Ears nose mouth throat: Hard of hearing, ringing in ears, deaf, sign language,  vertigo,   nosebleeds,  rhinitis,  cold sores, snoring, swollen glands Cardiovascular: HTN, edema, arrhythmia, pacemaker in place, defibrillator in place,  chest pain/tightness, chronic anticoagulation, blood clot, heart failure Peripheral Vascular: leg cramps, varicose veins, blood clots, lymphedema Respiratory:  difficulty breathing, denies congestion, SOB, wheezing, cough, emphysema Gastrointestinal: change in appetite or weight,  abdominal pain, constipation, diarrhea, nausea, vomiting, vomiting blood, change in bowel habits, abdominal pain, jaundice, rectal bleeding, hemorrhoids, Genitourinary:  nocturia,  pain on urination,  blood in urine, Foley catheter, urinary urgency Musculoskeletal: uses mobility aid,  cramping, stiff joints, painful joints, decreased joint motion, fractures, OA, gout, back pain Skin: +changes in toenails, color change, dryness, itching, mole changes,  rash  Neurological: headaches, numbness in feet, paresthesias in feet, burning in feet, fainting,  seizures, change in speech. denies headaches, memory problems/poor historian, cerebral palsy, weakness, paralysis, TIA Endocrine: diabetes, hypothyroidism, hyperthyroidism,  goiter, dry mouth, flushing, heat intolerance,  cold intolerance,  excessive thirst, denies polyuria,  nocturia Hematological:  easy bleeding, excessive bleeding, easy bruising, enlarged lymph nodes, on long term blood thinner, history of past transusions Allergy/immunological:  hives, eczema, frequent infections, multiple drug allergies, seasonal allergies, transplant recipient Psychiatric:  anxiety, depression, mood disorder, suicidal ideations, hallucinations   Objective: Vascular Examination: Capillary refill time immediate x 10 digits Dorsalis pedis and posterior tibial pulses present b/l No  digital hair x 10 digits Skin temperature gradient WNL b/l  Dermatological Examination: Skin with normal turgor, texture and tone b/l  Toenails 1-5 b/l discolored, thick, dystrophic with subungual debris and pain with palpation to nailbeds due to thickness of nails.  Musculoskeletal: Muscle strength 5/5 to all LE muscle groups  Neurological: Sensation intact with 10 gram monofilament Vibratory sensation intact.  Assessment: 1. Painful onychomycosis toenails 1-5 b/l    Plan: 1. Discussed diagnoses with patient and daughter. Literature dispensed on today. 2. Toenails 1-5 b/l  were debrided in length and girth without iatrogenic bleeding. 3. Patient to continue soft, supportive shoe gear 4. Patient to report any pedal injuries to medical professional immediately. 5. Follow up 3 months. Patient/POA to call should there be a concern in the interim.

## 2019-01-11 DIAGNOSIS — D62 Acute posthemorrhagic anemia: Secondary | ICD-10-CM | POA: Diagnosis not present

## 2019-01-11 DIAGNOSIS — S72332D Displaced oblique fracture of shaft of left femur, subsequent encounter for closed fracture with routine healing: Secondary | ICD-10-CM | POA: Diagnosis not present

## 2019-01-11 DIAGNOSIS — I2510 Atherosclerotic heart disease of native coronary artery without angina pectoris: Secondary | ICD-10-CM | POA: Diagnosis not present

## 2019-01-11 DIAGNOSIS — M545 Low back pain: Secondary | ICD-10-CM | POA: Diagnosis not present

## 2019-01-11 DIAGNOSIS — G8929 Other chronic pain: Secondary | ICD-10-CM | POA: Diagnosis not present

## 2019-01-11 DIAGNOSIS — I493 Ventricular premature depolarization: Secondary | ICD-10-CM | POA: Diagnosis not present

## 2019-01-12 DIAGNOSIS — S7292XD Unspecified fracture of left femur, subsequent encounter for closed fracture with routine healing: Secondary | ICD-10-CM | POA: Diagnosis not present

## 2019-01-12 DIAGNOSIS — M1711 Unilateral primary osteoarthritis, right knee: Secondary | ICD-10-CM | POA: Diagnosis not present

## 2019-03-09 ENCOUNTER — Ambulatory Visit: Payer: Medicare Other | Admitting: Podiatry

## 2019-03-18 ENCOUNTER — Other Ambulatory Visit: Payer: Self-pay | Admitting: Internal Medicine

## 2019-03-31 ENCOUNTER — Emergency Department (HOSPITAL_COMMUNITY): Payer: Medicare Other

## 2019-03-31 ENCOUNTER — Inpatient Hospital Stay (HOSPITAL_COMMUNITY)
Admission: EM | Admit: 2019-03-31 | Discharge: 2019-04-01 | DRG: 063 | Disposition: A | Discharge status: Home Health | Payer: Medicare Other | Attending: Neurology | Admitting: Neurology

## 2019-03-31 DIAGNOSIS — Z791 Long term (current) use of non-steroidal anti-inflammatories (NSAID): Secondary | ICD-10-CM | POA: Diagnosis not present

## 2019-03-31 DIAGNOSIS — R2990 Unspecified symptoms and signs involving the nervous system: Secondary | ICD-10-CM

## 2019-03-31 DIAGNOSIS — R29810 Facial weakness: Secondary | ICD-10-CM | POA: Diagnosis present

## 2019-03-31 DIAGNOSIS — Z91013 Allergy to seafood: Secondary | ICD-10-CM | POA: Diagnosis not present

## 2019-03-31 DIAGNOSIS — I6523 Occlusion and stenosis of bilateral carotid arteries: Secondary | ICD-10-CM | POA: Diagnosis present

## 2019-03-31 DIAGNOSIS — Z880 Allergy status to penicillin: Secondary | ICD-10-CM | POA: Diagnosis not present

## 2019-03-31 DIAGNOSIS — I6501 Occlusion and stenosis of right vertebral artery: Secondary | ICD-10-CM | POA: Diagnosis present

## 2019-03-31 DIAGNOSIS — H903 Sensorineural hearing loss, bilateral: Secondary | ICD-10-CM | POA: Diagnosis present

## 2019-03-31 DIAGNOSIS — I639 Cerebral infarction, unspecified: Secondary | ICD-10-CM | POA: Diagnosis not present

## 2019-03-31 DIAGNOSIS — Z8673 Personal history of transient ischemic attack (TIA), and cerebral infarction without residual deficits: Secondary | ICD-10-CM | POA: Diagnosis present

## 2019-03-31 DIAGNOSIS — G3184 Mild cognitive impairment, so stated: Secondary | ICD-10-CM | POA: Diagnosis present

## 2019-03-31 DIAGNOSIS — G459 Transient cerebral ischemic attack, unspecified: Secondary | ICD-10-CM | POA: Diagnosis not present

## 2019-03-31 DIAGNOSIS — I63412 Cerebral infarction due to embolism of left middle cerebral artery: Secondary | ICD-10-CM

## 2019-03-31 DIAGNOSIS — I10 Essential (primary) hypertension: Secondary | ICD-10-CM | POA: Diagnosis not present

## 2019-03-31 DIAGNOSIS — Z888 Allergy status to other drugs, medicaments and biological substances status: Secondary | ICD-10-CM

## 2019-03-31 DIAGNOSIS — Z90710 Acquired absence of both cervix and uterus: Secondary | ICD-10-CM | POA: Diagnosis not present

## 2019-03-31 DIAGNOSIS — Z85038 Personal history of other malignant neoplasm of large intestine: Secondary | ICD-10-CM | POA: Diagnosis not present

## 2019-03-31 DIAGNOSIS — R4701 Aphasia: Secondary | ICD-10-CM

## 2019-03-31 DIAGNOSIS — Z801 Family history of malignant neoplasm of trachea, bronchus and lung: Secondary | ICD-10-CM | POA: Diagnosis not present

## 2019-03-31 DIAGNOSIS — Z8051 Family history of malignant neoplasm of kidney: Secondary | ICD-10-CM

## 2019-03-31 DIAGNOSIS — Z9049 Acquired absence of other specified parts of digestive tract: Secondary | ICD-10-CM | POA: Diagnosis not present

## 2019-03-31 DIAGNOSIS — R29704 NIHSS score 4: Secondary | ICD-10-CM | POA: Diagnosis present

## 2019-03-31 DIAGNOSIS — K219 Gastro-esophageal reflux disease without esophagitis: Secondary | ICD-10-CM | POA: Diagnosis present

## 2019-03-31 DIAGNOSIS — E785 Hyperlipidemia, unspecified: Secondary | ICD-10-CM | POA: Diagnosis present

## 2019-03-31 DIAGNOSIS — Z7982 Long term (current) use of aspirin: Secondary | ICD-10-CM | POA: Diagnosis not present

## 2019-03-31 DIAGNOSIS — Z885 Allergy status to narcotic agent status: Secondary | ICD-10-CM | POA: Diagnosis not present

## 2019-03-31 DIAGNOSIS — R4781 Slurred speech: Secondary | ICD-10-CM | POA: Diagnosis present

## 2019-03-31 DIAGNOSIS — F0390 Unspecified dementia without behavioral disturbance: Secondary | ICD-10-CM | POA: Diagnosis present

## 2019-03-31 DIAGNOSIS — M8580 Other specified disorders of bone density and structure, unspecified site: Secondary | ICD-10-CM | POA: Diagnosis present

## 2019-03-31 DIAGNOSIS — Z800 Family history of malignant neoplasm of digestive organs: Secondary | ICD-10-CM

## 2019-03-31 DIAGNOSIS — I739 Peripheral vascular disease, unspecified: Secondary | ICD-10-CM | POA: Diagnosis present

## 2019-03-31 DIAGNOSIS — Z79899 Other long term (current) drug therapy: Secondary | ICD-10-CM

## 2019-03-31 DIAGNOSIS — R531 Weakness: Secondary | ICD-10-CM | POA: Diagnosis not present

## 2019-03-31 DIAGNOSIS — R4182 Altered mental status, unspecified: Secondary | ICD-10-CM | POA: Diagnosis not present

## 2019-03-31 LAB — DIFFERENTIAL
Abs Immature Granulocytes: 0.02 10*3/uL (ref 0.00–0.07)
Basophils Absolute: 0 10*3/uL (ref 0.0–0.1)
Basophils Relative: 1 %
Eosinophils Absolute: 0.2 10*3/uL (ref 0.0–0.5)
Eosinophils Relative: 2 %
Immature Granulocytes: 0 %
Lymphocytes Relative: 39 %
Lymphs Abs: 3.3 10*3/uL (ref 0.7–4.0)
Monocytes Absolute: 1.1 10*3/uL — ABNORMAL HIGH (ref 0.1–1.0)
Monocytes Relative: 13 %
Neutro Abs: 3.8 10*3/uL (ref 1.7–7.7)
Neutrophils Relative %: 45 %

## 2019-03-31 LAB — CBC
HCT: 38.3 % (ref 36.0–46.0)
Hemoglobin: 11.5 g/dL — ABNORMAL LOW (ref 12.0–15.0)
MCH: 28.6 pg (ref 26.0–34.0)
MCHC: 30 g/dL (ref 30.0–36.0)
MCV: 95.3 fL (ref 80.0–100.0)
Platelets: 236 10*3/uL (ref 150–400)
RBC: 4.02 MIL/uL (ref 3.87–5.11)
RDW: 13.2 % (ref 11.5–15.5)
WBC: 8.4 10*3/uL (ref 4.0–10.5)
nRBC: 0 % (ref 0.0–0.2)

## 2019-03-31 LAB — COMPREHENSIVE METABOLIC PANEL
ALT: 11 U/L (ref 0–44)
AST: 27 U/L (ref 15–41)
Albumin: 3.6 g/dL (ref 3.5–5.0)
Alkaline Phosphatase: 73 U/L (ref 38–126)
Anion gap: 8 (ref 5–15)
BUN: 15 mg/dL (ref 8–23)
CO2: 29 mmol/L (ref 22–32)
Calcium: 8.8 mg/dL — ABNORMAL LOW (ref 8.9–10.3)
Chloride: 99 mmol/L (ref 98–111)
Creatinine, Ser: 0.59 mg/dL (ref 0.44–1.00)
GFR calc Af Amer: >60 mL/min (ref >60)
GFR calc non Af Amer: >60 mL/min (ref >60)
Glucose, Bld: 94 mg/dL (ref 70–99)
Potassium: 4.1 mmol/L (ref 3.5–5.1)
Sodium: 136 mmol/L (ref 135–145)
Total Bilirubin: 0.5 mg/dL (ref 0.3–1.2)
Total Protein: 6.7 g/dL (ref 6.5–8.1)

## 2019-03-31 LAB — I-STAT CREATININE, ED: Creatinine, Ser: 0.5 mg/dL (ref 0.44–1.00)

## 2019-03-31 LAB — POCT I-STAT EG7
Acid-Base Excess: 6 mmol/L — ABNORMAL HIGH (ref 0.0–2.0)
Bicarbonate: 29.4 mmol/L — ABNORMAL HIGH (ref 20.0–28.0)
Calcium, Ion: 0.93 mmol/L — ABNORMAL LOW (ref 1.15–1.40)
HCT: 35 % — ABNORMAL LOW (ref 36.0–46.0)
Hemoglobin: 11.9 g/dL — ABNORMAL LOW (ref 12.0–15.0)
O2 Saturation: 73 %
Potassium: 4.2 mmol/L (ref 3.5–5.1)
Sodium: 134 mmol/L — ABNORMAL LOW (ref 135–145)
TCO2: 31 mmol/L (ref 22–32)
pCO2, Ven: 38.3 mmHg — ABNORMAL LOW (ref 44.0–60.0)
pH, Ven: 7.492 — ABNORMAL HIGH (ref 7.250–7.430)
pO2, Ven: 35 mmHg (ref 32.0–45.0)

## 2019-03-31 LAB — APTT: aPTT: 27 seconds (ref 24–36)

## 2019-03-31 LAB — MRSA PCR SCREENING: MRSA by PCR: NEGATIVE

## 2019-03-31 LAB — PROTIME-INR
INR: 1 (ref 0.8–1.2)
Prothrombin Time: 13 seconds (ref 11.4–15.2)

## 2019-03-31 MED ORDER — CLEVIDIPINE BUTYRATE 0.5 MG/ML IV EMUL: 0.0000 mg/h | INTRAVENOUS | Status: DC

## 2019-03-31 MED ORDER — LABETALOL HCL 5 MG/ML IV SOLN
10.0000 mg | Freq: Once | INTRAVENOUS | Status: AC
  Administered 2019-03-31: 10 mg via INTRAVENOUS

## 2019-03-31 MED ORDER — LABETALOL HCL 5 MG/ML IV SOLN
20.0000 mg | Freq: Once | INTRAVENOUS | Status: DC
  Filled 2019-03-31: qty 4

## 2019-03-31 MED ORDER — IOHEXOL 350 MG/ML SOLN
50.0000 mL | Freq: Once | INTRAVENOUS | Status: AC | PRN
  Administered 2019-03-31: 50 mL via INTRAVENOUS

## 2019-03-31 MED ORDER — ACETAMINOPHEN 325 MG PO TABS: 650.0000 mg | ORAL_TABLET | ORAL | Status: DC | PRN

## 2019-03-31 MED ORDER — ALBUTEROL SULFATE (2.5 MG/3ML) 0.083% IN NEBU: 3.0000 mL | INHALATION_SOLUTION | Freq: Two times a day (BID) | RESPIRATORY_TRACT | Status: DC

## 2019-03-31 MED ORDER — ALTEPLASE (STROKE) FULL DOSE INFUSION
0.9000 mg/kg | Freq: Once | INTRAVENOUS | Status: AC
  Administered 2019-03-31: 17:00:00 49.5 mg via INTRAVENOUS
  Filled 2019-03-31: qty 100

## 2019-03-31 MED ORDER — PANTOPRAZOLE SODIUM 40 MG IV SOLR
40.0000 mg | Freq: Every day | INTRAVENOUS | Status: DC
  Administered 2019-03-31: 40 mg via INTRAVENOUS
  Filled 2019-03-31: qty 40

## 2019-03-31 MED ORDER — SENNOSIDES-DOCUSATE SODIUM 8.6-50 MG PO TABS: 1.0000 | ORAL_TABLET | Freq: Every evening | ORAL | Status: DC | PRN

## 2019-03-31 MED ORDER — SODIUM CHLORIDE 0.9 % IV SOLN: 50.0000 mL | Freq: Once | INTRAVENOUS | Status: AC

## 2019-03-31 MED ORDER — ACETAMINOPHEN 160 MG/5ML PO SOLN: 650.0000 mg | ORAL | Status: DC | PRN

## 2019-03-31 MED ORDER — STROKE: EARLY STAGES OF RECOVERY BOOK
Freq: Once | Status: AC
  Administered 2019-03-31: 19:00:00

## 2019-03-31 MED ORDER — ACETAMINOPHEN 650 MG RE SUPP: 650.0000 mg | RECTAL | Status: DC | PRN

## 2019-03-31 MED ORDER — SODIUM CHLORIDE 0.9% FLUSH
3.0000 mL | Freq: Once | INTRAVENOUS | Status: DC
Start: 2019-03-31 — End: 2019-04-01

## 2019-03-31 MED ORDER — ALBUTEROL SULFATE (2.5 MG/3ML) 0.083% IN NEBU: 3.0000 mL | INHALATION_SOLUTION | Freq: Two times a day (BID) | RESPIRATORY_TRACT | Status: DC | PRN

## 2019-03-31 MED ORDER — SODIUM CHLORIDE 0.9 % IV SOLN
INTRAVENOUS | Status: DC
  Administered 2019-03-31 – 2019-04-01 (×2): via INTRAVENOUS

## 2019-03-31 NOTE — Progress Notes (Signed)
PHARMACIST CODE STROKE RESPONSE  Notified to mix tPA at 1649 by Dr. Cheral Marker Delivered tPA to RN at 1653  tPA dose = 5mg  bolus over 1 minute followed by 44.5mg  for a total dose of 49.5mg  over 1 hour  Issues/delays encountered (if applicable): This code stroke was not paged out, pharmacy alerted by phone call from nursing staff in [place].  Initial SBP 185, decreased to goal s/p labetalol 10mg  IV x 1.    [name] Barker, PharmD Clinical Pharmacist Please check AMION for all [place] numbers 03/31/2019 5:04 PM

## 2019-03-31 NOTE — ED Provider Notes (Signed)
[facility] EMERGENCY DEPARTMENT Provider Note   CSN: [id number] Arrival date & time: 03/31/19  1600    History   Chief Complaint No chief complaint on file.   HPI [name] Barker is a 85 y.o. female with past medical history as stated below who presents to the ED as code stroke. Patients family noticed some expressive aphasia 1.5 hours prior to arrival in the ED. She had no other deficits reported by family or EMS. While in route to the ED, EMS noted left sided facial droop which prompted activation of tier 1 code stroke. Vitals as well as POC glucose within normal limits upon EMS arrival to the scene and in transit.       Illness  Severity:  Severe Onset quality:  Sudden Duration:  2 hours Timing:  Constant Progression:  Worsening Chronicity:  New Associated symptoms: no abdominal pain, no chest pain, no cough, no ear pain, no fever, no rash, no shortness of breath, no sore throat and no vomiting     Past Medical History:  Diagnosis Date   Allergic rhinitis    ANEMIA-NOS 11/17/2006   Colon cancer ([place]) 08/19/2007   1994 T2, 2008 T3, N1   DIVERTICULOSIS-COLON 05/30/2010   Femur fracture, left ([place]) 09/13/2018   CLOSED   GERD 11/17/2006   Hemorrhoids    HYPERLIPIDEMIA 11/17/2006   LOW BACK PAIN, CHRONIC 02/04/2010   Osteopenia 11/17/2006   Small bowel obstruction (HCC)    TIA (transient ischemic attack)     Patient Active Problem List   Diagnosis Date Noted   Stroke ([place]) 03/31/2019   Vasomotor rhinitis 11/17/2018   Closed fracture of left femur, initial encounter ([place]) 09/13/2018   Hypokalemia    Bilateral impacted cerumen 10/30/2017   Bronchitis 09/21/2017   Irregular heartbeat 09/21/2017   Mild dementia ([place]) 08/10/2017   Sensorineural hearing loss (SNHL), bilateral 08/08/2016   Bilateral hearing loss 08/07/2016   Depression 08/07/2016   Pre-syncope 02/01/2016   Vitamin D deficiency 11/04/2015   Fatigue  11/04/2015   Routine general medical examination at a health care facility 06/08/2015   TIA (transient ischemic attack) 04/04/2012   CAD 06/21/2010   LOW BACK PAIN, CHRONIC 02/04/2010   ADENOCARCINOMA, COLON 08/19/2007   HYPERLIPIDEMIA 11/17/2006   ANEMIA-NOS 11/17/2006   GERD 11/17/2006   OSTEOPENIA 11/17/2006    Past Surgical History:  Procedure Laterality Date   ABDOMINAL HYSTERECTOMY     APPENDECTOMY     BACK SURGERY  2007   BELPHAROPTOSIS REPAIR     CATARACT EXTRACTION     HEMICOLECTOMY  1994   Right   JOINT REPLACEMENT     KNEE ARTHROSCOPY     ORIF FEMUR FRACTURE Left 09/13/2018   Procedure: OPEN REDUCTION INTERNAL FIXATION (ORIF) DISTAL FEMUR FRACTURE;  Surgeon: Shona Needles, MD;  Location: [facility];  Service: Orthopedics;  Laterality: Left;   PARTIAL COLECTOMY  2008   ROTATOR CUFF REPAIR     SHOULDER SURGERY     VAGINAL PROLAPSE REPAIR       OB History   No obstetric history on file.      Home Medications    Prior to Admission medications   Medication Sig Start Date End Date Taking? Authorizing Provider  acetaminophen (TYLENOL) 500 MG tablet Take 500 mg by mouth every 6 (six) hours as needed for mild pain.    [provider]  albuterol (PROAIR HFA) 108 (90 Base) MCG/ACT inhaler INHALE 2 PUFFS INTO THE LUNGS 2 (TWO)  TIMES DAILY AS NEEDED. FOR WHEEZING 11/17/18   Hoyt Koch, MD  aspirin 81 MG EC tablet TAKE 1 TABLET (81 MG TOTAL) BY MOUTH DAILY. [place] INJECTION 09/15/18   [provider]  aspirin 81 MG tablet Take 1 tablet (81 mg total) by mouth daily. Resume after finishing lovenox injection 10/13/18   Florencia Reasons, MD  Cholecalciferol (VITAMIN D) 2000 UNITS tablet Take by mouth daily. 5,000 IU daily    [provider]  citalopram (CELEXA) 10 MG tablet Take 1 tablet (10 mg total) by mouth daily. Need annual visit for further refills 03/18/19   Hoyt Koch, MD  glycerin adult  (GLYCERIN ADULT) 2 G SUPP Place 1 suppository rectally daily.     [provider]  ibuprofen (ADVIL,MOTRIN) 200 MG tablet Take 200 mg by mouth every 6 (six) hours as needed.    [provider]  Multiple Vitamin (MULTIVITAMIN WITH MINERALS) TABS tablet Take 1 tablet by mouth daily. 09/16/18   Florencia Reasons, MD  senna-docusate (SENOKOT-S) 8.6-50 MG per tablet Take 1 tablet by mouth at bedtime as needed for mild constipation.     [provider]  VOLTAREN 1 % GEL APPLY TO AFFECTED AREA EVERY DAY AS NEEDED BACK PAIN Patient taking differently: Apply 2 g topically daily as needed (back pain).     Swords, Darrick Penna, MD    Family History Family History  Problem Relation Age of Onset   Lung cancer Sister    Colon cancer Sister 69   Kidney cancer Sister     Social History Social History   Tobacco Use   Smoking status: Never Smoker   Smokeless tobacco: Never Used  Substance Use Topics   Alcohol use: No   Drug use: No     Allergies   Fish-derived products; Flexeril [cyclobenzaprine]; Methenamine mandelate; Methenamine mandelate; Morphine sulfate; Penicillins; Promethazine hcl; and Statins   Review of Systems Review of Systems  Constitutional: Negative for chills and fever.  HENT: Negative for ear pain and sore throat.   Eyes: Negative for visual disturbance.  Respiratory: Negative for cough and shortness of breath.   Cardiovascular: Negative for chest pain and palpitations.  Gastrointestinal: Negative for abdominal pain and vomiting.  Genitourinary: Negative for dysuria and hematuria.  Musculoskeletal: Negative for arthralgias and back pain.  Skin: Negative for color change and rash.  Neurological: Positive for speech difficulty. Negative for seizures and syncope.  All other systems reviewed and are negative.    Physical Exam Updated Vital Signs BP (!) 162/61    Pulse (!) 57    Temp 98.4 F (36.9 C) (Oral)    Resp 20    Wt 55 kg    SpO2 94%    BMI  24.49 kg/m   Physical Exam Vitals signs and nursing note reviewed.  Constitutional:      General: She is not in acute distress.    Appearance: She is well-developed.  HENT:     Head: Normocephalic and atraumatic.  Eyes:     Conjunctiva/sclera: Conjunctivae normal.  Neck:     Musculoskeletal: Neck supple.  Cardiovascular:     Rate and Rhythm: Normal rate and regular rhythm.     Heart sounds: No murmur.  Pulmonary:     Effort: Pulmonary effort is normal. No respiratory distress.     Breath sounds: Normal breath sounds.  Abdominal:     Palpations: Abdomen is soft.     Tenderness: There is no abdominal tenderness.  Musculoskeletal: Normal range of motion.     Right lower leg: No edema.     Left lower leg: No edema.  Skin:    General: Skin is warm and dry.  Neurological:     Mental Status: She is alert.     Comments: CN II-XII intact. Alert and oriented to person. 5/5 strength in bilateral upper and lower extremities. Negative pronator drift. Some mild expressive aphasia noted with some delayed speech.       ED Treatments / Results  Labs (all labs ordered are listed, but only abnormal results are displayed) Labs Reviewed  CBC - Abnormal; Notable for the following components:      Result Value   Hemoglobin 11.5 (*)    All other components within normal limits  DIFFERENTIAL - Abnormal; Notable for the following components:   Monocytes Absolute 1.1 (*)    All other components within normal limits  COMPREHENSIVE METABOLIC PANEL - Abnormal; Notable for the following components:   Calcium 8.8 (*)    All other components within normal limits  POCT I-STAT EG7 - Abnormal; Notable for the following components:   pH, Ven 7.492 (*)    pCO2, Ven 38.3 (*)    Bicarbonate 29.4 (*)    Acid-Base Excess 6.0 (*)    Sodium 134 (*)    Calcium, Ion 0.93 (*)    HCT 35.0 (*)    Hemoglobin 11.9 (*)    All other components within normal limits  MRSA PCR SCREENING  PROTIME-INR  APTT    HEMOGLOBIN A1C  LIPID PANEL  I-STAT CREATININE, ED  CBG MONITORING, ED    EKG None  Radiology Ct Angio Head W Or Wo Contrast  Result Date: 03/31/2019 CLINICAL DATA:  Code stroke. Aphasia and left-sided facial droop. Last seen normal 1430 hours. EXAM: CT ANGIOGRAPHY HEAD AND NECK TECHNIQUE: Multidetector CT imaging of the head and neck was performed using the standard protocol during bolus administration of intravenous contrast. Multiplanar CT image reconstructions and MIPs were obtained to evaluate the vascular anatomy. Carotid stenosis measurements (when applicable) are obtained utilizing NASCET criteria, using the distal internal carotid diameter as the denominator. CONTRAST:  69mL OMNIPAQUE IOHEXOL 350 MG/ML SOLN COMPARISON:  Head CT earlier today. FINDINGS: CTA NECK FINDINGS Aortic arch: Aortic atherosclerosis and ectasia. Ascending aorta diameter measures up to 3.7 cm. No dissection. Branching pattern is normal. Right carotid system: Common carotid artery shows atherosclerotic plaque but is widely patent to the bifurcation. There is calcified plaque at the carotid bifurcation. Minimal diameter of the proximal ICA measures 3.5 mm. Compared to a more distal cervical ICA diameter of 5 mm, this indicates a 30% stenosis. Left carotid system: Common carotid artery widely patent to the bifurcation. Calcified plaque at the carotid bifurcation. Minimal diameter of the proximal ICA is 6 mm. There is no stenosis. Upper cervical internal carotid arteries are tortuous and ectatic. Vertebral arteries: Proximal anatomy is poorly seen because of severe venous reflux. Both vertebral artery origins are patent. No stenosis of the dominant left vertebral artery origin. Non dominant right vertebral artery shows stenosis estimated at 50%. Beyond that, the vessels are patent through the cervical region to the foramen magnum. Skeleton: Ordinary cervical spondylosis. Other neck: No lymphadenopathy. Dominant thyroid nodule  on the right measuring up to 2.7 cm in diameter. Thyroid ultrasound recommended at some point. Upper chest: Negative Review of the MIP images confirms the above findings CTA HEAD FINDINGS Anterior circulation: Both internal carotid arteries are patent through the skull  base and siphon regions. No siphon stenosis. The anterior and middle cerebral vessels are patent without proximal stenosis, aneurysm or vascular malformation. No missing large or medium vessels are identified. Fetal origin of the left PCA. Posterior circulation: Both vertebral arteries are patent through the foramen magnum to the basilar. No basilar stenosis. Posterior circulation branch vessels are patent without severe stenosis. Venous sinuses: Patent and normal. Anatomic variants: None significant. Delayed phase: No abnormal enhancement. Review of the MIP images confirms the above findings IMPRESSION: No large or medium vessel occlusion. Nonstenotic atherosclerotic change at both carotid bifurcations. 50% stenosis of the right non dominant vertebral artery origin. These results were called by telephone at the time of interpretation on 03/31/2019 at 4:52 pm to Dr. Kerney Elbe , who verbally acknowledged these results. Electronically Signed   By: Nelson Chimes M.D.   On: 03/31/2019 16:58   Ct Angio Neck W Or Wo Contrast  Result Date: 03/31/2019 CLINICAL DATA:  Code stroke. Aphasia and left-sided facial droop. Last seen normal 1430 hours. EXAM: CT ANGIOGRAPHY HEAD AND NECK TECHNIQUE: Multidetector CT imaging of the head and neck was performed using the standard protocol during bolus administration of intravenous contrast. Multiplanar CT image reconstructions and MIPs were obtained to evaluate the vascular anatomy. Carotid stenosis measurements (when applicable) are obtained utilizing NASCET criteria, using the distal internal carotid diameter as the denominator. CONTRAST:  62mL OMNIPAQUE IOHEXOL 350 MG/ML SOLN COMPARISON:  Head CT earlier today.  FINDINGS: CTA NECK FINDINGS Aortic arch: Aortic atherosclerosis and ectasia. Ascending aorta diameter measures up to 3.7 cm. No dissection. Branching pattern is normal. Right carotid system: Common carotid artery shows atherosclerotic plaque but is widely patent to the bifurcation. There is calcified plaque at the carotid bifurcation. Minimal diameter of the proximal ICA measures 3.5 mm. Compared to a more distal cervical ICA diameter of 5 mm, this indicates a 30% stenosis. Left carotid system: Common carotid artery widely patent to the bifurcation. Calcified plaque at the carotid bifurcation. Minimal diameter of the proximal ICA is 6 mm. There is no stenosis. Upper cervical internal carotid arteries are tortuous and ectatic. Vertebral arteries: Proximal anatomy is poorly seen because of severe venous reflux. Both vertebral artery origins are patent. No stenosis of the dominant left vertebral artery origin. Non dominant right vertebral artery shows stenosis estimated at 50%. Beyond that, the vessels are patent through the cervical region to the foramen magnum. Skeleton: Ordinary cervical spondylosis. Other neck: No lymphadenopathy. Dominant thyroid nodule on the right measuring up to 2.7 cm in diameter. Thyroid ultrasound recommended at some point. Upper chest: Negative Review of the MIP images confirms the above findings CTA HEAD FINDINGS Anterior circulation: Both internal carotid arteries are patent through the skull base and siphon regions. No siphon stenosis. The anterior and middle cerebral vessels are patent without proximal stenosis, aneurysm or vascular malformation. No missing large or medium vessels are identified. Fetal origin of the left PCA. Posterior circulation: Both vertebral arteries are patent through the foramen magnum to the basilar. No basilar stenosis. Posterior circulation branch vessels are patent without severe stenosis. Venous sinuses: Patent and normal. Anatomic variants: None  significant. Delayed phase: No abnormal enhancement. Review of the MIP images confirms the above findings IMPRESSION: No large or medium vessel occlusion. Nonstenotic atherosclerotic change at both carotid bifurcations. 50% stenosis of the right non dominant vertebral artery origin. These results were called by telephone at the time of interpretation on 03/31/2019 at 4:52 pm to Dr. Kerney Elbe , who verbally  acknowledged these results. Electronically Signed   By: Nelson Chimes M.D.   On: 03/31/2019 16:58   Ct Head Code Stroke Wo Contrast  Result Date: 03/31/2019 CLINICAL DATA:  Code stroke. Aphasia and left-sided facial droop. Last seen normal 1430 hours EXAM: CT HEAD WITHOUT CONTRAST TECHNIQUE: Contiguous axial images were obtained from the base of the skull through the vertex without intravenous contrast. COMPARISON:  09/13/2018 FINDINGS: Brain: Generalized atrophy with extensive chronic small-vessel ischemic changes throughout the hemispheric white matter and pons. No sign of acute infarction, mass lesion, hemorrhage, hydrocephalus or extra-axial collection. Vascular: There is atherosclerotic calcification of the major vessels at the base of the brain. Skull: Negative Sinuses/Orbits: Clear/normal Other: None ASPECTS ([place] Stroke Program Early CT Score) - Ganglionic level infarction (caudate, lentiform nuclei, internal capsule, insula, M1-M3 cortex): 7 - Supraganglionic infarction (M4-M6 cortex): 3 Total score (0-10 with 10 being normal): 10 IMPRESSION: 1. Chronic small-vessel ischemic changes and atrophy. No acute finding by CT. 2. ASPECTS is 10. 3. These results were communicated to Dr. Cheral Marker at [facility] 4/2/2020by text page via the [facility] messaging system. Electronically Signed   By: Nelson Chimes M.D.   On: 03/31/2019 16:15    Procedures Procedures (including critical care time)  Medications Ordered in ED Medications  sodium chloride flush (NS) 0.9 % injection 3 mL (has no administration in time  range)  0.9 %  sodium chloride infusion ( Intravenous Stopped 03/31/19 1814)  acetaminophen (TYLENOL) tablet 650 mg (has no administration in time range)    Or  acetaminophen (TYLENOL) solution 650 mg (has no administration in time range)    Or  acetaminophen (TYLENOL) suppository 650 mg (has no administration in time range)  senna-docusate (Senokot-S) tablet 1 tablet (has no administration in time range)  pantoprazole (PROTONIX) injection 40 mg (has no administration in time range)  labetalol (NORMODYNE,TRANDATE) injection 20 mg (20 mg Intravenous Not Given 03/31/19 1715)    And  clevidipine (CLEVIPREX) infusion 0.5 mg/mL (has no administration in time range)  albuterol (PROVENTIL) (2.5 MG/3ML) 0.083% nebulizer solution 3 mL (has no administration in time range)  iohexol (OMNIPAQUE) 350 MG/ML injection 50 mL (50 mLs Intravenous Contrast Given 03/31/19 1639)   stroke: mapping our early stages of recovery book ( Does not apply Given 03/31/19 1833)  alteplase (ACTIVASE) 1 mg/mL infusion 49.5 mg (0 mg Intravenous Stopped 03/31/19 1754)    Followed by  0.9 %  sodium chloride infusion (0 mLs Intravenous Duplicate 02/01/22 5361)  labetalol (NORMODYNE,TRANDATE) injection 10 mg (10 mg Intravenous Given 03/31/19 1649)     Initial Impression / Assessment and Plan / ED Course  I have reviewed the triage vital signs and the nursing notes.  Pertinent labs & imaging results that were available during my care of the patient were reviewed by me and considered in my medical decision making (see chart for details).        Patient is a 85 year old female who presented to the emergency department via EMS as tier 1 code stroke for expressive aphasia and left-sided facial droop.  Patient's last known normal was 1.5 hours prior to arrival in emergency department.  On initial evaluation of the patient she was hemodynamically stable and nontoxic-appearing.  Rapid neurologic examination was performed myself which showed  mild expressive aphasia however no motor deficits or facial droop was appreciated.  Patient was also evaluated by neurology upon arrival.   She was then taken to CT scanner where CT head, CT Angio of the head and  neck were performed.  No large or medium vessel occlusion.  Patient has 50% stenosis of the right nondominant vertebral artery origin.  No other acute findings were noted.  CBC with no leukocytosis and hemoglobin of 11.5 which largely appears to be the patient's baseline.  CMP with no significant metabolic or electrolyte derangements.  Coagulation profile within normal limits.  Patient's imaging was reviewed by on neurologist Dr. Cheral Marker. She was given TPA in ED per neurology recommendations. Patient will be admitted to neurology service for ongoing evaluation of strokelike symptoms.  She was in stable condition while in the ED and at time of admission.   Final Clinical Impressions(s) / ED Diagnoses   Final diagnoses:  Stroke-like symptoms  Expressive aphasia    ED Discharge Orders    None       Tommie Raymond, MD 03/31/19 2225    Isla Pence, MD 03/31/19 2229

## 2019-03-31 NOTE — H&P (Signed)
Admission H&P    Chief Complaint: Sudden onset of aphasia  HPI: [name] Barker is an 85 y.o. female who is fully functioning and conversant at baseline, presenting for acute onset of garbled speech. LKN 2:30 PM at home when she acutely had word-finding difficulty and garbled speech. Some gradual improvement occurred at home subsequently over a period of 45 minutes but she did not improve back to baseline. EMS was called and in transit her speech abruptly worsened. She remained aphasic on arrival to the ED bridge.   LSN: 2:30 PM tPA Given: Yes   Past Medical History:  Diagnosis Date  . Allergic rhinitis   . ANEMIA-NOS 11/17/2006  . Colon cancer ([facility]) 08/19/2007   1994 T2, 2008 T3, N1  . DIVERTICULOSIS-COLON 05/30/2010  . Femur fracture, left ([facility]) 09/13/2018   CLOSED  . GERD 11/17/2006  . Hemorrhoids   . HYPERLIPIDEMIA 11/17/2006  . LOW BACK PAIN, CHRONIC 02/04/2010  . Osteopenia 11/17/2006  . Small bowel obstruction ([place])   . TIA (transient ischemic attack)     Past Surgical History:  Procedure Laterality Date  . ABDOMINAL HYSTERECTOMY    . APPENDECTOMY    . BACK SURGERY  2007  . BELPHAROPTOSIS REPAIR    . CATARACT EXTRACTION    . HEMICOLECTOMY  1994   Right  . JOINT REPLACEMENT    . KNEE ARTHROSCOPY    . ORIF FEMUR FRACTURE Left 09/13/2018   Procedure: OPEN REDUCTION INTERNAL FIXATION (ORIF) DISTAL FEMUR FRACTURE;  Surgeon: Shona Needles, MD;  Location: [facility];  Service: Orthopedics;  Laterality: Left;  . PARTIAL COLECTOMY  2008  . ROTATOR CUFF REPAIR    . SHOULDER SURGERY    . VAGINAL PROLAPSE REPAIR      Family History  Problem Relation Age of Onset  . Lung cancer Sister   . Colon cancer Sister 65  . Kidney cancer Sister    Social History:  reports that she has never smoked. She has never used smokeless tobacco. She reports that she does not drink alcohol or use drugs.  Home Medications:    Allergies:  Allergies  Allergen Reactions  . Fish-Derived  Products Other (See Comments)    Muscle pain   . Flexeril [Cyclobenzaprine]     nightmares  . Methenamine Mandelate     REACTION: burning sensation  . Methenamine Mandelate Other (See Comments)    REACTION: burning sensation  . Morphine Sulfate     REACTION: blister  . Penicillins     REACTION: urticaria (hives)  . Promethazine Hcl     REACTION: hallucinations  . Statins Other (See Comments)    Muscle pain      ROS: Detailed ROS deferred in the context of acuity of presentation. Denies lateralized weakness. Positive for cough. No fever, no SOB, no known contacts with Covid positive individuals.  Physical Examination: Weight 55 kg.  HEENT-  [place]/AT  Heart: RRR Lungs - Respirations unlabored. Lungs clear Abdomen - NT/ND. BS normal Extremities - Warm and well perfused  Neurologic Examination: Mental Status:  Alert, oriented to hospital with otherwise garbled responses to orientation questions. Unable to repeat a phrase. Difficulty with naming. Speech dysfluent with word finding difficulty, occasionally with comprehensible phrases and at others garbled/incomprehensible. Phonemic paraphasias noted. Able to comprehend most motor commands. . Cranial Nerves: II:  Visual fields grossly intact on exam limited by communication deficit.   III,IV, VI: No ptosis. EOMI without nystagmus.  V: Has difficulty responding to questions regarding sensory perception  VII: Subtle lag on the right when smiling, but then symmetric.  VIII: HOH IX,X: No hypophonia or hoarseness XI: Head at midline.  XII: midline tongue extension  Motor: Right : Upper extremity   5/5    Left:     Upper extremity   5/5  Lower extremity   5/5     Lower extremity   5/5 Normal tone throughout; no atrophy noted Sensory: Temp and FT intact bilaterally. Positive for extinction on the right on two trials.  Deep Tendon Reflexes:  2+ bilateral brachioradialis, biceps and patellae Plantars: Right: downgoing   Left: downgoing  Cerebellar: No ataxia with FNF bilaterally Gait: Deferred  Results for orders placed or performed during the hospital encounter of 03/31/19 (from the past 48 hour(s))  CBC     Status: Abnormal   Collection Time: 03/31/19  4:01 PM  Result Value Ref Range   WBC 8.4 4.0 - 10.5 K/uL   RBC 4.02 3.87 - 5.11 MIL/uL   Hemoglobin 11.5 (L) 12.0 - 15.0 g/dL   HCT 38.3 36.0 - 46.0 %   MCV 95.3 80.0 - 100.0 fL   MCH 28.6 26.0 - 34.0 pg   MCHC 30.0 30.0 - 36.0 g/dL   RDW 13.2 11.5 - 15.5 %   Platelets 236 150 - 400 K/uL   nRBC 0.0 0.0 - 0.2 %    Comment: Performed at [facility], [place] [address] [place], [place] [address]  Differential     Status: Abnormal   Collection Time: 03/31/19  4:01 PM  Result Value Ref Range   Neutrophils Relative % 45 %   Neutro Abs 3.8 1.7 - 7.7 K/uL   Lymphocytes Relative 39 %   Lymphs Abs 3.3 0.7 - 4.0 K/uL   Monocytes Relative 13 %   Monocytes Absolute 1.1 (H) 0.1 - 1.0 K/uL   Eosinophils Relative 2 %   Eosinophils Absolute 0.2 0.0 - 0.5 K/uL   Basophils Relative 1 %   Basophils Absolute 0.0 0.0 - 0.1 K/uL   Immature Granulocytes 0 %   Abs Immature Granulocytes 0.02 0.00 - 0.07 K/uL    Comment: Performed at [facility] [address], [place], [place] [address]  Comprehensive metabolic panel     Status: Abnormal   Collection Time: 03/31/19  4:01 PM  Result Value Ref Range   Sodium 136 135 - 145 mmol/L   Potassium 4.1 3.5 - 5.1 mmol/L   Chloride 99 98 - 111 mmol/L   CO2 29 22 - 32 mmol/L   Glucose, Bld 94 70 - 99 mg/dL   BUN 15 8 - 23 mg/dL   Creatinine, Ser 0.59 0.44 - 1.00 mg/dL   Calcium 8.8 (L) 8.9 - 10.3 mg/dL   Total Protein 6.7 6.5 - 8.1 g/dL   Albumin 3.6 3.5 - 5.0 g/dL   AST 27 15 - 41 U/L   ALT 11 0 - 44 U/L   Alkaline Phosphatase 73 38 - 126 U/L   Total Bilirubin 0.5 0.3 - 1.2 mg/dL   GFR calc non Af Amer >60 >60 mL/min   GFR calc Af Amer >60 >60 mL/min   Anion gap 8 5 - 15    Comment: Performed at [facility] [address], [place], [place] [address]  POCT I-Stat EG7     Status: Abnormal   Collection Time: 03/31/19  4:09 PM  Result Value Ref Range   pH, Ven 7.492 (H) 7.250 - 7.430  pCO2, Ven 38.3 (L) 44.0 - 60.0 mmHg   pO2, Ven 35.0 32.0 - 45.0 mmHg   Bicarbonate 29.4 (H) 20.0 - 28.0 mmol/L   TCO2 31 22 - 32 mmol/L   O2 Saturation 73.0 %   Acid-Base Excess 6.0 (H) 0.0 - 2.0 mmol/L   Sodium 134 (L) 135 - 145 mmol/L   Potassium 4.2 3.5 - 5.1 mmol/L   Calcium, Ion 0.93 (L) 1.15 - 1.40 mmol/L   HCT 35.0 (L) 36.0 - 46.0 %   Hemoglobin 11.9 (L) 12.0 - 15.0 g/dL   Patient temperature HIDE    Sample type VENOUS    Comment NOTIFIED PHYSICIAN   I-stat Creatinine, ED     Status: None   Collection Time: 03/31/19  4:11 PM  Result Value Ref Range   Creatinine, Ser 0.50 0.44 - 1.00 mg/dL   Ct Head Code Stroke Wo Contrast  Result Date: 03/31/2019 CLINICAL DATA:  Code stroke. Aphasia and left-sided facial droop. Last seen normal 1430 hours EXAM: CT HEAD WITHOUT CONTRAST TECHNIQUE: Contiguous axial images were obtained from the base of the skull through the vertex without intravenous contrast. COMPARISON:  09/13/2018 FINDINGS: Brain: Generalized atrophy with extensive chronic small-vessel ischemic changes throughout the hemispheric white matter and pons. No sign of acute infarction, mass lesion, hemorrhage, hydrocephalus or extra-axial collection. Vascular: There is atherosclerotic calcification of the major vessels at the base of the brain. Skull: Negative Sinuses/Orbits: Clear/normal Other: None ASPECTS ([place] Stroke Program Early CT Score) - Ganglionic level infarction (caudate, lentiform nuclei, internal capsule, insula, M1-M3 cortex): 7 - Supraganglionic infarction (M4-M6 cortex): 3 Total score (0-10 with 10 being normal): 10 IMPRESSION: 1. Chronic small-vessel ischemic changes and atrophy. No acute finding by CT. 2. ASPECTS is 10. 3. These results were communicated to Dr. Cheral Marker at [facility] 4/2/2020by  text page via the [facility] messaging system. Electronically Signed   By: Nelson Chimes M.D.   On: 03/31/2019 16:15    Assessment: 85 y.o. female presenting with acute onset of aphasia.  1. Overall clinical picture most consistent with an acute ischemic stroke involving the left frontal operculum and possibly the left parietal lobe given the extinction on the right 2. Stroke Risk Factors - TIA 3. The patient is a candidate for tPA with no contraindications based on telephone conversation with her daughter [name], [phone]). Discussed extensively the risks/benefits of tPA treatment vs. no treatment with the patient's daughter, including risks of hemorrhage and death with tPA administration versus worse overall outcomes on average in patients within tPA time window who are not administered tPA. The patient's aphasia precludes meaningful medical decision making on her part at this time. Overall benefits of tPA regarding long-term prognosis are felt to outweigh risks. The patient's daughter expressed understanding and wish to proceed with tPA. Consent was witnessed by the CT technician performing the patient's CT. 4. Classifiable as having failed ASA monotherapy.  Plan: 1. Admitting to Neuro ICU.  2. Post-tPA order set to include frequent neuro checks and BP management.  3. No antiplatelet medications or anticoagulants for at least 24 hours following tPA.  4. DVT prophylaxis with SCDs.  5. Will need to be started on a statin.  6. Will need escalation of antiplatelet therapy if follow up CT at 24 hours is negative for hemorrhagic conversion. 7. Carotid ultrasound to further evaluate the ICA atherosclerotic narrowing seen on CTA.   8. TTE.  9. MRI brain  10. PT/OT/Speech.  11. NPO until passes swallow evaluation.  12.  Telemetry monitoring 13. Fasting lipid panel, HgbA1c  A total of 55 minutes was spent in the emergent neurological evaluation and management of this critically ill patient.    Electronically signed: Dr. Kerney Elbe 03/31/2019, 4:51 PM

## 2019-04-01 ENCOUNTER — Other Ambulatory Visit: Payer: Self-pay | Admitting: Physician Assistant

## 2019-04-01 ENCOUNTER — Inpatient Hospital Stay (HOSPITAL_COMMUNITY): Payer: Medicare Other

## 2019-04-01 DIAGNOSIS — I639 Cerebral infarction, unspecified: Secondary | ICD-10-CM

## 2019-04-01 DIAGNOSIS — G459 Transient cerebral ischemic attack, unspecified: Principal | ICD-10-CM

## 2019-04-01 LAB — LIPID PANEL
Cholesterol: 194 mg/dL (ref 0–200)
HDL: 42 mg/dL (ref >40)
LDL Cholesterol: 137 mg/dL — ABNORMAL HIGH (ref 0–99)
Total CHOL/HDL Ratio: 4.6 RATIO
Triglycerides: 75 mg/dL (ref <150)
VLDL: 15 mg/dL (ref 0–40)

## 2019-04-01 LAB — ECHOCARDIOGRAM COMPLETE: Weight: 1940.05 oz

## 2019-04-01 LAB — HEMOGLOBIN A1C
Hgb A1c MFr Bld: 5.3 % (ref 4.8–5.6)
Mean Plasma Glucose: 105.41 mg/dL

## 2019-04-01 MED ORDER — CLOPIDOGREL BISULFATE 75 MG PO TABS: 75.0000 mg | ORAL_TABLET | Freq: Every day | ORAL | 2 refills | Status: DC

## 2019-04-01 MED ORDER — CLOPIDOGREL BISULFATE 75 MG PO TABS
75.0000 mg | ORAL_TABLET | Freq: Every day | ORAL | Status: DC
  Administered 2019-04-01: 18:00:00 75 mg via ORAL
  Filled 2019-04-01: qty 1

## 2019-04-01 MED ORDER — ASPIRIN EC 81 MG PO TBEC
81.0000 mg | DELAYED_RELEASE_TABLET | Freq: Every day | ORAL | Status: DC
  Administered 2019-04-01: 81 mg via ORAL
  Filled 2019-04-01: qty 1

## 2019-04-01 MED ORDER — ASPIRIN 81 MG PO TBEC: 81.0000 mg | DELAYED_RELEASE_TABLET | Freq: Every day | ORAL | Status: AC

## 2019-04-01 NOTE — Evaluation (Signed)
Physical Therapy Evaluation Patient Details Name: [name] Barker MRN: [id number] DOB: 1921/06/15 Today's Date: 04/01/2019   History of Present Illness  84 yo admitted with aphasia with CT demonstrating chronic ischemic changes. PMHx: mild dementia,   Clinical Impression  Pt very pleasant reporting that her 102yo spouse is in CIR. Pt able to report sequence of events and aphasia prompting admission. Pt able to move with minguard assist and enjoys telling stories of her kids and 18 siblings. Pt with generalized weakness and decreased balance who will benefit from acute therapy to maximize mobility, function and safety.  152/91 prior to mobility 188/93 after gait     Follow Up Recommendations Home health PT;Supervision/Assistance - 24 hour    Equipment Recommendations  None recommended by PT    Recommendations for Other Services       Precautions / Restrictions Precautions Precautions: Fall Restrictions Weight Bearing Restrictions: No      Mobility  Bed Mobility Overal bed mobility: Needs Assistance Bed Mobility: Supine to Sit     Supine to sit: Supervision     General bed mobility comments: assist for lines and safety  Transfers Overall transfer level: Needs assistance   Transfers: Sit to/from Stand Sit to Stand: Min guard         General transfer comment: guarding to stand and for lines, RW present to hold in standing  Ambulation/Gait Ambulation/Gait assistance: Min guard Gait Distance (Feet): 100 Feet Assistive device: Rolling walker (2 wheeled) Gait Pattern/deviations: Step-through pattern;Decreased stride length   Gait velocity interpretation: 1.31 - 2.62 ft/sec, indicative of limited community ambulator General Gait Details: guarding for safety and lines  Stairs            Wheelchair Mobility    Modified Rankin (Stroke Patients Only) Modified Rankin (Stroke Patients Only) Pre-Morbid Rankin Score: Moderate disability Modified Rankin:  Moderately severe disability     Balance Overall balance assessment: Needs assistance   Sitting balance-Leahy Scale: Fair       Standing balance-Leahy Scale: Poor Standing balance comment: bil UE support on RW for standing and gait                             Pertinent Vitals/Pain Pain Assessment: No/denies pain    Home Living Family/patient expects to be discharged to:: Private residence Living Arrangements: Spouse/significant other;Children Available Help at Discharge: Family;Personal care attendant;Available 24 hours/day Type of Home: House Home Access: Stairs to enter Entrance Stairs-Rails: Right Entrance Stairs-Number of Steps: 2 Home Layout: One level Home Equipment: Bedside commode;Walker - 2 wheels;Shower seat      Prior Function Level of Independence: Needs assistance   Gait / Transfers Assistance Needed: RW for ambulation     Comments: Pt caregiver and spouse report pt requiring assistance for washing back and donning/doffing clothing. Pt does not cook or clean for family.     Hand Dominance        Extremity/Trunk Assessment   Upper Extremity Assessment Upper Extremity Assessment: Overall WFL for tasks assessed    Lower Extremity Assessment Lower Extremity Assessment: Generalized weakness    Cervical / Trunk Assessment Cervical / Trunk Assessment: Kyphotic  Communication   Communication: HOH  Cognition Arousal/Alertness: Awake/alert Behavior During Therapy: WFL for tasks assessed/performed Overall Cognitive Status: History of cognitive impairments - at baseline  General Comments      Exercises     Assessment/Plan    PT Assessment Patient needs continued PT services  PT Problem List Decreased activity tolerance;Decreased mobility;Decreased balance;Decreased cognition;Decreased safety awareness       PT Treatment Interventions Gait training;Therapeutic  activities;Neuromuscular re-education;Stair training;Therapeutic exercise;DME instruction;Functional mobility training;Balance training;Patient/family education    PT Goals (Current goals can be found in the Care Plan section)  Acute Rehab PT Goals Patient Stated Goal: return home PT Goal Formulation: With patient Time For Goal Achievement: 04/15/19 Potential to Achieve Goals: Good    Frequency Min 3X/week   Barriers to discharge Decreased caregiver support      Co-evaluation PT/OT/SLP Co-Evaluation/Treatment: Yes Reason for Co-Treatment: For patient/therapist safety;To address functional/ADL transfers PT goals addressed during session: Mobility/safety with mobility;Balance OT goals addressed during session: ADL's and self-care;Strengthening/ROM       AM-PAC PT "6 Clicks" Mobility  Outcome Measure Help needed turning from your back to your side while in a flat bed without using bedrails?: A Little Help needed moving from lying on your back to sitting on the side of a flat bed without using bedrails?: A Little Help needed moving to and from a bed to a chair (including a wheelchair)?: A Little Help needed standing up from a chair using your arms (e.g., wheelchair or bedside chair)?: A Little Help needed to walk in hospital room?: A Little Help needed climbing 3-5 steps with a railing? : A Little 6 Click Score: 18    End of Session Equipment Utilized During Treatment: Gait belt Activity Tolerance: Patient tolerated treatment well Patient left: in chair;with call bell/phone within reach;with chair alarm set Nurse Communication: Mobility status PT Visit Diagnosis: Other abnormalities of gait and mobility (R26.89);Muscle weakness (generalized) (M62.81)    Time: 2703-5009 PT Time Calculation (min) (ACUTE ONLY): 23 min   Charges:   PT Evaluation $PT Eval Moderate Complexity: 1 Mod          [place], PT Acute Rehabilitation Services Pager: [phone] Office:  [phone]   Bela Bonaparte B Dawnmarie Breon 04/01/2019, 9:54 AM

## 2019-04-01 NOTE — Progress Notes (Signed)
STROKE TEAM PROGRESS NOTE   INTERVAL HISTORY Patient says she is doing well and her neurological deficits have resolved. Blood pressure is adequate control. She has no complaints. She wants to go home.  Vitals:   04/01/19 0430 04/01/19 0500 04/01/19 0600 04/01/19 0700  BP: (!) 162/76 (!) 151/52 (!) 151/60 (!) 142/101  Pulse: (!) 59 (!) 56 (!) 54 (!) 58  Resp: (!) 21 14 13 17   Temp:      TempSrc:      SpO2: 95% 92% 97% (!) 89%  Weight:        CBC:  Recent Labs  Lab 03/31/19 1601 03/31/19 1609  WBC 8.4  --   NEUTROABS 3.8  --   HGB 11.5* 11.9*  HCT 38.3 35.0*  MCV 95.3  --   PLT 236  --     Basic Metabolic Panel:  Recent Labs  Lab 03/31/19 1601 03/31/19 1609 03/31/19 1611  NA 136 134*  --   K 4.1 4.2  --   CL 99  --   --   CO2 29  --   --   GLUCOSE 94  --   --   BUN 15  --   --   CREATININE 0.59  --  0.50  CALCIUM 8.8*  --   --    Lipid Panel:     Component Value Date/Time   CHOL 194 04/01/2019 0312   TRIG 75 04/01/2019 0312   HDL 42 04/01/2019 0312   CHOLHDL 4.6 04/01/2019 0312   VLDL 15 04/01/2019 0312   LDLCALC 137 (H) 04/01/2019 0312   HgbA1c:  Lab Results  Component Value Date   HGBA1C 5.3 04/01/2019   Urine Drug Screen: No results found for: LABOPIA, COCAINSCRNUR, LABBENZ, AMPHETMU, THCU, LABBARB  Alcohol Level No results found for: ETH  IMAGING Ct Angio Head W Or Wo Contrast  Result Date: 03/31/2019 CLINICAL DATA:  Code stroke. Aphasia and left-sided facial droop. Last seen normal 1430 hours. EXAM: CT ANGIOGRAPHY HEAD AND NECK TECHNIQUE: Multidetector CT imaging of the head and neck was performed using the standard protocol during bolus administration of intravenous contrast. Multiplanar CT image reconstructions and MIPs were obtained to evaluate the vascular anatomy. Carotid stenosis measurements (when applicable) are obtained utilizing NASCET criteria, using the distal internal carotid diameter as the denominator. CONTRAST:  11mL OMNIPAQUE  IOHEXOL 350 MG/ML SOLN COMPARISON:  Head CT earlier today. FINDINGS: CTA NECK FINDINGS Aortic arch: Aortic atherosclerosis and ectasia. Ascending aorta diameter measures up to 3.7 cm. No dissection. Branching pattern is normal. Right carotid system: Common carotid artery shows atherosclerotic plaque but is widely patent to the bifurcation. There is calcified plaque at the carotid bifurcation. Minimal diameter of the proximal ICA measures 3.5 mm. Compared to a more distal cervical ICA diameter of 5 mm, this indicates a 30% stenosis. Left carotid system: Common carotid artery widely patent to the bifurcation. Calcified plaque at the carotid bifurcation. Minimal diameter of the proximal ICA is 6 mm. There is no stenosis. Upper cervical internal carotid arteries are tortuous and ectatic. Vertebral arteries: Proximal anatomy is poorly seen because of severe venous reflux. Both vertebral artery origins are patent. No stenosis of the dominant left vertebral artery origin. Non dominant right vertebral artery shows stenosis estimated at 50%. Beyond that, the vessels are patent through the cervical region to the foramen magnum. Skeleton: Ordinary cervical spondylosis. Other neck: No lymphadenopathy. Dominant thyroid nodule on the right measuring up to 2.7 cm in diameter. Thyroid ultrasound  recommended at some point. Upper chest: Negative Review of the MIP images confirms the above findings CTA HEAD FINDINGS Anterior circulation: Both internal carotid arteries are patent through the skull base and siphon regions. No siphon stenosis. The anterior and middle cerebral vessels are patent without proximal stenosis, aneurysm or vascular malformation. No missing large or medium vessels are identified. Fetal origin of the left PCA. Posterior circulation: Both vertebral arteries are patent through the foramen magnum to the basilar. No basilar stenosis. Posterior circulation branch vessels are patent without severe stenosis. Venous  sinuses: Patent and normal. Anatomic variants: None significant. Delayed phase: No abnormal enhancement. Review of the MIP images confirms the above findings IMPRESSION: No large or medium vessel occlusion. Nonstenotic atherosclerotic change at both carotid bifurcations. 50% stenosis of the right non dominant vertebral artery origin. These results were called by telephone at the time of interpretation on 03/31/2019 at 4:52 pm to Dr. Kerney Elbe , who verbally acknowledged these results. Electronically Signed   By: Nelson Chimes M.D.   On: 03/31/2019 16:58   Ct Angio Neck W Or Wo Contrast  Result Date: 03/31/2019 CLINICAL DATA:  Code stroke. Aphasia and left-sided facial droop. Last seen normal 1430 hours. EXAM: CT ANGIOGRAPHY HEAD AND NECK TECHNIQUE: Multidetector CT imaging of the head and neck was performed using the standard protocol during bolus administration of intravenous contrast. Multiplanar CT image reconstructions and MIPs were obtained to evaluate the vascular anatomy. Carotid stenosis measurements (when applicable) are obtained utilizing NASCET criteria, using the distal internal carotid diameter as the denominator. CONTRAST:  51mL OMNIPAQUE IOHEXOL 350 MG/ML SOLN COMPARISON:  Head CT earlier today. FINDINGS: CTA NECK FINDINGS Aortic arch: Aortic atherosclerosis and ectasia. Ascending aorta diameter measures up to 3.7 cm. No dissection. Branching pattern is normal. Right carotid system: Common carotid artery shows atherosclerotic plaque but is widely patent to the bifurcation. There is calcified plaque at the carotid bifurcation. Minimal diameter of the proximal ICA measures 3.5 mm. Compared to a more distal cervical ICA diameter of 5 mm, this indicates a 30% stenosis. Left carotid system: Common carotid artery widely patent to the bifurcation. Calcified plaque at the carotid bifurcation. Minimal diameter of the proximal ICA is 6 mm. There is no stenosis. Upper cervical internal carotid arteries are  tortuous and ectatic. Vertebral arteries: Proximal anatomy is poorly seen because of severe venous reflux. Both vertebral artery origins are patent. No stenosis of the dominant left vertebral artery origin. Non dominant right vertebral artery shows stenosis estimated at 50%. Beyond that, the vessels are patent through the cervical region to the foramen magnum. Skeleton: Ordinary cervical spondylosis. Other neck: No lymphadenopathy. Dominant thyroid nodule on the right measuring up to 2.7 cm in diameter. Thyroid ultrasound recommended at some point. Upper chest: Negative Review of the MIP images confirms the above findings CTA HEAD FINDINGS Anterior circulation: Both internal carotid arteries are patent through the skull base and siphon regions. No siphon stenosis. The anterior and middle cerebral vessels are patent without proximal stenosis, aneurysm or vascular malformation. No missing large or medium vessels are identified. Fetal origin of the left PCA. Posterior circulation: Both vertebral arteries are patent through the foramen magnum to the basilar. No basilar stenosis. Posterior circulation branch vessels are patent without severe stenosis. Venous sinuses: Patent and normal. Anatomic variants: None significant. Delayed phase: No abnormal enhancement. Review of the MIP images confirms the above findings IMPRESSION: No large or medium vessel occlusion. Nonstenotic atherosclerotic change at both carotid bifurcations. 50% stenosis of  the right non dominant vertebral artery origin. These results were called by telephone at the time of interpretation on 03/31/2019 at 4:52 pm to Dr. Kerney Elbe , who verbally acknowledged these results. Electronically Signed   By: Nelson Chimes M.D.   On: 03/31/2019 16:58   Ct Head Code Stroke Wo Contrast  Result Date: 03/31/2019 CLINICAL DATA:  Code stroke. Aphasia and left-sided facial droop. Last seen normal 1430 hours EXAM: CT HEAD WITHOUT CONTRAST TECHNIQUE: Contiguous axial  images were obtained from the base of the skull through the vertex without intravenous contrast. COMPARISON:  09/13/2018 FINDINGS: Brain: Generalized atrophy with extensive chronic small-vessel ischemic changes throughout the hemispheric white matter and pons. No sign of acute infarction, mass lesion, hemorrhage, hydrocephalus or extra-axial collection. Vascular: There is atherosclerotic calcification of the major vessels at the base of the brain. Skull: Negative Sinuses/Orbits: Clear/normal Other: None ASPECTS ([place] Stroke Program Early CT Score) - Ganglionic level infarction (caudate, lentiform nuclei, internal capsule, insula, M1-M3 cortex): 7 - Supraganglionic infarction (M4-M6 cortex): 3 Total score (0-10 with 10 being normal): 10 IMPRESSION: 1. Chronic small-vessel ischemic changes and atrophy. No acute finding by CT. 2. ASPECTS is 10. 3. These results were communicated to Dr. Cheral Marker at [place] 4/2/2020by text page via the [facility] messaging system. Electronically Signed   By: Nelson Chimes M.D.   On: 03/31/2019 16:15    PHYSICAL EXAM Pleasant elderly Caucasian lady not in distress. . Afebrile. Head is nontraumatic. Neck is supple without bruit.    Cardiac exam no murmur or gallop. Lungs are clear to auscultation. Distal pulses are well felt. Neurological Exam ;  Awake  Alert oriented x 2.diminished attention, registration and recall. Follows one and two-step commands. Normal speech and language.eye movements full without nystagmus.fundi were not visualized. Vision acuity and fields appear normal. Hearing is significantly diminished bilaterally Palatal movements are normal. Face symmetric. Tongue midline. Normal strength, tone, reflexes and coordination. Normal sensation. Gait deferred.  ASSESSMENT/PLAN Ms. TEAUNA DUBACH is a 85 y.o. female with history of HLD and prior TIA presenting with word finding difficulties and garbled speech. TPA administered 03/31/2019 at 1654.  Possible TIA,  strokelike symptoms status post IV TPA -likely baseline dementia, similar episode in 2018  Code Stroke CT head No acute stroke. Small vessel disease. Atrophy. ASPECTS 10.     CTA head & neck no LVO. B ICA atherosclerosis. R VA 50% stenosis origin.   MRI  No acute stroke. Atrophy. SVD.  2D Echo Pending  LDL 137  HgbA1c 5.3  SCDs for VTE prophylaxis  aspirin 81 mg daily prior to admission, now on No antithrombotic as within 24h of tPA administration. Given TIA, recommend aspirin 81 mg and plavix 75 mg daily x 3 weeks, then PLAVIX alone. Orders adjusted.   Therapy recommendations:  HH PT and OT (ordered)  Disposition:  pending   Requested outpatient 30-day monitor to look for A. fib as possible source  Hypertension  No hx HTN . Home meds:  None  Stable . BP goal per post tPA guidelines . D/C BP goal normotensive  Hyperlipidemia  Home meds:  No statin  LDL 137, goal < 70  Intolerant to statins, muscle pain. Will not add  Continue statin at discharge  Other Stroke Risk Factors  Advanced age  Hx stroke/TIA  03/2017 - recurrent transient language difficulties, not felt to be a TIA. Differential included mild cognitive impairment, migraine variant or stress  Other Active Problems  Hx colon cancer  GERD  LBP  Hospital day # 1  I have personally obtained history,examined this patient, reviewed notes, independently viewed imaging studies, participated in medical decision making and plan of care.ROS completed by me personally and pertinent positives fully documented  I have made any additions or clarifications directly to the above note. She presented with transient speech difficulties possible left hemispheric TIA versus strokelike episode and resolved completely after IV tPA. She interestingly had somewhat similar episode of 2 years ago. Continue strict blood pressure control and close neurological monitoring as per post TPA protocol. Continue ongoing stroke workup.  Mobilize out of bed. Therapy consults. Possible discharge after workup is completed later today or tomorrow. She will likely need outpatient cardiac monitor for paroxysmal A. Fib given prior history of cardiac arrhythmias. This patient is critically ill and at significant risk of neurological worsening, death and care requires constant monitoring of vital signs, hemodynamics,respiratory and cardiac monitoring, extensive review of multiple databases, frequent neurological assessment, discussion with family, other specialists and medical decision making of high complexity.I have made any additions or clarifications directly to the above note.This critical care time does not reflect procedure time, or teaching time or supervisory time of PA/NP/Med Resident etc but could involve care discussion time.  I spent 30 minutes of neurocritical care time  in the care of  this patient.      Antony Contras, MD Medical Director [place] Pager: [phone] 04/01/2019 3:46 PM   To contact Stroke Continuity provider, please refer to [email or web address] After hours, contact General Neurology

## 2019-04-01 NOTE — Progress Notes (Signed)
2D Echocardiogram has been performed.  [name] Barker 04/01/2019, 11:12 AM

## 2019-04-01 NOTE — Discharge Summary (Addendum)
Stroke Discharge Summary  Patient ID: [name] Barker   MRN: [id number]      DOB: 09-30-1921  Date of Admission: 03/31/2019 Date of Discharge: 04/01/2019  Attending Physician:  Garvin Fila, MD, Stroke MD Consultant(s):    None  Patient's PCP:  Hoyt Koch, MD  DISCHARGE DIAGNOSIS:  Principal Problem:   Stroke-like episode [facility]) s/p tPA Active Problems:   Hyperlipidemia   GERD   TIA (transient ischemic attack)   Mild dementia [facility])   Past Medical History:  Diagnosis Date  . Allergic rhinitis   . ANEMIA-NOS 11/17/2006  . Colon cancer ([place]) 08/19/2007   1994 T2, 2008 T3, N1  . DIVERTICULOSIS-COLON 05/30/2010  . Femur fracture, left ([place]) 09/13/2018   CLOSED  . GERD 11/17/2006  . Hemorrhoids   . HYPERLIPIDEMIA 11/17/2006  . LOW BACK PAIN, CHRONIC 02/04/2010  . Osteopenia 11/17/2006  . Small bowel obstruction ([place])   . TIA (transient ischemic attack)    Past Surgical History:  Procedure Laterality Date  . ABDOMINAL HYSTERECTOMY    . APPENDECTOMY    . BACK SURGERY  2007  . BELPHAROPTOSIS REPAIR    . CATARACT EXTRACTION    . HEMICOLECTOMY  1994   Right  . JOINT REPLACEMENT    . KNEE ARTHROSCOPY    . ORIF FEMUR FRACTURE Left 09/13/2018   Procedure: OPEN REDUCTION INTERNAL FIXATION (ORIF) DISTAL FEMUR FRACTURE;  Surgeon: Shona Needles, MD;  Location: [place];  Service: Orthopedics;  Laterality: Left;  . PARTIAL COLECTOMY  2008  . ROTATOR CUFF REPAIR    . SHOULDER SURGERY    . VAGINAL PROLAPSE REPAIR      Allergies as of 04/01/2019      Reactions   Fish-derived Products Other (See Comments)   Muscle pain    Flexeril [cyclobenzaprine]    nightmares   Methenamine Mandelate    REACTION: burning sensation   Methenamine Mandelate Other (See Comments)   REACTION: burning sensation   Morphine Sulfate    REACTION: blister   Penicillins    REACTION: urticaria (hives)   Promethazine Hcl    REACTION: hallucinations   Statins Other (See Comments)   Muscle  pain       Medication List    TAKE these medications   acetaminophen 500 MG tablet Commonly known as:  TYLENOL Take 500 mg by mouth every 6 (six) hours as needed for mild pain.   albuterol 108 (90 Base) MCG/ACT inhaler Commonly known as:  ProAir HFA INHALE 2 PUFFS INTO THE LUNGS 2 (TWO) TIMES DAILY AS NEEDED. FOR WHEEZING   aspirin 81 MG EC tablet Take 1 tablet (81 mg total) by mouth daily for 21 days. Take with plavix for 21 days then stop and take plavix alone. What changed:    See the new instructions.  Another medication with the same name was removed. Continue taking this medication, and follow the directions you see here.   citalopram 10 MG tablet Commonly known as:  CELEXA Take 1 tablet (10 mg total) by mouth daily. Need annual visit for further refills   clopidogrel 75 MG tablet Commonly known as:  PLAVIX Take 1 tablet (75 mg total) by mouth daily.   glycerin adult 2 g Supp Place 1 suppository rectally daily.   ibuprofen 200 MG tablet Commonly known as:  ADVIL,MOTRIN Take 200 mg by mouth every 6 (six) hours as needed.   multivitamin with minerals Tabs tablet Take 1 tablet by mouth daily.  senna-docusate 8.6-50 MG tablet Commonly known as:  Senokot-S Take 1 tablet by mouth at bedtime as needed for mild constipation.   Vitamin D 50 MCG (2000 UT) tablet Take by mouth daily. 5,000 IU daily   Voltaren 1 % Gel Generic drug:  diclofenac sodium APPLY TO AFFECTED AREA EVERY DAY AS NEEDED BACK PAIN What changed:  See the new instructions.       LABORATORY STUDIES CBC    Component Value Date/Time   WBC 8.4 03/31/2019 1601   RBC 4.02 03/31/2019 1601   HGB 11.9 (L) 03/31/2019 1609   HGB 11.6 02/10/2012 1145   HCT 35.0 (L) 03/31/2019 1609   HCT 34.8 02/10/2012 1145   PLT 236 03/31/2019 1601   PLT 228 02/10/2012 1145   MCV 95.3 03/31/2019 1601   MCV 91.9 02/10/2012 1145   MCH 28.6 03/31/2019 1601   MCHC 30.0 03/31/2019 1601   RDW 13.2 03/31/2019 1601    RDW 12.9 02/10/2012 1145   LYMPHSABS 3.3 03/31/2019 1601   LYMPHSABS 2.0 02/10/2012 1145   MONOABS 1.1 (H) 03/31/2019 1601   MONOABS 0.6 02/10/2012 1145   EOSABS 0.2 03/31/2019 1601   EOSABS 0.1 02/10/2012 1145   BASOSABS 0.0 03/31/2019 1601   BASOSABS 0.0 02/10/2012 1145   CMP    Component Value Date/Time   NA 134 (L) 03/31/2019 1609   NA 134 (A) 09/23/2018   K 4.2 03/31/2019 1609   CL 99 03/31/2019 1601   CO2 29 03/31/2019 1601   GLUCOSE 94 03/31/2019 1601   GLUCOSE 97 11/17/2006 1103   BUN 15 03/31/2019 1601   BUN 10 09/23/2018   CREATININE 0.50 03/31/2019 1611   CALCIUM 8.8 (L) 03/31/2019 1601   PROT 6.7 03/31/2019 1601   ALBUMIN 3.6 03/31/2019 1601   AST 27 03/31/2019 1601   ALT 11 03/31/2019 1601   ALKPHOS 73 03/31/2019 1601   BILITOT 0.5 03/31/2019 1601   GFRNONAA >60 03/31/2019 1601   GFRAA >60 03/31/2019 1601   COAGS Lab Results  Component Value Date   INR 1.0 03/31/2019   INR 1.05 09/13/2018   INR 1.00 04/04/2012   Lipid Panel    Component Value Date/Time   CHOL 194 04/01/2019 0312   TRIG 75 04/01/2019 0312   HDL 42 04/01/2019 0312   CHOLHDL 4.6 04/01/2019 0312   VLDL 15 04/01/2019 0312   LDLCALC 137 (H) 04/01/2019 0312   HgbA1C  Lab Results  Component Value Date   HGBA1C 5.3 04/01/2019   Urinalysis    Component Value Date/Time   COLORURINE yellow 02/09/2008 1046   APPEARANCEUR Clear 02/09/2008 1046   LABSPEC 1.015 03/29/2009 1440   PHURINE 6.0 03/29/2009 1440   PHURINE 7.5 02/09/2008 1046   HGBUR Negative 03/29/2009 1440   HGBUR negative 02/09/2008 1046   BILIRUBINUR Negative 03/29/2009 1440   KETONESUR Negative 03/29/2009 1440   PROTEINUR Negative 03/29/2009 1440   UROBILINOGEN 0.2 02/09/2008 1046   NITRITE Negative 03/29/2009 1440   NITRITE negative 02/09/2008 1046   LEUKOCYTESUR Trace 03/29/2009 1440   Urine Drug Screen No results found for: LABOPIA, COCAINSCRNUR, LABBENZ, AMPHETMU, THCU, LABBARB  Alcohol Level No results  found for: [facility]   SIGNIFICANT DIAGNOSTIC STUDIES Ct Angio Head W Or Wo Contrast  Result Date: 03/31/2019 CLINICAL DATA:  Code stroke. Aphasia and left-sided facial droop. Last seen normal 1430 hours. EXAM: CT ANGIOGRAPHY HEAD AND NECK TECHNIQUE: Multidetector CT imaging of the head and neck was performed using the standard protocol during bolus administration of intravenous  contrast. Multiplanar CT image reconstructions and MIPs were obtained to evaluate the vascular anatomy. Carotid stenosis measurements (when applicable) are obtained utilizing NASCET criteria, using the distal internal carotid diameter as the denominator. CONTRAST:  58mL OMNIPAQUE IOHEXOL 350 MG/ML SOLN COMPARISON:  Head CT earlier today. FINDINGS: CTA NECK FINDINGS Aortic arch: Aortic atherosclerosis and ectasia. Ascending aorta diameter measures up to 3.7 cm. No dissection. Branching pattern is normal. Right carotid system: Common carotid artery shows atherosclerotic plaque but is widely patent to the bifurcation. There is calcified plaque at the carotid bifurcation. Minimal diameter of the proximal ICA measures 3.5 mm. Compared to a more distal cervical ICA diameter of 5 mm, this indicates a 30% stenosis. Left carotid system: Common carotid artery widely patent to the bifurcation. Calcified plaque at the carotid bifurcation. Minimal diameter of the proximal ICA is 6 mm. There is no stenosis. Upper cervical internal carotid arteries are tortuous and ectatic. Vertebral arteries: Proximal anatomy is poorly seen because of severe venous reflux. Both vertebral artery origins are patent. No stenosis of the dominant left vertebral artery origin. Non dominant right vertebral artery shows stenosis estimated at 50%. Beyond that, the vessels are patent through the cervical region to the foramen magnum. Skeleton: Ordinary cervical spondylosis. Other neck: No lymphadenopathy. Dominant thyroid nodule on the right measuring up to 2.7 cm in diameter.  Thyroid ultrasound recommended at some point. Upper chest: Negative Review of the MIP images confirms the above findings CTA HEAD FINDINGS Anterior circulation: Both internal carotid arteries are patent through the skull base and siphon regions. No siphon stenosis. The anterior and middle cerebral vessels are patent without proximal stenosis, aneurysm or vascular malformation. No missing large or medium vessels are identified. Fetal origin of the left PCA. Posterior circulation: Both vertebral arteries are patent through the foramen magnum to the basilar. No basilar stenosis. Posterior circulation branch vessels are patent without severe stenosis. Venous sinuses: Patent and normal. Anatomic variants: None significant. Delayed phase: No abnormal enhancement. Review of the MIP images confirms the above findings IMPRESSION: No large or medium vessel occlusion. Nonstenotic atherosclerotic change at both carotid bifurcations. 50% stenosis of the right non dominant vertebral artery origin. These results were called by telephone at the time of interpretation on 03/31/2019 at 4:52 pm to Dr. Kerney Elbe , who verbally acknowledged these results. Electronically Signed   By: Nelson Chimes M.D.   On: 03/31/2019 16:58   Ct Angio Neck W Or Wo Contrast  Result Date: 03/31/2019 CLINICAL DATA:  Code stroke. Aphasia and left-sided facial droop. Last seen normal 1430 hours. EXAM: CT ANGIOGRAPHY HEAD AND NECK TECHNIQUE: Multidetector CT imaging of the head and neck was performed using the standard protocol during bolus administration of intravenous contrast. Multiplanar CT image reconstructions and MIPs were obtained to evaluate the vascular anatomy. Carotid stenosis measurements (when applicable) are obtained utilizing NASCET criteria, using the distal internal carotid diameter as the denominator. CONTRAST:  31mL OMNIPAQUE IOHEXOL 350 MG/ML SOLN COMPARISON:  Head CT earlier today. FINDINGS: CTA NECK FINDINGS Aortic arch: Aortic  atherosclerosis and ectasia. Ascending aorta diameter measures up to 3.7 cm. No dissection. Branching pattern is normal. Right carotid system: Common carotid artery shows atherosclerotic plaque but is widely patent to the bifurcation. There is calcified plaque at the carotid bifurcation. Minimal diameter of the proximal ICA measures 3.5 mm. Compared to a more distal cervical ICA diameter of 5 mm, this indicates a 30% stenosis. Left carotid system: Common carotid artery widely patent to the bifurcation. Calcified  plaque at the carotid bifurcation. Minimal diameter of the proximal ICA is 6 mm. There is no stenosis. Upper cervical internal carotid arteries are tortuous and ectatic. Vertebral arteries: Proximal anatomy is poorly seen because of severe venous reflux. Both vertebral artery origins are patent. No stenosis of the dominant left vertebral artery origin. Non dominant right vertebral artery shows stenosis estimated at 50%. Beyond that, the vessels are patent through the cervical region to the foramen magnum. Skeleton: Ordinary cervical spondylosis. Other neck: No lymphadenopathy. Dominant thyroid nodule on the right measuring up to 2.7 cm in diameter. Thyroid ultrasound recommended at some point. Upper chest: Negative Review of the MIP images confirms the above findings CTA HEAD FINDINGS Anterior circulation: Both internal carotid arteries are patent through the skull base and siphon regions. No siphon stenosis. The anterior and middle cerebral vessels are patent without proximal stenosis, aneurysm or vascular malformation. No missing large or medium vessels are identified. Fetal origin of the left PCA. Posterior circulation: Both vertebral arteries are patent through the foramen magnum to the basilar. No basilar stenosis. Posterior circulation branch vessels are patent without severe stenosis. Venous sinuses: Patent and normal. Anatomic variants: None significant. Delayed phase: No abnormal enhancement. Review  of the MIP images confirms the above findings IMPRESSION: No large or medium vessel occlusion. Nonstenotic atherosclerotic change at both carotid bifurcations. 50% stenosis of the right non dominant vertebral artery origin. These results were called by telephone at the time of interpretation on 03/31/2019 at 4:52 pm to Dr. Kerney Elbe , who verbally acknowledged these results. Electronically Signed   By: Nelson Chimes M.D.   On: 03/31/2019 16:58   Mr Brain Wo Contrast  Result Date: 04/01/2019 CLINICAL DATA:  Initial evaluation for acute speech difficulty. EXAM: MRI HEAD WITHOUT CONTRAST TECHNIQUE: Multiplanar, multiecho pulse sequences of the brain and surrounding structures were obtained without intravenous contrast. COMPARISON:  Prior CT and CTA from 03/31/2019 FINDINGS: Brain: Generalized age-related cerebral atrophy. Patchy and confluent T2/FLAIR hyperintensity within the periventricular and deep white matter both cerebral hemispheres most consistent with chronic small vessel ischemic disease, moderate nature. Chronic microvascular ischemic change present within the pons. Superimposed tiny remote right cerebellar infarct noted (series 10, image 5). No abnormal foci of restricted diffusion to suggest acute or subacute ischemia. Gray-white matter differentiation maintained. No other areas of remote cortical infarction. No foci of susceptibility artifact to suggest acute or chronic intracranial hemorrhage. No mass lesion, midline shift or mass effect. Mild diffuse ventricular prominence related to global parenchymal volume loss of hydrocephalus. No extra-axial fluid collection. Pituitary gland suprasellar region normal. Midline structures intact. Vascular: Major intracranial vascular flow voids are maintained. Intracranial arterial dolichoectasia noted. Skull and upper cervical spine: Craniocervical junction normal. Upper cervical spine within normal limits. Bone marrow signal intensity normal. No scalp soft  tissue abnormality. Sinuses/Orbits: Patient status post bilateral ocular lens replacement. Paranasal sinuses are clear. Trace bilateral mastoid effusions, of doubtful significance. Inner ear structures normal. Other: None. IMPRESSION: 1. No acute intracranial abnormality identified. 2. Generalized age-related cerebral atrophy with moderate chronic microvascular ischemic disease. Electronically Signed   By: Jeannine Boga M.D.   On: 04/01/2019 13:29   Ct Head Code Stroke Wo Contrast  Result Date: 03/31/2019 CLINICAL DATA:  Code stroke. Aphasia and left-sided facial droop. Last seen normal 1430 hours EXAM: CT HEAD WITHOUT CONTRAST TECHNIQUE: Contiguous axial images were obtained from the base of the skull through the vertex without intravenous contrast. COMPARISON:  09/13/2018 FINDINGS: Brain: Generalized atrophy with extensive chronic  small-vessel ischemic changes throughout the hemispheric white matter and pons. No sign of acute infarction, mass lesion, hemorrhage, hydrocephalus or extra-axial collection. Vascular: There is atherosclerotic calcification of the major vessels at the base of the brain. Skull: Negative Sinuses/Orbits: Clear/normal Other: None ASPECTS ([place] Stroke Program Early CT Score) - Ganglionic level infarction (caudate, lentiform nuclei, internal capsule, insula, M1-M3 cortex): 7 - Supraganglionic infarction (M4-M6 cortex): 3 Total score (0-10 with 10 being normal): 10 IMPRESSION: 1. Chronic small-vessel ischemic changes and atrophy. No acute finding by CT. 2. ASPECTS is 10. 3. These results were communicated to Dr. Cheral Marker at [place] 4/2/2020by text page via the [facility] messaging system. Electronically Signed   By: Nelson Chimes M.D.   On: 03/31/2019 16:15    2D Echocardiogram  pending     HISTORY OF PRESENT ILLNESS ALLECIA BELLS is an 85 y.o. female who is fully functioning and conversant at baseline, presenting for acute onset of garbled speech. LKW 2:30 PM 03/31/2019 at  home when she acutely had word-finding difficulty and garbled speech. Some gradual improvement occurred at home subsequently over a period of 45 minutes but she did not improve back to baseline. EMS was called and in transit her speech abruptly worsened. She remained aphasic on arrival to the ED bridge. IV tPA was administered. She was admitted to the neuro ICU for further evaluation and treatment.    HOSPITAL COURSE Ms. ABERDEEN HAFEN is a 85 y.o. female with history of HLD and prior TIA presenting with word finding difficulties and garbled speech. TPA administered 03/31/2019 at 1654.  Possible TIA, stroke-like symptoms status post IV TPA with similar episode in 2018  Code Stroke CT head No acute stroke. Small vessel disease. Atrophy. ASPECTS 10.     CTA head & neck no LVO. B ICA atherosclerosis. R VA 50% stenosis origin.   MRI  No acute stroke. Atrophy. SVD.  2D Echo pending   LDL 137  HgbA1c 5.3  aspirin 81 mg daily prior to admission, now on No antithrombotic as within 24h of tPA administration. Given TIA, recommend aspirin 81 mg and plavix 75 mg daily x 3 weeks, then PLAVIX alone. Orders adjusted.   Therapy recommendations:  HH PT and OT (ordered)  Disposition:  return home  Requested outpatient 30-day monitor to look for A. fib as possible source  Plan for d/c later today, once pt out of the 24h tPA window and remains stable. RN to call Dr. Leonie Man for d/c order.   Hyperlipidemia  Home meds:  No statin  LDL 137, goal < 70  Intolerant to statins, muscle pain. Will not add  Continue statin at discharge  Other Stroke Risk Factors  Advanced age  Hx stroke/TIA ? 03/2017 - recurrent transient language difficulties, not felt to be a TIA. Differential included mild cognitive impairment, migraine variant or stress  Other Active Problems  Baseline mild dementia  Hx colon cancer  GERD  LBP   DISCHARGE EXAM Blood pressure 107/88, pulse 61, temperature 98.3 F  (36.8 C), temperature source Oral, resp. rate 17, weight 55 kg, SpO2 96 %. Pleasant elderly Caucasian lady not in distress. . Afebrile. Head is nontraumatic. Neck is supple without bruit.    Cardiac exam no murmur or gallop. Lungs are clear to auscultation. Distal pulses are well felt. Neurological Exam ;  Awake  Alert oriented x 2.diminished attention, registration and recall. Follows one and two-step commands. Normal speech and language.eye movements full without nystagmus.fundi were not visualized. Vision acuity  and fields appear normal. Hearing is significantly diminished bilaterally Palatal movements are normal. Face symmetric. Tongue midline. Normal strength, tone, reflexes and coordination. Normal sensation. Gait deferred.  Discharge Diet   Regular thin liquids  DISCHARGE PLAN  Disposition:  Return home  Home health PT and OT  aspirin 81 mg daily and clopidogrel 75 mg daily for secondary stroke prevention x 3 weeks then PLAVIX alone  Ongoing risk factor control by Primary Care Physician at time of discharge  Follow-up Hoyt Koch, MD in 2 weeks.  Follow-up in [facility] in 4 weeks, office to schedule an appointment.   40 minutes were spent preparing discharge.  Burnetta Sabin, MSN, APRN, ANVP-BC, AGPCNP-BC Advanced Practice Stroke Nurse Lane for Schedule & Pager information 04/01/2019 3:44 PM  I have personally obtained history,examined this patient, reviewed notes, independently viewed imaging studies, participated in medical decision making and plan of care.ROS completed by me personally and pertinent positives fully documented  I have made any additions or clarifications directly to the above note. Agree with note above.    Antony Contras, MD Medical Director [place] Pager: [phone] 04/01/2019 5:16 PM

## 2019-04-01 NOTE — Progress Notes (Signed)
OT Cancellation Note  Patient Details Name: [name] Barker MRN: [id number] DOB: Jan 05, 1921   Cancelled Treatment:    Reason Eval/Treat Not Completed: Active bedrest order   [place], OTR/L Acute Rehab Services Pager [phone] Office [phone]     Almon Register 04/01/2019, 8:09 AM

## 2019-04-01 NOTE — Evaluation (Signed)
Occupational Therapy Evaluation Patient Details Name: [name] Barker MRN: [id number] DOB: Dec 07, 1921 Today's Date: 04/01/2019    History of Present Illness 85 yo admitted with aphasia with CT demonstrating chronic ischemic changes. PMHx: mild dementia,    Clinical Impression   This 85 yo female admitted with above presents to acute with mild decreased balance thus affecting her safety and independence and safety with basic ADLs. She will benefit from acute OT with follow up [place] and 24 hour A/S.      Follow Up Recommendations  Home health OT;Supervision/Assistance - 24 hour    Equipment Recommendations  None recommended by OT       Precautions / Restrictions Precautions Precautions: Fall Restrictions Weight Bearing Restrictions: No      Mobility Bed Mobility Overal bed mobility: Needs Assistance Bed Mobility: Supine to Sit     Supine to sit: Supervision     General bed mobility comments: assist for lines and safety  Transfers Overall transfer level: Needs assistance   Transfers: Sit to/from Stand Sit to Stand: Min guard         General transfer comment: guarding to stand and for lines, RW present to hold in standing    Balance Overall balance assessment: Needs assistance   Sitting balance-Leahy Scale: Fair       Standing balance-Leahy Scale: Poor Standing balance comment: bil UE support on RW for standing and gait                           ADL either performed or assessed with clinical judgement   ADL Overall ADL's : Needs assistance/impaired Eating/Feeding: Independent;Sitting   Grooming: Set up;Sitting;Supervision/safety   Upper Body Bathing: Set up;Sitting;Supervision/ safety   Lower Body Bathing: Min guard;Sit to/from stand   Upper Body Dressing : Supervision/safety;Set up;Sitting   Lower Body Dressing: Min guard;Sit to/from stand   Toilet Transfer: Min guard;Ambulation;RW   Toileting- Water quality scientist and Hygiene:  Min guard;Sit to/from stand               Vision Patient Visual Report: No change from baseline              Pertinent Vitals/Pain Pain Assessment: No/denies pain     Hand Dominance  right   Extremity/Trunk Assessment Upper Extremity Assessment Upper Extremity Assessment: Overall WFL for tasks assessed   Lower Extremity Assessment Lower Extremity Assessment: Generalized weakness   Cervical / Trunk Assessment Cervical / Trunk Assessment: Kyphotic   Communication Communication Communication: HOH   Cognition Arousal/Alertness: Awake/alert Behavior During Therapy: WFL for tasks assessed/performed Overall Cognitive Status: History of cognitive impairments - at baseline                                                [place] expects to be discharged to:: Private residence Living Arrangements: Spouse/significant other;Children Available Help at Discharge: Family;Personal care attendant;Available 24 hours/day Type of Home: House Home Access: Stairs to enter [employer] of Steps: 2 Entrance Stairs-Rails: Right Home Layout: One level     Bathroom Shower/Tub: Occupational psychologist: Standard     Home Equipment: Bedside commode;Walker - 2 wheels;Shower seat          Prior Functioning/Environment Level of Independence: Needs assistance  Gait / Transfers Assistance Needed: RW for ambulation  Comments: Pt caregiver and spouse report pt requiring assistance for washing back and donning/doffing clothing. Pt does not cook or clean for family.        OT Problem List: Impaired balance (sitting and/or standing)      OT Treatment/Interventions: Self-care/ADL training;Balance training;Patient/family education    OT Goals(Current goals can be found in the care plan section) Acute Rehab OT Goals Patient Stated Goal: return home OT Goal Formulation: With patient Time For Goal Achievement:  04/15/19 Potential to Achieve Goals: Good  OT Frequency: Min 2X/week           Co-evaluation PT/OT/SLP Co-Evaluation/Treatment: Yes Reason for Co-Treatment: For patient/therapist safety;To address functional/ADL transfers PT goals addressed during session: Mobility/safety with mobility;Balance OT goals addressed during session: ADL's and self-care;Strengthening/ROM      AM-PAC OT "6 Clicks" Daily Activity     Outcome Measure Help from another person eating meals?: None Help from another person taking care of personal grooming?: A Little Help from another person toileting, which includes using toliet, bedpan, or urinal?: A Little Help from another person bathing (including washing, rinsing, drying)?: A Little Help from another person to put on and taking off regular upper body clothing?: A Little Help from another person to put on and taking off regular lower body clothing?: A Little 6 Click Score: 19   End of Session Equipment Utilized During Treatment: Gait belt;Rolling walker  Activity Tolerance: Patient tolerated treatment well Patient left: in chair;with call bell/phone within reach;with chair alarm set  OT Visit Diagnosis: Unsteadiness on feet (R26.81);Other abnormalities of gait and mobility (R26.89)                Time: 1610-9604 OT Time Calculation (min): 25 min Charges:  OT General Charges $OT Visit: 1 Visit OT Evaluation $OT Eval Moderate Complexity: 1 Mod  [place], OTR/L Acute [employer] Pager [phone] Office [phone]     Almon Register 04/01/2019, 10:05 AM

## 2019-04-01 NOTE — Progress Notes (Signed)
Pt discharged home with son-in-law. Discharge instructions given to patient and family.

## 2019-04-01 NOTE — Evaluation (Signed)
Speech Language Pathology Evaluation Patient Details Name: [name] Barker MRN: [id number] DOB: December 07, 1921 Today's Date: 04/01/2019 Time: 6270-3500 SLP Time Calculation (min) (ACUTE ONLY): 24 min  Problem List:  Patient Active Problem List   Diagnosis Date Noted  . Stroke ([place]) 03/31/2019  . Vasomotor rhinitis 11/17/2018  . Closed fracture of left femur, initial encounter ([place]) 09/13/2018  . Hypokalemia   . Bilateral impacted cerumen 10/30/2017  . Bronchitis 09/21/2017  . Irregular heartbeat 09/21/2017  . Mild dementia ([place]) 08/10/2017  . Sensorineural hearing loss (SNHL), bilateral 08/08/2016  . Bilateral hearing loss 08/07/2016  . Depression 08/07/2016  . Pre-syncope 02/01/2016  . Vitamin D deficiency 11/04/2015  . Fatigue 11/04/2015  . Routine general medical examination at a health care facility 06/08/2015  . TIA (transient ischemic attack) 04/04/2012  . CAD 06/21/2010  . LOW BACK PAIN, CHRONIC 02/04/2010  . ADENOCARCINOMA, COLON 08/19/2007  . HYPERLIPIDEMIA 11/17/2006  . ANEMIA-NOS 11/17/2006  . GERD 11/17/2006  . OSTEOPENIA 11/17/2006   Past Medical History:  Past Medical History:  Diagnosis Date  . Allergic rhinitis   . ANEMIA-NOS 11/17/2006  . Colon cancer ([place]) 08/19/2007   1994 T2, 2008 T3, N1  . DIVERTICULOSIS-COLON 05/30/2010  . Femur fracture, left ([place]) 09/13/2018   CLOSED  . GERD 11/17/2006  . Hemorrhoids   . HYPERLIPIDEMIA 11/17/2006  . LOW BACK PAIN, CHRONIC 02/04/2010  . Osteopenia 11/17/2006  . Small bowel obstruction ([place])   . TIA (transient ischemic attack)    Past Surgical History:  Past Surgical History:  Procedure Laterality Date  . ABDOMINAL HYSTERECTOMY    . APPENDECTOMY    . BACK SURGERY  2007  . BELPHAROPTOSIS REPAIR    . CATARACT EXTRACTION    . HEMICOLECTOMY  1994   Right  . JOINT REPLACEMENT    . KNEE ARTHROSCOPY    . ORIF FEMUR FRACTURE Left 09/13/2018   Procedure: OPEN REDUCTION INTERNAL FIXATION (ORIF) DISTAL FEMUR  FRACTURE;  Surgeon: [name] Needles, MD;  Location: [facility];  Service: Orthopedics;  Laterality: Left;  . PARTIAL COLECTOMY  2008  . ROTATOR CUFF REPAIR    . SHOULDER SURGERY    . VAGINAL PROLAPSE REPAIR     HPI:  Pt is a 85 y.o. female who presented for acute onset of garbled speech. CT of the head was negative. Pt's spouce is currently at our CIR and he reported that the pt has been having episodes of "groping for words" for approximately the last 6 months but could have simple conversations and some forgetfulness has been noted. Pt has had a paid caregiver (daily from 10 to 4pm) for the last 6 months to help with the pt with housekeeping/bathing. Pt's daughter also lives with the pt and her husband but she works several days a week as Merchandiser, retail so pt is home with husband and caregiver while the daughter works.    Assessment / Plan / Recommendation Clinical Impression  Pt reported that her memory is not great and that she has some difficulty with word retrieval of lower frequency words at baseline. Based on today's assessment, her speech and language skills are currently within functional limits. She did demonstrate some difficulty with memory but based on reports from the pt and her husband, this is likely her baseline. Her speech, language, and cognitive skills appear to be within functional limits considering her baseline and the level of support that she has at home. Further skilled SLP services are not clinically indicated at this time.  Pt and nursing were educated regarding results and recommendations; both parties verbalized understanding as well as agreement with plan of care.    SLP Assessment  SLP Recommendation/Assessment: Patient does not need any further Speech Lanaguage Pathology Services SLP Visit Diagnosis: Aphasia (R47.01)    Follow Up Recommendations  None;24 hour supervision/assistance    Frequency and Duration           SLP Evaluation Cognition  Overall Cognitive  Status: History of cognitive impairments - at baseline Orientation Level: Oriented X4 Attention: Focused;Sustained Focused Attention: Appears intact Sustained Attention: Appears intact Memory: Impaired Memory Impairment: Decreased recall of new information Awareness: Appears intact Problem Solving: Appears intact Executive Function: Reasoning Reasoning: Appears intact       Comprehension  Auditory Comprehension Overall Auditory Comprehension: Appears within functional limits for tasks assessed Yes/No Questions: Within Functional Limits Commands: Within Functional Limits Conversation: Complex Reading Comprehension Reading Status: Not tested    Expression Expression Primary Mode of Expression: Verbal Verbal Expression Overall Verbal Expression: Appears within functional limits for tasks assessed Initiation: No impairment Level of Generative/Spontaneous Verbalization: Conversation Repetition: No impairment Naming: No impairment Pragmatics: No impairment   Oral / Motor  Oral Motor/Sensory Function Overall Oral Motor/Sensory Function: Within functional limits Motor Speech Overall Motor Speech: Appears within functional limits for tasks assessed Respiration: Within functional limits Phonation: Normal Resonance: Within functional limits Articulation: Within functional limitis Intelligibility: Intelligible Motor Planning: Witnin functional limits Motor Speech Errors: Not applicable   [name] Barker, [place], [place] Office number [phone] Pager [facility] 04/01/2019, 10:43 AM

## 2019-04-01 NOTE — Progress Notes (Signed)
PT Cancellation Note  Patient Details Name: [name] Barker MRN: [id number] DOB: 1921-01-07   Cancelled Treatment:    Reason Eval/Treat Not Completed: Active bedrest order   Sandy Salaam Briceyda Abdullah 04/01/2019, 6:57 AM  Elwyn Reach, PT Acute Rehabilitation Services Pager: [phone] Office: [phone]

## 2019-04-04 ENCOUNTER — Telehealth: Payer: Self-pay | Admitting: *Deleted

## 2019-04-04 NOTE — Telephone Encounter (Signed)
Transition Care Management Follow-up Telephone Call  How have you been since you were released from the hospital? Spoke with patient's daughter, who states patient is doing "great" she has made a full recovery.   Do you understand why you were in the hospital?  Yes  Do you understand the discharge instrcutions? Yes  Items Reviewed:  Medications reviewed: Yes  Allergies reviewed: Yes  Dietary changes reviewed: Yes Referrals reviewed: Yes   Patient on TCM list presenting for acute onset of garbled speech. LKW 2:30 PM 03/31/2019 at home when she acutely had word-finding difficulty and garbled speech. Some gradual improvement occurredat home subsequentlyovera period of45 minutes but she did not improve back to baseline. EMS was called and in transit her speech abruptly worsened. She remained aphasic on arrival to the ED bridge.IV tPA was administered. She was admitted to the neuro ICU for further evaluation and treatment. Admitted: 03/31/19 Discharged: 04/01/19   Functional Questionnaire:   Activities of Daily Living (ADLs):   She states they are independent in the following: All States they require assistance with the following:None  Any transportation issues/concerns?:   Any patient concerns?No  Confirmed importance and date/time of follow-up visits scheduled: Yes  Virtual visit scheduled with PCP 04/06/19 10:00 am. Jackquline [place] Email sent to [email or web address] per patient's daughter.     Confirmed with patient if condition begins to worsen call PCP or go to the ER.  Patient was given the Call-a-Nurse line [phone]: Yes

## 2019-04-04 NOTE — Telephone Encounter (Signed)
Spoke with daughter, Violeta Gelinas, regarding enrolling her mother with [employer] for them to ship a 30 day cardiac event monitor to their home.  Brief instructions reviewed. [employer] to call to confirm shipping address, monitor should arrive via [employer], review instruction manual in kit, apply monitor and call [employer] to send a baseline recording.  Patient to be enrolled for [employer] to ship a 30 day cardiac event monitor today.

## 2019-04-06 ENCOUNTER — Ambulatory Visit: Payer: Medicare Other | Admitting: Podiatry

## 2019-04-06 ENCOUNTER — Ambulatory Visit (INDEPENDENT_AMBULATORY_CARE_PROVIDER_SITE_OTHER): Payer: Medicare Other | Admitting: Internal Medicine

## 2019-04-06 ENCOUNTER — Encounter: Payer: Self-pay | Admitting: Internal Medicine

## 2019-04-06 DIAGNOSIS — Z8673 Personal history of transient ischemic attack (TIA), and cerebral infarction without residual deficits: Secondary | ICD-10-CM | POA: Diagnosis not present

## 2019-04-06 DIAGNOSIS — M545 Low back pain: Secondary | ICD-10-CM | POA: Diagnosis not present

## 2019-04-06 DIAGNOSIS — G8929 Other chronic pain: Secondary | ICD-10-CM | POA: Diagnosis not present

## 2019-04-06 DIAGNOSIS — R2990 Unspecified symptoms and signs involving the nervous system: Secondary | ICD-10-CM

## 2019-04-06 DIAGNOSIS — I639 Cerebral infarction, unspecified: Principal | ICD-10-CM

## 2019-04-06 NOTE — Assessment & Plan Note (Signed)
Chronic and stable, using tylenol only currently. Advised against NSAIDs while taking aspirin and plavix.

## 2019-04-06 NOTE — Assessment & Plan Note (Addendum)
Taking aspirin and plavix. It is unclear if the lip swelling is related. Asked to retrial the plavix and if recurrent contact us for advice. They understand the aspirin and plavix is for 3 weeks and then continue on plavix alone. She understands 30 day monitor and will complete this once it arrives in mail.

## 2019-04-06 NOTE — Progress Notes (Signed)
Virtual Visit via Video Note  I connected with [name] Barker on 04/06/19 at 10:00 AM EDT by a video enabled telemedicine application and verified that I am speaking with the correct person using two identifiers.   I discussed the limitations of evaluation and management by telemedicine and the availability of in person appointments. The patient expressed understanding and agreed to proceed.  History of Present Illness: The patient is a 85 y.o. female with visit for hospital follow up (presented with aphasia and stroke symptoms inside window for tPA, was given tPA, later CT without acute stroke, monitored appropriately after tPA with resolution of symptoms, advised to take asa 81 mg and plavix daily for 3 weeks then continue on plavix alone). Started on 03/31/19. Was recommended 30 day event monitor outpatient to check for A fib (none found inpatient). Has been taking plavix and aspirin. She did have mild upper lip swelling yesterday and took benadryl with resolution and mild confusion. Daughter lives with her and home health as well to help. Denies falls. Denies cough or SOB or fevers or chills. Denies constipation or diarrhea. Speech is good. Overall it is resolved.   PMH, [place], social history reviewed and updated  Observations/Objective: Appearance: normal, breathing appears normal, casual grooming, abdomen does not appear distended, throat not examined, memory impaired, mental status is alert and oriented times 2  Assessment and Plan: See problem oriented charting  Follow Up Instructions: continue plavix and aspirin for 3 weeks, let us know if any more lip swelling with plavix, then after 3 weeks take plavix alone and stop aspirin  I discussed the assessment and treatment plan with the patient. The patient was provided an opportunity to ask questions and all were answered. The patient agreed with the plan and demonstrated an understanding of the instructions.   The patient was advised to call  back or seek an in-person evaluation if the symptoms worsen or if the condition fails to improve as anticipated.  Hoyt Koch, MD

## 2019-04-07 ENCOUNTER — Encounter (INDEPENDENT_AMBULATORY_CARE_PROVIDER_SITE_OTHER): Payer: Medicare Other

## 2019-04-07 DIAGNOSIS — I639 Cerebral infarction, unspecified: Secondary | ICD-10-CM | POA: Diagnosis not present

## 2019-04-07 DIAGNOSIS — I4891 Unspecified atrial fibrillation: Secondary | ICD-10-CM

## 2019-04-07 DIAGNOSIS — G459 Transient cerebral ischemic attack, unspecified: Secondary | ICD-10-CM | POA: Diagnosis not present

## 2019-04-20 ENCOUNTER — Emergency Department (HOSPITAL_COMMUNITY)
Admission: EM | Admit: 2019-04-20 | Discharge: 2019-04-20 | Disposition: A | Discharge status: Home / Self Care | Payer: Medicare Other | Attending: Emergency Medicine | Admitting: Emergency Medicine

## 2019-04-20 ENCOUNTER — Other Ambulatory Visit: Payer: Self-pay

## 2019-04-20 ENCOUNTER — Emergency Department (HOSPITAL_COMMUNITY): Payer: Medicare Other

## 2019-04-20 DIAGNOSIS — Z7982 Long term (current) use of aspirin: Secondary | ICD-10-CM | POA: Insufficient documentation

## 2019-04-20 DIAGNOSIS — R4701 Aphasia: Secondary | ICD-10-CM

## 2019-04-20 DIAGNOSIS — R4702 Dysphasia: Secondary | ICD-10-CM | POA: Diagnosis not present

## 2019-04-20 DIAGNOSIS — Z7901 Long term (current) use of anticoagulants: Secondary | ICD-10-CM | POA: Insufficient documentation

## 2019-04-20 DIAGNOSIS — R000 Tachycardia, unspecified: Secondary | ICD-10-CM | POA: Diagnosis not present

## 2019-04-20 DIAGNOSIS — Z8673 Personal history of transient ischemic attack (TIA), and cerebral infarction without residual deficits: Secondary | ICD-10-CM | POA: Insufficient documentation

## 2019-04-20 DIAGNOSIS — R410 Disorientation, unspecified: Secondary | ICD-10-CM | POA: Diagnosis not present

## 2019-04-20 DIAGNOSIS — F801 Expressive language disorder: Secondary | ICD-10-CM | POA: Insufficient documentation

## 2019-04-20 DIAGNOSIS — Z79899 Other long term (current) drug therapy: Secondary | ICD-10-CM | POA: Insufficient documentation

## 2019-04-20 DIAGNOSIS — I499 Cardiac arrhythmia, unspecified: Secondary | ICD-10-CM | POA: Diagnosis not present

## 2019-04-20 DIAGNOSIS — R29818 Other symptoms and signs involving the nervous system: Secondary | ICD-10-CM | POA: Diagnosis not present

## 2019-04-20 DIAGNOSIS — Z85038 Personal history of other malignant neoplasm of large intestine: Secondary | ICD-10-CM | POA: Insufficient documentation

## 2019-04-20 DIAGNOSIS — R569 Unspecified convulsions: Secondary | ICD-10-CM | POA: Diagnosis not present

## 2019-04-20 LAB — DIFFERENTIAL
Abs Immature Granulocytes: 0.01 10*3/uL (ref 0.00–0.07)
Basophils Absolute: 0 10*3/uL (ref 0.0–0.1)
Basophils Relative: 1 %
Eosinophils Absolute: 0.1 10*3/uL (ref 0.0–0.5)
Eosinophils Relative: 2 %
Immature Granulocytes: 0 %
Lymphocytes Relative: 37 %
Lymphs Abs: 2.1 10*3/uL (ref 0.7–4.0)
Monocytes Absolute: 0.7 10*3/uL (ref 0.1–1.0)
Monocytes Relative: 12 %
Neutro Abs: 2.8 10*3/uL (ref 1.7–7.7)
Neutrophils Relative %: 48 %

## 2019-04-20 LAB — URINALYSIS, ROUTINE W REFLEX MICROSCOPIC
Bacteria, UA: NONE SEEN
Bilirubin Urine: NEGATIVE
Glucose, UA: NEGATIVE mg/dL
Hgb urine dipstick: NEGATIVE
Ketones, ur: NEGATIVE mg/dL
Nitrite: NEGATIVE
Protein, ur: NEGATIVE mg/dL
Specific Gravity, Urine: 1.01 (ref 1.005–1.030)
pH: 7 (ref 5.0–8.0)

## 2019-04-20 LAB — COMPREHENSIVE METABOLIC PANEL
ALT: 13 U/L (ref 0–44)
AST: 24 U/L (ref 15–41)
Albumin: 3.4 g/dL — ABNORMAL LOW (ref 3.5–5.0)
Alkaline Phosphatase: 75 U/L (ref 38–126)
Anion gap: 8 (ref 5–15)
BUN: 12 mg/dL (ref 8–23)
CO2: 28 mmol/L (ref 22–32)
Calcium: 8.9 mg/dL (ref 8.9–10.3)
Chloride: 98 mmol/L (ref 98–111)
Creatinine, Ser: 0.56 mg/dL (ref 0.44–1.00)
GFR calc Af Amer: >60 mL/min (ref >60)
GFR calc non Af Amer: >60 mL/min (ref >60)
Glucose, Bld: 99 mg/dL (ref 70–99)
Potassium: 4.4 mmol/L (ref 3.5–5.1)
Sodium: 134 mmol/L — ABNORMAL LOW (ref 135–145)
Total Bilirubin: 0.6 mg/dL (ref 0.3–1.2)
Total Protein: 6.4 g/dL — ABNORMAL LOW (ref 6.5–8.1)

## 2019-04-20 LAB — RAPID URINE DRUG SCREEN, HOSP PERFORMED
Amphetamines: NOT DETECTED
Barbiturates: NOT DETECTED
Benzodiazepines: NOT DETECTED
Cocaine: NOT DETECTED
Opiates: NOT DETECTED
Tetrahydrocannabinol: NOT DETECTED

## 2019-04-20 LAB — CBC
HCT: 35.2 % — ABNORMAL LOW (ref 36.0–46.0)
Hemoglobin: 11.1 g/dL — ABNORMAL LOW (ref 12.0–15.0)
MCH: 29.2 pg (ref 26.0–34.0)
MCHC: 31.5 g/dL (ref 30.0–36.0)
MCV: 92.6 fL (ref 80.0–100.0)
Platelets: 227 10*3/uL (ref 150–400)
RBC: 3.8 MIL/uL — ABNORMAL LOW (ref 3.87–5.11)
RDW: 12.8 % (ref 11.5–15.5)
WBC: 5.8 10*3/uL (ref 4.0–10.5)
nRBC: 0 % (ref 0.0–0.2)

## 2019-04-20 LAB — PROTIME-INR
INR: 1 (ref 0.8–1.2)
Prothrombin Time: 13.5 seconds (ref 11.4–15.2)

## 2019-04-20 LAB — ETHANOL: Alcohol, Ethyl (B): <10 mg/dL (ref <10)

## 2019-04-20 LAB — APTT: aPTT: 29 seconds (ref 24–36)

## 2019-04-20 LAB — I-STAT CREATININE, ED: Creatinine, Ser: 0.5 mg/dL (ref 0.44–1.00)

## 2019-04-20 LAB — CBG MONITORING, ED: Glucose-Capillary: 95 mg/dL (ref 70–99)

## 2019-04-20 MED ORDER — LEVETIRACETAM 500 MG PO TABS: 500.0000 mg | ORAL_TABLET | Freq: Two times a day (BID) | ORAL | 0 refills | Status: DC

## 2019-04-20 MED ORDER — SODIUM CHLORIDE 0.9 % IV SOLN
750.0000 mg | INTRAVENOUS | Status: AC
  Administered 2019-04-20: 750 mg via INTRAVENOUS
  Filled 2019-04-20: qty 7.5

## 2019-04-20 NOTE — ED Notes (Signed)
Patient verbalizes understanding of discharge instructions. Opportunity for questioning and answers were provided. Armband removed by staff, pt discharged from ED.  

## 2019-04-20 NOTE — ED Triage Notes (Signed)
Pt here from home 1430 expressive aphasia. Hx stroke 2 weeks ago. Symptoms resolved on EMS arrival. No complaints on arrival to ED.

## 2019-04-20 NOTE — Discharge Instructions (Addendum)
You have been diagnosed today with difficulty speaking.  At this time there does not appear to be the presence of an emergent medical condition, however there is always the potential for conditions to change. Please read and follow the below instructions.  Please return to the Emergency Department immediately for any new or worsening symptoms. Please be sure to follow up with your Primary Care Provider within one week regarding your visit today; please call their office to schedule an appointment even if you are feeling better for a follow-up visit. Please begin taking Keppra as prescribed 500mg  twice daily.  Please call your neurologist tomorrow morning to schedule a follow-up appointment regarding your visit today.  Please avoid driving, swimming, climbing ladders or any other dangerous activities until you are reevaluated by the neurologist as another episode would be very dangerous and cause injury/harm to yourself or others.  Get help right away if you have: Any symptoms of a stroke. "BE FAST" is an easy way to remember the main warning signs of a stroke: B - Balance. Signs are dizziness, sudden trouble walking, or loss of balance. E - Eyes. Signs are trouble seeing or a sudden change in vision. F - Face. Signs are a sudden weakness or numbness of the face, or the face or eyelid drooping on one side. A - Arms. Signs are weakness or numbness in an arm. This happens suddenly and usually on one side of the body. S - Speech. Signs are sudden trouble speaking, slurred speech, or trouble understanding what people say. T - Time. Time to call emergency service. Write down what time symptoms started. Other signs of a stroke, such as: A sudden, severe headache with no known cause. Nausea or vomiting. Seizure.  Please read the additional information packets attached to your discharge summary.  Do not take your medicine if  develop an itchy rash, swelling in your mouth or lips, or difficulty  breathing.

## 2019-04-20 NOTE — Consult Note (Addendum)
Neurology Consultation  Reason for Consult: Confusion Referring Physician: DR Wilson Singer  CC: Confusion and difficulty standing  History is obtained from: Nurse and EMS  HPI: [name] Barker is a 85 y.o. female with history of TIA, small bowel obstruction, upper lipidemia, colon cancer, osteopenia.  Patient was brought to [facility] secondary to confusion.  Once in the ED her confusion resolved however when nurse went back to bedside she seemed to have difficulty understanding what the nurse was saying.  Code stroke was initiated.  Patient was immediately brought back to the CT however there was no large vessel MCA sign or hemorrhage.  Patient at that time had resolved.  At that time patient's NIH scale was 0.  Patient had been in the emergency department 3 weeks ago.  She was in the emergency department and was given TPA for the same symptoms.  At that time MRI did not show any acute stroke.  Patient's LDL was 137, A1c was 5.3, CTA of head and neck showed no LVO.  Patient was discharged on aspirin 81 and Plavix 75 mg daily for 3 weeks.  Patient was given a 30-day cardiac heart monitor to evaluate for possible A. fib.  At this time patient is wearing her heart monitor.  ED course CT head/labs  LKW: 1557 tpa given?: no, symptoms resolved Premorbid modified Rankin scale (mRS): 1 NIH stroke score of 0   ROS: A 14 point ROS was performed and is negative except as noted in the HPI.   Past Medical History:  Diagnosis Date  . Allergic rhinitis   . ANEMIA-NOS 11/17/2006  . Colon cancer ([place]) 08/19/2007   1994 T2, 2008 T3, N1  . DIVERTICULOSIS-COLON 05/30/2010  . Femur fracture, left ([place]) 09/13/2018   CLOSED  . GERD 11/17/2006  . Hemorrhoids   . HYPERLIPIDEMIA 11/17/2006  . LOW BACK PAIN, CHRONIC 02/04/2010  . Osteopenia 11/17/2006  . Small bowel obstruction ([place])   . TIA (transient ischemic attack)       Family History  Problem Relation Age of Onset  . Lung cancer Sister   . Colon  cancer Sister 43  . Kidney cancer Sister     Social History:   reports that she has never smoked. She has never used smokeless tobacco. She reports that she does not drink alcohol or use drugs.  Medications No current facility-administered medications for this encounter.   Current Outpatient Medications:  .  acetaminophen (TYLENOL) 500 MG tablet, Take 500 mg by mouth 2 (two) times a day. FOR BACK PAIN, Disp: , Rfl:  .  albuterol (PROAIR HFA) 108 (90 Base) MCG/ACT inhaler, INHALE 2 PUFFS INTO THE LUNGS 2 (TWO) TIMES DAILY AS NEEDED. FOR WHEEZING (Patient taking differently: Inhale 2 puffs into the lungs 2 (two) times daily as needed for wheezing. ), Disp: 8.5 Inhaler, Rfl: 4 .  Cholecalciferol (VITAMIN D-3) 25 MCG (1000 UT) CAPS, Take 1,000 Units by mouth daily., Disp: , Rfl:  .  citalopram (CELEXA) 10 MG tablet, Take 1 tablet (10 mg total) by mouth daily. Need annual visit for further refills (Patient taking differently: Take 10 mg by mouth daily. ), Disp: 90 tablet, Rfl: 0 .  clopidogrel (PLAVIX) 75 MG tablet, Take 1 tablet (75 mg total) by mouth daily., Disp: 30 tablet, Rfl: 2 .  Cranberry-Cholecalciferol 4200-500 MG-UNIT CAPS, Take 1 capsule by mouth 2 (two) times a day., Disp: , Rfl:  .  docusate sodium (COLACE) 100 MG capsule, Take 100 mg by mouth 2 (  two) times daily., Disp: , Rfl:  .  glycerin adult (GLYCERIN ADULT) 2 G SUPP, Place 1 suppository rectally daily as needed for mild constipation or moderate constipation. , Disp: , Rfl:  .  Misc Natural Products (TOTAL MEMORY & FOCUS FORMULA) TABS, Take 1 tablet by mouth 2 (two) times a day., Disp: , Rfl:  .  Multiple Vitamins-Minerals (PRESERVISION AREDS 2) CAPS, Take 1 capsule by mouth 2 (two) times a day., Disp: , Rfl:  .  Probiotic Product ([employer] SENIOR PROBIOTIC) CAPS, Take 1 capsule by mouth daily. , Disp: , Rfl:  .  VOLTAREN 1 % GEL, APPLY TO AFFECTED AREA EVERY DAY AS NEEDED BACK PAIN (Patient taking differently: Apply 2 g topically  daily as needed (back pain). ), Disp: 400 g, Rfl: 5 .  aspirin EC 81 MG EC tablet, Take 1 tablet (81 mg total) by mouth daily for 21 days. Take with plavix for 21 days then stop and take plavix alone. (Patient not taking: Reported on 04/20/2019), Disp: , Rfl:  .  Multiple Vitamin (MULTIVITAMIN WITH MINERALS) TABS tablet, Take 1 tablet by mouth daily. (Patient not taking: Reported on 04/20/2019), Disp: 30 tablet, Rfl: 0   Exam: Current vital signs: BP (!) 152/56   Pulse 62   Temp 97.9 F (36.6 C) (Oral)   Resp 16   SpO2 97%  Vital signs in last 24 hours: Temp:  [97.9 F (36.6 C)] 97.9 F (36.6 C) (04/22 1549) Pulse Rate:  [62-64] 62 (04/22 1600) Resp:  [16-18] 16 (04/22 1600) BP: (152-158)/(56-59) 152/56 (04/22 1600) SpO2:  [97 %-98 %] 97 % (04/22 1600)  Physical Exam  Constitutional: Appears well-developed and well-nourished.  Psych: Affect appropriate to situation Eyes: No scleral injection HENT: No OP obstrucion Head: Normocephalic.  Cardiovascular: Normal rate and regular rhythm.  Respiratory: Effort normal, non-labored breathing GI: Soft.  No distension. There is no tenderness.  Skin: WDI  Neuro: Mental Status: Patient is awake, alert, oriented to person, place, . No signs of aphasia or neglect or aphasia Cranial Nerves: II: Visual Fields are full.  III,IV, VI: EOMI without ptosis or diploplia. Pupils equal, round and reactive to light V: Facial sensation is symmetric to temperature VII: Facial movement is symmetric.  VIII: hearing decreased at baseline and wears hearing aids X: Palat elevates symmetrically XI: Shoulder shrug is symmetric. XII: tongue is midline without atrophy or fasciculations.  Motor: Tone is normal. Bulk is normal. 5/5 strength was present in all four extremities.  Sensory: Sensation is symmetric to light touch and temperature in the arms and legs. Plantars: Mute bilaterally Cerebellar: FNF and HKS are intact bilaterally  Labs I have  reviewed labs in epic and the results pertinent to this consultation are: CBC    Component Value Date/Time   WBC 5.8 04/20/2019 1607   RBC 3.80 (L) 04/20/2019 1607   HGB 11.1 (L) 04/20/2019 1607   HGB 11.6 02/10/2012 1145   HCT 35.2 (L) 04/20/2019 1607   HCT 34.8 02/10/2012 1145   PLT 227 04/20/2019 1607   PLT 228 02/10/2012 1145   MCV 92.6 04/20/2019 1607   MCV 91.9 02/10/2012 1145   MCH 29.2 04/20/2019 1607   MCHC 31.5 04/20/2019 1607   RDW 12.8 04/20/2019 1607   RDW 12.9 02/10/2012 1145   LYMPHSABS 2.1 04/20/2019 1607   LYMPHSABS 2.0 02/10/2012 1145   MONOABS 0.7 04/20/2019 1607   MONOABS 0.6 02/10/2012 1145   EOSABS 0.1 04/20/2019 1607   EOSABS 0.1 02/10/2012 1145  BASOSABS 0.0 04/20/2019 1607   BASOSABS 0.0 02/10/2012 1145   CMP     Component Value Date/Time   NA 134 (L) 03/31/2019 1609   NA 134 (A) 09/23/2018   K 4.2 03/31/2019 1609   CL 99 03/31/2019 1601   CO2 29 03/31/2019 1601   GLUCOSE 94 03/31/2019 1601   GLUCOSE 97 11/17/2006 1103   BUN 15 03/31/2019 1601   BUN 10 09/23/2018   CREATININE 0.50 04/20/2019 1617   CALCIUM 8.8 (L) 03/31/2019 1601   PROT 6.7 03/31/2019 1601   ALBUMIN 3.6 03/31/2019 1601   AST 27 03/31/2019 1601   ALT 11 03/31/2019 1601   ALKPHOS 73 03/31/2019 1601   BILITOT 0.5 03/31/2019 1601   GFRNONAA >60 03/31/2019 1601   GFRAA >60 03/31/2019 1601   Lipid Panel     Component Value Date/Time   CHOL 194 04/01/2019 0312   TRIG 75 04/01/2019 0312   HDL 42 04/01/2019 0312   CHOLHDL 4.6 04/01/2019 0312   VLDL 15 04/01/2019 0312   LDLCALC 137 (H) 04/01/2019 0312   LDLDIRECT 133.6 09/26/2013 0955    Imaging I have reviewed the images obtained: CT-scan of the brain- no acute intracranial abnormality.  Atrophy and chronic microvascular ischemic changes in the white matter.  Etta Quill PA-C [facility] [phone]  M-F  (9:00 am- 5:00 PM)  04/20/2019, 4:34 PM    Attending addendum Patient seen and examined as an  acute code stroke.  Brought from home for concerns for aphasia. Recent admission for stroke/TIA status post TPA for similar episodes. Recent MRI during that admission was negative for acute process. TIA versus aborted stroke versus confusion was the discharge diagnosis On my examination, she has a assessment-NIH stroke scale 0. [place] reviewed personally - neegative for acute process  Assessment 86 year old female with transient confusion and difficulty understanding nurse.  At this time her symptoms have fully resolved.  Patient was recently in the hospital 3 weeks ago and had a full stroke work-up which was negative.  She is currently wearing a Holter monitor to evaluate for any form of arrhythmia.  At this point NIH stroke scale is 0.  She currently is on aspirin and Plavix for 3 weeks. Due to the stereotypic nature of episodes, more concern for this being seizures rather than stroke. She is going to be seen in the outpatient setting by neurologist, so I would not admit her for any further work-up but would initiate antiepileptics at this time.  Impression: Transient aphasia Stereotypic episodes of transient aphasia Concern for seizure-second episode.  Recommendations: From a stroke prevention standpoint-continue aspirin and Plavix  From a concern for seizure perspective: -Loaded with Keppra 750 IV now -Discharge home with Keppra 500 twice daily -Check urinalysis to ensure there is no underlying UTI-otherwise labs look unremarkable -EEG can be done as an outpatient.  Outpatient neurology follow-up in 2 to 4 weeks  Discussed with the EDP.  -- Amie Portland, MD [facility] Pager: [phone] If 7pm to 7am, please call on call as listed on AMION.   Addendum I was able to speak to patient's daughter Ms. [name], who is a Designer, jewellery herself, on the phone and updated her with the plan. They had a question about possibly repeating MRI.  I told him that with a  neurological exam back to baseline, and recent work-up including the MRI, and heart monitoring etc. in place-new MRI would not really change the course of action hence is not absolutely necessary. Family was  satisfied with the response.  They were given the opportunity to ask questions and all their concerns and questions were answered to the best of my ability. -- Amie Portland, MD [facility] Pager: [phone] If 7pm to 7am, please call on call as listed on AMION.

## 2019-04-20 NOTE — ED Notes (Signed)
Patient was encoded by EMS for confusion and recent stroke-EMS stated that all symptoms resolved and no code stroke activated. Patient became confused once in ED shortly after arrival. Oriented to person only on RN assessment. PA at bedside and Code Stroke activated. LKW 1554

## 2019-04-20 NOTE — ED Notes (Signed)
Pt ambulated to bathroom with assistance from staff. Steady gait.

## 2019-04-20 NOTE — Code Documentation (Signed)
85yo female arriving to [facility] via [place] at 56. Patient with recent similar symptoms on 03/31/2019 for which she was treated with tPA. MRI negative for acute infarct at that time. Today patient was LKW at 1430 and developed confusion. Code stroke called in the ED. Stroke team responded to CT. NIHSS 1, see documentation for details and code stroke times. Patient missed one question. No acute stroke treatment at this time. Code stroke canceled. Bedside handoff with ED RN Judson Roch.

## 2019-04-20 NOTE — ED Provider Notes (Signed)
[facility] EMERGENCY DEPARTMENT Provider Note   CSN: [id number] Arrival date & time: 04/20/19  1535   History   Chief Complaint Chief Complaint  Patient presents with  . Transient Ischemic Attack    HPI [name] Barker is a 85 y.o. female presenting today via EMS for expressive aphasia.  Per nursing staff patient with sudden onset of expressive aphasia at 2:30 PM today, EMS was called.  Upon EMS arrival they report that patient's symptoms had completely resolved.  Upon my initial evaluation patient with expressive aphasia unable to speak her name, date or reason that she was brought in per nursing staff patient is normally fully alert and oriented.  Patient was seen on 03/31/2027 for TIA with similar symptoms of slurred speech    HPI  Past Medical History:  Diagnosis Date  . Allergic rhinitis   . ANEMIA-NOS 11/17/2006  . Colon cancer ([place]) 08/19/2007   1994 T2, 2008 T3, N1  . DIVERTICULOSIS-COLON 05/30/2010  . Femur fracture, left ([place]) 09/13/2018   CLOSED  . GERD 11/17/2006  . Hemorrhoids   . HYPERLIPIDEMIA 11/17/2006  . LOW BACK PAIN, CHRONIC 02/04/2010  . Osteopenia 11/17/2006  . Small bowel obstruction ([place])   . TIA (transient ischemic attack)     Patient Active Problem List   Diagnosis Date Noted  . Stroke-like episode ([place]) s/p tPA 03/31/2019  . Vasomotor rhinitis 11/17/2018  . Closed fracture of left femur, initial encounter ([place]) 09/13/2018  . Hypokalemia   . Bilateral impacted cerumen 10/30/2017  . Irregular heartbeat 09/21/2017  . Mild dementia ([place]) 08/10/2017  . Sensorineural hearing loss (SNHL), bilateral 08/08/2016  . Bilateral hearing loss 08/07/2016  . Depression 08/07/2016  . Pre-syncope 02/01/2016  . Vitamin D deficiency 11/04/2015  . Fatigue 11/04/2015  . Routine general medical examination at a health care facility 06/08/2015  . TIA (transient ischemic attack) 04/04/2012  . CAD 06/21/2010  . LOW BACK PAIN, CHRONIC 02/04/2010   . ADENOCARCINOMA, COLON 08/19/2007  . Hyperlipidemia 11/17/2006  . ANEMIA-NOS 11/17/2006  . GERD 11/17/2006  . OSTEOPENIA 11/17/2006    Past Surgical History:  Procedure Laterality Date  . ABDOMINAL HYSTERECTOMY    . APPENDECTOMY    . BACK SURGERY  2007  . BELPHAROPTOSIS REPAIR    . CATARACT EXTRACTION    . HEMICOLECTOMY  1994   Right  . JOINT REPLACEMENT    . KNEE ARTHROSCOPY    . ORIF FEMUR FRACTURE Left 09/13/2018   Procedure: OPEN REDUCTION INTERNAL FIXATION (ORIF) DISTAL FEMUR FRACTURE;  Surgeon: Shona Needles, MD;  Location: [facility];  Service: Orthopedics;  Laterality: Left;  . PARTIAL COLECTOMY  2008  . ROTATOR CUFF REPAIR    . SHOULDER SURGERY    . VAGINAL PROLAPSE REPAIR       OB History   No obstetric history on file.      Home Medications    Prior to Admission medications   Medication Sig Start Date End Date Taking? Authorizing Provider  acetaminophen (TYLENOL) 500 MG tablet Take 500 mg by mouth 2 (two) times a day. FOR BACK PAIN   Yes [provider]  albuterol (PROAIR HFA) 108 (90 Base) MCG/ACT inhaler INHALE 2 PUFFS INTO THE LUNGS 2 (TWO) TIMES DAILY AS NEEDED. FOR WHEEZING Patient taking differently: Inhale 2 puffs into the lungs 2 (two) times daily as needed for wheezing.  11/17/18  Yes Hoyt Koch, MD  Cholecalciferol (VITAMIN D-3) 25 MCG (1000 UT) CAPS Take 1,000 Units by  mouth daily.   Yes [provider]  citalopram (CELEXA) 10 MG tablet Take 1 tablet (10 mg total) by mouth daily. Need annual visit for further refills Patient taking differently: Take 10 mg by mouth daily.  03/18/19  Yes Hoyt Koch, MD  clopidogrel (PLAVIX) 75 MG tablet Take 1 tablet (75 mg total) by mouth daily. 04/01/19  Yes Donzetta Starch, NP  Cranberry-Cholecalciferol 4200-500 MG-UNIT CAPS Take 1 capsule by mouth 2 (two) times a day.   Yes [provider]  docusate sodium (COLACE) 100 MG capsule Take 100 mg by mouth 2 (two) times daily.    Yes [provider]  glycerin adult (GLYCERIN ADULT) 2 G SUPP Place 1 suppository rectally daily as needed for mild constipation or moderate constipation.    Yes [provider]  Misc Natural Products (TOTAL MEMORY & FOCUS FORMULA) TABS Take 1 tablet by mouth 2 (two) times a day.   Yes [provider]  Multiple Vitamins-Minerals (PRESERVISION AREDS 2) CAPS Take 1 capsule by mouth 2 (two) times a day.   Yes [provider]  Probiotic Product ([employer] SENIOR PROBIOTIC) CAPS Take 1 capsule by mouth daily.    Yes [provider]  VOLTAREN 1 % GEL APPLY TO AFFECTED AREA EVERY DAY AS NEEDED BACK PAIN Patient taking differently: Apply 2 g topically daily as needed (back pain).    Yes Swords, Darrick Penna, MD  aspirin EC 81 MG EC tablet Take 1 tablet (81 mg total) by mouth daily for 21 days. Take with plavix for 21 days then stop and take plavix alone. Patient not taking: Reported on 04/20/2019 04/01/19 04/22/19  Donzetta Starch, NP  levETIRAcetam (KEPPRA) 500 MG tablet Take 1 tablet (500 mg total) by mouth 2 (two) times daily. 04/20/19   Virgel Manifold, MD  Multiple Vitamin (MULTIVITAMIN WITH MINERALS) TABS tablet Take 1 tablet by mouth daily. Patient not taking: Reported on 04/20/2019 09/16/18   Florencia Reasons, MD    Family History Family History  Problem Relation Age of Onset  . Lung cancer Sister   . Colon cancer Sister 83  . Kidney cancer Sister     Social History Social History   Tobacco Use  . Smoking status: Never Smoker  . Smokeless tobacco: Never Used  Substance Use Topics  . Alcohol use: No  . Drug use: No     Allergies   Flexeril [cyclobenzaprine]; Methenamine mandelate; Penicillins; Promethazine hcl; Statins; and Morphine sulfate   Review of Systems Review of Systems  Constitutional: Negative.  Negative for chills and fever.  Respiratory: Negative.  Negative for cough and shortness of breath.   Cardiovascular: Negative.  Negative for chest pain.   Gastrointestinal: Negative.  Negative for abdominal pain, nausea and vomiting.  Neurological: Positive for speech difficulty. Negative for dizziness, syncope, weakness, numbness and headaches.  All other systems reviewed and are negative.   Physical Exam Updated Vital Signs BP (!) 163/66   Pulse (!) 57   Temp 97.9 F (36.6 C) (Oral)   Resp 18   SpO2 98%   Physical Exam Constitutional:      General: She is not in acute distress.    Appearance: Normal appearance. She is well-developed. She is not ill-appearing or diaphoretic.  HENT:     Head: Normocephalic and atraumatic.     Right Ear: External ear normal.     Left Ear: External ear normal.     Nose: Nose normal.     Mouth/Throat:  Mouth: Mucous membranes are moist.     Pharynx: Oropharynx is clear.  Eyes:     General: Vision grossly intact. Gaze aligned appropriately.     Pupils: Pupils are equal, round, and reactive to light.  Neck:     Musculoskeletal: Normal range of motion.     Trachea: Trachea and phonation normal. No tracheal deviation.  Cardiovascular:     Rate and Rhythm: Normal rate and regular rhythm.     Pulses: Normal pulses.  Pulmonary:     Effort: Pulmonary effort is normal. No respiratory distress.     Breath sounds: Normal breath sounds.  Abdominal:     General: There is no distension.     Palpations: Abdomen is soft.     Tenderness: There is no abdominal tenderness. There is no guarding or rebound.  Musculoskeletal: Normal range of motion.        General: No tenderness.     Right lower leg: No edema.     Left lower leg: No edema.  Skin:    General: Skin is warm and dry.  Neurological:     Mental Status: She is alert.     GCS: GCS eye subscore is 4. GCS verbal subscore is 3. GCS motor subscore is 6.     Comments: Mental Status: Alert, confused verbal response unable to state her name or why she is brought to the emergency department.  She reports that the year is 2001.  Able to follow two-step  commands without difficulty. Cranial Nerves: II: Peripheral visual fields grossly normal, pupils equal, round, reactive to light III,IV, VI: ptosis not present, extra-ocular motions intact bilaterally V,VII: smile symmetric, eyebrows raise symmetric, facial light touch sensation equal VIII: hearing grossly normal to voice X: uvula elevates symmetrically XI: bilateral shoulder shrug symmetric and strong XII: midline tongue extension without fassiculations Motor: Normal tone. 5/5 strength in upper and lower extremities bilaterally including strong and equal grip strength and dorsiflexion/plantar flexion Sensory: Sensation intact to light touch in all extremities.  Deep Tendon Reflexes: 2+ and symmetric in patella Cerebellar: normal finger-to-nose with bilateral upper extremities. No pronator drift.  CV: distal pulses palpable throughout  Psychiatric:        Behavior: Behavior normal.      ED Treatments / Results  Labs (all labs ordered are listed, but only abnormal results are displayed) Labs Reviewed  CBC - Abnormal; Notable for the following components:      Result Value   RBC 3.80 (*)    Hemoglobin 11.1 (*)    HCT 35.2 (*)    All other components within normal limits  COMPREHENSIVE METABOLIC PANEL - Abnormal; Notable for the following components:   Sodium 134 (*)    Total Protein 6.4 (*)    Albumin 3.4 (*)    All other components within normal limits  URINALYSIS, ROUTINE W REFLEX MICROSCOPIC - Abnormal; Notable for the following components:   Leukocytes,Ua SMALL (*)    All other components within normal limits  ETHANOL  PROTIME-INR  APTT  DIFFERENTIAL  RAPID URINE DRUG SCREEN, HOSP PERFORMED  I-STAT CREATININE, ED  CBG MONITORING, ED    EKG EKG Interpretation  Date/Time:  Wednesday April 20 2019 15:47:25 EDT Ventricular Rate:  65 PR Interval:    QRS Duration: 95 QT Interval:  429 QTC Calculation: 447 R Axis:   -61 Text Interpretation:  Sinus rhythm  Left anterior fascicular block Abnormal R-wave progression, early transition Confirmed by Virgel Manifold [phone]) on 04/20/2019 4:09:48 PM  Radiology Ct Head Code Stroke Wo Contrast  Result Date: 04/20/2019 CLINICAL DATA:  Code stroke. Acute onset expressive aphasia today. History of stroke EXAM: CT HEAD WITHOUT CONTRAST TECHNIQUE: Contiguous axial images were obtained from the base of the skull through the vertex without intravenous contrast. COMPARISON:  MRI head 04/01/2019 FINDINGS: Brain: Moderate atrophy. Chronic microvascular ischemic changes in the white matter. Negative for acute cortical infarct. Negative for hemorrhage or mass. Vascular: Negative for hyperdense vessel. Atherosclerotic calcification. Skull: Negative Sinuses/Orbits: Paranasal sinuses clear. Bilateral ocular surgery for cataract Other: None ASPECTS ([place] Stroke Program Early CT Score) - Ganglionic level infarction (caudate, lentiform nuclei, internal capsule, insula, M1-M3 cortex): 7 - Supraganglionic infarction (M4-M6 cortex): 3 Total score (0-10 with 10 being normal): 10 IMPRESSION: 1. No acute intracranial abnormality 2. Atrophy and chronic microvascular ischemic change in the white matter 3. ASPECTS is 10 Electronically Signed   By: Franchot Gallo M.D.   On: 04/20/2019 16:23    Procedures Procedures (including critical care time)  Medications Ordered in ED Medications  levETIRAcetam (KEPPRA) 750 mg in sodium chloride 0.9 % 100 mL IVPB (0 mg Intravenous Stopped 04/20/19 1811)     Initial Impression / Assessment and Plan / ED Course  I have reviewed the triage vital signs and the nursing notes.  Pertinent labs & imaging results that were available during my care of the patient were reviewed by me and considered in my medical decision making (see chart for details).    3:55 PM: Patient arrives via EMS for what was resolved slurred speech and expressive aphasia.  On my initial examination patient is confused unable to  speak her name or explain why she was brought into the emergency department.  Patient is stumbling over her words which is reportedly unusual for her.  Review of patient's chart reveals that she was admitted on 03/31/2019 for similar symptoms and and coded as code stroke at that time.  On examination no other neuro deficits aside from expressive aphasia, discussed with Dr. Wilson Singer as patient is within stroke window, Code Stroke called. ------------- I-STAT creatinine within normal limits CMP unremarkable Ethanol level negative APTT within normal limits PT/INR within normal limits CBC unremarkable CBG 95 Urinalysis with small leukocytes, overall noninfectious, patient without urinary symptoms UDS negative EKG without acute findings reviewed with Dr. Wilson Singer CT head:  IMPRESSION:  1. No acute intracranial abnormality  2. Atrophy and chronic microvascular ischemic change in the white  matter  3. ASPECTS is 10  ----------- Patient seen and evaluated by neurology.  Patient symptoms have yet again subsided neurology feels that this may be seizure-like activity, please see their note for full details.  In short neurology will load patient with 750 mg of Keppra here in the department, discharged with 500 mg Keppra twice daily with outpatient neurology follow-up.  On reevaluation patient is fully alert and oriented and conversant.  She states understanding of care plan and is agreeable to discharge at this time.  I had conversation with patient's daughter [name] and discussed the work-up and care plan and they are also agreeable, neurology also discussed case with [facility].  At this time there does not appear to be any evidence of an acute emergency medical condition and the patient appears stable for discharge with appropriate outpatient follow up. Diagnosis was discussed with patient who verbalizes understanding of care plan and is agreeable to discharge. I have discussed return precautions with patient who  verbalizes understanding of return precautions. Patient encouraged to follow-up with  their PCP and neurology. All questions answered.  Patient has been discharged in good condition.  Patient was seen and evaluated with Dr. Wilson Singer during this visit.  Note: Portions of this report may have been transcribed using voice recognition software. Every effort was made to ensure accuracy; however, inadvertent computerized transcription errors may still be present. Final Clinical Impressions(s) / ED Diagnoses   Final diagnoses:  Expressive aphasia    ED Discharge Orders         Ordered    levETIRAcetam (KEPPRA) 500 MG tablet  2 times daily     04/20/19 1719           Gari Crown 04/20/19 1936    Virgel Manifold, MD 04/20/19 [phone]

## 2019-04-25 ENCOUNTER — Encounter: Payer: Self-pay | Admitting: *Deleted

## 2019-04-25 ENCOUNTER — Telehealth: Payer: Self-pay | Admitting: *Deleted

## 2019-04-25 NOTE — Telephone Encounter (Signed)
Called daughter, [name] on [place] and advised her that due to current COVID 19 pandemic, our office is severely reducing in person visits in order to minimize the risk to our patients and healthcare providers. We recommend to convert patient's appointment to a video visit. We'll take all precautions to reduce any security or privacy concerns. This will be treated like an office visit, and we will file with her insurance. The patient lives with daughter, consented to video visit. Daughter's email is [name]. and she will assist with appointment. She understands that the [employer] webex software must be downloaded and operational on the device pt plans to use for the visit. She stated she already has it downloaded, used for other dr visit already. We updated patient's EMR. [name] Barker verbalized understanding, appreciation. Webex scheduled; e mail sent.

## 2019-04-27 ENCOUNTER — Encounter: Payer: Self-pay | Admitting: Diagnostic Neuroimaging

## 2019-04-27 ENCOUNTER — Ambulatory Visit (INDEPENDENT_AMBULATORY_CARE_PROVIDER_SITE_OTHER): Payer: Medicare Other | Admitting: Diagnostic Neuroimaging

## 2019-04-27 ENCOUNTER — Other Ambulatory Visit: Payer: Self-pay

## 2019-04-27 DIAGNOSIS — F03A0 Unspecified dementia, mild, without behavioral disturbance, psychotic disturbance, mood disturbance, and anxiety: Secondary | ICD-10-CM

## 2019-04-27 DIAGNOSIS — I639 Cerebral infarction, unspecified: Secondary | ICD-10-CM

## 2019-04-27 DIAGNOSIS — F0390 Unspecified dementia without behavioral disturbance: Secondary | ICD-10-CM | POA: Diagnosis not present

## 2019-04-27 DIAGNOSIS — G459 Transient cerebral ischemic attack, unspecified: Secondary | ICD-10-CM | POA: Diagnosis not present

## 2019-04-27 DIAGNOSIS — G40109 Localization-related (focal) (partial) symptomatic epilepsy and epileptic syndromes with simple partial seizures, not intractable, without status epilepticus: Secondary | ICD-10-CM | POA: Diagnosis not present

## 2019-04-27 DIAGNOSIS — M1711 Unilateral primary osteoarthritis, right knee: Secondary | ICD-10-CM | POA: Insufficient documentation

## 2019-04-27 DIAGNOSIS — R2990 Unspecified symptoms and signs involving the nervous system: Secondary | ICD-10-CM

## 2019-04-27 NOTE — Progress Notes (Signed)
Virtual Visit via Video Note  I connected with [name] Barker on 04/27/19 at  3:00 PM EDT by a video enabled telemedicine application and verified that I am speaking with the correct person using two identifiers.   I discussed the limitations of evaluation and management by telemedicine and the availability of in person appointments. The patient expressed understanding and agreed to proceed.  Patient is at their home. I am at the office. Patient's daughter also on the call.   History of Present Illness:  85 year old female here for evaluation of abnormal spells.  Patient has a history of dementia and prior word finding difficulty attacks.  Patient had similar attack in 2013 was diagnosed with TIA versus migraine variant versus stress reaction.  In 2018 she saw another neurologist and was diagnosed with mild dementia.  In April 2028 patient had another word finding/aphasia attack, went to the hospital and received IV TPA.  Stroke work-up was completed.  Patient was started on aspirin and Plavix for 3 weeks, but patient only continued Plavix.  Patient returned to hospital on April 22 for similar symptoms, and again MRI of the brain was negative.  Symptoms resolved spontaneously.  Patient was diagnosed with possible partial seizure and started on antiseizure medication.  However patient had significant lethargy and sleepiness on this medication at home and therefore family stopped the medication.  Patient now returning back to baseline.  She has had another few very minor word finding episodes and spells, especially when waking up from sleep.    Observations/Objective:  VIDEO EXAM  GENERAL EXAM/CONSTITUTIONAL:  Vitals: There were no vitals filed for this visit.  There is no height or weight on file to calculate BMI. Wt Readings from Last 3 Encounters:  03/31/19 121 lb 4.1 oz (55 kg)  11/17/18 112 lb (50.8 kg)  09/13/18 120 lb (54.4 kg)     Patient is in no distress; well  developed, nourished and groomed; neck is supple   NEUROLOGIC: MENTAL STATUS:  MMSE - Mini Mental State Exam 10/13/2017 08/04/2017  Orientation to time 2 2  Orientation to Place 4 3  Registration 3 3  Attention/ Calculation 4 5  Recall 1 0  Language- name 2 objects 2 2  Language- repeat 1 1  Language- follow 3 step command 2 3  Language- read & follow direction 1 1  Write a sentence 1 1  Copy design 0 1  Total score 21 22    awake, alert, oriented to preson --> "2002, May, 2"  DECR memory  DECR attention and concentration  DECR FLUENCY  DECR fund of knowledge  CRANIAL NERVE:   2nd, 3rd, 4th, 6th - visual fields full to confrontation, extraocular muscles intact, no nystagmus  5th - facial sensation symmetric  7th - facial strength symmetric  8th - hearing intact  11th - shoulder shrug symmetric  12th - tongue protrusion midline  MOTOR:   NO TREMOR; NO DRIFT IN BUE  COORDINATION:   fine finger movements normal   04/01/19 TTE - No cardiac source of embolism  03/31/19 CTA head / neck - No large or medium vessel occlusion. - Nonstenotic atherosclerotic change at both carotid bifurcations. - 50% stenosis of the right non dominant vertebral artery origin.  04/01/19 MRI brain  1. No acute intracranial abnormality identified. 2. Generalized age-related cerebral atrophy with moderate chronic microvascular ischemic disease.    Assessment and Plan:  85 y.o. female here for evaluation of abnormal spells.  Most likely these  represent some dementia type symptoms, partial seizure or migraine variant.  TIA is possible although felt to be less likely.  In any case patient does not have any motor deficits or convulsions related to these episodes.  We will proceed with conservative management.   STROKE PREVENTION - continue plavix 75mg  daily - continue BP monitoring - intolerant of statins  ABNORMAL SPELLS --> TIA VS SEIZURE VS MIGRAINE VARIANT VS DEMENTIA -due to  side effects from levetiracetam 500mg  twice a day, family would like to hold off on anti-seizure meds for now - if spells increase, then consider levetiracetam 250mg  twice a day  DEMENTIA  - safety / supervision issues reviewed - caregiver resources provided - no driving; no finances   Follow Up Instructions:  - Return for return to PCP, pending if symptoms worsen or fail to improve.     I discussed the assessment and treatment plan with the patient. The patient was provided an opportunity to ask questions and all were answered. The patient agreed with the plan and demonstrated an understanding of the instructions.   The patient was advised to call back or seek an in-person evaluation if the symptoms worsen or if the condition fails to improve as anticipated.  I provided 30 minutes of non-face-to-face time during this encounter.   Penni Bombard, MD 06/30/5008, 3:81 PM Certified in Neurology, Neurophysiology and Neuroimaging  [facility] [address], [place], [place] [address] [phone]

## 2019-04-28 DIAGNOSIS — M1711 Unilateral primary osteoarthritis, right knee: Secondary | ICD-10-CM | POA: Diagnosis not present

## 2019-05-05 ENCOUNTER — Other Ambulatory Visit: Payer: Self-pay

## 2019-05-05 ENCOUNTER — Other Ambulatory Visit: Payer: Self-pay | Admitting: Physician Assistant

## 2019-05-05 DIAGNOSIS — I4891 Unspecified atrial fibrillation: Secondary | ICD-10-CM

## 2019-05-05 DIAGNOSIS — G459 Transient cerebral ischemic attack, unspecified: Secondary | ICD-10-CM

## 2019-05-09 ENCOUNTER — Telehealth: Payer: Self-pay | Admitting: Diagnostic Neuroimaging

## 2019-05-09 NOTE — Telephone Encounter (Signed)
Called daughter , [name] Barker and advised her Dr Leta Baptist did not order the heart monitor, so he wouldn't give results. She stated it was a 30 day which was just mailed in on Sat. I advised her that she should get call or letter of results after it's been read. She  verbalized understanding, appreciation.

## 2019-05-09 NOTE — Telephone Encounter (Signed)
Pt's daughter POA states she would like the RN to call back with the results of the heart monitor that was ordered for the pt. Please advise.

## 2019-05-11 ENCOUNTER — Ambulatory Visit: Payer: Medicare Other | Admitting: Diagnostic Neuroimaging

## 2019-05-14 ENCOUNTER — Other Ambulatory Visit: Payer: Self-pay | Admitting: Internal Medicine

## 2019-06-09 ENCOUNTER — Other Ambulatory Visit: Payer: Self-pay | Admitting: Internal Medicine

## 2019-06-29 ENCOUNTER — Telehealth: Payer: Self-pay | Admitting: Diagnostic Neuroimaging

## 2019-06-29 MED ORDER — CLOPIDOGREL BISULFATE 75 MG PO TABS: 75.0000 mg | ORAL_TABLET | Freq: Every day | ORAL | 1 refills | Status: DC

## 2019-06-29 NOTE — Telephone Encounter (Signed)
Pt is requesting a refill of clopidogrel (PLAVIX) 75 MG tablet , to be sent to [id number] - [place], [place] [address] AT [address]

## 2019-06-29 NOTE — Telephone Encounter (Signed)
Plavix refilled x 2 months with note to pharmacy: future refills with PCP. Patient has been released to PCP, return as needed.

## 2019-07-05 ENCOUNTER — Other Ambulatory Visit: Payer: Self-pay

## 2019-07-05 NOTE — Patient Outreach (Signed)
Telephone outreach to patient to obtain mRs was successfully completed. mRs= 3. 

## 2019-07-19 DIAGNOSIS — M1711 Unilateral primary osteoarthritis, right knee: Secondary | ICD-10-CM | POA: Diagnosis not present

## 2019-07-24 ENCOUNTER — Other Ambulatory Visit: Payer: Self-pay | Admitting: Diagnostic Neuroimaging

## 2019-07-28 ENCOUNTER — Other Ambulatory Visit: Payer: Self-pay

## 2019-08-17 ENCOUNTER — Other Ambulatory Visit: Payer: Self-pay | Admitting: Diagnostic Neuroimaging

## 2019-08-31 ENCOUNTER — Telehealth: Payer: Self-pay | Admitting: Internal Medicine

## 2019-08-31 NOTE — Telephone Encounter (Signed)
Daughter would like to know if Dr. Sharlet Salina would take over Plavix script?   Patient does have doxy appt on 9/4.

## 2019-09-01 MED ORDER — CLOPIDOGREL BISULFATE 75 MG PO TABS: 75.0000 mg | ORAL_TABLET | Freq: Every day | ORAL | 1 refills | Status: DC

## 2019-09-01 NOTE — Telephone Encounter (Signed)
Fine to send in refill of plavix.

## 2019-09-01 NOTE — Telephone Encounter (Signed)
rx sent

## 2019-09-02 ENCOUNTER — Encounter: Payer: Self-pay | Admitting: Internal Medicine

## 2019-09-02 ENCOUNTER — Ambulatory Visit (INDEPENDENT_AMBULATORY_CARE_PROVIDER_SITE_OTHER): Payer: Medicare Other | Admitting: Internal Medicine

## 2019-09-02 DIAGNOSIS — R05 Cough: Secondary | ICD-10-CM | POA: Diagnosis not present

## 2019-09-02 DIAGNOSIS — R059 Cough, unspecified: Secondary | ICD-10-CM

## 2019-09-02 MED ORDER — BENZONATATE 200 MG PO CAPS: 200.0000 mg | ORAL_CAPSULE | Freq: Three times a day (TID) | ORAL | 6 refills | Status: DC | PRN

## 2019-09-02 MED ORDER — FAMOTIDINE 20 MG PO TABS: 20.0000 mg | ORAL_TABLET | Freq: Every day | ORAL | 3 refills | Status: DC

## 2019-09-02 NOTE — Progress Notes (Signed)
Virtual Visit via Audio Note  I connected with [name] Barker on 09/02/19 at  8:40 AM EDT by an audio-only enabled telemedicine application and verified that I am speaking with the correct person using two identifiers.  The patient and the provider were at separate locations throughout the entire encounter.   I discussed the limitations of evaluation and management by telemedicine and the availability of in person appointments. The patient expressed understanding and agreed to proceed.  History of Present Illness: The patient is a 85 y.o. female with visit for cough when lying down at night time. Started about 1-2 months ago. Has no SOB with waking with cough and no change in weight. They do weight her often. She denies edema in legs. Cough is non-productive. Still taking flonase and claritin with good relief of nasal drainage symptoms. Denies fevers or chills. Does not leave home so no known exposure to covid-19. Overall it is stable. Has tried nothing  Observations/Objective: Voice strong, hearing poor and hearing aids are broken currently, alert and oriented   Assessment and Plan: See problem oriented charting  Follow Up Instructions: potentially gerd and rx pepcid and tessalon perles  Visit time 15 minutes: that time was spent in non-face to face counseling and coordination of care with the patient: counseled about as above  I discussed the assessment and treatment plan with the patient. The patient was provided an opportunity to ask questions and all were answered. The patient agreed with the plan and demonstrated an understanding of the instructions.   The patient was advised to call back or seek an in-person evaluation if the symptoms worsen or if the condition fails to improve as anticipated.  Hoyt Koch, MD

## 2019-09-02 NOTE — Assessment & Plan Note (Signed)
Does not sound to be CHF related. Could be GERD or post nasal drip. Rx pepcid to try as well as tessalon perles. Given age they do not want further cardiac evaluation at this time which is appropriate for her goals of care.

## 2019-09-07 ENCOUNTER — Other Ambulatory Visit: Payer: Self-pay | Admitting: Internal Medicine

## 2019-09-07 DIAGNOSIS — R059 Cough, unspecified: Secondary | ICD-10-CM

## 2019-09-07 DIAGNOSIS — R05 Cough: Secondary | ICD-10-CM

## 2019-09-08 DIAGNOSIS — H6123 Impacted cerumen, bilateral: Secondary | ICD-10-CM | POA: Diagnosis not present

## 2019-09-08 DIAGNOSIS — Z974 Presence of external hearing-aid: Secondary | ICD-10-CM | POA: Diagnosis not present

## 2019-09-08 DIAGNOSIS — H903 Sensorineural hearing loss, bilateral: Secondary | ICD-10-CM | POA: Diagnosis not present

## 2019-09-23 ENCOUNTER — Other Ambulatory Visit: Payer: Self-pay | Admitting: Internal Medicine

## 2019-09-24 ENCOUNTER — Other Ambulatory Visit: Payer: Self-pay | Admitting: Internal Medicine

## 2019-09-26 DIAGNOSIS — Z23 Encounter for immunization: Secondary | ICD-10-CM | POA: Diagnosis not present

## 2019-10-21 ENCOUNTER — Other Ambulatory Visit: Payer: Self-pay | Admitting: Internal Medicine

## 2019-10-26 ENCOUNTER — Other Ambulatory Visit: Payer: Self-pay | Admitting: Internal Medicine

## 2019-10-31 DIAGNOSIS — M1711 Unilateral primary osteoarthritis, right knee: Secondary | ICD-10-CM | POA: Diagnosis not present

## 2019-11-17 ENCOUNTER — Other Ambulatory Visit: Payer: Self-pay | Admitting: Internal Medicine

## 2019-12-11 ENCOUNTER — Encounter: Payer: Self-pay | Admitting: Internal Medicine

## 2019-12-17 ENCOUNTER — Telehealth: Payer: Self-pay | Admitting: Family Medicine

## 2019-12-17 ENCOUNTER — Encounter: Payer: Self-pay | Admitting: Family Medicine

## 2019-12-17 DIAGNOSIS — F03A0 Unspecified dementia, mild, without behavioral disturbance, psychotic disturbance, mood disturbance, and anxiety: Secondary | ICD-10-CM

## 2019-12-17 DIAGNOSIS — F0390 Unspecified dementia without behavioral disturbance: Secondary | ICD-10-CM

## 2019-12-17 DIAGNOSIS — R2990 Unspecified symptoms and signs involving the nervous system: Secondary | ICD-10-CM

## 2019-12-17 MED ORDER — HYDROXYZINE HCL 10 MG PO TABS: 10.0000 mg | ORAL_TABLET | Freq: Two times a day (BID) | ORAL | 0 refills | Status: DC | PRN

## 2019-12-17 NOTE — Telephone Encounter (Signed)
Received call from [facility] daughter about acutely worsening agitation and sundowning in this patient with vascular dementia with recent strokes this year - pt has not been sleeping well at all and worried lack of sleep exacerbating issue. They do not wish for aggressive eval or ER eval at this time, want to keep patient at home.  They have already given patient benadryl 25mg  and tylenol with some benefit and she seems to be resting better now. Agree with tylenol dosing PRN possible pain contribution.  Discussed aromatherapy, music therapy to help rest at night.  Offered low dose hydroxyzine 10-20mg  PRN. No h/o constipation or urinary retention.  They ask about hospice involvement at home. Will forward to PCP.

## 2019-12-19 ENCOUNTER — Other Ambulatory Visit: Payer: Self-pay | Admitting: Internal Medicine

## 2019-12-19 DIAGNOSIS — R059 Cough, unspecified: Secondary | ICD-10-CM

## 2019-12-19 DIAGNOSIS — R05 Cough: Secondary | ICD-10-CM

## 2019-12-19 NOTE — Addendum Note (Signed)
Addended by: Pricilla Holm A on: 12/19/2019 10:53 AM   Modules accepted: Orders

## 2019-12-19 NOTE — Telephone Encounter (Signed)
Hospice referral placed, okay with being attending.

## 2019-12-19 NOTE — Telephone Encounter (Signed)
Hospice has been informed.

## 2019-12-21 ENCOUNTER — Encounter: Payer: Self-pay | Admitting: Internal Medicine

## 2019-12-21 DIAGNOSIS — R2990 Unspecified symptoms and signs involving the nervous system: Secondary | ICD-10-CM

## 2019-12-21 DIAGNOSIS — F0390 Unspecified dementia without behavioral disturbance: Secondary | ICD-10-CM

## 2019-12-21 DIAGNOSIS — F03A0 Unspecified dementia, mild, without behavioral disturbance, psychotic disturbance, mood disturbance, and anxiety: Secondary | ICD-10-CM

## 2019-12-21 MED ORDER — QUETIAPINE FUMARATE 25 MG PO TABS: 25.0000 mg | ORAL_TABLET | Freq: Every day | ORAL | 3 refills | Status: DC

## 2019-12-21 NOTE — Telephone Encounter (Signed)
Pt's daughter has been informed that Hospice went through a name change and are now called [facility].

## 2020-01-07 ENCOUNTER — Other Ambulatory Visit: Payer: Self-pay | Admitting: Internal Medicine

## 2020-01-28 ENCOUNTER — Other Ambulatory Visit: Payer: Self-pay | Admitting: Internal Medicine

## 2020-01-29 ENCOUNTER — Other Ambulatory Visit: Payer: Self-pay | Admitting: Internal Medicine

## 2020-02-08 ENCOUNTER — Encounter: Payer: Self-pay | Admitting: Internal Medicine

## 2020-02-08 MED ORDER — CITALOPRAM HYDROBROMIDE 10 MG PO TABS: 10.0000 mg | ORAL_TABLET | Freq: Every day | ORAL | 3 refills | Status: DC

## 2020-02-26 ENCOUNTER — Ambulatory Visit: Payer: Medicare Other | Attending: Internal Medicine

## 2020-02-26 DIAGNOSIS — Z23 Encounter for immunization: Secondary | ICD-10-CM | POA: Insufficient documentation

## 2020-02-26 NOTE — Progress Notes (Signed)
   Covid-19 Vaccination Clinic  Name:  [name] Barker    MRN: [id number] DOB: 08-14-1921  02/26/2020  [name] was observed post Covid-19 immunization for 15 minutes without incidence. She was provided with Vaccine Information Sheet and instruction to access the V-Safe system.   [name] was instructed to call 911 with any severe reactions post vaccine: Marland Kitchen Difficulty breathing  . Swelling of your face and throat  . A fast heartbeat  . A bad rash all over your body  . Dizziness and weakness    Immunizations Administered    Name Date Dose VIS Date Route   Pfizer COVID-19 Vaccine 02/26/2020 11:01 AM 0.3 mL 12/09/2019 Intramuscular   Manufacturer: [place]   Lot: [id number]   [place]: [id number]

## 2020-03-21 ENCOUNTER — Ambulatory Visit: Payer: Medicare Other | Attending: Internal Medicine

## 2020-03-21 DIAGNOSIS — Z23 Encounter for immunization: Secondary | ICD-10-CM

## 2020-03-21 NOTE — Progress Notes (Signed)
   Covid-19 Vaccination Clinic  Name:  [name] Barker    MRN: [id number] DOB: 1921-10-22  03/21/2020  [name] Barker was observed post Covid-19 immunization for 15 minutes without incident. She was provided with Vaccine Information Sheet and instruction to access the V-Safe system.   [name] Barker was instructed to call 911 with any severe reactions post vaccine: Marland Kitchen Difficulty breathing  . Swelling of face and throat  . A fast heartbeat  . A bad rash all over body  . Dizziness and weakness   Immunizations Administered    Name Date Dose VIS Date Route   Pfizer COVID-19 Vaccine 03/21/2020  3:50 PM 0.3 mL 12/09/2019 Intramuscular   Manufacturer: [place]   Lot: [id number]   [place]: [id number]

## 2020-03-24 ENCOUNTER — Other Ambulatory Visit: Payer: Self-pay | Admitting: Internal Medicine

## 2020-05-02 ENCOUNTER — Other Ambulatory Visit: Payer: Self-pay | Admitting: Internal Medicine

## 2020-05-02 DIAGNOSIS — R05 Cough: Secondary | ICD-10-CM

## 2020-05-02 DIAGNOSIS — R059 Cough, unspecified: Secondary | ICD-10-CM

## 2020-05-27 ENCOUNTER — Other Ambulatory Visit: Payer: Self-pay | Admitting: Internal Medicine

## 2020-05-27 DIAGNOSIS — R059 Cough, unspecified: Secondary | ICD-10-CM

## 2020-06-01 ENCOUNTER — Other Ambulatory Visit: Payer: Self-pay | Admitting: Internal Medicine

## 2020-06-28 ENCOUNTER — Other Ambulatory Visit: Payer: Self-pay | Admitting: Internal Medicine

## 2020-07-20 ENCOUNTER — Other Ambulatory Visit: Payer: Self-pay | Admitting: Internal Medicine

## 2020-07-25 ENCOUNTER — Telehealth (INDEPENDENT_AMBULATORY_CARE_PROVIDER_SITE_OTHER): Payer: Medicare Other | Admitting: Internal Medicine

## 2020-07-25 DIAGNOSIS — F325 Major depressive disorder, single episode, in full remission: Secondary | ICD-10-CM | POA: Diagnosis not present

## 2020-07-25 DIAGNOSIS — Z0000 Encounter for general adult medical examination without abnormal findings: Secondary | ICD-10-CM | POA: Diagnosis not present

## 2020-07-25 DIAGNOSIS — H6123 Impacted cerumen, bilateral: Secondary | ICD-10-CM | POA: Diagnosis not present

## 2020-07-25 DIAGNOSIS — M545 Low back pain, unspecified: Secondary | ICD-10-CM

## 2020-07-25 DIAGNOSIS — Z8673 Personal history of transient ischemic attack (TIA), and cerebral infarction without residual deficits: Secondary | ICD-10-CM

## 2020-07-25 DIAGNOSIS — G8929 Other chronic pain: Secondary | ICD-10-CM

## 2020-07-25 DIAGNOSIS — F0390 Unspecified dementia without behavioral disturbance: Secondary | ICD-10-CM

## 2020-07-25 DIAGNOSIS — K219 Gastro-esophageal reflux disease without esophagitis: Secondary | ICD-10-CM | POA: Diagnosis not present

## 2020-07-25 DIAGNOSIS — F03A0 Unspecified dementia, mild, without behavioral disturbance, psychotic disturbance, mood disturbance, and anxiety: Secondary | ICD-10-CM

## 2020-07-25 MED ORDER — CLOPIDOGREL BISULFATE 75 MG PO TABS: ORAL_TABLET | ORAL | 3 refills | Status: DC

## 2020-07-25 MED ORDER — FAMOTIDINE 20 MG PO TABS: 20.0000 mg | ORAL_TABLET | Freq: Every day | ORAL | 3 refills | Status: DC

## 2020-07-25 MED ORDER — CITALOPRAM HYDROBROMIDE 10 MG PO TABS: 10.0000 mg | ORAL_TABLET | Freq: Every day | ORAL | 3 refills | Status: DC

## 2020-07-25 NOTE — Progress Notes (Signed)
Virtual Visit via Video Note  I connected with [name] Barker on 07/27/20 at 11:00 AM EDT by a video enabled telemedicine application and verified that I am speaking with the correct person using two identifiers.  The patient and the provider were at separate locations throughout the entire encounter. Patient location: home, Provider location: work   I discussed the limitations of evaluation and management by telemedicine and the availability of in person appointments. The patient expressed understanding and agreed to proceed. The patient and the provider were the only parties present for the visit unless noted in HPI below.  History of Present Illness:  Here for medicare wellness, no new complaints. Please see A/P for status and treatment of chronic medical problems.   HPI #2: Follow up dementia (sometimes cannot recognize family members, mostly can redirect if she gets sad or emotional, no behavior issues, taking celexa which has helped), and GERD (taking pepcid daily has helped night time cough and this is gone, she denies symptoms during the day, overall good control), and past stroke (taking plavix daily, no new symptoms of stroke, BP at goal, not diabetic). Her daughter helps provide most history.   Diet: heart healthy Physical activity: sedentary Depression/mood screen: negative Hearing: intact to whispered voice, moderate loss bilaterally Visual acuity: grossly normal, performs annual eye exam  ADLs: needs assist, can transfer independently Fall risk: medium Home safety: good Cognitive evaluation: poor, has caretaker all the time EOL planning: adv directives discussed, in place    Video Visit from 07/25/2020 in [place] at [facility] Total Score 0        Clinical Support from 08/04/2017 in [place]  PHQ-9 Total Score 2     I have personally reviewed and have noted 1. The patient's medical and social history - reviewed today no  changes 2. Their use of alcohol, tobacco or illicit drugs 3. Their current medications and supplements 4. The patient's functional ability including ADL's, fall risks, home safety risks and hearing or visual impairment. 5. Diet and physical activities 6. Evidence for depression or mood disorders 7. Care team reviewed and updated  Patient Care Team: Hoyt Koch, MD as PCP - General (Internal Medicine) Ladell Pier, MD as Consulting Physician (Oncology) Ladene Artist, MD as Consulting Physician (Gastroenterology) Past Medical History:  Diagnosis Date  . Allergic rhinitis   . ANEMIA-NOS 11/17/2006  . Colon cancer ([place]) 08/19/2007   1994 T2, 2008 T3, N1  . DIVERTICULOSIS-COLON 05/30/2010  . Femur fracture, left ([place]) 09/13/2018   CLOSED  . GERD 11/17/2006  . Hemorrhoids   . HYPERLIPIDEMIA 11/17/2006  . LOW BACK PAIN, CHRONIC 02/04/2010  . Osteopenia 11/17/2006  . Small bowel obstruction ([place])   . TIA (transient ischemic attack)    Past Surgical History:  Procedure Laterality Date  . ABDOMINAL HYSTERECTOMY    . APPENDECTOMY    . BACK SURGERY  2007  . BELPHAROPTOSIS REPAIR    . CATARACT EXTRACTION    . HEMICOLECTOMY  1994   Right  . JOINT REPLACEMENT    . KNEE ARTHROSCOPY    . ORIF FEMUR FRACTURE Left 09/13/2018   Procedure: OPEN REDUCTION INTERNAL FIXATION (ORIF) DISTAL FEMUR FRACTURE;  Surgeon: Shona Needles, MD;  Location: [place];  Service: Orthopedics;  Laterality: Left;  . PARTIAL COLECTOMY  2008  . ROTATOR CUFF REPAIR    . SHOULDER SURGERY    . VAGINAL PROLAPSE REPAIR     Family History  Problem Relation Age of Onset  . Lung cancer Sister   . Colon cancer Sister 68  . Kidney cancer Sister    Observations/Objective: Appearance: normal, breathing appears normal, no coughing or dyspnea during visit, casual grooming, abdomen does not appear distended, throat normal, memory poor, mental status is awake and alert to self and surroundings not date  117 lb,  58.5 '', 103/64  Assessment and Plan: See problem oriented charting  Follow Up Instructions: refill medications, will skip labs and can continue with virtual visits only for now  I discussed the assessment and treatment plan with the patient. The patient was provided an opportunity to ask questions and all were answered. The patient agreed with the plan and demonstrated an understanding of the instructions.   The patient was advised to call back or seek an in-person evaluation if the symptoms worsen or if the condition fails to improve as anticipated.  Hoyt Koch, MD

## 2020-07-27 ENCOUNTER — Encounter: Payer: Self-pay | Admitting: Internal Medicine

## 2020-07-27 NOTE — Assessment & Plan Note (Signed)
BP at goal off meds, no DM. At 89 they wish to discontinue further medical testing if not necessary. Taking plavix daily for stroke prevention which is appropriate. No new stroke symptoms.

## 2020-07-27 NOTE — Assessment & Plan Note (Signed)
Taking tylenol for pain and advised she can take aleve as needed given normal renal function previously.

## 2020-07-27 NOTE — Assessment & Plan Note (Signed)
Flu shot yearly. Covid-19 up to date. Pneumonia complete. Shingrix counseled declined. Tetanus decline further. Colonoscopy aged out. Mammogram aged out, pap smear aged out and dexa declines further. Counseled about sun safety and mole surveillance. Counseled about the dangers of distracted driving. Given 10 year screening recommendations.

## 2020-07-27 NOTE — Assessment & Plan Note (Signed)
Stable symptoms on celexa 10 mg daily and no side effects. Continue lifelong.

## 2020-07-27 NOTE — Assessment & Plan Note (Signed)
Symptoms are worsening with time and likely due to past strokes. They do not wish to pursue medications for this or aggressive treatment. Keep celexa for mood which is helping well.

## 2020-07-27 NOTE — Assessment & Plan Note (Signed)
Well controlled on pepcid at night time with resolution of night time cough.

## 2020-09-25 DIAGNOSIS — Z23 Encounter for immunization: Secondary | ICD-10-CM | POA: Diagnosis not present

## 2020-12-11 ENCOUNTER — Telehealth: Payer: Self-pay | Admitting: Internal Medicine

## 2020-12-11 NOTE — Progress Notes (Signed)
  Chronic Care Management   Note  12/11/2020 Name: [name] Barker MRN: [id number] DOB: [birth year]  [name] is a 86 y.o. year old female who is a primary care patient of Hoyt Koch, MD. I reached out to Shella Spearing by phone today in response to a referral sent by [name]'s PCP, Hoyt Koch, MD.   [name] was given information about Chronic Care Management services today including:  1. CCM service includes personalized support from designated clinical staff supervised by her physician, including individualized plan of care and coordination with other care providers 2. 24/7 contact phone numbers for assistance for urgent and routine care needs. 3. Service will only be billed when office clinical staff spend 20 minutes or more in a month to coordinate care. 4. Only one practitioner may furnish and bill the service in a calendar month. 5. The patient may stop CCM services at any time (effective at the end of the month) by phone call to the office staff.   Patient agreed to services and verbal consent obtained.   Follow up plan:   Carley Perdue [employer] Scheduler

## 2021-01-21 DIAGNOSIS — S8002XA Contusion of left knee, initial encounter: Secondary | ICD-10-CM | POA: Diagnosis not present

## 2021-01-21 DIAGNOSIS — M1711 Unilateral primary osteoarthritis, right knee: Secondary | ICD-10-CM | POA: Diagnosis not present

## 2021-01-26 ENCOUNTER — Emergency Department (HOSPITAL_COMMUNITY): Payer: Medicare Other

## 2021-01-26 ENCOUNTER — Encounter (HOSPITAL_COMMUNITY): Payer: Self-pay | Admitting: Internal Medicine

## 2021-01-26 ENCOUNTER — Encounter: Payer: Self-pay | Admitting: Internal Medicine

## 2021-01-26 ENCOUNTER — Inpatient Hospital Stay (HOSPITAL_COMMUNITY)
Admission: EM | Admit: 2021-01-26 | Discharge: 2021-01-30 | DRG: 480 | Disposition: A | Discharge status: Home Health | Payer: Medicare Other | Attending: Internal Medicine | Admitting: Internal Medicine

## 2021-01-26 DIAGNOSIS — Y92012 Bathroom of single-family (private) house as the place of occurrence of the external cause: Secondary | ICD-10-CM

## 2021-01-26 DIAGNOSIS — S72001A Fracture of unspecified part of neck of right femur, initial encounter for closed fracture: Secondary | ICD-10-CM

## 2021-01-26 DIAGNOSIS — S72141D Displaced intertrochanteric fracture of right femur, subsequent encounter for closed fracture with routine healing: Secondary | ICD-10-CM | POA: Diagnosis not present

## 2021-01-26 DIAGNOSIS — F32A Depression, unspecified: Secondary | ICD-10-CM | POA: Diagnosis present

## 2021-01-26 DIAGNOSIS — M549 Dorsalgia, unspecified: Secondary | ICD-10-CM | POA: Diagnosis not present

## 2021-01-26 DIAGNOSIS — R404 Transient alteration of awareness: Secondary | ICD-10-CM | POA: Diagnosis not present

## 2021-01-26 DIAGNOSIS — D62 Acute posthemorrhagic anemia: Secondary | ICD-10-CM | POA: Diagnosis not present

## 2021-01-26 DIAGNOSIS — I82441 Acute embolism and thrombosis of right tibial vein: Secondary | ICD-10-CM | POA: Diagnosis present

## 2021-01-26 DIAGNOSIS — G319 Degenerative disease of nervous system, unspecified: Secondary | ICD-10-CM | POA: Diagnosis not present

## 2021-01-26 DIAGNOSIS — Z8673 Personal history of transient ischemic attack (TIA), and cerebral infarction without residual deficits: Secondary | ICD-10-CM

## 2021-01-26 DIAGNOSIS — R7989 Other specified abnormal findings of blood chemistry: Secondary | ICD-10-CM | POA: Diagnosis not present

## 2021-01-26 DIAGNOSIS — H9190 Unspecified hearing loss, unspecified ear: Secondary | ICD-10-CM | POA: Diagnosis present

## 2021-01-26 DIAGNOSIS — F0150 Vascular dementia without behavioral disturbance: Secondary | ICD-10-CM | POA: Diagnosis present

## 2021-01-26 DIAGNOSIS — I959 Hypotension, unspecified: Secondary | ICD-10-CM | POA: Diagnosis not present

## 2021-01-26 DIAGNOSIS — S72141A Displaced intertrochanteric fracture of right femur, initial encounter for closed fracture: Principal | ICD-10-CM | POA: Diagnosis present

## 2021-01-26 DIAGNOSIS — U071 COVID-19: Secondary | ICD-10-CM | POA: Diagnosis not present

## 2021-01-26 DIAGNOSIS — I6381 Other cerebral infarction due to occlusion or stenosis of small artery: Secondary | ICD-10-CM | POA: Diagnosis not present

## 2021-01-26 DIAGNOSIS — Z85038 Personal history of other malignant neoplasm of large intestine: Secondary | ICD-10-CM

## 2021-01-26 DIAGNOSIS — I10 Essential (primary) hypertension: Secondary | ICD-10-CM | POA: Diagnosis not present

## 2021-01-26 DIAGNOSIS — W19XXXA Unspecified fall, initial encounter: Secondary | ICD-10-CM

## 2021-01-26 DIAGNOSIS — R9082 White matter disease, unspecified: Secondary | ICD-10-CM | POA: Diagnosis not present

## 2021-01-26 DIAGNOSIS — M545 Low back pain, unspecified: Secondary | ICD-10-CM | POA: Diagnosis not present

## 2021-01-26 DIAGNOSIS — F0151 Vascular dementia with behavioral disturbance: Secondary | ICD-10-CM | POA: Diagnosis not present

## 2021-01-26 DIAGNOSIS — W1830XA Fall on same level, unspecified, initial encounter: Secondary | ICD-10-CM | POA: Diagnosis present

## 2021-01-26 DIAGNOSIS — E876 Hypokalemia: Secondary | ICD-10-CM | POA: Diagnosis present

## 2021-01-26 DIAGNOSIS — Z66 Do not resuscitate: Secondary | ICD-10-CM | POA: Diagnosis not present

## 2021-01-26 DIAGNOSIS — E785 Hyperlipidemia, unspecified: Secondary | ICD-10-CM | POA: Diagnosis present

## 2021-01-26 DIAGNOSIS — Z7401 Bed confinement status: Secondary | ICD-10-CM | POA: Diagnosis not present

## 2021-01-26 DIAGNOSIS — Z888 Allergy status to other drugs, medicaments and biological substances status: Secondary | ICD-10-CM

## 2021-01-26 DIAGNOSIS — S72101A Unspecified trochanteric fracture of right femur, initial encounter for closed fracture: Secondary | ICD-10-CM | POA: Diagnosis not present

## 2021-01-26 DIAGNOSIS — D649 Anemia, unspecified: Secondary | ICD-10-CM | POA: Diagnosis not present

## 2021-01-26 DIAGNOSIS — Z419 Encounter for procedure for purposes other than remedying health state, unspecified: Secondary | ICD-10-CM

## 2021-01-26 DIAGNOSIS — M25551 Pain in right hip: Secondary | ICD-10-CM | POA: Diagnosis not present

## 2021-01-26 DIAGNOSIS — I517 Cardiomegaly: Secondary | ICD-10-CM | POA: Diagnosis not present

## 2021-01-26 DIAGNOSIS — R9431 Abnormal electrocardiogram [ECG] [EKG]: Secondary | ICD-10-CM | POA: Diagnosis not present

## 2021-01-26 DIAGNOSIS — Z885 Allergy status to narcotic agent status: Secondary | ICD-10-CM | POA: Diagnosis not present

## 2021-01-26 DIAGNOSIS — R0902 Hypoxemia: Secondary | ICD-10-CM | POA: Diagnosis not present

## 2021-01-26 DIAGNOSIS — S0990XA Unspecified injury of head, initial encounter: Secondary | ICD-10-CM | POA: Diagnosis not present

## 2021-01-26 DIAGNOSIS — M2550 Pain in unspecified joint: Secondary | ICD-10-CM | POA: Diagnosis not present

## 2021-01-26 DIAGNOSIS — R296 Repeated falls: Secondary | ICD-10-CM | POA: Diagnosis present

## 2021-01-26 DIAGNOSIS — S72001D Fracture of unspecified part of neck of right femur, subsequent encounter for closed fracture with routine healing: Secondary | ICD-10-CM | POA: Diagnosis present

## 2021-01-26 HISTORY — DX: COVID-19: U07.1

## 2021-01-26 HISTORY — DX: Personal history of transient ischemic attack (TIA), and cerebral infarction without residual deficits: Z86.73

## 2021-01-26 HISTORY — DX: Do not resuscitate: Z66

## 2021-01-26 HISTORY — DX: Hyperlipidemia, unspecified: E78.5

## 2021-01-26 HISTORY — DX: Malignant neoplasm of colon, unspecified: C18.9

## 2021-01-26 LAB — CBC WITH DIFFERENTIAL/PLATELET
Abs Immature Granulocytes: 0.04 10*3/uL (ref 0.00–0.07)
Basophils Absolute: 0 10*3/uL (ref 0.0–0.1)
Basophils Relative: 1 %
Eosinophils Absolute: 0 10*3/uL (ref 0.0–0.5)
Eosinophils Relative: 0 %
HCT: 31.1 % — ABNORMAL LOW (ref 36.0–46.0)
Hemoglobin: 9.6 g/dL — ABNORMAL LOW (ref 12.0–15.0)
Immature Granulocytes: 1 %
Lymphocytes Relative: 22 %
Lymphs Abs: 1.4 10*3/uL (ref 0.7–4.0)
MCH: 29.6 pg (ref 26.0–34.0)
MCHC: 30.9 g/dL (ref 30.0–36.0)
MCV: 96 fL (ref 80.0–100.0)
Monocytes Absolute: 0.5 10*3/uL (ref 0.1–1.0)
Monocytes Relative: 8 %
Neutro Abs: 4.3 10*3/uL (ref 1.7–7.7)
Neutrophils Relative %: 68 %
Platelets: 242 10*3/uL (ref 150–400)
RBC: 3.24 MIL/uL — ABNORMAL LOW (ref 3.87–5.11)
RDW: 13.4 % (ref 11.5–15.5)
WBC: 6.3 10*3/uL (ref 4.0–10.5)
nRBC: 0 % (ref 0.0–0.2)

## 2021-01-26 LAB — BASIC METABOLIC PANEL
Anion gap: 11 (ref 5–15)
BUN: 13 mg/dL (ref 8–23)
CO2: 27 mmol/L (ref 22–32)
Calcium: 8.6 mg/dL — ABNORMAL LOW (ref 8.9–10.3)
Chloride: 99 mmol/L (ref 98–111)
Creatinine, Ser: 0.54 mg/dL (ref 0.44–1.00)
GFR, Estimated: >60 mL/min (ref >60)
Glucose, Bld: 129 mg/dL — ABNORMAL HIGH (ref 70–99)
Potassium: 4.2 mmol/L (ref 3.5–5.1)
Sodium: 137 mmol/L (ref 135–145)

## 2021-01-26 LAB — TYPE AND SCREEN
ABO/RH(D): O POS
Antibody Screen: NEGATIVE

## 2021-01-26 LAB — D-DIMER, QUANTITATIVE: D-Dimer, Quant: >20 ug/mL-FEU — ABNORMAL HIGH (ref 0.00–0.50)

## 2021-01-26 LAB — FIBRINOGEN: Fibrinogen: 370 mg/dL (ref 210–475)

## 2021-01-26 LAB — PROTIME-INR
INR: 1.1 (ref 0.8–1.2)
Prothrombin Time: 13.4 seconds (ref 11.4–15.2)

## 2021-01-26 LAB — SARS CORONAVIRUS 2 BY RT PCR (HOSPITAL ORDER, PERFORMED IN ~~LOC~~ HOSPITAL LAB): SARS Coronavirus 2: POSITIVE — AB

## 2021-01-26 LAB — ABO/RH: ABO/RH(D): O POS

## 2021-01-26 LAB — LACTATE DEHYDROGENASE: LDH: 186 U/L (ref 98–192)

## 2021-01-26 LAB — PROCALCITONIN: Procalcitonin: <0.1 ng/mL

## 2021-01-26 LAB — C-REACTIVE PROTEIN: CRP: 2.2 mg/dL — ABNORMAL HIGH (ref <1.0)

## 2021-01-26 LAB — FERRITIN: Ferritin: 80 ng/mL (ref 11–307)

## 2021-01-26 MED ORDER — FAMOTIDINE 20 MG PO TABS
20.0000 mg | ORAL_TABLET | Freq: Every day | ORAL | Status: DC
  Administered 2021-01-26: 20 mg via ORAL
  Filled 2021-01-26: qty 1

## 2021-01-26 MED ORDER — CITALOPRAM HYDROBROMIDE 20 MG PO TABS
10.0000 mg | ORAL_TABLET | Freq: Every day | ORAL | Status: DC
  Administered 2021-01-27 – 2021-01-30 (×4): 10 mg via ORAL
  Filled 2021-01-26 (×4): qty 1

## 2021-01-26 MED ORDER — HYDROCOD POLST-CPM POLST ER 10-8 MG/5ML PO SUER: 5.0000 mL | Freq: Two times a day (BID) | ORAL | Status: DC | PRN

## 2021-01-26 MED ORDER — METHOCARBAMOL 500 MG PO TABS: 500.0000 mg | ORAL_TABLET | Freq: Four times a day (QID) | ORAL | Status: DC | PRN

## 2021-01-26 MED ORDER — ZINC SULFATE 220 (50 ZN) MG PO CAPS
220.0000 mg | ORAL_CAPSULE | Freq: Every day | ORAL | Status: DC
  Administered 2021-01-26 – 2021-01-30 (×5): 220 mg via ORAL
  Filled 2021-01-26 (×5): qty 1

## 2021-01-26 MED ORDER — ALBUTEROL SULFATE HFA 108 (90 BASE) MCG/ACT IN AERS
2.0000 | INHALATION_SPRAY | Freq: Four times a day (QID) | RESPIRATORY_TRACT | Status: DC
  Administered 2021-01-27 – 2021-01-28 (×4): 2 via RESPIRATORY_TRACT
  Filled 2021-01-26 (×2): qty 6.7

## 2021-01-26 MED ORDER — METHOCARBAMOL 1000 MG/10ML IJ SOLN
500.0000 mg | Freq: Four times a day (QID) | INTRAVENOUS | Status: DC | PRN
  Filled 2021-01-26: qty 5

## 2021-01-26 MED ORDER — POLYETHYLENE GLYCOL 3350 17 G PO PACK: 17.0000 g | PACK | Freq: Every day | ORAL | Status: DC | PRN

## 2021-01-26 MED ORDER — HYDROCODONE-ACETAMINOPHEN 5-325 MG PO TABS: 1.0000 | ORAL_TABLET | Freq: Four times a day (QID) | ORAL | Status: DC | PRN

## 2021-01-26 MED ORDER — ACETAMINOPHEN 325 MG PO TABS
650.0000 mg | ORAL_TABLET | Freq: Four times a day (QID) | ORAL | Status: DC | PRN
  Administered 2021-01-26: 650 mg via ORAL
  Filled 2021-01-26: qty 2

## 2021-01-26 MED ORDER — GUAIFENESIN-DM 100-10 MG/5ML PO SYRP: 10.0000 mL | ORAL_SOLUTION | ORAL | Status: DC | PRN

## 2021-01-26 MED ORDER — ASCORBIC ACID 500 MG PO TABS
500.0000 mg | ORAL_TABLET | Freq: Every day | ORAL | Status: DC
  Administered 2021-01-26 – 2021-01-30 (×5): 500 mg via ORAL
  Filled 2021-01-26 (×5): qty 1

## 2021-01-26 MED ORDER — SODIUM CHLORIDE 0.9 % IV SOLN
200.0000 mg | Freq: Once | INTRAVENOUS | Status: AC
  Administered 2021-01-26: 200 mg via INTRAVENOUS
  Filled 2021-01-26: qty 200

## 2021-01-26 MED ORDER — DOCUSATE SODIUM 100 MG PO CAPS
100.0000 mg | ORAL_CAPSULE | Freq: Two times a day (BID) | ORAL | Status: DC
  Administered 2021-01-26 – 2021-01-30 (×7): 100 mg via ORAL
  Filled 2021-01-26 (×7): qty 1

## 2021-01-26 MED ORDER — SODIUM CHLORIDE 0.9 % IV SOLN
100.0000 mg | Freq: Every day | INTRAVENOUS | Status: AC
  Administered 2021-01-27 – 2021-01-28 (×2): 100 mg via INTRAVENOUS
  Filled 2021-01-26 (×2): qty 20

## 2021-01-26 MED ORDER — LORATADINE 10 MG PO TABS
10.0000 mg | ORAL_TABLET | Freq: Every day | ORAL | Status: DC
  Administered 2021-01-27 – 2021-01-30 (×4): 10 mg via ORAL
  Filled 2021-01-26 (×4): qty 1

## 2021-01-26 MED ORDER — BISACODYL 5 MG PO TBEC
5.0000 mg | DELAYED_RELEASE_TABLET | Freq: Every day | ORAL | Status: DC | PRN
Start: 2021-01-26 — End: 2021-01-30

## 2021-01-26 MED ORDER — MORPHINE SULFATE (PF) 2 MG/ML IV SOLN
0.5000 mg | INTRAVENOUS | Status: DC | PRN
Start: 2021-01-26 — End: 2021-01-28
  Administered 2021-01-26 – 2021-01-27 (×3): 0.5 mg via INTRAVENOUS
  Filled 2021-01-26 (×3): qty 1

## 2021-01-26 MED ORDER — FLUTICASONE PROPIONATE 50 MCG/ACT NA SUSP
1.0000 | Freq: Every day | NASAL | Status: DC
  Administered 2021-01-27 – 2021-01-30 (×4): 1 via NASAL
  Filled 2021-01-26 (×2): qty 16

## 2021-01-26 MED ORDER — ONDANSETRON HCL 4 MG/2ML IJ SOLN: 4.0000 mg | Freq: Four times a day (QID) | INTRAMUSCULAR | Status: DC | PRN

## 2021-01-26 NOTE — Consult Note (Signed)
Reason for Consult:right hip pain after mechanical fall Referring Physician: EDP  [name] Barker is an 87 y.o. female.  HPI: 87 yo female who reportedly fell at home while using a walker. The patient was unable to stand after fall and complained of right hip pain. No other complaints  Past Medical History:  Diagnosis Date  . Colon cancer ([place])   . COVID-19   . DNR (do not resuscitate)   . Dyslipidemia   . History of CVA (cerebrovascular accident)     History reviewed. No pertinent surgical history.  History reviewed. No pertinent family history.  Social History:  reports that she has never smoked. She has never used smokeless tobacco. She reports previous alcohol use. She reports that she does not use drugs.  Allergies: No Known Allergies  Medications: I have reviewed the patient's current medications.  Results for orders placed or performed during the hospital encounter of 01/26/21 (from the past 48 hour(s))  Basic metabolic panel     Status: Abnormal   Collection Time: 01/26/21  2:00 PM  Result Value Ref Range   Sodium 137 135 - 145 mmol/L   Potassium 4.2 3.5 - 5.1 mmol/L   Chloride 99 98 - 111 mmol/L   CO2 27 22 - 32 mmol/L   Glucose, Bld 129 (H) 70 - 99 mg/dL    Comment: Glucose reference range applies only to samples taken after fasting for at least 8 hours.   BUN 13 8 - 23 mg/dL   Creatinine, Ser 0.54 0.44 - 1.00 mg/dL   Calcium 8.6 (L) 8.9 - 10.3 mg/dL   GFR, Estimated >60 >60 mL/min    Comment: (NOTE) Calculated using the CKD-EPI Creatinine Equation (2021)    Anion gap 11 5 - 15    Comment: Performed at [facility] [address], [place], [place] [address]  Protime-INR     Status: None   Collection Time: 01/26/21  2:00 PM  Result Value Ref Range   Prothrombin Time 13.4 11.4 - 15.2 seconds   INR 1.1 0.8 - 1.2    Comment: (NOTE) INR goal varies based on device and disease states. Performed at [facility], [place] [address], [place],  [place] [address]   Type and screen [place]     Status: None   Collection Time: 01/26/21  2:00 PM  Result Value Ref Range   ABO/RH(D) O POS    Antibody Screen NEG    Sample Expiration      01/29/2021,2359 Performed at [facility], [place] [address], [place], [place] [address]   CBC with Differential/Platelet     Status: Abnormal   Collection Time: 01/26/21  2:37 PM  Result Value Ref Range   WBC 6.3 4.0 - 10.5 K/uL   RBC 3.24 (L) 3.87 - 5.11 MIL/uL   Hemoglobin 9.6 (L) 12.0 - 15.0 g/dL   HCT 31.1 (L) 36.0 - 46.0 %   MCV 96.0 80.0 - 100.0 fL   MCH 29.6 26.0 - 34.0 pg   MCHC 30.9 30.0 - 36.0 g/dL   RDW 13.4 11.5 - 15.5 %   Platelets 242 150 - 400 K/uL   nRBC 0.0 0.0 - 0.2 %   Neutrophils Relative % 68 %   Neutro Abs 4.3 1.7 - 7.7 K/uL   Lymphocytes Relative 22 %   Lymphs Abs 1.4 0.7 - 4.0 K/uL   Monocytes Relative 8 %   Monocytes Absolute 0.5 0.1 - 1.0 K/uL  Eosinophils Relative 0 %   Eosinophils Absolute 0.0 0.0 - 0.5 K/uL   Basophils Relative 1 %   Basophils Absolute 0.0 0.0 - 0.1 K/uL   Immature Granulocytes 1 %   Abs Immature Granulocytes 0.04 0.00 - 0.07 K/uL    Comment: Performed at [facility] [address], [place], [place] [address]  SARS Coronavirus 2 by RT PCR (hospital order, performed in [facility] hospital lab) Nasopharyngeal Nasopharyngeal Swab     Status: Abnormal   Collection Time: 01/26/21  2:46 PM   Specimen: Nasopharyngeal Swab  Result Value Ref Range   SARS Coronavirus 2 POSITIVE (A) NEGATIVE    Comment: RESULT CALLED TO, READ BACK BY AND VERIFIED WITH: M,CARDUFF @1625  01/26/21 EB (NOTE) SARS-CoV-2 target nucleic acids are DETECTED  SARS-CoV-2 RNA is generally detectable in upper respiratory specimens  during the acute phase of infection.  Positive results are indicative  of the presence of the identified virus, but do not rule out bacterial infection or co-infection with other pathogens not detected by the test.   Clinical correlation with patient history and  other diagnostic information is necessary to determine patient infection status.  The expected result is negative.  Fact Sheet for Patients:   StrictlyIdeas.no   Fact Sheet for Healthcare Providers:   BankingDealers.co.za    This test is not yet approved or cleared by the [place] FDA and  has been authorized for detection and/or diagnosis of SARS-CoV-2 by FDA under an Emergency Use Authorization (EUA).  This EUA will remain in effect (meaning this test can b e used) for the duration of  the COVID-19 declaration under Section 564(b)(1) of the Act, 21 U.S.C. section 360-bbb-3(b)(1), unless the authorization is terminated or revoked sooner.  Performed at [facility], [place] [address], [place], [place] [address]     DG Pelvis 1-2 Views  Result Date: 01/26/2021 CLINICAL DATA:  Fall, visibly shortened RIGHT leg, on blood thinners EXAM: PELVIS - 1-2 VIEW COMPARISON:  Portable exam 1425 hours without priors for comparison FINDINGS: Comminuted displaced and angulated intertrochanteric fracture RIGHT femur. No dislocation. Narrowing of the hip joints bilaterally. Orthopedic hardware at LEFT femoral diaphysis. Pelvis intact with SI joints symmetric. IMPRESSION: Comminuted displaced and angulated intertrochanteric fracture RIGHT femur. Electronically Signed   By: Lavonia Dana M.D.   On: 01/26/2021 14:55   CT HEAD WO CONTRAST  Result Date: 01/26/2021 CLINICAL DATA:  Head trauma.  Patient on blood thinners EXAM: CT HEAD WITHOUT CONTRAST TECHNIQUE: Contiguous axial images were obtained from the base of the skull through the vertex without intravenous contrast. COMPARISON:  None. FINDINGS: Brain: No acute intracranial hemorrhage. No focal mass lesion. No CT evidence of acute infarction. No midline shift or mass effect. No hydrocephalus. Basilar cisterns are patent. Small lacunar infarction brainstem  measures 3 mm on image 12/3). There are periventricular and subcortical white matter hypodensities. Generalized cortical atrophy. Vascular: No hyperdense vessel or unexpected calcification. Skull: Normal. Negative for fracture or focal lesion. Sinuses/Orbits: Paranasal sinuses and mastoid air cells are clear. Orbits are clear. Other: None. IMPRESSION: 1. No acute intracranial findings. 2. Atrophy and white matter microvascular disease. Electronically Signed   By: Suzy Bouchard M.D.   On: 01/26/2021 15:35   DG Chest Port 1 View  Result Date: 01/26/2021 CLINICAL DATA:  Clearance trauma, fall EXAM: PORTABLE CHEST 1 VIEW COMPARISON:  None. FINDINGS: Patient rotated rightward. Enlarged cardiac silhouette. Ectatic aorta. No effusion, infiltrate or pneumothorax. No pulmonary contusion. No pleural  fluid. No acute osseous abnormality. IMPRESSION: 1. Cardiomegaly. 2. No acute cardiopulmonary process. Electronically Signed   By: Suzy Bouchard M.D.   On: 01/26/2021 14:55   DG FEMUR, MIN 2 VIEWS RIGHT  Result Date: 01/26/2021 CLINICAL DATA:  Pain EXAM: RIGHT FEMUR 2 VIEWS COMPARISON:  None. FINDINGS: there is an acute comminuted intratrochanteric fracture of the proximal right femur. There is no dislocation. There are advanced degenerative changes of the right hip. There is no acute displaced fracture involving the distal femur. There are end-stage degenerative changes of the right knee, especially within the lateral compartment. IMPRESSION: 1. Acute comminuted intratrochanteric fracture of the proximal right femur. 2. Advanced degenerative changes of the right hip. 3. End-stage degenerative changes of the right knee. Electronically Signed   By: Constance Holster M.D.   On: 01/26/2021 15:46    Review of Systems Blood pressure (!) 158/76, pulse 70, temperature 98 F (36.7 C), temperature source Axillary, resp. rate 16, height 5\' 1"  (1.549 m), weight 63.5 kg, SpO2 100 %. Physical Exam Awake and responds to  verbal commands, poor historian Neck and back not tender Bilateral UEs with pain free AROM, sensation intact Right LE shortened and ER, foot is warm Left leg with pain free AROM  Assessment/Plan: Right basi-cervical femoral neck fracture/ peri-trochanteric hip fracture after mechanical fall Patient on Plavix, last dose yesterday Will discuss with Dr Alvan Dame who is on call for our group tomorrow for possible ORIF vs arthroplasty NPO after MN  Augustin Schooling 01/26/2021, 5:24 PM

## 2021-01-26 NOTE — ED Notes (Signed)
[name] Barker (daughter) can be reached at [phone] if needed.

## 2021-01-26 NOTE — H&P (Addendum)
History and Physical    [name] Barker [id number] DOB: 14-Mar-1921 DOA: 01/26/2021  PCP: Pricilla Holm Consultants:  [place] - orthopedics; [facility] - ENT; [place] - neurology Patient coming from:   Home - lives with daughter, husband; NOK: Daughter, Violeta Gelinas, [phone]  Chief Complaint: fall  HPI: [name] Barker is a 87 y.o. female with medical history significant of CVA, on Plavix; colon cancer; moderate vascular dementia; and HLD presenting with a fall.  She was in the bathroom and daughter helped her stand.  Her daughter left her there and she was sitting on her bottom in front of the sink.  Uncertain if she hit her head.  They assisted her onto the commode and wheelchair and laid her down.  Complained of mild hip pain - daughter noted R leg shorter than L.  I spoke with her daughter, who is a retired palliative care NP.  She has had a recent URI.  Has not had Flonase recently, so possibly rebound congestion and improving since starting.  No sick contacts.  Daughter prefers home rehab rather than SNF.  She is DNR.  Last ate last night, no Plavix dose this AM.    ED Course:  R hip fracture.  On Plavix.  Fell in bathroom.  Shattered her hip.  Orthopedics consulted.  Last took plavix yesterday.  URI symptoms for a couple of days, getting COVID tested.  Head CT pending.  Review of Systems: Unable to perform  Ambulatory Status:  Ambulates with walker  COVID Vaccine Status:   Complete plus booster  Past Medical History:  Diagnosis Date  . Colon cancer ([place])   . COVID-19   . DNR (do not resuscitate)   . Dyslipidemia   . History of CVA (cerebrovascular accident)     History reviewed. No pertinent surgical history.  Social History   Socioeconomic History  . Marital status: Married    Spouse name: Not on file  . Number of children: Not on file  . Years of education: Not on file  . Highest education level: Not on file  Occupational History  . Occupation:  retired  Tobacco Use  . Smoking status: Never Smoker  . Smokeless tobacco: Never Used  Substance and Sexual Activity  . Alcohol use: Not Currently  . Drug use: Never  . Sexual activity: Not on file  Other Topics Concern  . Not on file  Social History Narrative  . Not on file   Social Determinants of Health   Financial Resource Strain: Not on file  Food Insecurity: Not on file  Transportation Needs: Not on file  Physical Activity: Not on file  Stress: Not on file  Social Connections: Not on file  Intimate Partner Violence: Not on file    No Known Allergies  History reviewed. No pertinent family history.  Prior to Admission medications   Not on File    Physical Exam: Vitals:   01/26/21 1420 01/26/21 1424 01/26/21 1626  BP: (!) 158/60 (!) 158/76   Pulse: 70 70   Resp: 16 16   Temp: 98 F (36.7 C) 98 F (36.7 C)   TempSrc: Oral Axillary   SpO2: 98% 100%   Weight:   63.5 kg  Height:   5\' 1"  (1.549 m)      . General:  Appears somnolent and mildly uncomfortable; hip pain with minimal movement . Eyes:  EOMI, normal lids, iris . ENT: hard of hearing,  grossly normal lips & tongue, mmm . Neck:  no  LAD, masses or thyromegaly . Cardiovascular:  RRR, no m/r/g. No LE edema.  Marland Kitchen Respiratory:   CTA bilaterally with no wheezes/rales/rhonchi.  Normal respiratory effort.   . Abdomen:  soft, NT, ND, NABS . Skin:  no rash or induration seen on limited exam . Musculoskeletal:  R leg is shortened and externally rotated . Lower extremity:  No LE edema.  Limited foot exam with no ulcerations.  2+ distal pulses. Marland Kitchen Psychiatric:  Somnolent mood and affect, speech sparse, A&O x 1 . Neurologic:  Unable to effectively perform    Radiological Exams on Admission: Independently reviewed - see discussion in A/P where applicable  DG Pelvis 1-2 Views  Result Date: 01/26/2021 CLINICAL DATA:  Fall, visibly shortened RIGHT leg, on blood thinners EXAM: PELVIS - 1-2 VIEW COMPARISON:   Portable exam 1425 hours without priors for comparison FINDINGS: Comminuted displaced and angulated intertrochanteric fracture RIGHT femur. No dislocation. Narrowing of the hip joints bilaterally. Orthopedic hardware at LEFT femoral diaphysis. Pelvis intact with SI joints symmetric. IMPRESSION: Comminuted displaced and angulated intertrochanteric fracture RIGHT femur. Electronically Signed   By: Lavonia Dana M.D.   On: 01/26/2021 14:55   CT HEAD WO CONTRAST  Result Date: 01/26/2021 CLINICAL DATA:  Head trauma.  Patient on blood thinners EXAM: CT HEAD WITHOUT CONTRAST TECHNIQUE: Contiguous axial images were obtained from the base of the skull through the vertex without intravenous contrast. COMPARISON:  None. FINDINGS: Brain: No acute intracranial hemorrhage. No focal mass lesion. No CT evidence of acute infarction. No midline shift or mass effect. No hydrocephalus. Basilar cisterns are patent. Small lacunar infarction brainstem measures 3 mm on image 12/3). There are periventricular and subcortical white matter hypodensities. Generalized cortical atrophy. Vascular: No hyperdense vessel or unexpected calcification. Skull: Normal. Negative for fracture or focal lesion. Sinuses/Orbits: Paranasal sinuses and mastoid air cells are clear. Orbits are clear. Other: None. IMPRESSION: 1. No acute intracranial findings. 2. Atrophy and white matter microvascular disease. Electronically Signed   By: Suzy Bouchard M.D.   On: 01/26/2021 15:35   DG Chest Port 1 View  Result Date: 01/26/2021 CLINICAL DATA:  Clearance trauma, fall EXAM: PORTABLE CHEST 1 VIEW COMPARISON:  None. FINDINGS: Patient rotated rightward. Enlarged cardiac silhouette. Ectatic aorta. No effusion, infiltrate or pneumothorax. No pulmonary contusion. No pleural fluid. No acute osseous abnormality. IMPRESSION: 1. Cardiomegaly. 2. No acute cardiopulmonary process. Electronically Signed   By: Suzy Bouchard M.D.   On: 01/26/2021 14:55   DG FEMUR, MIN 2  VIEWS RIGHT  Result Date: 01/26/2021 CLINICAL DATA:  Pain EXAM: RIGHT FEMUR 2 VIEWS COMPARISON:  None. FINDINGS: there is an acute comminuted intratrochanteric fracture of the proximal right femur. There is no dislocation. There are advanced degenerative changes of the right hip. There is no acute displaced fracture involving the distal femur. There are end-stage degenerative changes of the right knee, especially within the lateral compartment. IMPRESSION: 1. Acute comminuted intratrochanteric fracture of the proximal right femur. 2. Advanced degenerative changes of the right hip. 3. End-stage degenerative changes of the right knee. Electronically Signed   By: Constance Holster M.D.   On: 01/26/2021 15:46    EKG: Independently reviewed.  NSR with rate 68; nonspecific ST changes with no evidence of acute ischemia   Labs on Admission: I have personally reviewed the available labs and imaging studies at the time of the admission.  Pertinent labs:   Glucose 129 WBC 6.3 Hgb 9.6 COVID pending   Assessment/Plan Principal Problem:   Closed right hip  fracture, initial encounter [name]) Active Problems:   History of CVA (cerebrovascular accident)   COVID-19   DNR (do not resuscitate)    R hip fracture -Mechanical fall resulting in hip fracture -Orthopedics consulted -NPO in anticipation of surgical repair -SCDs overnight, start Lovenox post-operatively (or as per ortho) -Pain control with Robxain, Vicodin, and Morphine prn -TOC team consult - daughter is retired palliative care NP who cares for her parents at home and prefers that her mother NOT go to rehab -Will need PT consult post-operatively -Hip fracture order set utilized  Prior CVA -On Plavix, last dose yesterday -Vascular dementia appears to be her most obvious persistent deficit  URI vs. Rebound congestion -> COVID POSITIVE -Continue Flonase, Allegra (claritin formulary substitution) -COVID test is pending but daughter  thinks this is very unlikely -*update - COVID test is positive; given mild symptoms will give IV Remdesivir x 3 days and check COVID labs  Depression -Continue Celexa  DNR -I have discussed code status with the patient's daughter and the patient would not desire resuscitation and would prefer to die a natural death should that situation arise. -She will need a gold out of facility DNR form at the time of discharge    DVT prophylaxis:  SCDs until approved for Lovenox by orthopedics Code Status:  DNR - confirmed with family Family Communication: None present; I spoke with the patient's daughter by telephone at the time of the admission  Disposition Plan:  Home once clinically improved Consults called: Orthopedics; SW, Nutrition; will need PT post-operatively  Admission status: Admit - It is my clinical opinion that admission to INPATIENT is reasonable and necessary because of the expectation that this patient will require hospital care that crosses at least 2 midnights to treat this condition based on the medical complexity of the problems presented.  Given the aforementioned information, the predictability of an adverse outcome is felt to be significant.    Karmen Bongo MD [facility]   How to contact the [facility] Attending or Consulting provider [place] or covering provider during after hours [place], for this patient?  1. Check the care team in [facility] and look for a) attending/consulting TRH provider listed and b) the [facility] team listed 2. Log into [email or web address] and use [place]'s universal password to access. If you do not have the password, please contact the hospital operator. 3. Locate the [facility] provider you are looking for under [facility] and page to a number that you can be directly reached. 4. If you still have difficulty reaching the provider, please page the [facility] (Director on Call) for the Hospitalists listed on amion for assistance.   01/26/2021, 5:03 PM

## 2021-01-26 NOTE — ED Notes (Signed)
[name] daug. POA Please call with an update  [phone]

## 2021-01-26 NOTE — Consult Note (Signed)
Responded to page, pt unavailable, no family present, staff will page again if further need of chaplain services.  Rev. Eloise Levels  Chaplain

## 2021-01-26 NOTE — ED Provider Notes (Signed)
Discussed the case with orthopedist Dr. Veverly Fells, they will consult on the patient while in the hospital.   Noemi Chapel, MD 01/26/21 1609

## 2021-01-26 NOTE — Progress Notes (Signed)
Hematoma to left hand caused by lab tech during lab draw

## 2021-01-26 NOTE — ED Notes (Signed)
Report called to inpatient RN. Patient awaiting transport to floor.

## 2021-01-26 NOTE — ED Triage Notes (Signed)
PT BIB [facility] for a mechanical fall while walking ot the bathroom.  Pt noted to have fallen to the side and hit head on a cabinet. PT takes Plavix.  PT has hx of rt hip replacement. Shortening and rotation noted to rt hip by EMS.

## 2021-01-26 NOTE — ED Provider Notes (Signed)
[facility] EMERGENCY DEPARTMENT Provider Note   CSN: [id number] Arrival date & time: 01/26/21  1411     History Chief Complaint  Patient presents with  . Fall    [name] Barker is a 87 y.o. female.  Patient is a 87 year old female with a history of prior stroke, dementia, living at home with her daughter presenting today as a level 2 trauma after a fall.  Patient is on Plavix for prior stroke and usually walks with a walker.  Daughter reported to EMS that patient was going to the bathroom and they were assisting her when her legs gave out and she fell to the right side hitting her head on the counter and falling to the floor on her right hip.  She was complaining of significant hip pain and was unable to stand or walk after the fall.  There was no noted loss of consciousness.  Patient is normally awake and alert to self daughter reports she is at her normal mental status.  Patient has been suffering from a sinus infection over the last month but no other acute medical changes.  Patient does not report pain in her right hip but denies a headache or neck pain.  She denies any trouble breathing or chest pain.  Daughter did report that patient has had frequent falls.  The history is provided by the EMS personnel and a caregiver.  Fall This is a new problem. The current episode started 1 to 2 hours ago. The problem occurs constantly. The problem has not changed since onset.Associated symptoms comments: Hit the right side of her head on a cabinet and landed on her right hip.  Unable to get up and severe pain to the right hip. The symptoms are aggravated by bending and twisting. The symptoms are relieved by narcotics. Treatments tried: fentanyl. The treatment provided moderate relief.       No past medical history on file.  There are no problems to display for this patient.      OB History   No obstetric history on file.     No family history on file.      Home Medications Prior to Admission medications   Not on File    Allergies    Patient has no allergy information on record.  Review of Systems   Review of Systems  All other systems reviewed and are negative.   Physical Exam Updated Vital Signs BP (!) 158/76 (BP Location: Left Arm)   Pulse 70   Temp 98 F (36.7 C) (Axillary)   Resp 16   SpO2 100%   Physical Exam Vitals and nursing note reviewed.  Constitutional:      General: She is not in acute distress.    Appearance: Normal appearance. She is well-developed, normal weight and well-nourished.  HENT:     Head: Normocephalic and atraumatic.  Eyes:     Extraocular Movements: EOM normal.     Pupils: Pupils are equal, round, and reactive to light.  Cardiovascular:     Rate and Rhythm: Normal rate and regular rhythm.     Pulses: Intact distal pulses.     Heart sounds: Normal heart sounds. No murmur heard. No friction rub.  Pulmonary:     Effort: Pulmonary effort is normal.     Breath sounds: Normal breath sounds. No wheezing or rales.     Comments: Crackles noted throughout the lungs Abdominal:     General: Bowel sounds are normal. There is no  distension.     Palpations: Abdomen is soft.     Tenderness: There is no abdominal tenderness. There is no guarding or rebound.  Musculoskeletal:        General: Tenderness and signs of injury present.     Cervical back: Normal range of motion and neck supple. No tenderness.     Right hip: Deformity and bony tenderness present. Decreased range of motion.     Left hip: Normal.     Right lower leg: No edema.     Left lower leg: No edema.       Legs:     Comments: No edema  Skin:    General: Skin is warm and dry.     Coloration: Skin is pale.     Findings: No rash.  Neurological:     Mental Status: She is alert.     Cranial Nerves: No cranial nerve deficit.     Motor: No weakness.     Comments: Oriented to person.  Able to follow commands.  Psychiatric:        Mood  and Affect: Mood and affect normal.     Comments: Calm and cooperative     ED Results / Procedures / Treatments   Labs (all labs ordered are listed, but only abnormal results are displayed) Labs Reviewed  CBC WITH DIFFERENTIAL/PLATELET - Abnormal; Notable for the following components:      Result Value   RBC 3.24 (*)    Hemoglobin 9.6 (*)    HCT 31.1 (*)    All other components within normal limits  SARS CORONAVIRUS 2 BY RT PCR (HOSPITAL ORDER, [facility])  PROTIME-INR  BASIC METABOLIC PANEL  CBC WITH DIFFERENTIAL/PLATELET  TYPE AND SCREEN  ABO/RH    EKG EKG Interpretation  Date/Time:  Saturday January 26 2021 14:19:56 EST Ventricular Rate:  68 PR Interval:    QRS Duration: 97 QT Interval:  452 QTC Calculation: 481 R Axis:   -22 Text Interpretation: Sinus rhythm Atrial premature complexes Borderline left axis deviation Borderline T abnormalities, anterior leads No previous tracing Confirmed by Blanchie Dessert [phone]) on 01/26/2021 2:36:57 PM   Radiology DG Pelvis 1-2 Views  Result Date: 01/26/2021 CLINICAL DATA:  Fall, visibly shortened RIGHT leg, on blood thinners EXAM: PELVIS - 1-2 VIEW COMPARISON:  Portable exam 1425 hours without priors for comparison FINDINGS: Comminuted displaced and angulated intertrochanteric fracture RIGHT femur. No dislocation. Narrowing of the hip joints bilaterally. Orthopedic hardware at LEFT femoral diaphysis. Pelvis intact with SI joints symmetric. IMPRESSION: Comminuted displaced and angulated intertrochanteric fracture RIGHT femur. Electronically Signed   By: Lavonia Dana M.D.   On: 01/26/2021 14:55   DG Chest Port 1 View  Result Date: 01/26/2021 CLINICAL DATA:  Clearance trauma, fall EXAM: PORTABLE CHEST 1 VIEW COMPARISON:  None. FINDINGS: Patient rotated rightward. Enlarged cardiac silhouette. Ectatic aorta. No effusion, infiltrate or pneumothorax. No pulmonary contusion. No pleural fluid. No acute osseous  abnormality. IMPRESSION: 1. Cardiomegaly. 2. No acute cardiopulmonary process. Electronically Signed   By: Suzy Bouchard M.D.   On: 01/26/2021 14:55    Procedures Procedures   Medications Ordered in ED Medications - No data to display  ED Course  I have reviewed the triage vital signs and the nursing notes.  Pertinent labs & imaging results that were available during my care of the patient were reviewed by me and considered in my medical decision making (see chart for details).    MDM Rules/Calculators/A&P  Elderly female on Plavix arriving today after a fall at home.  Patient was walking with her walker when her legs gave out.  She did hit her head per her daughter who was present for the fall.  Patient was unable to ambulate afterward and has shortening and pain in her right hip.  Patient did receive pain medication in route but currently is comfortable.  She denies pain anywhere else and low suspicion for other acute injury.  Hip fracture protocol initiated.  Patient since that she did hit her head and is on Plavix we will also do a CT of the brain but she is not displaying any acute neuro symptoms at this time.  2:59 PM Plain films show a right comminuted displaced and angulated intertrochanteric fracture.  Chest x-ray with cardiomegaly but no other acute process.  CBC with hemoglobin of 9.6 without old to compare but normal platelet count, INR within normal limits.  Patient's been n.p.o. since 730 last night.  She last took her Plavix yesterday.  Spoke with patient's daughter [name] Barker who reports she would be interested in having surgery and she sees Dr. Lyla Glassing with [facility].  Will consult them for further care.  We will plan on admitting the patient to the hospitalist once BMP returns.  MDM Number of Diagnoses or Management Options   Amount and/or Complexity of Data Reviewed Clinical lab tests: ordered and reviewed Tests in the radiology section of CPT:  ordered and reviewed Tests in the medicine section of CPT: ordered and reviewed Decide to obtain previous medical records or to obtain history from someone other than the patient: yes Obtain history from someone other than the patient: yes Review and summarize past medical records: yes Discuss the patient with other providers: yes Independent visualization of images, tracings, or specimens: yes  Risk of Complications, Morbidity, and/or Mortality Presenting problems: high Diagnostic procedures: moderate Management options: moderate  Patient Progress Patient progress: stable    Final Clinical Impression(s) / ED Diagnoses Final diagnoses:  Fall, initial encounter  Closed displaced intertrochanteric fracture of right femur, initial encounter [facility])    Rx / DC Orders ED Discharge Orders    None       Blanchie Dessert, MD 01/26/21 1502

## 2021-01-27 ENCOUNTER — Inpatient Hospital Stay (HOSPITAL_COMMUNITY): Payer: Medicare Other | Admitting: Anesthesiology

## 2021-01-27 ENCOUNTER — Inpatient Hospital Stay (HOSPITAL_COMMUNITY): Payer: Medicare Other

## 2021-01-27 ENCOUNTER — Encounter (HOSPITAL_COMMUNITY)
Admission: EM | Disposition: A | Discharge status: Home Health | Payer: Self-pay | Source: Home / Self Care | Attending: Internal Medicine

## 2021-01-27 DIAGNOSIS — S72001A Fracture of unspecified part of neck of right femur, initial encounter for closed fracture: Secondary | ICD-10-CM | POA: Diagnosis not present

## 2021-01-27 DIAGNOSIS — R7989 Other specified abnormal findings of blood chemistry: Secondary | ICD-10-CM | POA: Diagnosis not present

## 2021-01-27 DIAGNOSIS — U071 COVID-19: Secondary | ICD-10-CM | POA: Diagnosis not present

## 2021-01-27 HISTORY — PX: INTRAMEDULLARY (IM) NAIL INTERTROCHANTERIC: SHX5875

## 2021-01-27 LAB — COMPREHENSIVE METABOLIC PANEL
ALT: 15 U/L (ref 0–44)
AST: 27 U/L (ref 15–41)
Albumin: 3.3 g/dL — ABNORMAL LOW (ref 3.5–5.0)
Alkaline Phosphatase: 57 U/L (ref 38–126)
Anion gap: 11 (ref 5–15)
BUN: 20 mg/dL (ref 8–23)
CO2: 28 mmol/L (ref 22–32)
Calcium: 8.5 mg/dL — ABNORMAL LOW (ref 8.9–10.3)
Chloride: 98 mmol/L (ref 98–111)
Creatinine, Ser: 0.65 mg/dL (ref 0.44–1.00)
GFR, Estimated: >60 mL/min (ref >60)
Glucose, Bld: 145 mg/dL — ABNORMAL HIGH (ref 70–99)
Potassium: 3.3 mmol/L — ABNORMAL LOW (ref 3.5–5.1)
Sodium: 137 mmol/L (ref 135–145)
Total Bilirubin: 0.2 mg/dL — ABNORMAL LOW (ref 0.3–1.2)
Total Protein: 6.7 g/dL (ref 6.5–8.1)

## 2021-01-27 LAB — CBC
HCT: 29.2 % — ABNORMAL LOW (ref 36.0–46.0)
Hemoglobin: 9.1 g/dL — ABNORMAL LOW (ref 12.0–15.0)
MCH: 29.7 pg (ref 26.0–34.0)
MCHC: 31.2 g/dL (ref 30.0–36.0)
MCV: 95.4 fL (ref 80.0–100.0)
Platelets: 249 10*3/uL (ref 150–400)
RBC: 3.06 MIL/uL — ABNORMAL LOW (ref 3.87–5.11)
RDW: 13.5 % (ref 11.5–15.5)
WBC: 8.7 10*3/uL (ref 4.0–10.5)
nRBC: 0 % (ref 0.0–0.2)

## 2021-01-27 LAB — D-DIMER, QUANTITATIVE: D-Dimer, Quant: 14.43 ug/mL-FEU — ABNORMAL HIGH (ref 0.00–0.50)

## 2021-01-27 LAB — PHOSPHORUS: Phosphorus: 3.9 mg/dL (ref 2.5–4.6)

## 2021-01-27 LAB — SURGICAL PCR SCREEN
MRSA, PCR: NEGATIVE
Staphylococcus aureus: NEGATIVE

## 2021-01-27 LAB — FERRITIN: Ferritin: 77 ng/mL (ref 11–307)

## 2021-01-27 LAB — MAGNESIUM: Magnesium: 1.9 mg/dL (ref 1.7–2.4)

## 2021-01-27 LAB — C-REACTIVE PROTEIN: CRP: 4.4 mg/dL — ABNORMAL HIGH (ref <1.0)

## 2021-01-27 SURGERY — FIXATION, FRACTURE, INTERTROCHANTERIC, WITH INTRAMEDULLARY ROD
Anesthesia: General | Site: Hip | Laterality: Right

## 2021-01-27 MED ORDER — CEFAZOLIN SODIUM-DEXTROSE 2-4 GM/100ML-% IV SOLN
2.0000 g | Freq: Four times a day (QID) | INTRAVENOUS | Status: AC
  Administered 2021-01-27 – 2021-01-28 (×2): 2 g via INTRAVENOUS
  Filled 2021-01-27 (×2): qty 100

## 2021-01-27 MED ORDER — CHLORHEXIDINE GLUCONATE CLOTH 2 % EX PADS
6.0000 | MEDICATED_PAD | Freq: Every day | CUTANEOUS | Status: DC
  Administered 2021-01-27 – 2021-01-30 (×4): 6 via TOPICAL

## 2021-01-27 MED ORDER — ONDANSETRON HCL 4 MG/2ML IJ SOLN: 4.0000 mg | Freq: Four times a day (QID) | INTRAMUSCULAR | Status: DC | PRN

## 2021-01-27 MED ORDER — SUGAMMADEX SODIUM 200 MG/2ML IV SOLN
INTRAVENOUS | Status: DC | PRN
  Administered 2021-01-27: 150 mg via INTRAVENOUS

## 2021-01-27 MED ORDER — DOCUSATE SODIUM 100 MG PO CAPS: 100.0000 mg | ORAL_CAPSULE | Freq: Two times a day (BID) | ORAL | Status: DC

## 2021-01-27 MED ORDER — TRANEXAMIC ACID-NACL 1000-0.7 MG/100ML-% IV SOLN
INTRAVENOUS | Status: DC | PRN
  Administered 2021-01-27: 1000 mg via INTRAVENOUS

## 2021-01-27 MED ORDER — PHENYLEPHRINE HCL-NACL 10-0.9 MG/250ML-% IV SOLN
INTRAVENOUS | Status: DC | PRN
  Administered 2021-01-27: 30 ug/min via INTRAVENOUS

## 2021-01-27 MED ORDER — VASOPRESSIN 20 UNIT/ML IV SOLN
INTRAVENOUS | Status: AC
  Filled 2021-01-27: qty 1

## 2021-01-27 MED ORDER — FENTANYL CITRATE (PF) 100 MCG/2ML IJ SOLN: 25.0000 ug | INTRAMUSCULAR | Status: DC | PRN

## 2021-01-27 MED ORDER — TRANEXAMIC ACID-NACL 1000-0.7 MG/100ML-% IV SOLN
INTRAVENOUS | Status: AC
  Filled 2021-01-27: qty 100

## 2021-01-27 MED ORDER — ROCURONIUM BROMIDE 10 MG/ML (PF) SYRINGE
PREFILLED_SYRINGE | INTRAVENOUS | Status: DC | PRN
  Administered 2021-01-27: 45 mg via INTRAVENOUS

## 2021-01-27 MED ORDER — PHENOL 1.4 % MT LIQD: 1.0000 | OROMUCOSAL | Status: DC | PRN

## 2021-01-27 MED ORDER — METOCLOPRAMIDE HCL 5 MG/ML IJ SOLN: 5.0000 mg | Freq: Three times a day (TID) | INTRAMUSCULAR | Status: DC | PRN

## 2021-01-27 MED ORDER — METOCLOPRAMIDE HCL 5 MG PO TABS: 5.0000 mg | ORAL_TABLET | Freq: Three times a day (TID) | ORAL | Status: DC | PRN

## 2021-01-27 MED ORDER — FENTANYL CITRATE (PF) 250 MCG/5ML IJ SOLN
INTRAMUSCULAR | Status: AC
  Filled 2021-01-27: qty 5

## 2021-01-27 MED ORDER — LABETALOL HCL 5 MG/ML IV SOLN
INTRAVENOUS | Status: DC | PRN
  Administered 2021-01-27: 5 mg via INTRAVENOUS

## 2021-01-27 MED ORDER — 0.9 % SODIUM CHLORIDE (POUR BTL) OPTIME
TOPICAL | Status: DC | PRN
  Administered 2021-01-27: 1000 mL

## 2021-01-27 MED ORDER — PROPOFOL 10 MG/ML IV BOLUS
INTRAVENOUS | Status: DC | PRN
  Administered 2021-01-27: 60 mg via INTRAVENOUS

## 2021-01-27 MED ORDER — POTASSIUM CHLORIDE 2 MEQ/ML IV SOLN
INTRAVENOUS | Status: AC
  Filled 2021-01-27 (×3): qty 1000

## 2021-01-27 MED ORDER — LIDOCAINE 2% (20 MG/ML) 5 ML SYRINGE
INTRAMUSCULAR | Status: DC | PRN
  Administered 2021-01-27: 40 mg via INTRAVENOUS

## 2021-01-27 MED ORDER — FERROUS SULFATE 325 (65 FE) MG PO TABS
325.0000 mg | ORAL_TABLET | Freq: Three times a day (TID) | ORAL | Status: DC
  Administered 2021-01-28 – 2021-01-30 (×7): 325 mg via ORAL
  Filled 2021-01-27 (×7): qty 1

## 2021-01-27 MED ORDER — CLOPIDOGREL BISULFATE 75 MG PO TABS
75.0000 mg | ORAL_TABLET | Freq: Every day | ORAL | Status: DC
  Administered 2021-01-28: 75 mg via ORAL
  Filled 2021-01-27: qty 1

## 2021-01-27 MED ORDER — FENTANYL CITRATE (PF) 100 MCG/2ML IJ SOLN
INTRAMUSCULAR | Status: DC | PRN
  Administered 2021-01-27: 50 ug via INTRAVENOUS
  Administered 2021-01-27: 25 ug via INTRAVENOUS

## 2021-01-27 MED ORDER — METOPROLOL TARTRATE 5 MG/5ML IV SOLN: 5.0000 mg | Freq: Three times a day (TID) | INTRAVENOUS | Status: DC | PRN

## 2021-01-27 MED ORDER — DEXAMETHASONE SODIUM PHOSPHATE 10 MG/ML IJ SOLN
INTRAMUSCULAR | Status: DC | PRN
  Administered 2021-01-27: 4 mg via INTRAVENOUS

## 2021-01-27 MED ORDER — CEFAZOLIN SODIUM-DEXTROSE 2-3 GM-%(50ML) IV SOLR
INTRAVENOUS | Status: DC | PRN
  Administered 2021-01-27: 2 g via INTRAVENOUS

## 2021-01-27 MED ORDER — LACTATED RINGERS IV SOLN: INTRAVENOUS | Status: DC | PRN

## 2021-01-27 MED ORDER — ACETAMINOPHEN 325 MG PO TABS
650.0000 mg | ORAL_TABLET | Freq: Four times a day (QID) | ORAL | Status: DC | PRN
  Administered 2021-01-29: 650 mg via ORAL
  Filled 2021-01-27: qty 2

## 2021-01-27 MED ORDER — MENTHOL 3 MG MT LOZG: 1.0000 | LOZENGE | OROMUCOSAL | Status: DC | PRN

## 2021-01-27 MED ORDER — HEPARIN (PORCINE) 25000 UT/250ML-% IV SOLN
600.0000 [IU]/h | INTRAVENOUS | Status: AC
  Administered 2021-01-27: 600 [IU]/h via INTRAVENOUS
  Filled 2021-01-27 (×3): qty 250

## 2021-01-27 MED ORDER — ONDANSETRON HCL 4 MG PO TABS: 4.0000 mg | ORAL_TABLET | Freq: Four times a day (QID) | ORAL | Status: DC | PRN

## 2021-01-27 MED ORDER — HEPARIN SODIUM (PORCINE) 5000 UNIT/ML IJ SOLN
5000.0000 [IU] | Freq: Three times a day (TID) | INTRAMUSCULAR | Status: DC
  Administered 2021-01-27: 5000 [IU] via SUBCUTANEOUS
  Filled 2021-01-27: qty 1

## 2021-01-27 MED ORDER — ONDANSETRON HCL 4 MG/2ML IJ SOLN
INTRAMUSCULAR | Status: DC | PRN
  Administered 2021-01-27: 4 mg via INTRAVENOUS

## 2021-01-27 MED ORDER — MUPIROCIN 2 % EX OINT
1.0000 "application " | TOPICAL_OINTMENT | Freq: Two times a day (BID) | CUTANEOUS | Status: DC
  Administered 2021-01-27 – 2021-01-30 (×7): 1 via NASAL
  Filled 2021-01-27 (×2): qty 22

## 2021-01-27 MED ORDER — HYDRALAZINE HCL 20 MG/ML IJ SOLN: 10.0000 mg | Freq: Four times a day (QID) | INTRAMUSCULAR | Status: DC | PRN

## 2021-01-27 SURGICAL SUPPLY — 41 items
ADH SKN CLS APL DERMABOND .7 (GAUZE/BANDAGES/DRESSINGS) ×1
BIT DRILL 4.3MMS DISTAL GRDTED (BIT) IMPLANT
COVER PERINEAL POST (MISCELLANEOUS) ×2 IMPLANT
COVER SURGICAL LIGHT HANDLE (MISCELLANEOUS) ×2 IMPLANT
DERMABOND ADVANCED (GAUZE/BANDAGES/DRESSINGS) ×1
DERMABOND ADVANCED .7 DNX12 (GAUZE/BANDAGES/DRESSINGS) ×1 IMPLANT
DRAPE STERI IOBAN 125X83 (DRAPES) ×2 IMPLANT
DRILL 4.3MMS DISTAL GRADUATED (BIT) ×2
DRSG AQUACEL ADVANTAGE 4X5 (GAUZE/BANDAGES/DRESSINGS) IMPLANT
DRSG AQUACEL AG ADV 3.5X 4 (GAUZE/BANDAGES/DRESSINGS) ×1 IMPLANT
DRSG AQUACEL AG ADV 3.5X 6 (GAUZE/BANDAGES/DRESSINGS) ×1 IMPLANT
DRSG MEPILEX BORDER 4X12 (GAUZE/BANDAGES/DRESSINGS) ×2 IMPLANT
DURAPREP 26ML APPLICATOR (WOUND CARE) ×2 IMPLANT
ELECT REM PT RETURN 9FT ADLT (ELECTROSURGICAL) ×2
ELECTRODE REM PT RTRN 9FT ADLT (ELECTROSURGICAL) ×1 IMPLANT
FACESHIELD WRAPAROUND (MASK) ×2 IMPLANT
FACESHIELD WRAPAROUND OR TEAM (MASK) ×2 IMPLANT
GLOVE BIO SURGEON STRL SZ7.5 (GLOVE) ×4 IMPLANT
GLOVE INDICATOR 7.5 STRL GRN (GLOVE) ×3 IMPLANT
GOWN STRL REUS W/ TWL LRG LVL3 (GOWN DISPOSABLE) ×2 IMPLANT
GOWN STRL REUS W/TWL 2XL LVL3 (GOWN DISPOSABLE) ×1 IMPLANT
GOWN STRL REUS W/TWL LRG LVL3 (GOWN DISPOSABLE) ×4
GUIDEPIN 3.2X17.5 THRD DISP (PIN) ×1 IMPLANT
GUIDEWIRE BALL NOSE 100CM (WIRE) ×1 IMPLANT
HIP FRAC NAIL LAG SCR 10.5X100 (Orthopedic Implant) ×2 IMPLANT
KIT TURNOVER KIT B (KITS) ×2 IMPLANT
MANIFOLD NEPTUNE II (INSTRUMENTS) ×2 IMPLANT
NAIL HIP FX RT 11X320MM-130 (Nail) ×1 IMPLANT
NS IRRIG 1000ML POUR BTL (IV SOLUTION) ×2 IMPLANT
PACK GENERAL/GYN (CUSTOM PROCEDURE TRAY) ×2 IMPLANT
PAD ARMBOARD 7.5X6 YLW CONV (MISCELLANEOUS) ×4 IMPLANT
SCREW BONE CORTICAL 5.0X40 (Screw) ×1 IMPLANT
SCREW CANN THRD AFF 10.5X100 (Orthopedic Implant) IMPLANT
SUT MNCRL AB 4-0 PS2 18 (SUTURE) ×2 IMPLANT
SUT VIC AB 1 CT1 27 (SUTURE) ×2
SUT VIC AB 1 CT1 27XBRD ANBCTR (SUTURE) ×1 IMPLANT
SUT VIC AB 2-0 CT1 27 (SUTURE) ×4
SUT VIC AB 2-0 CT1 TAPERPNT 27 (SUTURE) ×1 IMPLANT
TOWEL GREEN STERILE (TOWEL DISPOSABLE) ×2 IMPLANT
TOWEL GREEN STERILE FF (TOWEL DISPOSABLE) ×2 IMPLANT
WATER STERILE IRR 1000ML POUR (IV SOLUTION) ×1 IMPLANT

## 2021-01-27 NOTE — Progress Notes (Signed)
Transition of Care [facility]) - CAGE-AID Screening   Patient Details  Name: [name] Barker MRN: [id number] Date of Birth: 1921-07-16   Clinical Narrative: Pt presents to hospital with right hip fracture. History of mild dementia,  from home with daughter. Pt denies any use of alcohol or other substances. Screening complete.    CAGE-AID Screening:    Have You Ever Felt You Ought to Cut Down on Your Drinking or Drug Use?: No Have People Annoyed You By Critizing Your Drinking Or Drug Use?: No Have You Felt Bad Or Guilty About Your Drinking Or Drug Use?: No Have You Ever Had a Drink or Used Drugs First Thing In The Morning to Steady Your Nerves or to Get Rid of a Hangover?: No CAGE-AID Score: 0

## 2021-01-27 NOTE — Progress Notes (Signed)
ANTICOAGULATION CONSULT NOTE  Pharmacy Consult for IV heparin Indication: DVT  No Known Allergies  Patient Measurements: Height: 5\' 1"  (154.9 cm) Weight: 63.5 kg (140 lb) IBW/kg (Calculated) : 47.8 Heparin Dosing Weight: 63.5 kg  Vital Signs: Temp: 99.2 F (37.3 C) (01/30 1620) Temp Source: Axillary (01/30 1620) BP: 130/66 (01/30 2000) Pulse Rate: 70 (01/30 2000)  Labs: Recent Labs    01/26/21 1400 01/26/21 1437 01/27/21 0231  HGB  --  9.6* 9.1*  HCT  --  31.1* 29.2*  PLT  --  242 249  LABPROT 13.4  --   --   INR 1.1  --   --   CREATININE 0.54  --  0.65    Estimated Creatinine Clearance: 34.3 mL/min (by C-G formula based on SCr of 0.65 mg/dL).   Medical History: Past Medical History:  Diagnosis Date  . Colon cancer ([place])   . COVID-19   . DNR (do not resuscitate)   . Dyslipidemia   . History of CVA (cerebrovascular accident)     Medications:  Infusions:  . heparin    . lactated ringers with kcl 75 mL/hr at 01/27/21 1054  . [MAR Hold] methocarbamol (ROBAXIN) IV    . [MAR Hold] remdesivir 100 mg in NS 100 mL 100 mg (01/27/21 0933)    Assessment: 87 yo female admitted with R hip fracture.  Also with DVT noted on doppler in R leg.  S/p OR for IM nail.  Pharmacy asked to start IV heparin for DVT 4 hrs after OR.  Procedure finished 2003 PM.  Goal of Therapy:  Heparin level 0.3-0.7 units/ml Monitor platelets by anticoagulation protocol: Yes   Plan:  Start IV heparin at 600 units/hr.  Will dose with caution given advanced age, recent surgery. Check heparin level 8 hrs after gtt starts. Daily heparin level and CBC. F/u plans for oral anticoagulation eventually.  [place] Crane, Roylene Reason, BCCP Clinical Pharmacist  01/27/2021 8:24 PM   [facility] pharmacy phone numbers are listed on [email or web address]

## 2021-01-27 NOTE — OR Nursing (Signed)
PATIENT HAS LIGHT AMBER URINE DRAINING IN URINARY CATHETER PRESENT ON ARRIVAL IN O.R. #6.

## 2021-01-27 NOTE — OR Nursing (Signed)
PATIENT'S DAUGHTER, [name] SERPE, NOTIFIED OF SURGERY STARTING. DAUGHTER WILL BE IN O.R. WAITING AREA.

## 2021-01-27 NOTE — Plan of Care (Signed)
  Problem: Pain Managment: Goal: General experience of comfort will improve Outcome: Progressing   Problem: Safety: Goal: Ability to remain free from injury will improve Outcome: Progressing   Problem: Skin Integrity: Goal: Risk for impaired skin integrity will decrease Outcome: Progressing   

## 2021-01-27 NOTE — Anesthesia Postprocedure Evaluation (Addendum)
Anesthesia Post Note  Patient: [name] Barker  Procedure(s) Performed: INTRAMEDULLARY (IM) NAIL INTERTROCHANTRIC (Right Hip)     Patient location during evaluation: Other Anesthesia Type: General Level of consciousness: awake and alert and patient cooperative Pain management: pain level controlled Vital Signs Assessment: post-procedure vital signs reviewed and stable Respiratory status: spontaneous breathing, nonlabored ventilation, respiratory function stable and patient connected to nasal cannula oxygen Cardiovascular status: blood pressure returned to baseline and stable Postop Assessment: no apparent nausea or vomiting Anesthetic complications: no   No complications documented.  Last Vitals:  Vitals:   01/27/21 2029 01/27/21 2036  BP: (!) 153/64 (!) 141/61  Pulse:    Resp:    Temp:    SpO2: 100% 99%    Last Pain:  Vitals:   01/27/21 2036  TempSrc:   PainSc: 0-No pain                 Vernadine Coombs,E. Nakari Bracknell

## 2021-01-27 NOTE — Progress Notes (Signed)
Patient has not voided since admitted. Bladder scanned 474ml. Notified MD, ordered F/C since patient is going to surgery this evening.

## 2021-01-27 NOTE — Anesthesia Procedure Notes (Signed)
Procedure Name: Intubation Date/Time: 01/27/2021 6:15 PM Performed by: Suzy Bouchard, CRNA Pre-anesthesia Checklist: Patient identified, Emergency Drugs available, Suction available, Patient being monitored and Timeout performed Patient Re-evaluated:Patient Re-evaluated prior to induction Oxygen Delivery Method: Circle system utilized Preoxygenation: Pre-oxygenation with 100% oxygen Induction Type: IV induction Ventilation: Oral airway inserted - appropriate to patient size and Mask ventilation without difficulty Laryngoscope Size: Glidescope and 4 Grade View: Grade I Tube type: Oral Tube size: 7.0 mm Number of attempts: 1 Airway Equipment and Method: Stylet and Video-laryngoscopy Placement Confirmation: ETT inserted through vocal cords under direct vision,  positive ETCO2 and breath sounds checked- equal and bilateral Secured at: 23 cm Tube secured with: Tape Dental Injury: Teeth and Oropharynx as per pre-operative assessment

## 2021-01-27 NOTE — Progress Notes (Signed)
Patient ID: [name] Barker, female   DOB: 1921-10-08, 87 y.o.   MRN: [id number] Subjective:       Patient with moderate dementia. History reviewed with daughter and in chart  Objective:   VITALS:   Vitals:   01/26/21 2200 01/27/21 0300  BP: (!) 170/78 139/64  Pulse:  66  Resp:  18  Temp:  98.8 F (37.1 C)  SpO2:  98%    General:  Appears somnolent and mildly uncomfortable; hip pain with minimal movement  Eyes:  EOMI, normal lids, iris  ENT: hard of hearing,  grossly normal lips & tongue, mmm  Neck:  no LAD, masses or thyromegaly  Cardiovascular:  RRR, no m/r/g. No LE edema.   Respiratory:   CTA bilaterally with no wheezes/rales/rhonchi.  Normal respiratory effort.    Abdomen:  soft, NT, ND, NABS  Skin:  no rash or induration seen on limited exam  Musculoskeletal:  R leg is shortened and externally rotated  Lower extremity:  No LE edema.  Limited foot exam with no ulcerations.  2+ distal pulses.  Psychiatric:  Somnolent mood and affect, speech sparse, A&O x 1  Neurologic:  Unable to effectively perform   LABS Recent Labs    01/26/21 1437 01/27/21 0231  HGB 9.6* 9.1*  HCT 31.1* 29.2*  WBC 6.3 8.7  PLT 242 249    Recent Labs    01/26/21 1400 01/27/21 0231  NA 137 137  K 4.2 3.3*  BUN 13 20  CREATININE 0.54 0.65  GLUCOSE 129* 145*    Recent Labs    01/26/21 1400  INR 1.1     Assessment/Plan:   Comminuted closed right peritrochanteric hip fracture   Plan: NPO Spoke with her daughter who agrees with plan to fix her hip for pain control We will try to get hip fracture addressed this am Consent ordered and will be signed over phone by her daughter

## 2021-01-27 NOTE — Progress Notes (Signed)
VASCULAR LAB    VASCULAR LAB    Bilateral lower extremity venous duplex has been performed.  See CV proc for preliminary results.  Messaged results to Dr. Candiss Norse via secure chat  Sharion Dove, RVT 01/27/2021, 12:39 PM

## 2021-01-27 NOTE — Transfer of Care (Signed)
Immediate Anesthesia Transfer of Care Note  Patient: [name] Barker  Procedure(s) Performed: INTRAMEDULLARY (IM) NAIL INTERTROCHANTRIC (Right Hip)  Patient Location: Recovered in OR- COVID +  Anesthesia Type:General  Level of Consciousness: drowsy and responds to stimulation  Airway & Oxygen Therapy: Patient Spontanous Breathing and Patient connected to face mask oxygen  Post-op Assessment: Report given to RN and Post -op Vital signs reviewed and stable  Post vital signs: Reviewed and stable  Last Vitals:  Vitals Value Taken Time  BP    Temp    Pulse    Resp    SpO2      Last Pain:  Vitals:   01/27/21 1620  TempSrc: Axillary  PainSc:          Complications: No complications documented.

## 2021-01-27 NOTE — Progress Notes (Signed)
PROGRESS NOTE                                                                                                                                                                                                             Patient Demographics:    [name] Barker, is a 87 y.o. female, DOB - Jul 17, 1921, [id number]  Outpatient Primary MD for the patient is Hoyt Koch, MD    LOS - 1  Admit date - 01/26/2021    Chief Complaint  Patient presents with  . Fall       Brief Narrative (HPI from H&P) - KRISANN LARY is a 87 y.o. female with medical history significant of CVA, on Plavix; colon cancer; moderate vascular dementia; and HLD presenting with a fall that happened in her bathroom witnessed by the daughter.  Subsequently complained of right hip pain was brought to the ER where she was found to have right hip fracture and admitted.   Subjective:    [name] Barker today in bed, pleasantly confused and appears to be in no distress, unable to answer questions reliably.   Assessment  & Plan :     1.  Mechanical fall with right hip fracture.  Discussed with orthopedic surgery, patient will be taken to the OR on 01/27/2021, n.p.o. except medications, she will be moderate to high risk for adverse cardiopulmonary outcome due to her age and underlying comorbidities, EKG is nonacute, she is not complaining of any chest pain although somewhat confused and unreliable historian.  She was on Plavix prior to surgery last dose presumably yesterday morning.  Post surgery will defer weightbearing and DVT prophylaxis options to orthopedic surgery.  Daughter wants HH PT upon discharge, daughter accepts the risks and benefits and wants to proceed with surgery.   2. Acute COVID-19 infection-most likely incidental, stable chest x-ray with no hypoxia.  3 days of remdesivir and monitor clinically  Encouraged the patient to sit up in  chair in the daytime use I-S and flutter valve for pulmonary toiletry and then prone in bed when at night.  Will advance activity and titrate down oxygen as possible.   Recent Labs  Lab 01/26/21 1400 01/26/21 1437 01/26/21 1446 01/26/21 1739 01/26/21 2140 01/27/21 0231  WBC  --  6.3  --   --   --  8.7  HGB  --  9.6*  --   --   --  9.1*  HCT  --  31.1*  --   --   --  29.2*  PLT  --  242  --   --   --  249  CRP  --   --   --  2.2*  --  4.4*  DDIMER  --   --   --  >20.00*  --  14.43*  PROCALCITON  --   --   --   --  <0.10  --   AST  --   --   --   --   --  27  ALT  --   --   --   --   --  15  ALKPHOS  --   --   --   --   --  57  BILITOT  --   --   --   --   --  0.2*  ALBUMIN  --   --   --   --   --  3.3*  INR 1.1  --   --   --   --   --   SARSCOV2NAA  --   --  POSITIVE*  --   --   --     3.  History of CVA.  Was on Plavix, resume once okay with orthopedics team.  For now supportive care.  4.  Extremely elevated D-dimer highly suspicious for DVT.  Check leg ultrasound.  For now prophylactic heparin until surgery.   5.  Underlying history of depression.  On Celexa.  6.  History of moderate vascular dementia.  At risk for delirium, minimize narcotics and benzodiazepines.  If delirious use Haldol instead.        Condition - Extremely Guarded  Family Communication  :  daughter [phone] on 01/27/2021   Code Status :  DNR  Consults  :  Ortho  Procedures  :    CT Head - non acute  ORIF planned for 01/27/2021  PUD Prophylaxis : None  Disposition Plan  :    Status is: Inpatient  Remains inpatient appropriate because:IV treatments appropriate due to intensity of illness or inability to take PO   Dispo: The patient is from: Home              Anticipated d/c is to: Home              Anticipated d/c date is: > 3 days              Patient currently is not medically stable to d/c.   Difficult to place patient No   DVT Prophylaxis  :   Heparin   Lab Results   Component Value Date   PLT 249 01/27/2021    Diet :  Diet Order            Diet NPO time specified Except for: Sips with Meds  Diet effective now                  Inpatient Medications  Scheduled Meds: . albuterol  2 puff Inhalation Q6H  . vitamin C  500 mg Oral Daily  . citalopram  10 mg Oral Daily  . docusate sodium  100 mg Oral BID  . famotidine  20 mg Oral QHS  . fluticasone  1 spray Each Nare Daily  . heparin injection (subcutaneous)  5,000 Units Subcutaneous Q8H  . loratadine  10 mg Oral Daily  .  zinc sulfate  220 mg Oral Daily   Continuous Infusions: . lactated ringers with kcl 75 mL/hr at 01/27/21 1054  . methocarbamol (ROBAXIN) IV    . remdesivir 100 mg in NS 100 mL 100 mg (01/27/21 0933)   PRN Meds:.acetaminophen, bisacodyl, chlorpheniramine-HYDROcodone, guaiFENesin-dextromethorphan, hydrALAZINE, HYDROcodone-acetaminophen, methocarbamol **OR** methocarbamol (ROBAXIN) IV, metoprolol tartrate, morphine injection, ondansetron (ZOFRAN) IV, polyethylene glycol  Antibiotics  :    Anti-infectives (From admission, onward)   Start     Dose/Rate Route Frequency Ordered Stop   01/27/21 1000  remdesivir 100 mg in sodium chloride 0.9 % 100 mL IVPB       "Followed by" Linked Group Details   100 mg 200 mL/hr over 30 Minutes Intravenous Daily 01/26/21 1701 01/29/21 0959   01/26/21 1715  remdesivir 200 mg in sodium chloride 0.9% 250 mL IVPB       "Followed by" Linked Group Details   200 mg 580 mL/hr over 30 Minutes Intravenous Once 01/26/21 1701 01/26/21 1817       Time Spent in minutes  30   Lala Lund M.D on 01/27/2021 at 11:06 AM  To page go to [email or web address]   [facility] -  Office  [phone]    See all Orders from today for further details    Objective:   Vitals:   01/26/21 2011 01/26/21 2200 01/27/21 0300 01/27/21 0752  BP: (!) 83/52 (!) 170/78 139/64 (!) 152/52  Pulse: 80  66 64  Resp: 17  18 16   Temp: 97.7 F (36.5 C)  98.8 F (37.1  C) 97.6 F (36.4 C)  TempSrc: Oral  Oral Oral  SpO2: 95%  98%   Weight:      Height:        Wt Readings from Last 3 Encounters:  01/26/21 63.5 kg     Intake/Output Summary (Last 24 hours) at 01/27/2021 1106 Last data filed at 01/26/2021 1817 Gross per 24 hour  Intake 289.53 ml  Output --  Net 289.53 ml     Physical Exam  Awake but pleasantly confused, unable to answer questions or follow commands reliably [place].AT,PERRAL Supple Neck,No JVD, No cervical lymphadenopathy appriciated.  Symmetrical Chest wall movement, Good air movement bilaterally, CTAB RRR,No Gallops,Rubs or new Murmurs, No Parasternal Heave +ve B.Sounds, Abd Soft, No tenderness, No organomegaly appriciated, No rebound - guarding or rigidity. No Cyanosis, pain with movement in the right hip and leg.    Data Review:    CBC Recent Labs  Lab 01/26/21 1437 01/27/21 0231  WBC 6.3 8.7  HGB 9.6* 9.1*  HCT 31.1* 29.2*  PLT 242 249  MCV 96.0 95.4  MCH 29.6 29.7  MCHC 30.9 31.2  RDW 13.4 13.5  LYMPHSABS 1.4  --   MONOABS 0.5  --   EOSABS 0.0  --   BASOSABS 0.0  --     Recent Labs  Lab 01/26/21 1400 01/26/21 1739 01/26/21 2140 01/27/21 0231  NA 137  --   --  137  K 4.2  --   --  3.3*  CL 99  --   --  98  CO2 27  --   --  28  GLUCOSE 129*  --   --  145*  BUN 13  --   --  20  CREATININE 0.54  --   --  0.65  CALCIUM 8.6*  --   --  8.5*  AST  --   --   --  27  ALT  --   --   --  15  ALKPHOS  --   --   --  57  BILITOT  --   --   --  0.2*  ALBUMIN  --   --   --  3.3*  MG  --   --   --  1.9  CRP  --  2.2*  --  4.4*  DDIMER  --  >20.00*  --  14.43*  PROCALCITON  --   --  <0.10  --   INR 1.1  --   --   --     ------------------------------------------------------------------------------------------------------------------ No results for input(s): CHOL, HDL, LDLCALC, TRIG, CHOLHDL, LDLDIRECT in the last 72 hours.  No results found for:  HGBA1C ------------------------------------------------------------------------------------------------------------------ No results for input(s): TSH, T4TOTAL, T3FREE, THYROIDAB in the last 72 hours.  Invalid input(s): FREET3  Cardiac Enzymes No results for input(s): CKMB, TROPONINI, MYOGLOBIN in the last 168 hours.  Invalid input(s): CK ------------------------------------------------------------------------------------------------------------------ No results found for: BNP  Micro Results Recent Results (from the past 240 hour(s))  SARS Coronavirus 2 by RT PCR (hospital order, performed in [facility] hospital lab) Nasopharyngeal Nasopharyngeal Swab     Status: Abnormal   Collection Time: 01/26/21  2:46 PM   Specimen: Nasopharyngeal Swab  Result Value Ref Range Status   SARS Coronavirus 2 POSITIVE (A) NEGATIVE Final    Comment: RESULT CALLED TO, READ BACK BY AND VERIFIED WITH: M,CARDUFF @1625  [id number] (NOTE) SARS-CoV-2 target nucleic acids are DETECTED  SARS-CoV-2 RNA is generally detectable in upper respiratory specimens  during the acute phase of infection.  Positive results are indicative  of the presence of the identified virus, but do not rule out bacterial infection or co-infection with other pathogens not detected by the test.  Clinical correlation with patient history and  other diagnostic information is necessary to determine patient infection status.  The expected result is negative.  Fact Sheet for Patients:   StrictlyIdeas.no   Fact Sheet for Healthcare Providers:   BankingDealers.co.za    This test is not yet approved or cleared by the [place] FDA and  has been authorized for detection and/or diagnosis of SARS-CoV-2 by FDA under an Emergency Use Authorization (EUA).  This EUA will remain in effect (meaning this test can b e used) for the duration of  the COVID-19 declaration under Section 564(b)(1) of the  Act, 21 U.S.C. section 360-bbb-3(b)(1), unless the authorization is terminated or revoked sooner.  Performed at [facility], [place] [address] [place], [place] [address]     Radiology Reports DG Pelvis 1-2 Views  Result Date: 01/26/2021 CLINICAL DATA:  Fall, visibly shortened RIGHT leg, on blood thinners EXAM: PELVIS - 1-2 VIEW COMPARISON:  Portable exam 1425 hours without priors for comparison FINDINGS: Comminuted displaced and angulated intertrochanteric fracture RIGHT femur. No dislocation. Narrowing of the hip joints bilaterally. Orthopedic hardware at LEFT femoral diaphysis. Pelvis intact with SI joints symmetric. IMPRESSION: Comminuted displaced and angulated intertrochanteric fracture RIGHT femur. Electronically Signed   By: Lavonia Dana M.D.   On: 01/26/2021 14:55   CT HEAD WO CONTRAST  Result Date: 01/26/2021 CLINICAL DATA:  Head trauma.  Patient on blood thinners EXAM: CT HEAD WITHOUT CONTRAST TECHNIQUE: Contiguous axial images were obtained from the base of the skull through the vertex without intravenous contrast. COMPARISON:  None. FINDINGS: Brain: No acute intracranial hemorrhage. No focal mass lesion. No CT evidence of acute infarction. No midline shift or mass effect. No hydrocephalus. Basilar cisterns are patent. Small lacunar infarction brainstem measures 3 mm  on image 12/3). There are periventricular and subcortical white matter hypodensities. Generalized cortical atrophy. Vascular: No hyperdense vessel or unexpected calcification. Skull: Normal. Negative for fracture or focal lesion. Sinuses/Orbits: Paranasal sinuses and mastoid air cells are clear. Orbits are clear. Other: None. IMPRESSION: 1. No acute intracranial findings. 2. Atrophy and white matter microvascular disease. Electronically Signed   By: Suzy Bouchard M.D.   On: 01/26/2021 15:35   DG Chest Port 1 View  Result Date: 01/26/2021 CLINICAL DATA:  Clearance trauma, fall EXAM: PORTABLE CHEST 1 VIEW COMPARISON:   None. FINDINGS: Patient rotated rightward. Enlarged cardiac silhouette. Ectatic aorta. No effusion, infiltrate or pneumothorax. No pulmonary contusion. No pleural fluid. No acute osseous abnormality. IMPRESSION: 1. Cardiomegaly. 2. No acute cardiopulmonary process. Electronically Signed   By: Suzy Bouchard M.D.   On: 01/26/2021 14:55   DG FEMUR, MIN 2 VIEWS RIGHT  Result Date: 01/26/2021 CLINICAL DATA:  Pain EXAM: RIGHT FEMUR 2 VIEWS COMPARISON:  None. FINDINGS: there is an acute comminuted intratrochanteric fracture of the proximal right femur. There is no dislocation. There are advanced degenerative changes of the right hip. There is no acute displaced fracture involving the distal femur. There are end-stage degenerative changes of the right knee, especially within the lateral compartment. IMPRESSION: 1. Acute comminuted intratrochanteric fracture of the proximal right femur. 2. Advanced degenerative changes of the right hip. 3. End-stage degenerative changes of the right knee. Electronically Signed   By: Constance Holster M.D.   On: 01/26/2021 15:46

## 2021-01-27 NOTE — Anesthesia Preprocedure Evaluation (Addendum)
Anesthesia Evaluation  Patient identified by MRN, date of birth, ID band Patient awake and Patient confused    Reviewed: Allergy & Precautions, NPO status , Patient's Chart, lab work & pertinent test results, Unable to perform ROS - Chart review only  History of Anesthesia Complications Negative for: history of anesthetic complications  Airway Mallampati: II  TM Distance: >3 FB Neck ROM: Full    Dental  (+) Partial Lower, Poor Dentition, Partial Upper, Dental Advisory Given   Pulmonary  01/26/2021 SARS coronavirus POSITIVE: no acute symptoms presently   breath sounds clear to auscultation       Cardiovascular (-) hypertension+ DVT   Rhythm:Irregular Rate:Normal  freq PACs   Neuro/Psych Dementia CVA (problems with word selection), Residual Symptoms    GI/Hepatic Neg liver ROS, GERD  Medicated and Controlled,  Endo/Other  negative endocrine ROS  Renal/GU negative Renal ROS     Musculoskeletal  (+) Arthritis , Osteoarthritis,    Abdominal   Peds  Hematology  (+) Blood dyscrasia (Hb 9.1), anemia , plavix   Anesthesia Other Findings   Reproductive/Obstetrics                           Anesthesia Physical Anesthesia Plan  ASA: III  Anesthesia Plan: General   Post-op Pain Management:    Induction: Intravenous  PONV Risk Score and Plan: 3 and Ondansetron and Dexamethasone  Airway Management Planned: Oral ETT  Additional Equipment: None  Intra-op Plan:   Post-operative Plan:   Informed Consent: I have reviewed the patients History and Physical, chart, labs and discussed the procedure including the risks, benefits and alternatives for the proposed anesthesia with the patient or authorized representative who has indicated his/her understanding and acceptance.   Patient has DNR.  Suspend DNR and Discussed DNR with power of attorney.   Consent reviewed with POA and Dental advisory  given  Plan Discussed with: CRNA  Anesthesia Plan Comments: (Discussed with daughter, [name], by telephone)      Anesthesia Quick Evaluation

## 2021-01-27 NOTE — Brief Op Note (Signed)
01/26/2021 - 01/27/2021  5:40 PM  PATIENT:  [name] Barker  87 y.o. female  PRE-OPERATIVE DIAGNOSIS:  Right comminuted peri trochanteric hip fracture  POST-OPERATIVE DIAGNOSIS:  Right comminuted reverse obliquity peritrochanteric hip fracture  PROCEDURE:  Procedure(s): INTRAMEDULLARY (IM) NAIL INTERTROCHANTRIC (Right)  [id number]  SURGEON:  Surgeon(s) and Role:    Paralee Cancel, MD - Primary  PHYSICIAN ASSISTANT: None  ANESTHESIA:   general  EBL:  About 100 cc  BLOOD ADMINISTERED:none  DRAINS: none   LOCAL MEDICATIONS USED:  NONE  SPECIMEN:  No Specimen  DISPOSITION OF SPECIMEN:  N/A  COUNTS:  YES  TOURNIQUET:  * No tourniquets in log *  DICTATION: .Other Dictation: Dictation Number [id number]  PLAN OF CARE: Admit to inpatient   PATIENT DISPOSITION:  PACU - hemodynamically stable.   Delay start of Pharmacological VTE agent (>24hrs) due to surgical blood loss or risk of bleeding: no

## 2021-01-28 ENCOUNTER — Encounter (HOSPITAL_COMMUNITY): Payer: Self-pay | Admitting: Orthopedic Surgery

## 2021-01-28 LAB — COMPREHENSIVE METABOLIC PANEL
ALT: 14 U/L (ref 0–44)
AST: 29 U/L (ref 15–41)
Albumin: 2.9 g/dL — ABNORMAL LOW (ref 3.5–5.0)
Alkaline Phosphatase: 43 U/L (ref 38–126)
Anion gap: 10 (ref 5–15)
BUN: 25 mg/dL — ABNORMAL HIGH (ref 8–23)
CO2: 28 mmol/L (ref 22–32)
Calcium: 8 mg/dL — ABNORMAL LOW (ref 8.9–10.3)
Chloride: 101 mmol/L (ref 98–111)
Creatinine, Ser: 0.62 mg/dL (ref 0.44–1.00)
GFR, Estimated: >60 mL/min (ref >60)
Glucose, Bld: 135 mg/dL — ABNORMAL HIGH (ref 70–99)
Potassium: 3.8 mmol/L (ref 3.5–5.1)
Sodium: 139 mmol/L (ref 135–145)
Total Bilirubin: 0.4 mg/dL (ref 0.3–1.2)
Total Protein: 6.1 g/dL — ABNORMAL LOW (ref 6.5–8.1)

## 2021-01-28 LAB — MAGNESIUM: Magnesium: 1.8 mg/dL (ref 1.7–2.4)

## 2021-01-28 LAB — CBC
HCT: 24.5 % — ABNORMAL LOW (ref 36.0–46.0)
Hemoglobin: 7.9 g/dL — ABNORMAL LOW (ref 12.0–15.0)
MCH: 30.7 pg (ref 26.0–34.0)
MCHC: 32.2 g/dL (ref 30.0–36.0)
MCV: 95.3 fL (ref 80.0–100.0)
Platelets: 232 10*3/uL (ref 150–400)
RBC: 2.57 MIL/uL — ABNORMAL LOW (ref 3.87–5.11)
RDW: 13.8 % (ref 11.5–15.5)
WBC: 8.9 10*3/uL (ref 4.0–10.5)
nRBC: 0 % (ref 0.0–0.2)

## 2021-01-28 LAB — HEPARIN LEVEL (UNFRACTIONATED)
Heparin Unfractionated: 0.21 IU/mL — ABNORMAL LOW (ref 0.30–0.70)
Heparin Unfractionated: 0.32 IU/mL (ref 0.30–0.70)

## 2021-01-28 LAB — C-REACTIVE PROTEIN: CRP: 12.1 mg/dL — ABNORMAL HIGH (ref <1.0)

## 2021-01-28 LAB — D-DIMER, QUANTITATIVE: D-Dimer, Quant: 3.39 ug/mL-FEU — ABNORMAL HIGH (ref 0.00–0.50)

## 2021-01-28 MED ORDER — HYDROCODONE-ACETAMINOPHEN 5-325 MG PO TABS
1.0000 | ORAL_TABLET | Freq: Four times a day (QID) | ORAL | Status: DC | PRN
Start: 2021-01-28 — End: 2021-01-30
  Administered 2021-01-30: 1 via ORAL
  Filled 2021-01-28: qty 1

## 2021-01-28 MED ORDER — POTASSIUM CHLORIDE CRYS ER 20 MEQ PO TBCR
40.0000 meq | EXTENDED_RELEASE_TABLET | Freq: Once | ORAL | Status: AC
  Administered 2021-01-28: 40 meq via ORAL
  Filled 2021-01-28: qty 2

## 2021-01-28 MED ORDER — ALBUTEROL SULFATE HFA 108 (90 BASE) MCG/ACT IN AERS: 2.0000 | INHALATION_SPRAY | Freq: Four times a day (QID) | RESPIRATORY_TRACT | Status: DC | PRN

## 2021-01-28 MED ORDER — PANTOPRAZOLE SODIUM 40 MG PO TBEC
40.0000 mg | DELAYED_RELEASE_TABLET | Freq: Every day | ORAL | Status: DC
  Administered 2021-01-28 – 2021-01-30 (×3): 40 mg via ORAL
  Filled 2021-01-28 (×3): qty 1

## 2021-01-28 MED ORDER — LACTATED RINGERS IV SOLN: INTRAVENOUS | Status: AC

## 2021-01-28 MED ORDER — DEXAMETHASONE SODIUM PHOSPHATE 10 MG/ML IJ SOLN
6.0000 mg | INTRAMUSCULAR | Status: DC
  Administered 2021-01-28 – 2021-01-29 (×2): 6 mg via INTRAVENOUS
  Filled 2021-01-28 (×2): qty 1

## 2021-01-28 NOTE — Progress Notes (Signed)
PROGRESS NOTE                                                                                                                                                                                                             Patient Demographics:    [name] Barker, is a 87 y.o. female, DOB - February 10, 1921, [id number]  Outpatient Primary MD for the patient is Hoyt Koch, MD    LOS - 2  Admit date - 01/26/2021    Chief Complaint  Patient presents with  . Fall       Brief Narrative (HPI from H&P) - [name] Barker is a 87 y.o. female with medical history significant of CVA, on Plavix; colon cancer; moderate vascular dementia; and HLD presenting with a fall that happened in her bathroom witnessed by the daughter.  Subsequently complained of right hip pain was brought to the ER where she was found to have right hip fracture and admitted.   Subjective:    [name] Barker today in bed, pleasantly confused and appears to be in no distress, unable to answer questions reliably.   Assessment  & Plan :     1.  Mechanical fall with right hip fracture. Post ORIF by Dr. Alvan Dame on 01/27/2021, seems to have tolerated the procedure well, she does have mild perioperative blood loss related anemia.  Has been typed crossed, since she has DVT in her right leg also which clinically appears to be subacute she will receive Eliquis for DVT treatment, toe-touch bearing in the right lower extremity per orthopedics.  Daughter would like to take her home with home health PT, if everything goes well will try home discharge on Wednesday.   2. Acute COVID-19 infection-most likely incidental, stable chest x-ray with no hypoxia.  3 days of remdesivir, since CRP is quite elevated now we will give her a short course of Decadron as well.  No hypoxia so far.  Encouraged the patient to sit up in chair in the daytime use I-S and flutter valve for  pulmonary toiletry and then prone in bed when at night.  Will advance activity and titrate down oxygen as possible.   Recent Labs  Lab 01/26/21 1400 01/26/21 1437 01/26/21 1446 01/26/21 1739 01/26/21 2140 01/27/21 0231 01/28/21 0839  WBC  --  6.3  --   --   --  8.7 8.9  HGB  --  9.6*  --   --   --  9.1* 7.9*  HCT  --  31.1*  --   --   --  29.2* 24.5*  PLT  --  242  --   --   --  249 232  CRP  --   --   --  2.2*  --  4.4* 12.1*  DDIMER  --   --   --  >20.00*  --  14.43* 3.39*  PROCALCITON  --   --   --   --  <0.10  --   --   AST  --   --   --   --   --  27 29  ALT  --   --   --   --   --  15 14  ALKPHOS  --   --   --   --   --  57 43  BILITOT  --   --   --   --   --  0.2* 0.4  ALBUMIN  --   --   --   --   --  3.3* 2.9*  INR 1.1  --   --   --   --   --   --   SARSCOV2NAA  --   --  POSITIVE*  --   --   --   --     3.  History of CVA.  Was on Plavix, now switched to Eliquis for #4 below.  4. R.Leg DVT with ++ D Dimer - clinically acute, Hep gtt >> Eliquis.   5. Underlying history of depression.  On Celexa.  6.  History of moderate vascular dementia.  At risk for delirium, minimize narcotics and benzodiazepines.  If delirious use Haldol instead.        Condition - Extremely Guarded  Family Communication  :  daughter [name] [phone] on 01/27/2021 , 01/28/21  Code Status :  DNR  Consults  :  Ortho  Procedures  :    CT Head - non acute  ORIF  01/27/2021  Leg ultrasound age indeterminate right lower extremity DVT  PUD Prophylaxis : None  Disposition Plan  :    Status is: Inpatient  Remains inpatient appropriate because:IV treatments appropriate due to intensity of illness or inability to take PO   Dispo: The patient is from: Home              Anticipated d/c is to: Home              Anticipated d/c date is: > 3 days              Patient currently is not medically stable to d/c.   Difficult to place patient No   DVT Prophylaxis  :   Heparin   Lab Results   Component Value Date   PLT 232 01/28/2021    Diet :  Diet Order            Diet regular Room service appropriate? No; Fluid consistency: Thin  Diet effective now                  Inpatient Medications  Scheduled Meds: . albuterol  2 puff Inhalation Q6H  . vitamin C  500 mg Oral Daily  . Chlorhexidine Gluconate Cloth  6 each Topical Daily  . citalopram  10 mg Oral Daily  . clopidogrel  75 mg Oral Daily  .  docusate sodium  100 mg Oral BID  . famotidine  20 mg Oral QHS  . ferrous sulfate  325 mg Oral TID PC  . fluticasone  1 spray Each Nare Daily  . loratadine  10 mg Oral Daily  . mupirocin ointment  1 application Nasal BID  . pantoprazole  40 mg Oral Daily  . zinc sulfate  220 mg Oral Daily   Continuous Infusions: . heparin 600 Units/hr (01/28/21 1140)  . lactated ringers     PRN Meds:.acetaminophen, bisacodyl, chlorpheniramine-HYDROcodone, guaiFENesin-dextromethorphan, hydrALAZINE, HYDROcodone-acetaminophen, metoprolol tartrate, [DISCONTINUED] ondansetron **OR** ondansetron (ZOFRAN) IV, polyethylene glycol  Antibiotics  :    Anti-infectives (From admission, onward)   Start     Dose/Rate Route Frequency Ordered Stop   01/28/21 0000  ceFAZolin (ANCEF) IVPB 2g/100 mL premix        2 g 200 mL/hr over 30 Minutes Intravenous Every 6 hours 01/27/21 2147 01/28/21 0609   01/27/21 1000  remdesivir 100 mg in sodium chloride 0.9 % 100 mL IVPB       "Followed by" Linked Group Details   100 mg 200 mL/hr over 30 Minutes Intravenous Daily 01/26/21 1701 01/28/21 1100   01/26/21 1715  remdesivir 200 mg in sodium chloride 0.9% 250 mL IVPB       "Followed by" Linked Group Details   200 mg 580 mL/hr over 30 Minutes Intravenous Once 01/26/21 1701 01/26/21 1817       Time Spent in minutes  30   Lala Lund M.D on 01/28/2021 at 11:49 AM  To page go to [email or web address]   [facility] -  Office  [phone]    See all Orders from today for further details     Objective:   Vitals:   01/27/21 2230 01/27/21 2330 01/28/21 0321 01/28/21 1100  BP: (!) 109/52 (!) 120/48 (!) 111/42 (!) 98/44  Pulse: 69 69 67   Resp: 16 17 18    Temp: 97.8 F (36.6 C) 98 F (36.7 C) 97.7 F (36.5 C) 98.4 F (36.9 C)  TempSrc: Oral Oral Axillary Oral  SpO2: 100% 100% 98%   Weight:      Height:        Wt Readings from Last 3 Encounters:  01/26/21 63.5 kg     Intake/Output Summary (Last 24 hours) at 01/28/2021 1149 Last data filed at 01/28/2021 1100 Gross per 24 hour  Intake 450 ml  Output 925 ml  Net -475 ml     Physical Exam  Awake but pleasantly confused, unable to answer questions or follow commands reliably Awake Alert, No new F.N deficits, Normal affect [place].AT,PERRAL Supple Neck,No JVD, No cervical lymphadenopathy appriciated.  Symmetrical Chest wall movement, Good air movement bilaterally, CTAB RRR,No Gallops, Rubs or new Murmurs, No Parasternal Heave +ve B.Sounds, Abd Soft, No tenderness, No organomegaly appriciated, No rebound - guarding or rigidity. No Cyanosis, right hip postop site stable with no appreciable hematoma.     Data Review:    CBC Recent Labs  Lab 01/26/21 1437 01/27/21 0231 01/28/21 0839  WBC 6.3 8.7 8.9  HGB 9.6* 9.1* 7.9*  HCT 31.1* 29.2* 24.5*  PLT 242 249 232  MCV 96.0 95.4 95.3  MCH 29.6 29.7 30.7  MCHC 30.9 31.2 32.2  RDW 13.4 13.5 13.8  LYMPHSABS 1.4  --   --   MONOABS 0.5  --   --   EOSABS 0.0  --   --   BASOSABS 0.0  --   --     Recent Labs  Lab 01/26/21 1400 01/26/21 1739 01/26/21 2140 01/27/21 0231 01/28/21 0839  NA 137  --   --  137 139  K 4.2  --   --  3.3* 3.8  CL 99  --   --  98 101  CO2 27  --   --  28 28  GLUCOSE 129*  --   --  145* 135*  BUN 13  --   --  20 25*  CREATININE 0.54  --   --  0.65 0.62  CALCIUM 8.6*  --   --  8.5* 8.0*  AST  --   --   --  27 29  ALT  --   --   --  15 14  ALKPHOS  --   --   --  57 43  BILITOT  --   --   --  0.2* 0.4  ALBUMIN  --   --   --  3.3* 2.9*   MG  --   --   --  1.9 1.8  CRP  --  2.2*  --  4.4* 12.1*  DDIMER  --  >20.00*  --  14.43* 3.39*  PROCALCITON  --   --  <0.10  --   --   INR 1.1  --   --   --   --     ------------------------------------------------------------------------------------------------------------------ No results for input(s): CHOL, HDL, LDLCALC, TRIG, CHOLHDL, LDLDIRECT in the last 72 hours.  No results found for: HGBA1C ------------------------------------------------------------------------------------------------------------------ No results for input(s): TSH, T4TOTAL, T3FREE, THYROIDAB in the last 72 hours.  Invalid input(s): FREET3  Cardiac Enzymes No results for input(s): CKMB, TROPONINI, MYOGLOBIN in the last 168 hours.  Invalid input(s): CK ------------------------------------------------------------------------------------------------------------------ No results found for: BNP  Micro Results Recent Results (from the past 240 hour(s))  SARS Coronavirus 2 by RT PCR (hospital order, performed in [facility] hospital lab) Nasopharyngeal Nasopharyngeal Swab     Status: Abnormal   Collection Time: 01/26/21  2:46 PM   Specimen: Nasopharyngeal Swab  Result Value Ref Range Status   SARS Coronavirus 2 POSITIVE (A) NEGATIVE Final    Comment: RESULT CALLED TO, READ BACK BY AND VERIFIED WITH: M,CARDUFF @1625  01/26/21 EB (NOTE) SARS-CoV-2 target nucleic acids are DETECTED  SARS-CoV-2 RNA is generally detectable in upper respiratory specimens  during the acute phase of infection.  Positive results are indicative  of the presence of the identified virus, but do not rule out bacterial infection or co-infection with other pathogens not detected by the test.  Clinical correlation with patient history and  other diagnostic information is necessary to determine patient infection status.  The expected result is negative.  Fact Sheet for Patients:   StrictlyIdeas.no   Fact Sheet  for Healthcare Providers:   BankingDealers.co.za    This test is not yet approved or cleared by the [place] FDA and  has been authorized for detection and/or diagnosis of SARS-CoV-2 by FDA under an Emergency Use Authorization (EUA).  This EUA will remain in effect (meaning this test can b e used) for the duration of  the COVID-19 declaration under Section 564(b)(1) of the Act, 21 U.S.C. section 360-bbb-3(b)(1), unless the authorization is terminated or revoked sooner.  Performed at [facility], [place] [address] [place], [place] [address]   Surgical PCR screen     Status: None   Collection Time: 01/27/21 11:55 AM   Specimen: Nasal Mucosa; Nasal Swab  Result Value Ref Range Status   MRSA, PCR NEGATIVE NEGATIVE  Final   Staphylococcus aureus NEGATIVE NEGATIVE Final    Comment: (NOTE) The Xpert SA Assay (FDA approved for NASAL specimens in patients 45 years of age and older), is one component of a comprehensive surveillance program. It is not intended to diagnose infection nor to guide or monitor treatment. Performed at [facility], [place] [address] [place], [place] [address]     Radiology Reports DG Pelvis 1-2 Views  Result Date: 01/26/2021 CLINICAL DATA:  Fall, visibly shortened RIGHT leg, on blood thinners EXAM: PELVIS - 1-2 VIEW COMPARISON:  Portable exam 1425 hours without priors for comparison FINDINGS: Comminuted displaced and angulated intertrochanteric fracture RIGHT femur. No dislocation. Narrowing of the hip joints bilaterally. Orthopedic hardware at LEFT femoral diaphysis. Pelvis intact with SI joints symmetric. IMPRESSION: Comminuted displaced and angulated intertrochanteric fracture RIGHT femur. Electronically Signed   By: Lavonia Dana M.D.   On: 01/26/2021 14:55   CT HEAD WO CONTRAST  Result Date: 01/26/2021 CLINICAL DATA:  Head trauma.  Patient on blood thinners EXAM: CT HEAD WITHOUT CONTRAST TECHNIQUE: Contiguous axial images were  obtained from the base of the skull through the vertex without intravenous contrast. COMPARISON:  None. FINDINGS: Brain: No acute intracranial hemorrhage. No focal mass lesion. No CT evidence of acute infarction. No midline shift or mass effect. No hydrocephalus. Basilar cisterns are patent. Small lacunar infarction brainstem measures 3 mm on image 12/3). There are periventricular and subcortical white matter hypodensities. Generalized cortical atrophy. Vascular: No hyperdense vessel or unexpected calcification. Skull: Normal. Negative for fracture or focal lesion. Sinuses/Orbits: Paranasal sinuses and mastoid air cells are clear. Orbits are clear. Other: None. IMPRESSION: 1. No acute intracranial findings. 2. Atrophy and white matter microvascular disease. Electronically Signed   By: Suzy Bouchard M.D.   On: 01/26/2021 15:35   DG Chest Port 1 View  Result Date: 01/26/2021 CLINICAL DATA:  Clearance trauma, fall EXAM: PORTABLE CHEST 1 VIEW COMPARISON:  None. FINDINGS: Patient rotated rightward. Enlarged cardiac silhouette. Ectatic aorta. No effusion, infiltrate or pneumothorax. No pulmonary contusion. No pleural fluid. No acute osseous abnormality. IMPRESSION: 1. Cardiomegaly. 2. No acute cardiopulmonary process. Electronically Signed   By: Suzy Bouchard M.D.   On: 01/26/2021 14:55   DG C-Arm 1-60 Min  Result Date: 01/27/2021 CLINICAL DATA:  Right femur intramedullary nail. EXAM: DG C-ARM 1-60 MIN; RIGHT FEMUR 2 VIEWS FLUOROSCOPY TIME:  Fluoroscopy Time:  1 minutes 47 seconds Radiation Exposure Index (if provided by the fluoroscopic device): 14.92 mGy Number of Acquired Spot Images: 3 COMPARISON:  Preoperative radiograph yesterday. FINDINGS: Three fluoroscopic spot views of the right femur obtained in the operating room. Intramedullary rod with trans trochanteric and distal locking screw traverse intertrochanteric femur fracture. Fracture is in improved alignment compared to preoperative imaging.  IMPRESSION: Intraoperative fluoroscopy after ORIF of intertrochanteric femur fracture. Electronically Signed   By: Keith Rake M.D.   On: 01/27/2021 19:56   DG FEMUR, MIN 2 VIEWS RIGHT  Result Date: 01/27/2021 CLINICAL DATA:  Right femur intramedullary nail. EXAM: DG C-ARM 1-60 MIN; RIGHT FEMUR 2 VIEWS FLUOROSCOPY TIME:  Fluoroscopy Time:  1 minutes 47 seconds Radiation Exposure Index (if provided by the fluoroscopic device): 14.92 mGy Number of Acquired Spot Images: 3 COMPARISON:  Preoperative radiograph yesterday. FINDINGS: Three fluoroscopic spot views of the right femur obtained in the operating room. Intramedullary rod with trans trochanteric and distal locking screw traverse intertrochanteric femur fracture. Fracture is in improved alignment compared to preoperative imaging. IMPRESSION: Intraoperative fluoroscopy after ORIF of intertrochanteric  femur fracture. Electronically Signed   By: Keith Rake M.D.   On: 01/27/2021 19:56   DG FEMUR, MIN 2 VIEWS RIGHT  Result Date: 01/26/2021 CLINICAL DATA:  Pain EXAM: RIGHT FEMUR 2 VIEWS COMPARISON:  None. FINDINGS: there is an acute comminuted intratrochanteric fracture of the proximal right femur. There is no dislocation. There are advanced degenerative changes of the right hip. There is no acute displaced fracture involving the distal femur. There are end-stage degenerative changes of the right knee, especially within the lateral compartment. IMPRESSION: 1. Acute comminuted intratrochanteric fracture of the proximal right femur. 2. Advanced degenerative changes of the right hip. 3. End-stage degenerative changes of the right knee. Electronically Signed   By: Constance Holster M.D.   On: 01/26/2021 15:46   VAS [place] LOWER EXTREMITY VENOUS (DVT)  Result Date: 01/27/2021  Lower Venous DVT Study Indications: Covid-19, elevated D-Dimer, hip fracture.  Limitations: Hip fracture, patient confusion. Comparison Study: No prior study Performing Technologist:  Sharion Dove RVS  Examination Guidelines: A complete evaluation includes B-mode imaging, spectral Doppler, color Doppler, and power Doppler as needed of all accessible portions of each vessel. Bilateral testing is considered an integral part of a complete examination. Limited examinations for reoccurring indications may be performed as noted. The reflux portion of the exam is performed with the patient in reverse Trendelenburg.  +---------+---------------+---------+-----------+----------+-------------------+ RIGHT    CompressibilityPhasicitySpontaneityPropertiesThrombus Aging      +---------+---------------+---------+-----------+----------+-------------------+ CFV      Full           Yes      Yes                                      +---------+---------------+---------+-----------+----------+-------------------+ SFJ      Full                                                             +---------+---------------+---------+-----------+----------+-------------------+ FV Prox  Full                                                             +---------+---------------+---------+-----------+----------+-------------------+ FV Mid   Full                                                             +---------+---------------+---------+-----------+----------+-------------------+ FV DistalFull                                                             +---------+---------------+---------+-----------+----------+-------------------+ PFV      Full                                                             +---------+---------------+---------+-----------+----------+-------------------+  POP                                                   Not well visualized +---------+---------------+---------+-----------+----------+-------------------+ PTV      None                                                              +---------+---------------+---------+-----------+----------+-------------------+ PERO                                                  Not well visualized +---------+---------------+---------+-----------+----------+-------------------+   +---------+---------------+---------+-----------+----------+-------------------+ LEFT     CompressibilityPhasicitySpontaneityPropertiesThrombus Aging      +---------+---------------+---------+-----------+----------+-------------------+ CFV      Full           Yes      Yes                                      +---------+---------------+---------+-----------+----------+-------------------+ SFJ      Full                                                             +---------+---------------+---------+-----------+----------+-------------------+ FV Prox  Full                                                             +---------+---------------+---------+-----------+----------+-------------------+ FV Mid   Full                                                             +---------+---------------+---------+-----------+----------+-------------------+ FV DistalFull                                                             +---------+---------------+---------+-----------+----------+-------------------+ PFV      Full                                                             +---------+---------------+---------+-----------+----------+-------------------+ POP  Yes      Yes                  patent by color and                                                       Doppler             +---------+---------------+---------+-----------+----------+-------------------+ PTV      Full                                                             +---------+---------------+---------+-----------+----------+-------------------+ PERO                                                  Not well visualized  +---------+---------------+---------+-----------+----------+-------------------+    Summary: RIGHT: - Findings consistent with age indeterminate deep vein thrombosis involving the right posterior tibial veins.  LEFT: - There is no evidence of deep vein thrombosis in the lower extremity. However, portions of this examination were limited- see technologist comments above.  *See table(s) above for measurements and observations. Electronically signed by Jamelle Haring on 01/27/2021 at 5:56:16 PM.    Final

## 2021-01-28 NOTE — Progress Notes (Signed)
[place] for IV heparin Indication: DVT  Allergies  Allergen Reactions  . Morphine And Related Other (See Comments)    Blisters at site and along vein  . Ativan [Lorazepam] Anxiety    Patient Measurements: Height: 5\' 1"  (154.9 cm) Weight: 63.5 kg (140 lb) IBW/kg (Calculated) : 47.8 Heparin Dosing Weight: 63.5 kg  Vital Signs: Temp: 99.5 F (37.5 C) (01/31 2000) Temp Source: Oral (01/31 2000) BP: 95/69 (01/31 1500) Pulse Rate: 80 (01/31 2000)  Labs: Recent Labs    01/26/21 1400 01/26/21 1437 01/26/21 1437 01/27/21 0231 01/28/21 0839 01/28/21 2000  HGB  --  9.6*   < > 9.1* 7.9*  --   HCT  --  31.1*  --  29.2* 24.5*  --   PLT  --  242  --  249 232  --   LABPROT 13.4  --   --   --   --   --   INR 1.1  --   --   --   --   --   HEPARINUNFRC  --   --   --   --  0.21* 0.32  CREATININE 0.54  --   --  0.65 0.62  --    < > = values in this interval not displayed.    Estimated Creatinine Clearance: 34.3 mL/min (by C-G formula based on SCr of 0.62 mg/dL).   Medical History: Past Medical History:  Diagnosis Date  . Colon cancer ([place])   . COVID-19   . DNR (do not resuscitate)   . Dyslipidemia   . History of CVA (cerebrovascular accident)     Medications:  Infusions:  . heparin 600 Units/hr (01/28/21 1140)    Assessment: 87 yo female admitted with R hip fracture. S/p OR for IM nail on 1/30. Doppler obtained 1/30 found to have age indeterminate in R leg, no DVT noted in L leg. Pharmacy asked to start IV heparin for DVT 4 hrs after OR.  Procedure finished 2003 PM.  Heparin level 0.32 is therapeutic on heparin 600 units/hr. No bleeding noted  Hgb 7.9. Plt wnl.   Goal of Therapy:  Heparin level 0.3-0.7 units/ml Monitor platelets by anticoagulation protocol: Yes   Plan:  Continue heparin at 600 units/hr- dosing cautiously due to advanced age and recent surgery Monitor heparin level, CBC and s/s of bleeding daily  Follow up  transition to oral anticoagulant   Nicole Cella, RPh Clinical Pharmacist Please check AMION for all [place] phone numbers After 10:00 PM, call [place] [phone]  01/28/2021 9:51 PM

## 2021-01-28 NOTE — Plan of Care (Signed)
  Problem: Nutrition: Goal: Adequate nutrition will be maintained Outcome: Progressing   Problem: Pain Managment: Goal: General experience of comfort will improve Outcome: Progressing   Problem: Safety: Goal: Ability to remain free from injury will improve Outcome: Progressing   Problem: Education: Goal: Knowledge of General Education information will improve Description: Including pain rating scale, medication(s)/side effects and non-pharmacologic comfort measures Outcome: Not Progressing   Problem: Health Behavior/Discharge Planning: Goal: Ability to manage health-related needs will improve Outcome: Not Progressing   Problem: Activity: Goal: Risk for activity intolerance will decrease Outcome: Not Progressing

## 2021-01-28 NOTE — Evaluation (Signed)
Physical Therapy Evaluation Patient Details Name: [name] Barker MRN: [id number] DOB: 1921/04/14 Today's Date: 01/28/2021   History of Present Illness  [name] Barker is a 87 y.o. female with medical history significant of CVA, on Plavix; colon cancer; moderate vascular dementia; and HLD presenting with a fall that happened in her bathroom witnessed by the daughter.  Subsequently complained of right hip pain was brought to the ER where she was found to have right hip fracture and admitted; now s/p ORIF, TWB; found to be covid positive  Clinical Impression   Pt admitted with above diagnosis and surgery. Pt was unable to give reliable information re: home setup, available assist, etc; Inferred from chart review that pt likely lives with her daughter; Her fall was in the bathroom witnessed by her daughter, so we know that she is mobile and participates in her ADLs at baseline; Presents to PT with significant functional defecits and dependencies; Required 2 person assist to get up to EOB and perform simulated transfers; Pt currently with functional limitations due to the deficits listed below (see PT Problem List). Pt will benefit from skilled PT to increase their independence and safety with mobility to allow discharge to the venue listed below.    At her current level of assist, SNF for rehab is appropriate; Still, noted pt's daughter would like to take her home; Given pt's limited weight bearing status, I anticiapte she will need 24 hour extensive assist, and will likely be at mostly wheelchair transfer level; If for home, the pt and her family will need ADL training -- will order OT per protocol     Follow Up Recommendations SNF    Equipment Recommendations  Rolling walker with 5" wheels;3in1 (PT);Wheelchair (measurements PT);Wheelchair cushion (measurements PT)    Recommendations for Other Services       Precautions / Restrictions Precautions Precautions: Fall Restrictions RLE  Weight Bearing: Touchdown weight bearing      Mobility  Bed Mobility Overal bed mobility: Needs Assistance Bed Mobility: Supine to Sit;Sit to Supine     Supine to sit: Total assist;+2 for physical assistance Sit to supine: Max assist;+2 for physical assistance;+2 for safety/equipment   General bed mobility comments: Required extensive assist for all aspect of bed mobility    Transfers Overall transfer level: Needs assistance Equipment used: 2 person hand held assist Transfers: Squat Pivot Transfers     Squat pivot transfers: +2 physical assistance;Max assist     General transfer comment: Simulated squat pivot tranfer at EOB to move toward HOB in prep for lying back down; close guard and monitoring for TWB  Ambulation/Gait                Stairs            Wheelchair Mobility    Modified Rankin (Stroke Patients Only)       Balance Overall balance assessment: Needs assistance   Sitting balance-Leahy Scale: Fair       Standing balance-Leahy Scale: Zero                               Pertinent Vitals/Pain Pain Assessment: Faces Faces Pain Scale: Hurts even more Pain Location: Noting grimace with RLE movement; pain seems to subside quickly Pain Descriptors / Indicators: Grimacing Pain Intervention(s): Monitored during session;Repositioned    Home Living Family/patient expects to be discharged to:: Unsure Noted pt's daughter would like to be able to bring her home  Additional Comments: I anticipate the need for SNF stay for rehab to maximize independence and safety with mobility and ADLs   Prior Function Level of Independence: Needs assistance         Comments: Pt unable to give a clear picture of her PLOF; will need more information     Hand Dominance        Extremity/Trunk Assessment   Upper Extremity Assessment Upper Extremity Assessment: Generalized weakness    Lower Extremity Assessment Lower  Extremity Assessment: RLE deficits/detail RLE Deficits / Details: grossly decr AROM and strength, limited by pain postop       Communication   Communication: HOH  Cognition Arousal/Alertness: Awake/alert Behavior During Therapy: WFL for tasks assessed/performed Overall Cognitive Status: No family/caregiver present to determine baseline cognitive functioning                                 General Comments: Pt awake and participating as much as she could during the session; noting her difficulty understanding, made even harder with masks and face sheilds      General Comments General comments (skin integrity, edema, etc.): Session conducted on 2L supplemental O2; Sats end of session 96%, HR 75; BP sitting EOB 95/69    Exercises     Assessment/Plan    PT Assessment Patient needs continued PT services  PT Problem List Decreased strength;Decreased range of motion;Decreased activity tolerance;Decreased balance;Decreased mobility;Decreased coordination;Decreased cognition;Decreased knowledge of use of DME;Decreased safety awareness;Decreased knowledge of precautions       PT Treatment Interventions DME instruction;Gait training;Functional mobility training;Therapeutic activities;Therapeutic exercise;Balance training;Patient/family education    PT Goals (Current goals can be found in the Care Plan section)  Acute Rehab PT Goals Patient Stated Goal: agreeable to sit up; stated "that wasn't so bad" at teh end of session PT Goal Formulation: Patient unable to participate in goal setting Time For Goal Achievement: 02/10/21 Potential to Achieve Goals: Fair    Frequency Min 2X/week; will incr freq as appropriate if push is for pt to dc home   Barriers to discharge        Co-evaluation               AM-PAC PT "6 Clicks" Mobility  Outcome Measure Help needed turning from your back to your side while in a flat bed without using bedrails?: A Lot Help needed moving  from lying on your back to sitting on the side of a flat bed without using bedrails?: A Lot Help needed moving to and from a bed to a chair (including a wheelchair)?: A Lot Help needed standing up from a chair using your arms (e.g., wheelchair or bedside chair)?: A Lot Help needed to walk in hospital room?: Total Help needed climbing 3-5 steps with a railing? : Total 6 Click Score: 10    End of Session Equipment Utilized During Treatment: Oxygen Activity Tolerance: Patient tolerated treatment well Patient left: in bed;with call bell/phone within reach;with bed alarm set (bed in semi-chair position) Nurse Communication: Mobility status PT Visit Diagnosis: Unsteadiness on feet (R26.81);Other abnormalities of gait and mobility (R26.89);Muscle weakness (generalized) (M62.81);Pain Pain - Right/Left: Right Pain - part of body: Leg    Time: 4742-5956 PT Time Calculation (min) (ACUTE ONLY): 20 min   Charges:   PT Evaluation $PT Eval Moderate Complexity: [place], [place] Pager [phone] Office  [phone]   [name] Barker 01/28/2021, 6:45 PM

## 2021-01-28 NOTE — Progress Notes (Signed)
Initial Nutrition Assessment  DOCUMENTATION CODES:   Not applicable  INTERVENTION:   -Ensure Enlive po TID, each supplement provides 350 kcal and 20 grams of protein -MVI with minerals daily  NUTRITION DIAGNOSIS:   Increased nutrient needs related to post-op healing as evidenced by estimated needs.  GOAL:   Patient will meet greater than or equal to 90% of their needs  MONITOR:   PO intake,Supplement acceptance,Weight trends,Labs,Skin,I & O's  REASON FOR ASSESSMENT:   Consult Assessment of nutrition requirement/status,Hip fracture protocol  ASSESSMENT:   [name] Barker is a 87 y.o. female with medical history significant of CVA, on Plavix; colon cancer; moderate vascular dementia; and HLD presenting with a fall.  She was in the bathroom and daughter helped her stand.  Her daughter left her there and she was sitting on her bottom in front of the sink.  Uncertain if she hit her head.  They assisted her onto the commode and wheelchair and laid her down.  Complained of mild hip pain - daughter noted R leg shorter than L.  Pt admitted with rt hip fracture.   1/30- s/p Procedure(s): INTRAMEDULLARY (IM) NAIL INTERTROCHANTRIC (Right)  [id number]  Reviewed I/O's: -275 ml x 24 hours and +15 ml since admission  UOP: 625 ml x 24 hours  Pt unavailable at time of visit.   Pt currently on a regular diet. However, noted meal completion 0%.   No wt history available to assess for changes at this time.   Pt with increased nutritional needs due to post-operative healing and would benefit from addition of oral nutrition supplements.   Medications reviewed and include vitamin C, decadron, colace, ferrous sulfate, zinc sulfate, and lactated ringers @ 100 ml/hr.   Labs reviewed.   Diet Order:   Diet Order            Diet regular Room service appropriate? No; Fluid consistency: Thin  Diet effective now                 EDUCATION NEEDS:   No education needs have been  identified at this time  Skin:  Skin Assessment: Skin Integrity Issues: Skin Integrity Issues:: Incisions Incisions: closed rt hip  Last BM:  Unknown  Height:   Ht Readings from Last 1 Encounters:  01/26/21 5\' 1"  (1.549 m)    Weight:   Wt Readings from Last 1 Encounters:  01/26/21 63.5 kg    Ideal Body Weight:  47.7 kg  BMI:  Body mass index is 26.45 kg/m.  Estimated Nutritional Needs:   Kcal:  1600-1800  Protein:  80-95 grams  Fluid:  > 1.6 L    Loistine Chance, RD, LDN, [place] Registered Dietitian II Certified Diabetes Care and Education Specialist Please refer to [facility] for RD and/or RD on-call/weekend/after hours pager

## 2021-01-28 NOTE — Op Note (Signed)
NAME: [name] Barker, [name] Barker MEDICAL RECORD [id number] ACCOUNT [id number] DATE OF BIRTH:[birth year] FACILITY: [facility] LOCATION: [facility] PHYSICIAN:Mykal Kirchman Marian Sorrow, MD  OPERATIVE REPORT  DATE OF PROCEDURE:  01/27/2021  PREOPERATIVE DIAGNOSIS:  Comminuted right peritrochanteric femur fracture.  POSTOPERATIVE DIAGNOSIS:  Right comminuted reverse obliquity peritrochanteric hip fracture.  PROCEDURE:  Open reduction internal fixation of right hip fracture utilizing a Biomet Zimmer nail 11 x 320 mm with a 100 mm lag screw and a distal interlock.  SURGEON:  Paralee Cancel, MD  ASSISTANT:  Surgical team.  ANESTHESIA:  General.  BLOOD LOSS:  About 100 mL.  DRAINS:  None.  COMPLICATIONS:  None apparent.  INDICATIONS:  The patient is a 87 year old female who unfortunately had a ground level fall while at home.  She had immediate pain and inability to bear weight and was brought to the Emergency Room.  Radiographs revealed a right peritrochanteric femur  fracture.  She was admitted to the medical team per protocol.  I was consulted for surgical management.  We reviewed the risks and indications of the procedure, risks of malunion, nonunion, need for future surgeries were reviewed.  Consent was obtained  for the benefit of pain relief.  DESCRIPTION OF PROCEDURE:  The patient was brought to the operative theater.  Once adequate anesthesia, preoperative antibiotics, Ancef administered as well as tranexamic acid, she was positioned supine on the fracture table.  With a padded perineal post  in place and the left lower extremity flexed and abducted out of the way with all bony prominences padded, particularly over the peroneal nerve as well as the right foot in a traction boot, I applied traction with internal rotation.  Fluoroscopy was  used to identify this reverse obliquity fracture with mild angulation.  I had the fracture reduced until the best in position it could be in both AP and lateral planes.   Once this was done, the right hip was prepped and draped in sterile fashion from  above the iliac crest to below the knee for a long nail.  We did perform a timeout identifying the patient, the planned procedure, and extremity.  At this point, fluoroscopy was brought back to the field.  The tip of the trochanter was identified.  An  incision was then made laterally.  Incision was taken down through  the gluteal fascia.  The guidewire was then inserted into the tip of the trochanter.  I then passed a guidewire using fluoroscopy in AP and lateral planes across the fracture site.  I  then drilled the proximal femur, opened and then used the starting awl to bypass the fracture site.  Once this was confirmed radiographically, we placed a ball-tipped guidewire to the knee, measured and selected a 320 mm nail.  I selected 11 mm nail.  We  reamed over the ball-tipped guidewire first to 11 and then up to 12.5 mm.  The nail was opened and placed onto the insertion jig and then passed down the femur across the fracture.  There remained mild displacement at the reverse segment with some  varus, but the medial calcar was lined up anatomically.  I determined that this was adequate from a fracture stability.  Then, using the insertion jig, I inserted a guidewire initially, which I thought was a little inferior on the AP view and in the  lateral view in the center to slight posterior.  I then measured and selected a 100 mm screw.  We drilled for this and then screwed the screw in.  The final screw position was slightly inferior, but on the anterior view more anterior than what was  initially expected, but I do not feel that this needed to be adjusted based on her bone quality.  I locked this in place as a fixed-angle device due to the reverse obliquity nature of the fracture.  Once this was completed under fluoroscopic imaging, a  perfect circle technique was utilized to place a screw into the dynamic slot.  This was  bicortical and confirmed radiographically.  Final x-rays were obtained.  The insertion jig was removed.  We irrigated all wounds.  The distal 2 incisions were closed  with 2-0 Vicryl and then Dermabond on the skin.  The proximal wound was closed in layers with #1 Vicryl and the gluteal fascia, 2-0 Vicryl in the subcutaneous layer and a running Monocryl stitch.  The wound was then cleaned, dried and dressed sterilely  using surgical glue and Aquacel dressing.  She was then brought to the recovery room in stable condition, tolerating the procedure well.  Postoperatively, we will try to limit her weightbearing, which will be a challenge related to her dementia.   Findings will be reviewed with her daughter.  IN/NUANCE  D:01/27/2021 T:01/28/2021 JOB:[id number]

## 2021-01-28 NOTE — Progress Notes (Addendum)
[facility] for IV heparin Indication: DVT  Allergies  Allergen Reactions  . Morphine And Related Other (See Comments)    Blisters at site and along vein  . Ativan [Lorazepam] Anxiety    Patient Measurements: Height: 5\' 1"  (154.9 cm) Weight: 63.5 kg (140 lb) IBW/kg (Calculated) : 47.8 Heparin Dosing Weight: 63.5 kg  Vital Signs: Temp: 97.7 F (36.5 C) (01/31 0321) Temp Source: Axillary (01/31 0321) BP: 111/42 (01/31 0321) Pulse Rate: 67 (01/31 0321)  Labs: Recent Labs    01/26/21 1400 01/26/21 1437 01/26/21 1437 01/27/21 0231 01/28/21 0839  HGB  --  9.6*   < > 9.1* 7.9*  HCT  --  31.1*  --  29.2* 24.5*  PLT  --  242  --  249 232  LABPROT 13.4  --   --   --   --   INR 1.1  --   --   --   --   HEPARINUNFRC  --   --   --   --  0.21*  CREATININE 0.54  --   --  0.65 0.62   < > = values in this interval not displayed.    Estimated Creatinine Clearance: 34.3 mL/min (by C-G formula based on SCr of 0.62 mg/dL).   Medical History: Past Medical History:  Diagnosis Date  . Colon cancer ([place])   . COVID-19   . DNR (do not resuscitate)   . Dyslipidemia   . History of CVA (cerebrovascular accident)     Medications:  Infusions:  . heparin 600 Units/hr (01/27/21 2305)  . remdesivir 100 mg in NS 100 mL 100 mg (01/27/21 0933)    Assessment: 87 yo female admitted with R hip fracture. S/p OR for IM nail on 1/30. Doppler obtained 1/30 found to have age indeterminate in R leg, no DVT noted in L leg. Pharmacy asked to start IV heparin for DVT 4 hrs after OR.  Procedure finished 2003 PM.  Heparin level 0.21 is subtherapeutic on heparin 600 units/hr. Spoke with RN and patient ripped out IV this morning at an unknown time but likely around the time the level was drawn. Per RN no bleeding noted except from IV site when removed by patient. Hgb 7.9. Plt wnl.   Goal of Therapy:  Heparin level 0.3-0.7 units/ml Monitor platelets by anticoagulation  protocol: Yes   Plan:  Resume heparin at 600 units/hr when IV access obtained - dosing cautiously due to advanced age and recent surgery Obtain heparin level 8 hours after heparin resumed - RN to call pharmacy when heparin resumed.  Monitor heparin level, CBC and s/s of bleeding daily  Follow up transition to oral anticoagulant   Cristela Felt, PharmD Clinical Pharmacist   01/28/2021 9:28 AM   Addendum: IV access obtained at 1030. Heparin will start at 1100 when remdesivir is finished (30 minute infusion) - due to unknown compatibility with heparin and remdesivir and only 1 PIV.   Plan: - RN to resume heparin 600 units/hr at 1100 - Heparin level at 1900

## 2021-01-28 NOTE — Progress Notes (Signed)
Spoke with patients daughter on phone, updated. Daughter stated patient is allergic to morphine, sensitive to Ativan and similar drugs.  Physician notified, allergies updated.

## 2021-01-28 NOTE — Progress Notes (Deleted)
[facility] for IV heparin Indication: DVT  Allergies  Allergen Reactions  . Morphine And Related Other (See Comments)    Blisters at site and along vein  . Ativan [Lorazepam] Anxiety    Patient Measurements: Height: 5\' 1"  (154.9 cm) Weight: 63.5 kg (140 lb) IBW/kg (Calculated) : 47.8 Heparin Dosing Weight: 63.5 kg  Vital Signs: Temp: 99.5 F (37.5 C) (01/31 2000) Temp Source: Oral (01/31 2000) BP: 95/69 (01/31 1500) Pulse Rate: 80 (01/31 2000)  Labs: Recent Labs    01/26/21 1400 01/26/21 1437 01/26/21 1437 01/27/21 0231 01/28/21 0839 01/28/21 2000  HGB  --  9.6*   < > 9.1* 7.9*  --   HCT  --  31.1*  --  29.2* 24.5*  --   PLT  --  242  --  249 232  --   LABPROT 13.4  --   --   --   --   --   INR 1.1  --   --   --   --   --   HEPARINUNFRC  --   --   --   --  0.21* 0.32  CREATININE 0.54  --   --  0.65 0.62  --    < > = values in this interval not displayed.    Estimated Creatinine Clearance: 34.3 mL/min (by C-G formula based on SCr of 0.62 mg/dL).   Medical History: Past Medical History:  Diagnosis Date  . Colon cancer ([place])   . COVID-19   . DNR (do not resuscitate)   . Dyslipidemia   . History of CVA (cerebrovascular accident)     Medications:  Infusions:  . heparin 600 Units/hr (01/28/21 1140)    Assessment: 87 yo female admitted with R hip fracture. S/p OR for IM nail on 1/30. Doppler obtained 1/30 found to have age indeterminate in R leg, no DVT noted in L leg. Pharmacy asked to start IV heparin for DVT 4 hrs after OR.  Procedure finished 2003 PM.  Heparin level 0.32 is therapeutic on heparin 600 units/hr. Hgb 7.9. Plt wnl.   Goal of Therapy:  Heparin level 0.3-0.7 units/ml Monitor platelets by anticoagulation protocol: Yes   Plan:  Continue heparin at 600 units/hr Monitor heparin level, CBC and s/s of bleeding daily  Follow up transition to oral anticoagulant   Alanda Slim, PharmD, [facility] Clinical  Pharmacist Please see AMION for all Pharmacists' Contact Phone Numbers 01/28/2021, 9:52 PM     Addendum: IV access obtained at 1030. Heparin will start at 1100 when remdesivir is finished (30 minute infusion) - due to unknown compatibility with heparin and remdesivir and only 1 PIV.   Plan: - RN to resume heparin 600 units/hr at 1100 - Heparin level at 1900

## 2021-01-29 LAB — MAGNESIUM: Magnesium: 2 mg/dL (ref 1.7–2.4)

## 2021-01-29 LAB — CBC
HCT: 20.9 % — ABNORMAL LOW (ref 36.0–46.0)
Hemoglobin: 6.8 g/dL — CL (ref 12.0–15.0)
MCH: 30.9 pg (ref 26.0–34.0)
MCHC: 32.5 g/dL (ref 30.0–36.0)
MCV: 95 fL (ref 80.0–100.0)
Platelets: 207 10*3/uL (ref 150–400)
RBC: 2.2 MIL/uL — ABNORMAL LOW (ref 3.87–5.11)
RDW: 14 % (ref 11.5–15.5)
WBC: 11.9 10*3/uL — ABNORMAL HIGH (ref 4.0–10.5)
nRBC: 0 % (ref 0.0–0.2)

## 2021-01-29 LAB — PREPARE RBC (CROSSMATCH)

## 2021-01-29 LAB — BASIC METABOLIC PANEL
Anion gap: 8 (ref 5–15)
BUN: 29 mg/dL — ABNORMAL HIGH (ref 8–23)
CO2: 28 mmol/L (ref 22–32)
Calcium: 8.1 mg/dL — ABNORMAL LOW (ref 8.9–10.3)
Chloride: 100 mmol/L (ref 98–111)
Creatinine, Ser: 0.51 mg/dL (ref 0.44–1.00)
GFR, Estimated: >60 mL/min (ref >60)
Glucose, Bld: 126 mg/dL — ABNORMAL HIGH (ref 70–99)
Potassium: 4.1 mmol/L (ref 3.5–5.1)
Sodium: 136 mmol/L (ref 135–145)

## 2021-01-29 LAB — HEMOGLOBIN AND HEMATOCRIT, BLOOD
HCT: 26.2 % — ABNORMAL LOW (ref 36.0–46.0)
Hemoglobin: 8.7 g/dL — ABNORMAL LOW (ref 12.0–15.0)

## 2021-01-29 LAB — C-REACTIVE PROTEIN: CRP: 12.9 mg/dL — ABNORMAL HIGH (ref <1.0)

## 2021-01-29 LAB — HEPARIN LEVEL (UNFRACTIONATED): Heparin Unfractionated: 0.39 IU/mL (ref 0.30–0.70)

## 2021-01-29 LAB — D-DIMER, QUANTITATIVE: D-Dimer, Quant: 1.88 ug/mL-FEU — ABNORMAL HIGH (ref 0.00–0.50)

## 2021-01-29 MED ORDER — APIXABAN 5 MG PO TABS: 5.0000 mg | ORAL_TABLET | Freq: Two times a day (BID) | ORAL | Status: DC

## 2021-01-29 MED ORDER — FUROSEMIDE 10 MG/ML IJ SOLN
20.0000 mg | Freq: Once | INTRAMUSCULAR | Status: AC
  Administered 2021-01-29: 20 mg via INTRAVENOUS
  Filled 2021-01-29: qty 2

## 2021-01-29 MED ORDER — APIXABAN 5 MG PO TABS
10.0000 mg | ORAL_TABLET | Freq: Two times a day (BID) | ORAL | Status: DC
  Administered 2021-01-29 – 2021-01-30 (×3): 10 mg via ORAL
  Filled 2021-01-29 (×3): qty 2

## 2021-01-29 MED ORDER — SODIUM CHLORIDE 0.9% IV SOLUTION: Freq: Once | INTRAVENOUS | Status: AC

## 2021-01-29 MED ORDER — ADULT MULTIVITAMIN W/MINERALS CH
1.0000 | ORAL_TABLET | Freq: Every day | ORAL | Status: DC
  Administered 2021-01-29 – 2021-01-30 (×2): 1 via ORAL
  Filled 2021-01-29 (×2): qty 1

## 2021-01-29 MED ORDER — ENSURE ENLIVE PO LIQD
237.0000 mL | Freq: Three times a day (TID) | ORAL | Status: DC
  Administered 2021-01-29 – 2021-01-30 (×2): 237 mL via ORAL

## 2021-01-29 MED ORDER — HYDROCODONE-ACETAMINOPHEN 5-325 MG PO TABS: 1.0000 | ORAL_TABLET | Freq: Four times a day (QID) | ORAL | 0 refills | Status: DC | PRN

## 2021-01-29 MED ORDER — SODIUM CHLORIDE 0.9% IV SOLUTION: Freq: Once | INTRAVENOUS | Status: DC

## 2021-01-29 MED ORDER — DIPHENHYDRAMINE HCL 50 MG/ML IJ SOLN: 25.0000 mg | Freq: Four times a day (QID) | INTRAMUSCULAR | Status: DC | PRN

## 2021-01-29 NOTE — Progress Notes (Signed)
CRITICAL VALUE ALERT  Critical Value:  Hemoglobin 6.8  Date & Time Notied:  01/29/21 at [place]  Provider Notified: On call through [facility]  Orders Received/Actions taken: order to transfuse 1 unit PRBC

## 2021-01-29 NOTE — Progress Notes (Signed)
Blood bank just called.  Blood is ready for pick up.  Will have day shift to follow up and transfuse.

## 2021-01-29 NOTE — Progress Notes (Signed)
Physical Therapy Treatment Patient Details Name: [name] Barker MRN: [id number] DOB: 11/02/1921 Today's Date: 01/29/2021    History of Present Illness [name] Barker is a 87 y.o. female with medical history significant of CVA, on Plavix; colon cancer; moderate vascular dementia; and HLD presenting with a fall that happened in her bathroom witnessed by the daughter.  Subsequently complained of right hip pain was brought to the ER where she was found to have right hip fracture and admitted; now s/p ORIF, TWB; found to be covid positive    PT Comments    Continuing work on functional mobility and activity tolerance;  Seen in conjunction with OT with the goals of assessing OOB to recliner transfers, getting more information about pt's living situation and available assist, and considering viability of dc home;   Performed basic stand pivot transfer to recliner with 2 person Total assist (details in transfer section below);   Pt's daughter, [name] Barker very much wants to have pt at home; Please see OT note of this date for updated info re: home setup, PLOF; [name] Barker is available to provide assist around the clock; At her current functional status, [name] Barker needs 24 hour Max/total assist, ideally 2 person assist; will need to be at [facility] functional level; I'm concerned that this level of assist may be unsustainable for one person at home  Follow Up Recommendations  SNF; worth considering short-term rehab at SNF level to maximize independence and safety with mobility and ADLs prior to dc home;  Still, noting [name]'s desire to bring her mother home -- if for home, recommend maximizing HH services -- HHPT/OT/Aide     Equipment Recommendations  Rolling walker with 5" wheels;3in1 (PT);Wheelchair (measurements PT);Wheelchair cushion (measurements PT);Other (comment) (if for home consider hospital bed, drop-arm BSC)    Recommendations for Other Services       Precautions / Restrictions  Precautions Precautions: Fall Restrictions Weight Bearing Restrictions: Yes RLE Weight Bearing: Touchdown weight bearing Other Position/Activity Restrictions: pt unable to maintain TWB, hx of dementia/cognitive deficits    Mobility  Bed Mobility Overal bed mobility: Needs Assistance Bed Mobility: Supine to Sit;Sit to Supine     Supine to sit: Total assist;+2 for physical assistance Sit to supine: Max assist;+2 for physical assistance;+2 for safety/equipment   General bed mobility comments: Required extensive assist for all aspect of bed mobility  Transfers Overall transfer level: Needs assistance Equipment used: 2 person hand held assist Transfers: Stand Pivot Transfers   Stand pivot transfers: +2 physical assistance;Total assist Squat pivot transfers: +2 physical assistance;Max assist     General transfer comment: Performed stand pivot trnsfer towards pt's less painful L side with +2 tot assist; Required Total assist to initiate and power up to stand on LLE; Unable to keep weight off of LLE; Needed L knee guard/block due to buckling  Ambulation/Gait                 Stairs             Wheelchair Mobility    Modified Rankin (Stroke Patients Only)       Balance Overall balance assessment: Needs assistance Sitting-balance support: Feet supported Sitting balance-Leahy Scale: Fair       Standing balance-Leahy Scale: Zero                              Cognition Arousal/Alertness: Awake/alert Behavior During Therapy: WFL for tasks assessed/performed Overall Cognitive Status: No family/caregiver  present to determine baseline cognitive functioning                                 General Comments: Pt awake and participating as much as she could during the session; noting her difficulty understanding, made even harder with masks and face sheilds      Exercises      General Comments General comments (skin integrity, edema,  etc.): Serial BPs during session as follows:   01/29/21 1300 01/29/21 1355 01/29/21 1358  Orthostatic Lying   BP- Lying 105/70  --   --   Pulse- Lying 80  --   --   Orthostatic Sitting  BP- Sitting 123/70 (!) 79/26 (feet down in recliner, just after stand pivot transfer) 101/65 (feet up and reclined)  Pulse- Sitting 73 77 74        Pertinent Vitals/Pain Pain Assessment: Faces Faces Pain Scale: Hurts even more Pain Location: R LE with movement Pain Descriptors / Indicators: Grimacing;Guarding Pain Intervention(s): Limited activity within patient's tolerance;Monitored during session;Repositioned    Home Living Family/patient expects to be discharged to:: Private residence Living Arrangements: Children Available Help at Discharge: Family;Available 24 hours/day;Other (Comment) (Dtr is a retired NP and cared for pt and 33 y/o father (pt's husband)) Type of Home: Apartment Home Access: Ramped entrance   [place]: One level Home Equipment: [place] - 4 wheels;Shower seat;Grab bars - toilet;Wheelchair - manual;Transport chair;Bedside commode Additional Comments: There is a type of monitor pad beside pt's bed that notifies daughter when she gets OOB    Prior Function Level of Independence: Needs assistance  Gait / Transfers Assistance Needed: Needs one-person assist for sit<>stand transfersWalks housegold/room-to-room distances with rollator RW; typically uses a transport chair for community access, DR appts ADL's / Homemaking Assistance Needed: Daughter's assist for bathing and LB dressing; feeds self without difficulty Comments: PT spoke with pt's dauhgter by phone for PLOF info 01/29/21   PT Goals (current goals can now be found in the care plan section) Acute Rehab PT Goals Patient Stated Goal: Did not state PT Goal Formulation: Patient unable to participate in goal setting Time For Goal Achievement: 02/10/21 Potential to Achieve Goals: Fair Progress towards PT goals: Progressing  toward goals (slowly)    Frequency    Min 2X/week      PT Plan Current plan remains appropriate    Co-evaluation PT/OT/SLP Co-Evaluation/Treatment: Yes Reason for Co-Treatment: For patient/therapist safety;To address functional/ADL transfers;Other (comment) (for interdisciplinary consideration of dc planning) PT goals addressed during session: Mobility/safety with mobility        AM-PAC PT "6 Clicks" Mobility   Outcome Measure  Help needed turning from your back to your side while in a flat bed without using bedrails?: A Lot Help needed moving from lying on your back to sitting on the side of a flat bed without using bedrails?: A Lot Help needed moving to and from a bed to a chair (including a wheelchair)?: A Lot Help needed standing up from a chair using your arms (e.g., wheelchair or bedside chair)?: A Lot Help needed to walk in hospital room?: Total Help needed climbing 3-5 steps with a railing? : Total 6 Click Score: 10    End of Session Equipment Utilized During Treatment: Gait belt;Oxygen Activity Tolerance: Patient limited by pain Patient left: in chair;with call bell/phone within reach;Other (comment) (feet elevated, eating lunch) Nurse Communication: Mobility status PT Visit Diagnosis: Unsteadiness on feet (R26.81);Other  abnormalities of gait and mobility (R26.89);Muscle weakness (generalized) (M62.81);Pain Pain - Right/Left: Right Pain - part of body: Leg     Time: 1336-1410 PT Time Calculation (min) (ACUTE ONLY): 34 min  Charges:  $Therapeutic Activity: 8-22 mins                     Roney Marion, PT  Acute Rehabilitation Services Pager [phone] Office [place] 01/29/2021, 4:33 PM

## 2021-01-29 NOTE — Progress Notes (Signed)
     Subjective: 2 Days Post-Op Procedure(s) (LRB): INTRAMEDULLARY (IM) NAIL INTERTROCHANTRIC (Right)   She seems to be pleasantly confused.  Patient not expressing any significant pain.  Resting comfortably in bed.      Objective:   VITALS:   Vitals:   01/28/21 2000 01/29/21 0400  BP:    Pulse: 80   Resp:    Temp: 99.5 F (37.5 C) 98.6 F (37 C)  SpO2:  96%    Dorsiflexion/Plantar flexion intact Incision: dressing C/D/I No cellulitis present Compartment soft  LABS Recent Labs    01/27/21 0231 01/28/21 0839 01/29/21 0246  HGB 9.1* 7.9* 6.8*  HCT 29.2* 24.5* 20.9*  WBC 8.7 8.9 11.9*  PLT 249 232 207    Recent Labs    01/27/21 0231 01/28/21 0839 01/29/21 0246  NA 137 139 136  K 3.3* 3.8 4.1  BUN 20 25* 29*  CREATININE 0.65 0.62 0.51  GLUCOSE 145* 135* 126*     Assessment/Plan: 2 Days Post-Op Procedure(s) (LRB): INTRAMEDULLARY (IM) NAIL INTERTROCHANTRIC (Right) Up with therapy Discharge disposition TBD   Ortho recommendations:  Anticoagulation per medine, Ortho plan was to resume Plavix post-op   Norco for pain management (Rx written).  MiraLax and Colace for constipation  Iron 325 mg tid for 2-3 weeks   TDWB on the right leg.  Dressing to remain in place until follow in clinic in 2 weeks.  Dressing is waterproof and may shower with it in place.  Follow up in 2 weeks at [facility]. Follow up with OLIN,Torie Towle D in 2 weeks.  Contact information:  [facility] [address], [address] [place] [phone]          [name] PA-C  [facility]  [facility] [address], [place], [place], [place] [address] Phone: [phone] [email or web address] Facebook  [employer]

## 2021-01-29 NOTE — Progress Notes (Addendum)
[place] for IV heparin Indication: DVT  Allergies  Allergen Reactions   Morphine And Related Other (See Comments)    Blisters at site and along vein   Ativan [Lorazepam] Anxiety    Patient Measurements: Height: 5\' 1"  (154.9 cm) Weight: 63.5 kg (140 lb) IBW/kg (Calculated) : 47.8 Heparin Dosing Weight: 63.5 kg  Vital Signs: Temp: 98.6 F (37 C) (02/01 0400) Temp Source: Oral (02/01 0400) Pulse Rate: 80 (01/31 2000)  Labs: Recent Labs    01/26/21 1400 01/26/21 1437 01/27/21 0231 01/28/21 0839 01/28/21 2000 01/29/21 0246  HGB  --    < > 9.1* 7.9*  --  6.8*  HCT  --    < > 29.2* 24.5*  --  20.9*  PLT  --    < > 249 232  --  207  LABPROT 13.4  --   --   --   --   --   INR 1.1  --   --   --   --   --   HEPARINUNFRC  --   --   --  0.21* 0.32 0.39  CREATININE 0.54  --  0.65 0.62  --  0.51   < > = values in this interval not displayed.    Estimated Creatinine Clearance: 34.3 mL/min (by C-G formula based on SCr of 0.51 mg/dL).   Medical History: Past Medical History:  Diagnosis Date   Colon cancer ([place])    COVID-19    DNR (do not resuscitate)    Dyslipidemia    History of CVA (cerebrovascular accident)     Medications:  Infusions:   heparin 600 Units/hr (01/28/21 1140)    Assessment: 87 yo female admitted with R hip fracture. S/p OR for IM nail on 1/30. Doppler obtained 1/30 found to have age indeterminate in R leg, no DVT noted in L leg. Pharmacy asked to start IV heparin for DVT 4 hrs after OR.  Procedure finished 2003 PM.  Confirmatory heparin level 0.39 is therapeutic on heparin 600 units/hr. No bleeding noted. Hgb 6.8 - transfusing 1 unit. Plt wnl.   Goal of Therapy:  Heparin level 0.3-0.7 units/ml Monitor platelets by anticoagulation protocol: Yes   Plan:  Continue heparin at 600 units/hr- dosing cautiously due to advanced age and recent surgery Monitor heparin level, CBC and s/s of bleeding daily   Follow up transition to apixaban  Case management consulted for cost of apixaban  Cristela Felt, PharmD Clinical Pharmacist   01/29/2021 7:13 AM   Addendum: Pharmacy consulted to transition heparin to apixaban for DVT.   Plan: - Start apixaban 10mg  twice daily for 7 day followed by 5mg  twice daily  - Stop heparin infusion when first dose of apixaban administered - RN aware - Pharmacy to provide education

## 2021-01-29 NOTE — TOC Initial Note (Signed)
Transition of Care [facility]) - Initial/Assessment Note    Patient Details  Name: [name] Barker MRN: [id number] Date of Birth: [birth year]  Transition of [facility]) CM/SW Contact:    Sharin Mons, RN Phone Number: 01/29/2021, 5:16 PM  Clinical Narrative:           Present with right hip fracture, s/p ORIF  01/27/2021 NCM spoke with daughter regarding d/c plan. Declines SNF placement for mom. Agreeable to [facility] services. Choice offered. [facility] selected. Referral made with [facility]... acceptance pending.  TOC  FOLLOWING AND WILL ASSIST WITH toc needs....  Expected Discharge Plan: [place] (decline SNF placement) Barriers to Discharge: Continued Medical Work up   Patient Goals and CMS Choice        Expected Discharge Plan and Services Expected Discharge Plan: [place] (decline SNF placement)   Discharge Planning Services: CM Consult                               HH Arranged: RN,PT,OT,Nurse's Aide HH Agency: [place] Date [facility] Agency Contacted: 01/29/21 Time [place]: [id number] Representative spoke with at [place]: Tommi Rumps  Prior Living Arrangements/Services   Lives with:: Adult Children   Do you feel safe going back to the place where you live?: Yes               Activities of Daily Living      Permission Sought/Granted                  Emotional Assessment              Admission diagnosis:  Fall, initial encounter [W19.XXXA] Closed displaced intertrochanteric fracture of right femur, initial encounter ([place]) [S72.141A] Closed right hip fracture, initial encounter [facility]) [S72.001A] Patient Active Problem List   Diagnosis Date Noted  . Closed right hip fracture, initial encounter ([place]) 01/26/2021  . History of CVA (cerebrovascular accident)   . COVID-19   . DNR (do not resuscitate)    PCP:  Hoyt Koch, MD Pharmacy:   [facility] [address], [place] - [address] [place] [place] [address] Phone: [phone] Fax: [place], [place] [address] [place] [address] Phone: [phone] Fax: [phone]     Social Determinants of Health (SDOH) Interventions    Readmission Risk Interventions No flowsheet data found.

## 2021-01-29 NOTE — Evaluation (Addendum)
Occupational Therapy Evaluation Patient Details Name: [name] Barker MRN: [id number] DOB: 02/13/1921 Today's Date: 01/29/2021    History of Present Illness [name] Barker is a 87 y.o. female with medical history significant of CVA, on Plavix; colon cancer; moderate vascular dementia; and HLD presenting with a fall that happened in her bathroom witnessed by the daughter.  Subsequently complained of right hip pain was brought to the ER where she was found to have right hip fracture and admitted; now s/p ORIF, TWB; found to be covid positive   Clinical Impression   Pt presents with decline in function and safety with ADLs and ADL mobility with impaired strength, balance, endurance and hx of dementia/cognitive impairments. PLOF info provided by pt;s daughter with phone call. Pt's dtr is a retired NP and cared for pt and pt's 66 y/o husband at home. Pt required assist with bathing and LB dressing at baseline and uses a rollater for household distances. Pt currently requires total A +2 for bed mobility to sit OEB, max A +2 for SPTs, mod A with UB dressing, total A with LB ADLs and toileting and set yo for self feeding. Pt would benefit from acute OT services to address impairments to maximize level of function and safety BPs during session as follows:  Supine 105/70 Sitting 79/26 Seated in recliner with LEs elevated 101/65      Follow Up Recommendations  SNF;Supervision/Assistance - 24 hour (pt's daughter would like to take pt home, uncertain if dtr can provide pt's current level of care required at this time)   Equipment Recommendations   Rolling walker with 5" wheels;3in1;Wheelchair (measurements OT);Wheelchair cushion (measurements OT);Other  (if for home consider hospital bed, drop-arm BSC)     Recommendations for Other Services       Precautions / Restrictions Precautions Precautions: Fall Restrictions Weight Bearing Restrictions: Yes RLE Weight Bearing: Touchdown weight  bearing Other Position/Activity Restrictions: pt unable to maintain TWB, hx of dementia/cognitive deficits      Mobility Bed Mobility Overal bed mobility: Needs Assistance Bed Mobility: Supine to Sit;Sit to Supine     Supine to sit: Total assist;+2 for physical assistance Sit to supine: Max assist;+2 for physical assistance;+2 for safety/equipment   General bed mobility comments: Required extensive assist for all aspect of bed mobility    Transfers Overall transfer level: Needs assistance Equipment used: 2 person hand held assist Transfers: Stand Pivot Transfers   Stand pivot transfers: +2 physical assistance;Total assist Squat pivot transfers: +2 physical assistance;Max assist     General transfer comment: Required Total assist to initiate and power up to stand on LLE; Unable to keep weight off of LLE; Needed L knee guard/block due to buckling    Balance Overall balance assessment: Needs assistance Sitting-balance support: Feet supported Sitting balance-Leahy Scale: Fair       Standing balance-Leahy Scale: Zero                             ADL either performed or assessed with clinical judgement   ADL Overall ADL's : Needs assistance/impaired Eating/Feeding: Set up;Sitting   Grooming: Wash/dry hands;Wash/dry face;Min guard;Sitting     Upper Body Bathing Details (indicate cue type and reason): dtr assisted pt at baseline   Lower Body Bathing Details (indicate cue type and reason): dtr assisted pt at baseline Upper Body Dressing : Moderate assistance;Sitting   Lower Body Dressing: Total assistance   Toilet Transfer: Maximal assistance;+2 for physical assistance;Cueing for  sequencing;Stand-pivot   Toileting- Clothing Manipulation and Hygiene: Total assistance       Functional mobility during ADLs: Maximal assistance;+2 for physical assistance;Cueing for sequencing General ADL Comments: Pt with hx of dementia and has asssist from her dtr at baseline  for bathing and LB dressing. Pt requires multimodal cues for initiation of ADL tasks due to cognitive impairments     Vision Baseline Vision/History:  (unable to properly assess due to pt's cognition)       Perception     Praxis      Pertinent Vitals/Pain Pain Assessment: Faces Faces Pain Scale: Hurts even more Pain Location: R LE with movement Pain Descriptors / Indicators: Grimacing;Guarding Pain Intervention(s): Limited activity within patient's tolerance;Monitored during session;Repositioned     Hand Dominance Right   Extremity/Trunk Assessment Upper Extremity Assessment Upper Extremity Assessment: Generalized weakness   Lower Extremity Assessment Lower Extremity Assessment: Defer to PT evaluation       Communication Communication Communication: HOH   Cognition Arousal/Alertness: Awake/alert Behavior During Therapy: WFL for tasks assessed/performed Overall Cognitive Status: No family/caregiver present to determine baseline cognitive functioning                                    General Comments  Pt pleasantly confused, mostly incoherent speech (jibberish)  Exercises     Shoulder Instructions      Home Living Family/patient expects to be discharged to:: Private residence Living Arrangements: Children Available Help at Discharge: Family;Available 24 hours/day;Other (Comment) (Dtr is a retired NP and cared for pt and 68 y/o father (pt's husband)) Type of Home: Apartment Home Access: Ramped entrance     Home Layout: One level     Bathroom Shower/Tub: Walk-in Human resources officer: Handicapped height     Home Equipment: Environmental consultant - 4 wheels;Shower seat;Grab bars - toilet;Wheelchair - manual;Transport chair;Bedside commode   Additional Comments: There is a type of monitor pad beside pt's bed that notifies daughter when she gets OOB      Prior Functioning/Environment Level of Independence: Needs assistance  Gait / Transfers Assistance  Needed: Needs one-person assist for sit<>stand transfersWalks housegold/room-to-room distances with rollator RW; typically uses a transport chair for community access, DR appts ADL's / Homemaking Assistance Needed: Daughter's assist for bathing and LB dressing; feeds self without difficulty Communication / Swallowing Assistance Needed: HOH Comments: PT spoke with pt's dauhgter by phone for PLOF info 01/29/21        OT Problem List: Decreased strength;Impaired balance (sitting and/or standing);Decreased cognition;Decreased knowledge of precautions;Pain;Decreased safety awareness;Decreased coordination;Decreased activity tolerance;Decreased knowledge of use of DME or AE      OT Treatment/Interventions: Self-care/ADL training;Patient/family education;Therapeutic activities;DME and/or AE instruction;Balance training    OT Goals(Current goals can be found in the care plan section) Acute Rehab OT Goals Patient Stated Goal: Did not state OT Goal Formulation: With patient/family Time For Goal Achievement: 02/12/21 Potential to Achieve Goals: Good ADL Goals Pt Will Perform Grooming: with supervision;with set-up;sitting Pt Will Perform Upper Body Dressing: with min assist;sitting Pt Will Transfer to Toilet: with max assist;with mod assist;stand pivot transfer;bedside commode Additional ADL Goal #1: Pt will complete bed mobility mod A to sit EOB in prep for toilet transfers  OT Frequency: Min 2X/week   Barriers to D/C:            Co-evaluation   Reason for Co-Treatment: For patient/therapist safety;To address functional/ADL transfers;Other (comment) (for interdisciplinary consideration  of dc planning) PT goals addressed during session: Mobility/safety with mobility        AM-PAC OT "6 Clicks" Daily Activity     Outcome Measure Help from another person eating meals?: A Little Help from another person taking care of personal grooming?: A Little Help from another person toileting, which  includes using toliet, bedpan, or urinal?: Total Help from another person bathing (including washing, rinsing, drying)?: Total Help from another person to put on and taking off regular upper body clothing?: A Little Help from another person to put on and taking off regular lower body clothing?: Total 6 Click Score: 12   End of Session Equipment Utilized During Treatment: Gait belt Nurse Communication: Mobility status  Activity Tolerance: Patient tolerated treatment well Patient left: in chair;with call bell/phone within reach;with chair alarm set  OT Visit Diagnosis: Unsteadiness on feet (R26.81);Other abnormalities of gait and mobility (R26.89);Muscle weakness (generalized) (M62.81);History of falling (Z91.81);Other symptoms and signs involving cognitive function;Cognitive communication deficit (R41.841);Pain Pain - Right/Left: Right Pain - part of body: Leg;Hip                Time: 1336-1410 OT Time Calculation (min): 34 min Charges:  OT General Charges $OT Visit: 1 Visit OT Evaluation $OT Eval Moderate Complexity: 1 Mod    Britt Bottom 01/29/2021, 4:24 PM

## 2021-01-29 NOTE — Progress Notes (Signed)
Nutrition Follow-up  DOCUMENTATION CODES:   Not applicable  INTERVENTION:   -Ensure Enlive po TID, each supplement provides 350 kcal and 20 grams of protein -MVI with minerals daily -Feeding assistance with meals  NUTRITION DIAGNOSIS:   Increased nutrient needs related to post-op healing as evidenced by estimated needs.  Ongoing  GOAL:   Patient will meet greater than or equal to 90% of their needs  Progressing  MONITOR:   PO intake,Supplement acceptance,Weight trends,Labs,Skin,I & O's  REASON FOR ASSESSMENT:   Consult Assessment of nutrition requirement/status,Hip fracture protocol  ASSESSMENT:   [name] Barker is a 87 y.o. female with medical history significant of CVA, on Plavix; colon cancer; moderate vascular dementia; and HLD presenting with a fall.  She was in the bathroom and daughter helped her stand.  Her daughter left her there and she was sitting on her bottom in front of the sink.  Uncertain if she hit her head.  They assisted her onto the commode and wheelchair and laid her down.  Complained of mild hip pain - daughter noted R leg shorter than L.  1/30- s/p Procedure(s): INTRAMEDULLARY (IM) NAIL INTERTROCHANTRIC (Right) [id number]  Reviewed I/O's: -84 ml x 24 hours and 70 ml since admission  UOP: 700 ml x 24 hours  Pt pleasantly confused and unable to provide history. She kept repeating "there's so much do to".   Noted pt with minimal intake. Meal completion 25%.   Medications reviewed and include vitamin C and zinc sulfate.   Labs reviewed.   Nutrition-Focused Physical Exam:   Flowsheet Row Most Recent Value  Orbital Region No depletion  Upper Arm Region Moderate depletion  Thoracic and Lumbar Region No depletion  Buccal Region Mild depletion  Temple Region Mild depletion  Clavicle Bone Region No depletion  Clavicle and Acromion Bone Region No depletion  Scapular Bone Region No depletion  Dorsal Hand Moderate depletion  Patellar Region  Mild depletion  Anterior Thigh Region Mild depletion  Posterior Calf Region Mild depletion  Edema (RD Assessment) Mild  Hair Reviewed  Eyes Reviewed  Mouth Reviewed  Nails Reviewed      Diet Order:   Diet Order            Diet regular Room service appropriate? No; Fluid consistency: Thin  Diet effective now                 EDUCATION NEEDS:   No education needs have been identified at this time  Skin:  Skin Assessment: Skin Integrity Issues: Skin Integrity Issues:: Incisions Incisions: closed rt hip  Last BM:  Unknown  Height:   Ht Readings from Last 1 Encounters:  01/26/21 5\' 1"  (1.549 m)    Weight:   Wt Readings from Last 1 Encounters:  01/26/21 63.5 kg    Ideal Body Weight:  47.7 kg  BMI:  Body mass index is 26.45 kg/m.  Estimated Nutritional Needs:   Kcal:  1600-1800  Protein:  80-95 grams  Fluid:  > 1.6 L    Loistine Chance, RD, LDN, [place] Registered Dietitian II Certified Diabetes Care and Education Specialist Please refer to [facility] for RD and/or RD on-call/weekend/after hours pager

## 2021-01-29 NOTE — Plan of Care (Signed)
  Problem: Activity: Goal: Risk for activity intolerance will decrease Outcome: Progressing   Problem: Nutrition: Goal: Adequate nutrition will be maintained Outcome: Progressing   Problem: Education: Goal: Knowledge of General Education information will improve Description: Including pain rating scale, medication(s)/side effects and non-pharmacologic comfort measures Outcome: Not Progressing   Problem: Health Behavior/Discharge Planning: Goal: Ability to manage health-related needs will improve Outcome: Not Progressing   Problem: Pain Managment: Goal: General experience of comfort will improve Outcome: Not Progressing   Problem: Safety: Goal: Ability to remain free from injury will improve Outcome: Not Progressing

## 2021-01-29 NOTE — Progress Notes (Signed)
PROGRESS NOTE                                                                                                                                                                                                             Patient Demographics:    [name] Barker, is a 87 y.o. female, DOB - [birth year], MVH:[id number]  Outpatient Primary MD for the patient is Hoyt Koch, MD    LOS - 3  Admit date - 01/26/2021    Chief Complaint  Patient presents with  . Fall       Brief Narrative (HPI from H&P) - [name] Barker is a 87 y.o. female with medical history significant of CVA, on Plavix; colon cancer; moderate vascular dementia; and HLD presenting with a fall that happened in her bathroom witnessed by the daughter.  Subsequently complained of right hip pain was brought to the ER where she was found to have right hip fracture and admitted.   Subjective:    Ajaya Crutchfield today in bed, pleasantly confused and appears to be in no distress, unable to answer questions reliably.   Assessment  & Plan :     1.  Mechanical fall with right hip fracture. Post ORIF by Dr. Alvan Dame on 01/27/2021, seems to have tolerated the procedure well, she does have mild perioperative blood loss related anemia.  Has been typed crossed, since she has DVT in her right leg also which clinically appears to be subacute she will receive Eliquis for DVT treatment, toe-touch bearing in the right lower extremity per orthopedics.  Daughter would like to take her home with home health PT, if everything goes well will try home discharge on Wednesday.   2. Acute COVID-19 infection-most likely incidental, stable chest x-ray with no hypoxia.  3 days of remdesivir, since CRP is quite elevated now we will give her a short course of Decadron as well.  No hypoxia so far.  Encouraged the patient to sit up in chair in the daytime use I-S and flutter valve for  pulmonary toiletry and then prone in bed when at night.  Will advance activity and titrate down oxygen as possible.   Recent Labs  Lab 01/26/21 1400 01/26/21 1437 01/26/21 1446 01/26/21 1739 01/26/21 2140 01/27/21 0231 01/28/21 0839 01/29/21 0246  WBC  --  6.3  --   --   --  8.7 8.9 11.9*  HGB  --  9.6*  --   --   --  9.1* 7.9* 6.8*  HCT  --  31.1*  --   --   --  29.2* 24.5* 20.9*  PLT  --  242  --   --   --  249 232 207  CRP  --   --   --  2.2*  --  4.4* 12.1* 12.9*  DDIMER  --   --   --  >20.00*  --  14.43* 3.39* 1.88*  PROCALCITON  --   --   --   --  <0.10  --   --   --   AST  --   --   --   --   --  27 29  --   ALT  --   --   --   --   --  15 14  --   ALKPHOS  --   --   --   --   --  57 43  --   BILITOT  --   --   --   --   --  0.2* 0.4  --   ALBUMIN  --   --   --   --   --  3.3* 2.9*  --   INR 1.1  --   --   --   --   --   --   --   SARSCOV2NAA  --   --  POSITIVE*  --   --   --   --   --     3.  History of CVA.  Was on Plavix, now switched to Eliquis for #4 below.  4. R.Leg DVT with ++ D Dimer - clinically acute, Hep gtt >> Eliquis.   5. Underlying history of depression.  On Celexa.  6.  History of moderate vascular dementia.  At risk for delirium, minimize narcotics and benzodiazepines.  If delirious use Haldol instead.  7. Mild periop blood loss related Anaemia - 1 unit PRBC 01/29/21 - monitor, post op site looks fine.      Condition - Extremely Guarded  Family Communication  :  daughter [name] [phone] on 01/27/2021 , 01/28/21, 01/29/21  Code Status :  DNR  Consults  :  Ortho  Procedures  :    CT Head - non acute  ORIF  01/27/2021  Leg ultrasound age indeterminate right lower extremity DVT  PUD Prophylaxis : None  Disposition Plan  :    Status is: Inpatient  Remains inpatient appropriate because:IV treatments appropriate due to intensity of illness or inability to take PO   Dispo: The patient is from: Home              Anticipated d/c is to: Home               Anticipated d/c date is: > 3 days              Patient currently is not medically stable to d/c.   Difficult to place patient No   DVT Prophylaxis  :   Heparin   Lab Results  Component Value Date   PLT 207 01/29/2021    Diet :  Diet Order            Diet regular Room service appropriate? No; Fluid consistency: Thin  Diet effective now  Inpatient Medications  Scheduled Meds: . vitamin C  500 mg Oral Daily  . Chlorhexidine Gluconate Cloth  6 each Topical Daily  . citalopram  10 mg Oral Daily  . dexamethasone (DECADRON) injection  6 mg Intravenous Q24H  . docusate sodium  100 mg Oral BID  . ferrous sulfate  325 mg Oral TID PC  . fluticasone  1 spray Each Nare Daily  . loratadine  10 mg Oral Daily  . mupirocin ointment  1 application Nasal BID  . pantoprazole  40 mg Oral Daily  . zinc sulfate  220 mg Oral Daily   Continuous Infusions: . heparin 600 Units/hr (01/28/21 1140)   PRN Meds:.acetaminophen, albuterol, bisacodyl, chlorpheniramine-HYDROcodone, diphenhydrAMINE, guaiFENesin-dextromethorphan, hydrALAZINE, HYDROcodone-acetaminophen, metoprolol tartrate, [DISCONTINUED] ondansetron **OR** ondansetron (ZOFRAN) IV, polyethylene glycol  Antibiotics  :    Anti-infectives (From admission, onward)   Start     Dose/Rate Route Frequency Ordered Stop   01/28/21 0000  ceFAZolin (ANCEF) IVPB 2g/100 mL premix        2 g 200 mL/hr over 30 Minutes Intravenous Every 6 hours 01/27/21 2147 01/28/21 0609   01/27/21 1000  remdesivir 100 mg in sodium chloride 0.9 % 100 mL IVPB       "Followed by" Linked Group Details   100 mg 200 mL/hr over 30 Minutes Intravenous Daily 01/26/21 1701 01/28/21 1100   01/26/21 1715  remdesivir 200 mg in sodium chloride 0.9% 250 mL IVPB       "Followed by" Linked Group Details   200 mg 580 mL/hr over 30 Minutes Intravenous Once 01/26/21 1701 01/26/21 1817       Time Spent in minutes  30   Lala Lund M.D on 01/29/2021  at 11:01 AM  To page go to [email or web address]   [facility] -  Office  [phone]    See all Orders from today for further details    Objective:   Vitals:   01/29/21 0811 01/29/21 0852 01/29/21 0907 01/29/21 0916  BP: (!) 108/59 (!) 108/59 (!) 96/54 (!) 96/54  Pulse: 68 89 77 77  Resp: 16 16 16 16   Temp: 98 F (36.7 C) 98 F (36.7 C) 97.8 F (36.6 C) 97.8 F (36.6 C)  TempSrc: Oral Oral Oral   SpO2: 95% 92% 94%   Weight:      Height:        Wt Readings from Last 3 Encounters:  01/26/21 63.5 kg     Intake/Output Summary (Last 24 hours) at 01/29/2021 1101 Last data filed at 01/29/2021 0900 Gross per 24 hour  Intake 855.74 ml  Output 500 ml  Net 355.74 ml     Physical Exam  Awake but pleasantly confused, unable to answer questions or follow commands reliably [place].AT,PERRAL Supple Neck,No JVD, No cervical lymphadenopathy appriciated.  Symmetrical Chest wall movement, Good air movement bilaterally, CTAB RRR,No Gallops, Rubs or new Murmurs, No Parasternal Heave +ve B.Sounds, Abd Soft, No tenderness, No organomegaly appriciated, No rebound - guarding or rigidity. No Cyanosis, right hip postop site stable with no appreciable hematoma.     Data Review:    CBC Recent Labs  Lab 01/26/21 1437 01/27/21 0231 01/28/21 0839 01/29/21 0246  WBC 6.3 8.7 8.9 11.9*  HGB 9.6* 9.1* 7.9* 6.8*  HCT 31.1* 29.2* 24.5* 20.9*  PLT 242 249 232 207  MCV 96.0 95.4 95.3 95.0  MCH 29.6 29.7 30.7 30.9  MCHC 30.9 31.2 32.2 32.5  RDW 13.4 13.5 13.8 14.0  LYMPHSABS 1.4  --   --   --  MONOABS 0.5  --   --   --   EOSABS 0.0  --   --   --   BASOSABS 0.0  --   --   --     Recent Labs  Lab 01/26/21 1400 01/26/21 1739 01/26/21 2140 01/27/21 0231 01/28/21 0839 01/29/21 0246  NA 137  --   --  137 139 136  K 4.2  --   --  3.3* 3.8 4.1  CL 99  --   --  98 101 100  CO2 27  --   --  28 28 28   GLUCOSE 129*  --   --  145* 135* 126*  BUN 13  --   --  20 25* 29*  CREATININE 0.54   --   --  0.65 0.62 0.51  CALCIUM 8.6*  --   --  8.5* 8.0* 8.1*  AST  --   --   --  27 29  --   ALT  --   --   --  15 14  --   ALKPHOS  --   --   --  57 43  --   BILITOT  --   --   --  0.2* 0.4  --   ALBUMIN  --   --   --  3.3* 2.9*  --   MG  --   --   --  1.9 1.8 2.0  CRP  --  2.2*  --  4.4* 12.1* 12.9*  DDIMER  --  >20.00*  --  14.43* 3.39* 1.88*  PROCALCITON  --   --  <0.10  --   --   --   INR 1.1  --   --   --   --   --     ------------------------------------------------------------------------------------------------------------------ No results for input(s): CHOL, HDL, LDLCALC, TRIG, CHOLHDL, LDLDIRECT in the last 72 hours.  No results found for: HGBA1C ------------------------------------------------------------------------------------------------------------------ No results for input(s): TSH, T4TOTAL, T3FREE, THYROIDAB in the last 72 hours.  Invalid input(s): FREET3  Cardiac Enzymes No results for input(s): CKMB, TROPONINI, MYOGLOBIN in the last 168 hours.  Invalid input(s): CK ------------------------------------------------------------------------------------------------------------------ No results found for: BNP  Micro Results Recent Results (from the past 240 hour(s))  SARS Coronavirus 2 by RT PCR (hospital order, [facility]) Nasopharyngeal Nasopharyngeal Swab     Status: Abnormal   Collection Time: 01/26/21  2:46 PM   Specimen: Nasopharyngeal Swab  Result Value Ref Range Status   SARS Coronavirus 2 POSITIVE (A) NEGATIVE Final    Comment: RESULT CALLED TO, READ BACK BY AND VERIFIED WITH: M,CARDUFF @1625  01/26/21 EB (NOTE) SARS-CoV-2 target nucleic acids are DETECTED  SARS-CoV-2 RNA is generally detectable in upper respiratory specimens  during the acute phase of infection.  Positive results are indicative  of the presence of the identified virus, but do not rule out bacterial infection or co-infection with other pathogens  not detected by the test.  Clinical correlation with patient history and  other diagnostic information is necessary to determine patient infection status.  The expected result is negative.  Fact Sheet for Patients:   BoilerBrush.com.cyhttps://www.fda.gov/media/136312/download   Fact Sheet for Healthcare Providers:   https://pope.com/https://www.fda.gov/media/136313/download    This test is not yet approved or cleared by the [place] FDA and  has been authorized for detection and/or diagnosis of SARS-CoV-2 by FDA under an Emergency Use Authorization (EUA).  This EUA will remain in effect (meaning this test can b e used) for the  duration of  the COVID-19 declaration under Section 564(b)(1) of the Act, 21 U.S.C. section 360-bbb-3(b)(1), unless the authorization is terminated or revoked sooner.  Performed at [facility], [place] [address] [place], [place] [address]   Surgical PCR screen     Status: None   Collection Time: 01/27/21 11:55 AM   Specimen: Nasal Mucosa; Nasal Swab  Result Value Ref Range Status   MRSA, PCR NEGATIVE NEGATIVE Final   Staphylococcus aureus NEGATIVE NEGATIVE Final    Comment: (NOTE) The Xpert SA Assay (FDA approved for NASAL specimens in patients 82 years of age and older), is one component of a comprehensive surveillance program. It is not intended to diagnose infection nor to guide or monitor treatment. Performed at [facility], [place] [address] [place], [place] [address]     Radiology Reports DG Pelvis 1-2 Views  Result Date: 01/26/2021 CLINICAL DATA:  Fall, visibly shortened RIGHT leg, on blood thinners EXAM: PELVIS - 1-2 VIEW COMPARISON:  Portable exam 1425 hours without priors for comparison FINDINGS: Comminuted displaced and angulated intertrochanteric fracture RIGHT femur. No dislocation. Narrowing of the hip joints bilaterally. Orthopedic hardware at LEFT femoral diaphysis. Pelvis intact with SI joints symmetric. IMPRESSION: Comminuted displaced and angulated  intertrochanteric fracture RIGHT femur. Electronically Signed   By: Lavonia Dana M.D.   On: 01/26/2021 14:55   CT HEAD WO CONTRAST  Result Date: 01/26/2021 CLINICAL DATA:  Head trauma.  Patient on blood thinners EXAM: CT HEAD WITHOUT CONTRAST TECHNIQUE: Contiguous axial images were obtained from the base of the skull through the vertex without intravenous contrast. COMPARISON:  None. FINDINGS: Brain: No acute intracranial hemorrhage. No focal mass lesion. No CT evidence of acute infarction. No midline shift or mass effect. No hydrocephalus. Basilar cisterns are patent. Small lacunar infarction brainstem measures 3 mm on image 12/3). There are periventricular and subcortical white matter hypodensities. Generalized cortical atrophy. Vascular: No hyperdense vessel or unexpected calcification. Skull: Normal. Negative for fracture or focal lesion. Sinuses/Orbits: Paranasal sinuses and mastoid air cells are clear. Orbits are clear. Other: None. IMPRESSION: 1. No acute intracranial findings. 2. Atrophy and white matter microvascular disease. Electronically Signed   By: Suzy Bouchard M.D.   On: 01/26/2021 15:35   DG Chest Port 1 View  Result Date: 01/26/2021 CLINICAL DATA:  Clearance trauma, fall EXAM: PORTABLE CHEST 1 VIEW COMPARISON:  None. FINDINGS: Patient rotated rightward. Enlarged cardiac silhouette. Ectatic aorta. No effusion, infiltrate or pneumothorax. No pulmonary contusion. No pleural fluid. No acute osseous abnormality. IMPRESSION: 1. Cardiomegaly. 2. No acute cardiopulmonary process. Electronically Signed   By: Suzy Bouchard M.D.   On: 01/26/2021 14:55   DG C-Arm 1-60 Min  Result Date: 01/27/2021 CLINICAL DATA:  Right femur intramedullary nail. EXAM: DG C-ARM 1-60 MIN; RIGHT FEMUR 2 VIEWS FLUOROSCOPY TIME:  Fluoroscopy Time:  1 minutes 47 seconds Radiation Exposure Index (if provided by the fluoroscopic device): 14.92 mGy Number of Acquired Spot Images: 3 COMPARISON:  Preoperative radiograph  yesterday. FINDINGS: Three fluoroscopic spot views of the right femur obtained in the operating room. Intramedullary rod with trans trochanteric and distal locking screw traverse intertrochanteric femur fracture. Fracture is in improved alignment compared to preoperative imaging. IMPRESSION: Intraoperative fluoroscopy after ORIF of intertrochanteric femur fracture. Electronically Signed   By: Keith Rake M.D.   On: 01/27/2021 19:56   DG FEMUR, MIN 2 VIEWS RIGHT  Result Date: 01/27/2021 CLINICAL DATA:  Right femur intramedullary nail. EXAM: DG C-ARM 1-60 MIN; RIGHT FEMUR 2 VIEWS  FLUOROSCOPY TIME:  Fluoroscopy Time:  1 minutes 47 seconds Radiation Exposure Index (if provided by the fluoroscopic device): 14.92 mGy Number of Acquired Spot Images: 3 COMPARISON:  Preoperative radiograph yesterday. FINDINGS: Three fluoroscopic spot views of the right femur obtained in the operating room. Intramedullary rod with trans trochanteric and distal locking screw traverse intertrochanteric femur fracture. Fracture is in improved alignment compared to preoperative imaging. IMPRESSION: Intraoperative fluoroscopy after ORIF of intertrochanteric femur fracture. Electronically Signed   By: Keith Rake M.D.   On: 01/27/2021 19:56   DG FEMUR, MIN 2 VIEWS RIGHT  Result Date: 01/26/2021 CLINICAL DATA:  Pain EXAM: RIGHT FEMUR 2 VIEWS COMPARISON:  None. FINDINGS: there is an acute comminuted intratrochanteric fracture of the proximal right femur. There is no dislocation. There are advanced degenerative changes of the right hip. There is no acute displaced fracture involving the distal femur. There are end-stage degenerative changes of the right knee, especially within the lateral compartment. IMPRESSION: 1. Acute comminuted intratrochanteric fracture of the proximal right femur. 2. Advanced degenerative changes of the right hip. 3. End-stage degenerative changes of the right knee. Electronically Signed   By: Constance Holster M.D.   On: 01/26/2021 15:46   VAS [place] LOWER EXTREMITY VENOUS (DVT)  Result Date: 01/27/2021  Lower Venous DVT Study Indications: Covid-19, elevated D-Dimer, hip fracture.  Limitations: Hip fracture, patient confusion. Comparison Study: No prior study Performing Technologist: Sharion Dove RVS  Examination Guidelines: A complete evaluation includes B-mode imaging, spectral Doppler, color Doppler, and power Doppler as needed of all accessible portions of each vessel. Bilateral testing is considered an integral part of a complete examination. Limited examinations for reoccurring indications may be performed as noted. The reflux portion of the exam is performed with the patient in reverse Trendelenburg.  +---------+---------------+---------+-----------+----------+-------------------+ RIGHT    CompressibilityPhasicitySpontaneityPropertiesThrombus Aging      +---------+---------------+---------+-----------+----------+-------------------+ CFV      Full           Yes      Yes                                      +---------+---------------+---------+-----------+----------+-------------------+ SFJ      Full                                                             +---------+---------------+---------+-----------+----------+-------------------+ FV Prox  Full                                                             +---------+---------------+---------+-----------+----------+-------------------+ FV Mid   Full                                                             +---------+---------------+---------+-----------+----------+-------------------+ FV DistalFull                                                             +---------+---------------+---------+-----------+----------+-------------------+  PFV      Full                                                             +---------+---------------+---------+-----------+----------+-------------------+ POP                                                    Not well visualized +---------+---------------+---------+-----------+----------+-------------------+ PTV      None                                                             +---------+---------------+---------+-----------+----------+-------------------+ PERO                                                  Not well visualized +---------+---------------+---------+-----------+----------+-------------------+   +---------+---------------+---------+-----------+----------+-------------------+ LEFT     CompressibilityPhasicitySpontaneityPropertiesThrombus Aging      +---------+---------------+---------+-----------+----------+-------------------+ CFV      Full           Yes      Yes                                      +---------+---------------+---------+-----------+----------+-------------------+ SFJ      Full                                                             +---------+---------------+---------+-----------+----------+-------------------+ FV Prox  Full                                                             +---------+---------------+---------+-----------+----------+-------------------+ FV Mid   Full                                                             +---------+---------------+---------+-----------+----------+-------------------+ FV DistalFull                                                             +---------+---------------+---------+-----------+----------+-------------------+ PFV      Full                                                             +---------+---------------+---------+-----------+----------+-------------------+  POP                     Yes      Yes                  patent by color and                                                       Doppler             +---------+---------------+---------+-----------+----------+-------------------+ PTV      Full                                                              +---------+---------------+---------+-----------+----------+-------------------+ PERO                                                  Not well visualized +---------+---------------+---------+-----------+----------+-------------------+    Summary: RIGHT: - Findings consistent with age indeterminate deep vein thrombosis involving the right posterior tibial veins.  LEFT: - There is no evidence of deep vein thrombosis in the lower extremity. However, portions of this examination were limited- see technologist comments above.  *See table(s) above for measurements and observations. Electronically signed by Jamelle Haring on 01/27/2021 at 5:56:16 PM.    Final

## 2021-01-30 ENCOUNTER — Other Ambulatory Visit (HOSPITAL_COMMUNITY): Payer: Self-pay | Admitting: Internal Medicine

## 2021-01-30 LAB — BASIC METABOLIC PANEL
Anion gap: 11 (ref 5–15)
BUN: 27 mg/dL — ABNORMAL HIGH (ref 8–23)
CO2: 29 mmol/L (ref 22–32)
Calcium: 8.3 mg/dL — ABNORMAL LOW (ref 8.9–10.3)
Chloride: 97 mmol/L — ABNORMAL LOW (ref 98–111)
Creatinine, Ser: 0.53 mg/dL (ref 0.44–1.00)
GFR, Estimated: >60 mL/min (ref >60)
Glucose, Bld: 120 mg/dL — ABNORMAL HIGH (ref 70–99)
Potassium: 3.8 mmol/L (ref 3.5–5.1)
Sodium: 137 mmol/L (ref 135–145)

## 2021-01-30 LAB — CBC
HCT: 27.4 % — ABNORMAL LOW (ref 36.0–46.0)
Hemoglobin: 8.7 g/dL — ABNORMAL LOW (ref 12.0–15.0)
MCH: 30 pg (ref 26.0–34.0)
MCHC: 31.8 g/dL (ref 30.0–36.0)
MCV: 94.5 fL (ref 80.0–100.0)
Platelets: 237 10*3/uL (ref 150–400)
RBC: 2.9 MIL/uL — ABNORMAL LOW (ref 3.87–5.11)
RDW: 13.8 % (ref 11.5–15.5)
WBC: 11.9 10*3/uL — ABNORMAL HIGH (ref 4.0–10.5)
nRBC: 0 % (ref 0.0–0.2)

## 2021-01-30 LAB — MAGNESIUM: Magnesium: 2 mg/dL (ref 1.7–2.4)

## 2021-01-30 LAB — TYPE AND SCREEN
ABO/RH(D): O POS
Antibody Screen: NEGATIVE
Unit division: 0

## 2021-01-30 LAB — BPAM RBC
Blood Product Expiration Date: 202203022359
ISSUE DATE / TIME: 202202010838
Unit Type and Rh: 5100

## 2021-01-30 LAB — D-DIMER, QUANTITATIVE: D-Dimer, Quant: 2.78 ug/mL-FEU — ABNORMAL HIGH (ref 0.00–0.50)

## 2021-01-30 LAB — C-REACTIVE PROTEIN: CRP: 10.5 mg/dL — ABNORMAL HIGH (ref <1.0)

## 2021-01-30 MED ORDER — PANTOPRAZOLE SODIUM 40 MG PO TBEC: 40.0000 mg | DELAYED_RELEASE_TABLET | Freq: Every day | ORAL | 0 refills | Status: DC

## 2021-01-30 MED ORDER — APIXABAN 5 MG PO TABS: 10.0000 mg | ORAL_TABLET | Freq: Two times a day (BID) | ORAL | 0 refills | Status: DC

## 2021-01-30 MED ORDER — ALBUTEROL SULFATE HFA 108 (90 BASE) MCG/ACT IN AERS
2.0000 | INHALATION_SPRAY | Freq: Four times a day (QID) | RESPIRATORY_TRACT | 0 refills | Status: DC | PRN
Start: 2021-01-30 — End: 2021-01-30

## 2021-01-30 MED ORDER — APIXABAN 5 MG PO TABS: 5.0000 mg | ORAL_TABLET | Freq: Two times a day (BID) | ORAL | 0 refills | Status: DC

## 2021-01-30 MED ORDER — METHYLPREDNISOLONE 4 MG PO TBPK: ORAL_TABLET | ORAL | 0 refills | Status: DC

## 2021-01-30 MED FILL — methylPREDNISolone 4 MG dos: 4 | 6 days supply | Qty: 21 | Fill #0

## 2021-01-30 MED FILL — PANTOPRAZOLE SOD DR 40 MG T: 40 | 30 days supply | Qty: 30 | Fill #0

## 2021-01-30 MED FILL — ALBUTEROL SULFATE HFA 108 (: 108 (90 BAS | 25 days supply | Qty: 18 | Fill #0

## 2021-01-30 NOTE — TOC Transition Note (Addendum)
Transition of Care [facility]) - CM/SW Discharge Note   Patient Details  Name: [name] Barker MRN: [id number] Date of Birth: Sep 15, 1921  Transition of Care [facility]) CM/SW Contact:  Sharin Mons, RN Phone Number: 01/30/2021, 10:59 AM   Clinical Narrative:     Patient will DC to: Home Anticipated DC date: 01/30/2021 Family notified: daughters Transport by: Corey Harold         - s/p ORIF to R hip, 1/30, hx of  CVA, colon cancer; moderate vascular dementia; and HLD   Per MD patient ready for DC today. RN, patient, and patient's family, and [facility]  notified of DC. Eliquis copay placed in d/c packet and explained to daughter [name]. Pt without Rx med concerns or affordability issues.  Post hospital f/u noted on AVS.  01/29/2021  [facility] accepted pt for [facility] services.   DC packet on chart. Ambulance transport requested for patient.   RNCM will sign off for now as intervention is no longer needed. Please consult [place] again if new needs arise.   Final next level of care: [place] Barriers to Discharge: No Barriers Identified   Patient Goals and CMS Choice        Discharge Placement                       Discharge Plan and Services   Discharge Planning Services: CM Consult                      HH Arranged: RN,PT,OT,Nurse's Aide [place] Agency: [place] Date [facility] Agency Contacted: 01/29/21 Time [place]: [phone] Representative spoke with at [place]: [place]) Interventions     Readmission Risk Interventions No flowsheet data found.

## 2021-01-30 NOTE — Discharge Instructions (Signed)
Follow with Primary MD Hoyt Koch, MD in 7 days   Get CBC, CMP, 2 view Chest X ray -  checked next visit within 1 week by Primary MD   Activity: Toe-touch weightbearing on the right leg with Full fall precautions use walker/cane & assistance as needed  Disposition Home    Diet: Heart Healthy    Special Instructions: If you have smoked or chewed Tobacco  in the last 2 yrs please stop smoking, stop any regular Alcohol  and or any Recreational drug use.  On your next visit with your primary care physician please Get Medicines reviewed and adjusted.  Please request your Prim.MD to go over all Hospital Tests and Procedure/Radiological results at the follow up, please get all Hospital records sent to your Prim MD by signing hospital release before you go home.  If you experience worsening of your admission symptoms, develop shortness of breath, life threatening emergency, suicidal or homicidal thoughts you must seek medical attention immediately by calling 911 or calling your MD immediately  if symptoms less severe.  You Must read complete instructions/literature along with all the possible adverse reactions/side effects for all the Medicines you take and that have been prescribed to you. Take any new Medicines after you have completely understood and accpet all the possible adverse reactions/side effects.        Person Under Monitoring Name: [name] Barker  Location: [place] [address]   Infection Prevention Recommendations for Individuals Confirmed to have, or Being Evaluated for, 2019 Novel Coronavirus (COVID-19) Infection Who Receive Care at Home  Individuals who are confirmed to have, or are being evaluated for, COVID-19 should follow the prevention steps below until a healthcare provider or local or state health department says they can return to normal activities.  Stay home except to get medical care You should restrict activities  outside your home, except for getting medical care. Do not go to work, school, or public areas, and do not use public transportation or taxis.  Call ahead before visiting your doctor Before your medical appointment, call the healthcare provider and tell them that you have, or are being evaluated for, COVID-19 infection. This will help the healthcare providers office take steps to keep other people from getting infected. Ask your healthcare provider to call the local or state health department.  Monitor your symptoms Seek prompt medical attention if your illness is worsening (e.g., difficulty breathing). Before going to your medical appointment, call the healthcare provider and tell them that you have, or are being evaluated for, COVID-19 infection. Ask your healthcare provider to call the local or state health department.  Wear a facemask You should wear a facemask that covers your nose and mouth when you are in the same room with other people and when you visit a healthcare provider. People who live with or visit you should also wear a facemask while they are in the same room with you.  Separate yourself from other people in your home As much as possible, you should stay in a different room from other people in your home. Also, you should use a separate bathroom, if available.  Avoid sharing household items You should not share dishes, drinking glasses, cups, eating utensils, towels, bedding, or other items with other people in your home. After using these items, you should wash them thoroughly with soap and water.  Cover your coughs and sneezes Cover your mouth and nose with a tissue when you cough or  sneeze, or you can cough or sneeze into your sleeve. Throw used tissues in a lined trash can, and immediately wash your hands with soap and water for at least 20 seconds or use an alcohol-based hand rub.  Wash your [facility] your hands often and thoroughly with soap and water for at  least 20 seconds. You can use an alcohol-based hand sanitizer if soap and water are not available and if your hands are not visibly dirty. Avoid touching your eyes, nose, and mouth with unwashed hands.   Prevention Steps for Caregivers and Household Members of Individuals Confirmed to have, or Being Evaluated for, COVID-19 Infection Being Cared for in the Home  If you live with, or provide care at home for, a person confirmed to have, or being evaluated for, COVID-19 infection please follow these guidelines to prevent infection:  Follow healthcare providers instructions Make sure that you understand and can help the patient follow any healthcare provider instructions for all care.  Provide for the patients basic needs You should help the patient with basic needs in the home and provide support for getting groceries, prescriptions, and other personal needs.  Monitor the patients symptoms If they are getting sicker, call his or her medical provider and tell them that the patient has, or is being evaluated for, COVID-19 infection. This will help the healthcare providers office take steps to keep other people from getting infected. Ask the healthcare provider to call the local or state health department.  Limit the number of people who have contact with the patient  If possible, have only one caregiver for the patient.  Other household members should stay in another home or place of residence. If this is not possible, they should stay  in another room, or be separated from the patient as much as possible. Use a separate bathroom, if available.  Restrict visitors who do not have an essential need to be in the home.  Keep older adults, very young children, and other sick people away from the patient Keep older adults, very young children, and those who have compromised immune systems or chronic health conditions away from the patient. This includes people with chronic heart, lung, or  kidney conditions, diabetes, and cancer.  Ensure good ventilation Make sure that shared spaces in the home have good air flow, such as from an air conditioner or an opened window, weather permitting.  Wash your hands often  Wash your hands often and thoroughly with soap and water for at least 20 seconds. You can use an alcohol based hand sanitizer if soap and water are not available and if your hands are not visibly dirty.  Avoid touching your eyes, nose, and mouth with unwashed hands.  Use disposable paper towels to dry your hands. If not available, use dedicated cloth towels and replace them when they become wet.  Wear a facemask and gloves  Wear a disposable facemask at all times in the room and gloves when you touch or have contact with the patients blood, body fluids, and/or secretions or excretions, such as sweat, saliva, sputum, nasal mucus, vomit, urine, or feces.  Ensure the mask fits over your nose and mouth tightly, and do not touch it during use.  Throw out disposable facemasks and gloves after using them. Do not reuse.  Wash your hands immediately after removing your facemask and gloves.  If your personal clothing becomes contaminated, carefully remove clothing and launder. Wash your hands after handling contaminated clothing.  Place all used  disposable facemasks, gloves, and other waste in a lined container before disposing them with other household waste.  Remove gloves and wash your hands immediately after handling these items.  Do not share dishes, glasses, or other household items with the patient  Avoid sharing household items. You should not share dishes, drinking glasses, cups, eating utensils, towels, bedding, or other items with a patient who is confirmed to have, or being evaluated for, COVID-19 infection.  After the person uses these items, you should wash them thoroughly with soap and water.  Wash laundry thoroughly  Immediately remove and wash clothes  or bedding that have blood, body fluids, and/or secretions or excretions, such as sweat, saliva, sputum, nasal mucus, vomit, urine, or feces, on them.  Wear gloves when handling laundry from the patient.  Read and follow directions on labels of laundry or clothing items and detergent. In general, wash and dry with the warmest temperatures recommended on the label.  Clean all areas the individual has used often  Clean all touchable surfaces, such as counters, tabletops, doorknobs, bathroom fixtures, toilets, phones, keyboards, tablets, and bedside tables, every day. Also, clean any surfaces that may have blood, body fluids, and/or secretions or excretions on them.  Wear gloves when cleaning surfaces the patient has come in contact with.  Use a diluted bleach solution (e.g., dilute bleach with 1 part bleach and 10 parts water) or a household disinfectant with a label that says EPA-registered for coronaviruses. To make a bleach solution at home, add 1 tablespoon of bleach to 1 quart (4 cups) of water. For a larger supply, add  cup of bleach to 1 gallon (16 cups) of water.  Read labels of cleaning products and follow recommendations provided on product labels. Labels contain instructions for safe and effective use of the cleaning product including precautions you should take when applying the product, such as wearing gloves or eye protection and making sure you have good ventilation during use of the product.  Remove gloves and wash hands immediately after cleaning.  Monitor yourself for signs and symptoms of illness Caregivers and household members are considered close contacts, should monitor their health, and will be asked to limit movement outside of the home to the extent possible. Follow the monitoring steps for close contacts listed on the symptom monitoring form.   ? If you have additional questions, contact your local health department or call the epidemiologist on call at  [phone] (available 24/7). ? This guidance is subject to change. For the most up-to-date guidance from [facility], please refer to their website: YouBlogs.pl       Information on my medicine - ELIQUIS (apixaban)  Why was Eliquis prescribed for you? Eliquis was prescribed to treat blood clots that may have been found in the veins of your legs (deep vein thrombosis) or in your lungs (pulmonary embolism) and to reduce the risk of them occurring again.  What do You need to know about Eliquis ? The starting dose is 10 mg (two 5 mg tablets) taken TWICE daily for the FIRST SEVEN (7) DAYS, then on 02/05/2021 the dose is reduced to ONE 5 mg tablet taken TWICE daily.  Eliquis may be taken with or without food.   Try to take the dose about the same time in the morning and in the evening. If you have difficulty swallowing the tablet whole please discuss with your pharmacist how to take the medication safely.  Take Eliquis exactly as prescribed and DO NOT stop taking Eliquis without  talking to the doctor who prescribed the medication.  Stopping may increase your risk of developing a new blood clot.  Refill your prescription before you run out.  After discharge, you should have regular check-up appointments with your healthcare provider that is prescribing your Eliquis.    What do you do if you miss a dose? If a dose of ELIQUIS is not taken at the scheduled time, take it as soon as possible on the same day and twice-daily administration should be resumed. The dose should not be doubled to make up for a missed dose.  Important Safety Information A possible side effect of Eliquis is bleeding. You should call your healthcare provider right away if you experience any of the following: ? Bleeding from an injury or your nose that does not stop. ? Unusual colored urine (red or dark brown) or unusual colored stools (red or black). ? Unusual  bruising for unknown reasons. ? A serious fall or if you hit your head (even if there is no bleeding).  Some medicines may interact with Eliquis and might increase your risk of bleeding or clotting while on Eliquis. To help avoid this, consult your healthcare provider or pharmacist prior to using any new prescription or non-prescription medications, including herbals, vitamins, non-steroidal anti-inflammatory drugs (NSAIDs) and supplements.  This website has more information on Eliquis (apixaban): http://www.eliquis.com/eliquis/home

## 2021-01-30 NOTE — Progress Notes (Signed)
Occupational Therapy Treatment Patient Details Name: [name] Barker MRN: [id number] DOB: 12/09/1921 Today's Date: 01/30/2021    History of present illness [name] Barker is a 87 y.o. female with medical history significant of CVA, on Plavix; colon cancer; moderate vascular dementia; and HLD presenting with a fall that happened in her bathroom witnessed by the daughter.  Subsequently complained of right hip pain was brought to the ER where she was found to have right hip fracture and admitted; now s/p ORIF, TWB; found to be covid positive   OT comments  Pt making slow progress with functional goals. Pt continues to require extensive amount of assist for bed mobility to sit EOB.Pt able to initiate bringing LEs to EOB, however ultimately requiring max A to complete Did not attempt standing or SPTs this session due to pt requiring + 2 assist that was unavailable at the time.  Patient with incoherent speech for most words/sentences but was oriented to self this session(hx of vascular dementia). Pt donned clean gown with min A seated EOB  Follow Up Recommendations  SNF;Supervision/Assistance - 24 hour    Equipment Recommendations  Wheelchair (measurements OT);Wheelchair cushion (measurements OT);Other (comment) (drop arm BSC, hospital bed)    Recommendations for Other Services      Precautions / Restrictions Precautions Precautions: Fall Restrictions Weight Bearing Restrictions: Yes RLE Weight Bearing: Touchdown weight bearing Other Position/Activity Restrictions: pt unable to maintain TWB, hx of dementia/cognitive deficits       Mobility Bed Mobility Overal bed mobility: Needs Assistance Bed Mobility: Supine to Sit;Sit to Supine     Supine to sit: Max assist Sit to supine: Total assist   General bed mobility comments: Patient able to initiate getting to EOB with advancing B LEs  Transfers                 General transfer comment: unable to this session, needs +2  assist, pt unable to maintain TWB status    Balance Overall balance assessment: Needs assistance Sitting-balance support: Feet supported Sitting balance-Leahy Scale: Fair Sitting balance - Comments: initially requiring mod A progressing to min guard A                                   ADL either performed or assessed with clinical judgement   ADL Overall ADL's : Needs assistance/impaired     Grooming: Wash/dry hands;Wash/dry face;Brushing hair;Min guard;Sitting           Upper Body Dressing : Minimal assistance;Sitting       Toilet Transfer: Maximal assistance;+2 for physical assistance;Cueing for sequencing;Stand-pivot   Toileting- Clothing Manipulation and Hygiene: Total assistance       Functional mobility during ADLs: Maximal assistance;+2 for physical assistance;Cueing for sequencing General ADL Comments: Pt requires multimodal cues for initiation of ADL tasks due to cognitive impairments     Vision       Perception     Praxis      Cognition Arousal/Alertness: Awake/alert Behavior During Therapy: WFL for tasks assessed/performed Overall Cognitive Status: History of cognitive impairments - at baseline                                 General Comments: hx of vascular dementia, difficulty following directions and oriented to self        Exercises     Shoulder Instructions  General Comments      Pertinent Vitals/ Pain       Pain Assessment: Faces Faces Pain Scale: Hurts even more Pain Location: R LE with movement Pain Descriptors / Indicators: Grimacing;Guarding Pain Intervention(s): Limited activity within patient's tolerance;Monitored during session;Repositioned  Home Living                                          Prior Functioning/Environment              Frequency  Min 2X/week        Progress Toward Goals  OT Goals(current goals can now be found in the care plan section)   Progress towards OT goals: Progressing toward goals  Acute Rehab OT Goals Patient Stated Goal: did not state  Plan Discharge plan remains appropriate    Co-evaluation                 AM-PAC OT "6 Clicks" Daily Activity     Outcome Measure   Help from another person eating meals?: A Little Help from another person taking care of personal grooming?: A Little Help from another person toileting, which includes using toliet, bedpan, or urinal?: Total Help from another person bathing (including washing, rinsing, drying)?: Total Help from another person to put on and taking off regular upper body clothing?: A Little Help from another person to put on and taking off regular lower body clothing?: Total 6 Click Score: 12    End of Session    OT Visit Diagnosis: Unsteadiness on feet (R26.81);Other abnormalities of gait and mobility (R26.89);Muscle weakness (generalized) (M62.81);History of falling (Z91.81);Other symptoms and signs involving cognitive function;Cognitive communication deficit (R41.841);Pain Pain - Right/Left: Right Pain - part of body: Leg;Hip   Activity Tolerance Patient tolerated treatment well   Patient Left in bed;with bed alarm set;with call bell/phone within reach   Nurse Communication          Time: 0814-4818 OT Time Calculation (min): 24 min  Charges: OT General Charges $OT Visit: 1 Visit OT Treatments $Self Care/Home Management : 8-22 mins $Therapeutic Activity: 8-22 mins     Britt Bottom 01/30/2021, 3:22 PM

## 2021-01-30 NOTE — Discharge Summary (Signed)
[name] Barker [id number] DOB: February 04, 1921 DOA: 01/26/2021  PCP: Hoyt Koch, MD  Admit date: 01/26/2021  Discharge date: 01/30/2021  Admitted From: Home   Disposition:  Home   Recommendations for Outpatient Follow-up:   Follow up with PCP in 1-2 weeks  PCP Please obtain BMP/CBC, 2 view CXR in 1week,  (see Discharge instructions)   PCP Please follow up on the following pending results:    Home Health: PT,RN   Equipment/Devices: as below  Consultations: Ortho Discharge Condition: Stable    CODE STATUS: Full    Diet Recommendation: Heart Healthy   Diet Order            Diet - low sodium heart healthy           Diet regular Room service appropriate? No; Fluid consistency: Thin  Diet effective now                  Chief Complaint  Patient presents with  . Fall     Brief history of present illness from the day of admission and additional interim summary    [name] Barker a 87 y.o.femalewith medical history significant ofCVA, on Plavix; colon cancer;moderate vascular dementia;and HLD presenting with a fall that happened in her bathroom witnessed by the daughter.  Subsequently complained of right hip pain was brought to the ER where she was found to have right hip fracture and admitted.                                                                 Hospital Course   1.  Mechanical fall with right hip fracture. Post ORIF by Dr. Alvan Dame on 01/27/2021, seems to have tolerated the procedure well, she did get some perioperative blood loss related anemia requiring 1 unit of packed RBC transfusion, stable posttransfusion H&H, since she has DVT in her right leg also which clinically appears to be subacute she will receive Eliquis for DVT treatment, toe-touch bearing in the right lower  extremity per orthopedics.  Daughter would like to take her home with home health PT, discharge home today with home health.   2. Acute COVID-19 infection-most likely incidental, stable chest x-ray with no hypoxia.  3 days of remdesivir, since CRP is quite elevated now we will give her a short course of Decadron while in the hospital thereafter transition to a Medrol Dosepak.   Recent Labs  Lab 01/26/21 1400 01/26/21 1437 01/26/21 1446 01/26/21 1739 01/26/21 2140 01/27/21 0231 01/28/21 0839 01/29/21 0246 01/30/21 0722  WBC  --  6.3  --   --   --  8.7 8.9 11.9* 11.9*  CRP  --   --   --  2.2*  --  4.4* 12.1* 12.9* 10.5*  DDIMER  --   --   --  >  20.00*  --  14.43* 3.39* 1.88* 2.78*  PROCALCITON  --   --   --   --  <0.10  --   --   --   --   AST  --   --   --   --   --  27 29  --   --   ALT  --   --   --   --   --  15 14  --   --   ALKPHOS  --   --   --   --   --  57 43  --   --   BILITOT  --   --   --   --   --  0.2* 0.4  --   --   ALBUMIN  --   --   --   --   --  3.3* 2.9*  --   --   INR 1.1  --   --   --   --   --   --   --   --   SARSCOV2NAA  --   --  POSITIVE*  --   --   --   --   --   --       3.  History of CVA.  Was on Plavix, now switched to Eliquis for #4 below.  4. R.Leg DVT with ++ D Dimer - clinically acute, Hep gtt >> Eliquis.  Minimum 6 months.   5. Underlying history of depression.  On Celexa.  6.  History of moderate vascular dementia.  At risk for delirium, minimize narcotics and benzodiazepines.  If delirious use Haldol instead.  7. Mild periop blood loss related Anaemia - 1 unit PRBC 01/29/21 - monitor, post op site looks fine.   Discharge diagnosis     Principal Problem:   Closed right hip fracture, initial encounter [facility]) Active Problems:   History of CVA (cerebrovascular accident)   COVID-19   DNR (do not resuscitate)    Discharge instructions    Discharge Instructions    Diet - low sodium heart healthy   Complete by: As directed     Discharge instructions   Complete by: As directed    Follow with Primary MD Hoyt Koch, MD in 7 days   Get CBC, CMP, 2 view Chest X ray -  checked next visit within 1 week by Primary MD   Activity: Toe-touch weightbearing on the right leg with Full fall precautions use walker/cane & assistance as needed  Disposition Home    Diet: Heart Healthy    Special Instructions: If you have smoked or chewed Tobacco  in the last 2 yrs please stop smoking, stop any regular Alcohol  and or any Recreational drug use.  On your next visit with your primary care physician please Get Medicines reviewed and adjusted.  Please request your Prim.MD to go over all Hospital Tests and Procedure/Radiological results at the follow up, please get all Hospital records sent to your Prim MD by signing hospital release before you go home.  If you experience worsening of your admission symptoms, develop shortness of breath, life threatening emergency, suicidal or homicidal thoughts you must seek medical attention immediately by calling 911 or calling your MD immediately  if symptoms less severe.  You Must read complete instructions/literature along with all the possible adverse reactions/side effects for all the Medicines you take and that have been prescribed to you. Take any new Medicines after you  have completely understood and accpet all the possible adverse reactions/side effects.   Discharge wound care:   Complete by: As directed    Keep incision site clean and dry at all times.   Touch down weight bearing   Complete by: As directed       Discharge Medications   Allergies as of 01/30/2021      Reactions   Morphine And Related Other (See Comments)   Blisters at site and along vein   Ativan [lorazepam] Anxiety      Medication List    STOP taking these medications   clopidogrel 75 MG tablet Commonly known as: PLAVIX     TAKE these medications   albuterol 108 (90 Base) MCG/ACT  inhaler Commonly known as: VENTOLIN HFA Inhale 2 puffs into the lungs every 6 (six) hours as needed for wheezing or shortness of breath.   apixaban 5 MG Tabs tablet Commonly known as: ELIQUIS Take 2 tablets (10 mg total) by mouth 2 (two) times daily for 5 days.   apixaban 5 MG Tabs tablet Commonly known as: ELIQUIS Take 1 tablet (5 mg total) by mouth 2 (two) times daily. Start taking on: February 05, 2021   citalopram 10 MG tablet Commonly known as: CELEXA Take 10 mg by mouth daily.   Cranberry 500 MG Tabs Take 500 mg by mouth daily.   famotidine 20 MG tablet Commonly known as: PEPCID Take 20 mg by mouth at bedtime.   fexofenadine 180 MG tablet Commonly known as: ALLEGRA Take 180 mg by mouth in the morning.   fluticasone 50 MCG/ACT nasal spray Commonly known as: FLONASE Place 1 spray into both nostrils daily.   HYDROcodone-acetaminophen 5-325 MG tablet Commonly known as: NORCO/VICODIN Take 1 tablet by mouth every 6 (six) hours as needed for severe pain.   methylPREDNISolone 4 MG Tbpk tablet Commonly known as: MEDROL DOSEPAK follow package directions   multivitamin tablet Take 1 tablet by mouth daily.   pantoprazole 40 MG tablet Commonly known as: Protonix Take 1 tablet (40 mg total) by mouth daily.   PRESERVISION AREDS 2 PO Take 1 tablet by mouth in the morning and at bedtime.   Vitamin D (Cholecalciferol) 25 MCG (1000 UT) Tabs Take 1,000 mg by mouth daily.            Discharge Care Instructions  (From admission, onward)         Start     Ordered   01/30/21 0000  Discharge wound care:       Comments: Keep incision site clean and dry at all times.   01/30/21 0930   01/29/21 0000  Touch down weight bearing        01/29/21 [id number]           Follow-up Information    Paralee Cancel, MD. Schedule an appointment as soon as possible for a visit in 2 weeks.   Specialty: Orthopedic Surgery Contact information: [address] [place] [address] [phone]        Hoyt Koch, MD Follow up.   Specialty: Internal Medicine Contact information: [place] [address] [phone]        Care, [facility] Follow up.   Specialty: Home Health Services Why: Home health services will be provided by [place] information: [place] [address] [phone]               Major procedures and Radiology  Reports - PLEASE review detailed and final reports thoroughly  -       DG Pelvis 1-2 Views  Result Date: 01/26/2021 CLINICAL DATA:  Fall, visibly shortened RIGHT leg, on blood thinners EXAM: PELVIS - 1-2 VIEW COMPARISON:  Portable exam 1425 hours without priors for comparison FINDINGS: Comminuted displaced and angulated intertrochanteric fracture RIGHT femur. No dislocation. Narrowing of the hip joints bilaterally. Orthopedic hardware at LEFT femoral diaphysis. Pelvis intact with SI joints symmetric. IMPRESSION: Comminuted displaced and angulated intertrochanteric fracture RIGHT femur. Electronically Signed   By: Lavonia Dana M.D.   On: 01/26/2021 14:55   CT HEAD WO CONTRAST  Result Date: 01/26/2021 CLINICAL DATA:  Head trauma.  Patient on blood thinners EXAM: CT HEAD WITHOUT CONTRAST TECHNIQUE: Contiguous axial images were obtained from the base of the skull through the vertex without intravenous contrast. COMPARISON:  None. FINDINGS: Brain: No acute intracranial hemorrhage. No focal mass lesion. No CT evidence of acute infarction. No midline shift or mass effect. No hydrocephalus. Basilar cisterns are patent. Small lacunar infarction brainstem measures 3 mm on image 12/3). There are periventricular and subcortical white matter hypodensities. Generalized cortical atrophy. Vascular: No hyperdense vessel or unexpected calcification. Skull: Normal. Negative for fracture or focal lesion. Sinuses/Orbits: Paranasal sinuses and mastoid air cells are  clear. Orbits are clear. Other: None. IMPRESSION: 1. No acute intracranial findings. 2. Atrophy and white matter microvascular disease. Electronically Signed   By: Suzy Bouchard M.D.   On: 01/26/2021 15:35   DG Chest Port 1 View  Result Date: 01/26/2021 CLINICAL DATA:  Clearance trauma, fall EXAM: PORTABLE CHEST 1 VIEW COMPARISON:  None. FINDINGS: Patient rotated rightward. Enlarged cardiac silhouette. Ectatic aorta. No effusion, infiltrate or pneumothorax. No pulmonary contusion. No pleural fluid. No acute osseous abnormality. IMPRESSION: 1. Cardiomegaly. 2. No acute cardiopulmonary process. Electronically Signed   By: Suzy Bouchard M.D.   On: 01/26/2021 14:55   DG C-Arm 1-60 Min  Result Date: 01/27/2021 CLINICAL DATA:  Right femur intramedullary nail. EXAM: DG C-ARM 1-60 MIN; RIGHT FEMUR 2 VIEWS FLUOROSCOPY TIME:  Fluoroscopy Time:  1 minutes 47 seconds Radiation Exposure Index (if provided by the fluoroscopic device): 14.92 mGy Number of Acquired Spot Images: 3 COMPARISON:  Preoperative radiograph yesterday. FINDINGS: Three fluoroscopic spot views of the right femur obtained in the operating room. Intramedullary rod with trans trochanteric and distal locking screw traverse intertrochanteric femur fracture. Fracture is in improved alignment compared to preoperative imaging. IMPRESSION: Intraoperative fluoroscopy after ORIF of intertrochanteric femur fracture. Electronically Signed   By: Keith Rake M.D.   On: 01/27/2021 19:56   DG FEMUR, MIN 2 VIEWS RIGHT  Result Date: 01/27/2021 CLINICAL DATA:  Right femur intramedullary nail. EXAM: DG C-ARM 1-60 MIN; RIGHT FEMUR 2 VIEWS FLUOROSCOPY TIME:  Fluoroscopy Time:  1 minutes 47 seconds Radiation Exposure Index (if provided by the fluoroscopic device): 14.92 mGy Number of Acquired Spot Images: 3 COMPARISON:  Preoperative radiograph yesterday. FINDINGS: Three fluoroscopic spot views of the right femur obtained in the operating room. Intramedullary  rod with trans trochanteric and distal locking screw traverse intertrochanteric femur fracture. Fracture is in improved alignment compared to preoperative imaging. IMPRESSION: Intraoperative fluoroscopy after ORIF of intertrochanteric femur fracture. Electronically Signed   By: Keith Rake M.D.   On: 01/27/2021 19:56   DG FEMUR, MIN 2 VIEWS RIGHT  Result Date: 01/26/2021 CLINICAL DATA:  Pain EXAM: RIGHT FEMUR 2 VIEWS COMPARISON:  None. FINDINGS: there is an acute comminuted intratrochanteric fracture of the proximal right femur.  There is no dislocation. There are advanced degenerative changes of the right hip. There is no acute displaced fracture involving the distal femur. There are end-stage degenerative changes of the right knee, especially within the lateral compartment. IMPRESSION: 1. Acute comminuted intratrochanteric fracture of the proximal right femur. 2. Advanced degenerative changes of the right hip. 3. End-stage degenerative changes of the right knee. Electronically Signed   By: Constance Holster M.D.   On: 01/26/2021 15:46   VAS [place] LOWER EXTREMITY VENOUS (DVT)  Result Date: 01/27/2021  Lower Venous DVT Study Indications: Covid-19, elevated D-Dimer, hip fracture.  Limitations: Hip fracture, patient confusion. Comparison Study: No prior study Performing Technologist: Sharion Dove RVS  Examination Guidelines: A complete evaluation includes B-mode imaging, spectral Doppler, color Doppler, and power Doppler as needed of all accessible portions of each vessel. Bilateral testing is considered an integral part of a complete examination. Limited examinations for reoccurring indications may be performed as noted. The reflux portion of the exam is performed with the patient in reverse Trendelenburg.  +---------+---------------+---------+-----------+----------+-------------------+ RIGHT    CompressibilityPhasicitySpontaneityPropertiesThrombus Aging       +---------+---------------+---------+-----------+----------+-------------------+ CFV      Full           Yes      Yes                                      +---------+---------------+---------+-----------+----------+-------------------+ SFJ      Full                                                             +---------+---------------+---------+-----------+----------+-------------------+ FV Prox  Full                                                             +---------+---------------+---------+-----------+----------+-------------------+ FV Mid   Full                                                             +---------+---------------+---------+-----------+----------+-------------------+ FV DistalFull                                                             +---------+---------------+---------+-----------+----------+-------------------+ PFV      Full                                                             +---------+---------------+---------+-----------+----------+-------------------+ POP  Not well visualized +---------+---------------+---------+-----------+----------+-------------------+ PTV      None                                                             +---------+---------------+---------+-----------+----------+-------------------+ PERO                                                  Not well visualized +---------+---------------+---------+-----------+----------+-------------------+   +---------+---------------+---------+-----------+----------+-------------------+ LEFT     CompressibilityPhasicitySpontaneityPropertiesThrombus Aging      +---------+---------------+---------+-----------+----------+-------------------+ CFV      Full           Yes      Yes                                      +---------+---------------+---------+-----------+----------+-------------------+  SFJ      Full                                                             +---------+---------------+---------+-----------+----------+-------------------+ FV Prox  Full                                                             +---------+---------------+---------+-----------+----------+-------------------+ FV Mid   Full                                                             +---------+---------------+---------+-----------+----------+-------------------+ FV DistalFull                                                             +---------+---------------+---------+-----------+----------+-------------------+ PFV      Full                                                             +---------+---------------+---------+-----------+----------+-------------------+ POP                     Yes      Yes                  patent by color and  Doppler             +---------+---------------+---------+-----------+----------+-------------------+ PTV      Full                                                             +---------+---------------+---------+-----------+----------+-------------------+ PERO                                                  Not well visualized +---------+---------------+---------+-----------+----------+-------------------+    Summary: RIGHT: - Findings consistent with age indeterminate deep vein thrombosis involving the right posterior tibial veins.  LEFT: - There is no evidence of deep vein thrombosis in the lower extremity. However, portions of this examination were limited- see technologist comments above.  *See table(s) above for measurements and observations. Electronically signed by Jamelle Haring on 01/27/2021 at 5:56:16 PM.    Final     Micro Results     Recent Results (from the past 240 hour(s))  SARS Coronavirus 2 by RT PCR (hospital order, performed in [facility]  hospital lab) Nasopharyngeal Nasopharyngeal Swab     Status: Abnormal   Collection Time: 01/26/21  2:46 PM   Specimen: Nasopharyngeal Swab  Result Value Ref Range Status   SARS Coronavirus 2 POSITIVE (A) NEGATIVE Final    Comment: RESULT CALLED TO, READ BACK BY AND VERIFIED WITH: M,CARDUFF @1625  01/26/21 EB (NOTE) SARS-CoV-2 target nucleic acids are DETECTED  SARS-CoV-2 RNA is generally detectable in upper respiratory specimens  during the acute phase of infection.  Positive results are indicative  of the presence of the identified virus, but do not rule out bacterial infection or co-infection with other pathogens not detected by the test.  Clinical correlation with patient history and  other diagnostic information is necessary to determine patient infection status.  The expected result is negative.  Fact Sheet for Patients:   StrictlyIdeas.no   Fact Sheet for Healthcare Providers:   BankingDealers.co.za    This test is not yet approved or cleared by the [place] FDA and  has been authorized for detection and/or diagnosis of SARS-CoV-2 by FDA under an Emergency Use Authorization (EUA).  This EUA will remain in effect (meaning this test can b e used) for the duration of  the COVID-19 declaration under Section 564(b)(1) of the Act, 21 U.S.C. section 360-bbb-3(b)(1), unless the authorization is terminated or revoked sooner.  Performed at [facility], [place] [address], [place], [place] [address]   Surgical PCR screen     Status: None   Collection Time: 01/27/21 11:55 AM   Specimen: Nasal Mucosa; Nasal Swab  Result Value Ref Range Status   MRSA, PCR NEGATIVE NEGATIVE Final   Staphylococcus aureus NEGATIVE NEGATIVE Final    Comment: (NOTE) The Xpert SA Assay (FDA approved for NASAL specimens in patients 36 years of age and older), is one component of a comprehensive surveillance program. It is not intended to diagnose  infection nor to guide or monitor treatment. Performed at [facility], [place] [address] [place], [place] [address]     Today   Subjective    [name] Barker today in bed slightly confused but denies any headache  or chest pain.  No shortness of breath.   Objective   Blood pressure (!) 152/49, pulse 72, temperature 98 F (36.7 C), temperature source Oral, resp. rate 16, height 5\' 1"  (1.549 m), weight 63.5 kg, SpO2 94 %.   Intake/Output Summary (Last 24 hours) at 01/30/2021 0930 Last data filed at 01/30/2021 0655 Gross per 24 hour  Intake 500 ml  Output 550 ml  Net -50 ml    Exam  Awake but mildly confused, moving all 4 extremities, [place].AT,PERRAL Supple Neck,No JVD, No cervical lymphadenopathy appriciated.  Symmetrical Chest wall movement, Good air movement bilaterally, CTAB RRR,No Gallops,Rubs or new Murmurs, No Parasternal Heave +ve B.Sounds, Abd Soft, Non tender, No organomegaly appriciated, No rebound -guarding or rigidity. No Cyanosis, right hip postop site appears clear with no surrounding hematoma   Data Review   CBC w Diff:  Lab Results  Component Value Date   WBC 11.9 (H) 01/30/2021   HGB 8.7 (L) 01/30/2021   HCT 27.4 (L) 01/30/2021   PLT 237 01/30/2021   LYMPHOPCT 22 01/26/2021   MONOPCT 8 01/26/2021   EOSPCT 0 01/26/2021   BASOPCT 1 01/26/2021    CMP:  Lab Results  Component Value Date   NA 137 01/30/2021   K 3.8 01/30/2021   CL 97 (L) 01/30/2021   CO2 29 01/30/2021   BUN 27 (H) 01/30/2021   CREATININE 0.53 01/30/2021   PROT 6.1 (L) 01/28/2021   ALBUMIN 2.9 (L) 01/28/2021   BILITOT 0.4 01/28/2021   ALKPHOS 43 01/28/2021   AST 29 01/28/2021   ALT 14 01/28/2021  .   Total Time in preparing paper work, data evaluation and todays exam - 39 minutes  Lala Lund M.D on 01/30/2021 at 9:30 AM  [facility]

## 2021-01-30 NOTE — Progress Notes (Signed)
Physical Therapy Treatment Patient Details Name: [name] Barker MRN: [id number] DOB: 19-Sep-1921 Today's Date: 01/30/2021    History of Present Illness [name] Barker is a 87 y.o. female with medical history significant of CVA, on Plavix; colon cancer; moderate vascular dementia; and HLD presenting with a fall that happened in her bathroom witnessed by the daughter.  Subsequently complained of right hip pain was brought to the ER where she was found to have right hip fracture and admitted; now s/p ORIF, TWB; found to be covid positive    PT Comments    Patient requires extensive amount of assist for bed mobility and sit to stand transfer. Patient oriented to self thi session due to hx of vascular dementia. Patient able to initiate bringing LEs to EOB, however ultimately requiring maxA+2 to complete. Patient unable to maintain TDWB on R during standing even with tactile cues of foot under patient's foot. Patient requires maxA+2 to maintain standing balance for <30 seconds. Recommend SNF following discharge as patient requires +2 assistance for mobility and feel that this level of assist is unsustainable at home for one person at this time.    Follow Up Recommendations  SNF;Supervision/Assistance - 24 hour (Patient's daughter would like to take her home, recommend HHPT/OT/Aide to help with +2 assistance required for mobility)     Equipment Recommendations  Rolling [place]a Lieb with 5" wheels;3in1 (PT);Wheelchair (measurements PT);Wheelchair cushion (measurements PT);Hospital bed (drop arm BSC)    Recommendations for Other Services       Precautions / Restrictions Precautions Precautions: Fall Restrictions Weight Bearing Restrictions: Yes RLE Weight Bearing: Touchdown weight bearing Other Position/Activity Restrictions: pt unable to maintain TWB, hx of dementia/cognitive deficits    Mobility  Bed Mobility Overal bed mobility: Needs Assistance Bed Mobility: Supine to Sit;Sit to  Supine     Supine to sit: Max assist;+2 for physical assistance;+2 for safety/equipment Sit to supine: Total assist;+2 for physical assistance;+2 for safety/equipment   General bed mobility comments: Patient able to initiate getting to EOB with advancing B LEs, however required maxA+2 for completion. TotalA+2 to return to supine  Transfers Overall transfer level: Needs assistance Equipment used: Rolling Chadrick Sprinkle (2 wheeled) Transfers: Sit to/from Stand Sit to Stand: Max assist;+2 physical assistance;+2 safety/equipment         General transfer comment: maxA+2 for sit to stand from EOB, unable to maintain TDWB on R LE in standing even with tactile cues of foot under patient's R foot  Ambulation/Gait                 Stairs             Wheelchair Mobility    Modified Rankin (Stroke Patients Only)       Balance Overall balance assessment: Needs assistance Sitting-balance support: Feet supported Sitting balance-Leahy Scale: Fair Sitting balance - Comments: initially requiring modA progressing to close supervision   Standing balance support: Bilateral upper extremity supported;During functional activity Standing balance-Leahy Scale: Zero Standing balance comment: requires maxA+2 to maintain standing balance                            Cognition Arousal/Alertness: Awake/alert Behavior During Therapy: WFL for tasks assessed/performed Overall Cognitive Status: History of cognitive impairments - at baseline                                 General Comments: hx of  vascular dementia, difficulty following directions and oriented to self      Exercises General Exercises - Lower Extremity Long Arc Quad: Right;Left;5 reps (attempted R LE, however patient able to extend but not flex)    General Comments        Pertinent Vitals/Pain Pain Assessment: Faces Faces Pain Scale: Hurts even more Pain Location: R LE with movement Pain Descriptors  / Indicators: Grimacing;Guarding Pain Intervention(s): Limited activity within patient's tolerance;Monitored during session    Home Living                      Prior Function            PT Goals (current goals can now be found in the care plan section) Acute Rehab PT Goals Patient Stated Goal: did not state PT Goal Formulation: Patient unable to participate in goal setting Time For Goal Achievement: 02/10/21 Potential to Achieve Goals: Fair Progress towards PT goals: Progressing toward goals    Frequency    Min 2X/week      PT Plan Current plan remains appropriate    Co-evaluation              AM-PAC PT "6 Clicks" Mobility   Outcome Measure  Help needed turning from your back to your side while in a flat bed without using bedrails?: A Lot Help needed moving from lying on your back to sitting on the side of a flat bed without using bedrails?: A Lot Help needed moving to and from a bed to a chair (including a wheelchair)?: A Lot Help needed standing up from a chair using your arms (e.g., wheelchair or bedside chair)?: A Lot Help needed to walk in hospital room?: Total Help needed climbing 3-5 steps with a railing? : Total 6 Click Score: 10    End of Session Equipment Utilized During Treatment: Gait belt Activity Tolerance: Patient limited by pain Patient left: in bed;with call bell/phone within reach;with bed alarm set Nurse Communication: Mobility status PT Visit Diagnosis: Unsteadiness on feet (R26.81);Other abnormalities of gait and mobility (R26.89);Muscle weakness (generalized) (M62.81);Pain Pain - Right/Left: Right Pain - part of body: Leg     Time: 6945-0388 PT Time Calculation (min) (ACUTE ONLY): 31 min  Charges:  $Therapeutic Activity: 23-37 mins                     Aliese Brannum A. Gilford Rile PT, DPT Acute Rehabilitation Services Pager [phone] Office [phone]    Melene Plan Allred 01/30/2021, 10:03 AM

## 2021-01-30 NOTE — Progress Notes (Signed)
Patient discharged home with transportation provided by [facility].  [facility] personnel received all discharge information including AVS, facesheet and medical necessity, with education provided to patient's daughter.  No questions were noted at time of discharge.  All VSS and all lines including purewick and PIV removed.

## 2021-01-31 ENCOUNTER — Encounter: Payer: Self-pay | Admitting: Internal Medicine

## 2021-02-01 DIAGNOSIS — Z7952 Long term (current) use of systemic steroids: Secondary | ICD-10-CM | POA: Diagnosis not present

## 2021-02-01 DIAGNOSIS — I77819 Aortic ectasia, unspecified site: Secondary | ICD-10-CM | POA: Diagnosis not present

## 2021-02-01 DIAGNOSIS — F0150 Vascular dementia without behavioral disturbance: Secondary | ICD-10-CM | POA: Diagnosis not present

## 2021-02-01 DIAGNOSIS — U071 COVID-19: Secondary | ICD-10-CM | POA: Diagnosis not present

## 2021-02-01 DIAGNOSIS — Z86718 Personal history of other venous thrombosis and embolism: Secondary | ICD-10-CM | POA: Diagnosis not present

## 2021-02-01 DIAGNOSIS — Z7901 Long term (current) use of anticoagulants: Secondary | ICD-10-CM | POA: Diagnosis not present

## 2021-02-01 DIAGNOSIS — H9190 Unspecified hearing loss, unspecified ear: Secondary | ICD-10-CM | POA: Diagnosis not present

## 2021-02-01 DIAGNOSIS — E785 Hyperlipidemia, unspecified: Secondary | ICD-10-CM | POA: Diagnosis not present

## 2021-02-01 DIAGNOSIS — I517 Cardiomegaly: Secondary | ICD-10-CM | POA: Diagnosis not present

## 2021-02-01 DIAGNOSIS — Z8673 Personal history of transient ischemic attack (TIA), and cerebral infarction without residual deficits: Secondary | ICD-10-CM | POA: Diagnosis not present

## 2021-02-01 DIAGNOSIS — Z9181 History of falling: Secondary | ICD-10-CM | POA: Diagnosis not present

## 2021-02-01 DIAGNOSIS — F32A Depression, unspecified: Secondary | ICD-10-CM | POA: Diagnosis not present

## 2021-02-01 DIAGNOSIS — Z79899 Other long term (current) drug therapy: Secondary | ICD-10-CM | POA: Diagnosis not present

## 2021-02-01 DIAGNOSIS — S72141D Displaced intertrochanteric fracture of right femur, subsequent encounter for closed fracture with routine healing: Secondary | ICD-10-CM | POA: Diagnosis not present

## 2021-02-01 DIAGNOSIS — Z85038 Personal history of other malignant neoplasm of large intestine: Secondary | ICD-10-CM | POA: Diagnosis not present

## 2021-02-01 DIAGNOSIS — D509 Iron deficiency anemia, unspecified: Secondary | ICD-10-CM | POA: Diagnosis not present

## 2021-02-04 ENCOUNTER — Telehealth: Payer: Self-pay | Admitting: Pharmacist

## 2021-02-04 NOTE — Progress Notes (Signed)
Chronic Care Management Pharmacy Assistant   Name: [name] Barker  MRN: [id number] DOB: 10/22/1921  Reason for Encounter: Initial Questions   PCP : Hoyt Koch, MD  Allergies:   Allergies  Allergen Reactions   Morphine And Related Other (See Comments)    Blisters at site and along vein   Flexeril [Cyclobenzaprine] Other (See Comments)    Nightmares    Methenamine Mandelate Other (See Comments)    Burning sensation   Penicillins Hives    Did it involve swelling of the face/tongue/throat, SOB, or low BP? Yes Did it involve sudden or severe rash/hives, skin peeling, or any reaction on the inside of your mouth or nose? Unk Did you need to seek medical attention at a hospital or doctor's office? Unk When did it last happen? Childhood If all above answers are NO, may proceed with cephalosporin use.    Promethazine Hcl Other (See Comments)    Hallucinations   Statins Other (See Comments)    Muscle pain    Ativan [Lorazepam] Anxiety   Morphine Sulfate Rash and Other (See Comments)    Blisters all over body    Medications: Outpatient Encounter Medications as of 02/04/2021  Medication Sig   acetaminophen (TYLENOL) 500 MG tablet Take 500 mg by mouth 2 (two) times a day. FOR BACK PAIN   albuterol (VENTOLIN HFA) 108 (90 Base) MCG/ACT inhaler INHALE 2 PUFFS INTO LUNGS TWICE A DAY AS NEEDED FOR WHEEZKING/SHORTNESS OF BREATH   albuterol (VENTOLIN HFA) 108 (90 Base) MCG/ACT inhaler Inhale 2 puffs into the lungs every 6 (six) hours as needed for wheezing or shortness of breath.   apixaban (ELIQUIS) 5 MG TABS tablet Take 2 tablets (10 mg total) by mouth 2 (two) times daily for 5 days.   [START ON 02/05/2021] apixaban (ELIQUIS) 5 MG TABS tablet Take 1 tablet (5 mg total) by mouth 2 (two) times daily.   Cholecalciferol (VITAMIN D-3) 25 MCG (1000 UT) CAPS Take 1,000 Units by mouth daily.   citalopram (CELEXA) 10 MG tablet Take 1 tablet (10 mg total) by mouth  daily.   citalopram (CELEXA) 10 MG tablet Take 10 mg by mouth daily.   clopidogrel (PLAVIX) 75 MG tablet TAKE 1 TABLET BY MOUTH DAILY.   Cranberry 500 MG TABS Take 500 mg by mouth daily.   Cranberry-Cholecalciferol 4200-500 MG-UNIT CAPS Take 1 capsule by mouth 2 (two) times a day.   docusate sodium (COLACE) 100 MG capsule Take 100 mg by mouth 2 (two) times daily.   famotidine (PEPCID) 20 MG tablet Take 1 tablet (20 mg total) by mouth at bedtime.   famotidine (PEPCID) 20 MG tablet Take 20 mg by mouth at bedtime.   fexofenadine (ALLEGRA) 180 MG tablet Take 180 mg by mouth in the morning.   fluticasone (FLONASE) 50 MCG/ACT nasal spray Place 1 spray into both nostrils daily.   glycerin adult (GLYCERIN ADULT) 2 G SUPP Place 1 suppository rectally daily as needed for mild constipation or moderate constipation.    HYDROcodone-acetaminophen (NORCO/VICODIN) 5-325 MG tablet Take 1 tablet by mouth every 6 (six) hours as needed for severe pain.   methylPREDNISolone (MEDROL DOSEPAK) 4 MG TBPK tablet follow package directions   Misc Natural Products (TOTAL MEMORY & FOCUS FORMULA) TABS Take 1 tablet by mouth 2 (two) times a day.   Multiple Vitamin (MULTIVITAMIN) tablet Take 1 tablet by mouth daily.   Multiple Vitamins-Minerals (PRESERVISION AREDS 2 PO) Take 1 tablet by mouth in the morning and at  bedtime.   Multiple Vitamins-Minerals (PRESERVISION AREDS 2) CAPS Take 1 capsule by mouth 2 (two) times a day.   pantoprazole (PROTONIX) 40 MG tablet Take 1 tablet (40 mg total) by mouth daily.   Probiotic Product ([employer] SENIOR PROBIOTIC) CAPS Take 1 capsule by mouth daily.    Vitamin D, Cholecalciferol, 25 MCG (1000 UT) TABS Take 1,000 mg by mouth daily.   VOLTAREN 1 % GEL APPLY TO AFFECTED AREA EVERY DAY AS NEEDED BACK PAIN (Patient taking differently: Apply 2 g topically daily as needed (back pain). )   No facility-administered encounter medications on file as of 02/04/2021.    Current  Diagnosis: Patient Active Problem List   Diagnosis Date Noted   Closed right hip fracture, initial encounter ([place]) 01/26/2021   History of CVA (cerebrovascular accident)    COVID-19    DNR (do not resuscitate)    Hx of completed stroke 03/31/2019   Vasomotor rhinitis 11/17/2018   Irregular heartbeat 09/21/2017   Mild dementia ([place]) 08/10/2017   Bilateral hearing loss 08/07/2016   Depression 08/07/2016   Vitamin D deficiency 11/04/2015   Routine general medical examination at a health care facility 06/08/2015   CAD 06/21/2010   LOW BACK PAIN, CHRONIC 02/04/2010   ADENOCARCINOMA, COLON 08/19/2007   Hyperlipidemia 11/17/2006   ANEMIA-NOS 11/17/2006   GERD 11/17/2006   OSTEOPENIA 11/17/2006    Goals Addressed   None     Follow-Up:  Pharmacist Review    Have you seen any other providers since your last visit? Spoke with patient daughter [name] who stated that her mother was in the hospital because of a fractured right hip on 12/30/20  Any changes in your medications or health? The daughter stated that in the hospital they put the patient Plavix on hold and that she is now on the Eliquis 5 mg twice a day now  Any side effects from any medications? She states that the patient is not having any side effects from medications  Do you have an symptoms or problems not managed by your medications? Daughter states that the patient is not having any problems or symptoms not managed by medication  Any concerns about your health right now? Daughter states that she does not have any concerns about the patient health at this time. She does want to know if the home health nurse from [facility] can come by the home and draw labs instead of her taking her mother out to have them done.  Has your provider asked that you check blood pressure, blood sugar, or follow special diet at home? Daughter states that patient has not been asked to check blood pressure at home and is not on any  special diet  Do you get any type of exercise on a regular basis? The patient does not exercise  Can you think of a goal you would like to reach for your health? Daughter's goal for patient health is for her to be a one transfer back and forth to appointments  Do you have any problems getting your medications? Daughter states there is not problems with getting medications from the pharmacy  Is there anything that you would like to discuss during the appointment? Daughter states there is nothing specific she would like to discuss at this time. She would like appointment changed to a virtual appointment, since her mother is just getting over covid.  Please bring medications and supplements to appointment   Wendy Poet, [place] [phone]

## 2021-02-05 ENCOUNTER — Ambulatory Visit (INDEPENDENT_AMBULATORY_CARE_PROVIDER_SITE_OTHER): Payer: Medicare Other | Admitting: Pharmacist

## 2021-02-05 ENCOUNTER — Other Ambulatory Visit: Payer: Self-pay

## 2021-02-05 DIAGNOSIS — U071 COVID-19: Secondary | ICD-10-CM | POA: Diagnosis not present

## 2021-02-05 DIAGNOSIS — I82441 Acute embolism and thrombosis of right tibial vein: Secondary | ICD-10-CM

## 2021-02-05 DIAGNOSIS — F325 Major depressive disorder, single episode, in full remission: Secondary | ICD-10-CM

## 2021-02-05 DIAGNOSIS — I517 Cardiomegaly: Secondary | ICD-10-CM | POA: Diagnosis not present

## 2021-02-05 DIAGNOSIS — E785 Hyperlipidemia, unspecified: Secondary | ICD-10-CM | POA: Diagnosis not present

## 2021-02-05 DIAGNOSIS — S72141D Displaced intertrochanteric fracture of right femur, subsequent encounter for closed fracture with routine healing: Secondary | ICD-10-CM | POA: Diagnosis not present

## 2021-02-05 DIAGNOSIS — D509 Iron deficiency anemia, unspecified: Secondary | ICD-10-CM | POA: Diagnosis not present

## 2021-02-05 DIAGNOSIS — Z8673 Personal history of transient ischemic attack (TIA), and cerebral infarction without residual deficits: Secondary | ICD-10-CM

## 2021-02-05 DIAGNOSIS — F0150 Vascular dementia without behavioral disturbance: Secondary | ICD-10-CM | POA: Diagnosis not present

## 2021-02-05 DIAGNOSIS — K219 Gastro-esophageal reflux disease without esophagitis: Secondary | ICD-10-CM

## 2021-02-05 NOTE — Progress Notes (Signed)
Chronic Care Management Pharmacy Note  02/05/2021 Name:  [name] Barker MRN:  [id number] DOB:  10-02-1921  Subjective: [name] Barker is an 87 y.o. year old female who is a primary patient of Hoyt Koch, MD.  The CCM team was consulted for assistance with disease management and care coordination needs.    Engaged with patient by telephone for initial visit in response to provider referral for pharmacy case management and/or care coordination services.   Consent to Services:  The patient was given the following information about Chronic Care Management services today, agreed to services, and gave verbal consent: 1. CCM service includes personalized support from designated clinical staff supervised by the primary care provider, including individualized plan of care and coordination with other care providers 2. 24/7 contact phone numbers for assistance for urgent and routine care needs. 3. Service will only be billed when office clinical staff spend 20 minutes or more in a month to coordinate care. 4. Only one practitioner may furnish and bill the service in a calendar month. 5.The patient may stop CCM services at any time (effective at the end of the month) by phone call to the office staff. 6. The patient will be responsible for cost sharing (co-pay) of up to 20% of the service fee (after annual deductible is met). Patient agreed to services and consent obtained.  Patient Care Team: Hoyt Koch, MD as PCP - General (Internal Medicine) Ladell Pier, MD as Consulting Physician (Oncology) Ladene Artist, MD as Consulting Physician (Gastroenterology) Charlton Haws, [facility] as Pharmacist (Pharmacist) Hoyt Koch, MD (Internal Medicine)  Recent office visits: 07/25/20 Dr Sharlet Salina VV: f/u dementia, GERD, hx stroke. BP at goal off meds. Dementia worsening but declines medication, Continue celexa.  Recent consult visits: 01/26/21-01/30/21 hospitalization:  mechanical fall w/ R hip fracture, post ORIF. Subacute DVT in R leg, rx' d Eliquis for tx. COVID 19 infection s/p 3 days remdesivir, rx'd medrol dosepak. Hx CVA - clopidogrel switched to Eliquis.  01/21/21 Dr Lyla Glassing (ortho surgery): f/u knee osteoarthritis  Objective:  Lab Results  Component Value Date   CREATININE 0.53 01/30/2021   BUN 27 (H) 01/30/2021   GFR 75.20 02/12/2018   GFRNONAA >60 01/30/2021   GFRAA >60 04/20/2019   NA 137 01/30/2021   K 3.8 01/30/2021   CALCIUM 8.3 (L) 01/30/2021   CO2 29 01/30/2021    Lab Results  Component Value Date/Time   HGBA1C 5.3 04/01/2019 03:12 AM   HGBA1C 5.8 (H) 04/04/2012 01:42 PM   GFR 75.20 02/12/2018 03:54 PM   GFR 119.61 05/19/2016 11:39 AM    Last diabetic Eye exam: No results found for: HMDIABEYEEXA  Last diabetic Foot exam: No results found for: HMDIABFOOTEX   Lab Results  Component Value Date   CHOL 194 04/01/2019   HDL 42 04/01/2019   LDLCALC 137 (H) 04/01/2019   LDLDIRECT 133.6 09/26/2013   TRIG 75 04/01/2019   CHOLHDL 4.6 04/01/2019    Hepatic Function Latest Ref Rng & Units 01/28/2021 01/27/2021 04/20/2019  Total Protein 6.5 - 8.1 g/dL 6.1(L) 6.7 6.4(L)  Albumin 3.5 - 5.0 g/dL 2.9(L) 3.3(L) 3.4(L)  AST 15 - 41 U/L $Remo'29 27 24  'reSIe$ ALT 0 - 44 U/L $Remo'14 15 13  'iqTmj$ Alk Phosphatase 38 - 126 U/L 43 57 75  Total Bilirubin 0.3 - 1.2 mg/dL 0.4 0.2(L) 0.6  Bilirubin, Direct 0.0 - 0.3 mg/dL - - -    Lab Results  Component Value Date/Time   TSH  3.50 02/12/2018 03:54 PM   TSH 3.00 03/19/2016 03:23 PM   FREET4 0.65 02/12/2018 03:54 PM    CBC Latest Ref Rng & Units 01/30/2021 01/29/2021 01/29/2021  WBC 4.0 - 10.5 K/uL 11.9(H) - 11.9(H)  Hemoglobin 12.0 - 15.0 g/dL 8.7(L) 8.7(L) 6.8(LL)  Hematocrit 36.0 - 46.0 % 27.4(L) 26.2(L) 20.9(L)  Platelets 150 - 400 K/uL 237 - 207    Lab Results  Component Value Date/Time   VD25OH 50.12 02/12/2018 03:54 PM   VD25OH 23.94 (L) 11/02/2015 02:44 PM    Clinical ASCVD: Yes  The ASCVD Risk score  Mikey Bussing DC Jr., et al., 2013) failed to calculate for the following reasons:   The 2013 ASCVD risk score is only valid for ages 1 to 63    Depression screen PHQ 2/9 07/27/2020 08/04/2017 11/02/2015  Decreased Interest 0 0 0  Down, Depressed, Hopeless 0 1 0  PHQ - 2 Score 0 1 0  Altered sleeping - 0 -  Tired, decreased energy - 1 -  Change in appetite - 0 -  Feeling bad or failure about yourself  - 0 -  Trouble concentrating - 0 -  Moving slowly or fidgety/restless - 0 -  Suicidal thoughts - 0 -  PHQ-9 Score - 2 -  Difficult doing work/chores - Not difficult at all -  Some recent data might be hidden     Social History   Tobacco Use  Smoking Status Never Smoker  Smokeless Tobacco Never Used   BP Readings from Last 3 Encounters:  01/30/21 (!) 152/49  04/20/19 (!) 163/66  04/01/19 140/90   Pulse Readings from Last 3 Encounters:  01/30/21 72  04/20/19 (!) 57  04/01/19 (!) 58   Wt Readings from Last 3 Encounters:  01/26/21 140 lb (63.5 kg)  03/31/19 121 lb 4.1 oz (55 kg)  11/17/18 112 lb (50.8 kg)    Assessment/Interventions: Review of patient past medical history, allergies, medications, health status, including review of consultants reports, laboratory and other test data, was performed as part of comprehensive evaluation and provision of chronic care management services.   SDOH:  (Social Determinants of Health) assessments and interventions performed: Yes SDOH Interventions   Flowsheet Row Most Recent Value  SDOH Interventions   Financial Strain Interventions Other (Comment)  [pursing Eliquis PAP]      CCM Care Plan  Allergies  Allergen Reactions  . Morphine And Related Other (See Comments)    Blisters at site and along vein  . Flexeril [Cyclobenzaprine] Other (See Comments)    Nightmares   . Methenamine Mandelate Other (See Comments)    Burning sensation  . Penicillins Hives    Did it involve swelling of the face/tongue/throat, SOB, or low BP? Yes Did it  involve sudden or severe rash/hives, skin peeling, or any reaction on the inside of your mouth or nose? Unk Did you need to seek medical attention at a hospital or doctor's office? Unk When did it last happen? Childhood If all above answers are "NO", may proceed with cephalosporin use.   . Promethazine Hcl Other (See Comments)    Hallucinations  . Statins Other (See Comments)    Muscle pain   . Ativan [Lorazepam] Anxiety  . Morphine Sulfate Rash and Other (See Comments)    Blisters all over body    Medications Reviewed Today    Reviewed by Charlton Haws, [facility] (Pharmacist) on 02/05/21 at 1451  Med List Status: <None>  Medication Order Taking? Sig Documenting Provider Last Dose  Status Informant  acetaminophen (TYLENOL) 500 MG tablet [id number] Yes Take 500 mg by mouth 2 (two) times a day. FOR BACK PAIN [provider] Taking Active Family Member  albuterol (VENTOLIN HFA) 108 (90 Base) MCG/ACT inhaler [id number] Yes Inhale 2 puffs into the lungs every 6 (six) hours as needed for wheezing or shortness of breath. Thurnell Lose, MD Taking Active   apixaban (ELIQUIS) 5 MG TABS tablet [id number] Yes Take 1 tablet (5 mg total) by mouth 2 (two) times daily. Thurnell Lose, MD Taking Active   Cholecalciferol (VITAMIN D-3) 25 MCG (1000 UT) CAPS [id number] Yes Take 1,000 Units by mouth daily. [provider] Taking Active Family Member  citalopram (CELEXA) 10 MG tablet [id number] Yes Take 1 tablet (10 mg total) by mouth daily. Hoyt Koch, MD Taking Active   clopidogrel (PLAVIX) 75 MG tablet [id number] No TAKE 1 TABLET BY MOUTH DAILY.  Patient not taking: No sig reported   Hoyt Koch, MD Not Taking Active            Med Note Charlton Haws   Tue Feb 05, 2021  2:50 PM) ON HOLD while patient is taking Eliquis for DVT (starting 01/29/21)  Cranberry 500 MG TABS [id number] Yes Take 500 mg by mouth daily. [provider] Taking Active Child        Patient not taking:      Discontinued 24/09/73 5329 (Duplicate)   famotidine (PEPCID) 20 MG tablet [id number] Yes Take 1 tablet (20 mg total) by mouth at bedtime. Hoyt Koch, MD Taking Active   fexofenadine [facility]) 180 MG tablet [id number] Yes Take 180 mg by mouth in the morning. [provider] Taking Active Child  fluticasone (FLONASE) 50 MCG/ACT nasal spray [id number] Yes Place 1 spray into both nostrils daily. [provider] Taking Active Child       Patient not taking:      Discontinued 02/05/21 1450 (Patient Preference)   Multiple Vitamin (MULTIVITAMIN) tablet [id number] Yes Take 1 tablet by mouth daily. [provider] Taking Active Child  Multiple Vitamins-Minerals (PRESERVISION AREDS 2 PO) [id number] Yes Take 1 tablet by mouth in the morning and at bedtime. [provider] Taking Active Child  Vitamin D, Cholecalciferol, 25 MCG (1000 UT) TABS [id number] Yes Take 1,000 mg by mouth daily. [provider] Taking Active Child          Patient Active Problem List   Diagnosis Date Noted  . Closed right hip fracture, initial encounter ([place]) 01/26/2021  . History of CVA (cerebrovascular accident)   . COVID-19   . DNR (do not resuscitate)   . Hx of completed stroke 03/31/2019  . Vasomotor rhinitis 11/17/2018  . Irregular heartbeat 09/21/2017  . Mild dementia ([place]) 08/10/2017  . Bilateral hearing loss 08/07/2016  . Depression 08/07/2016  . Vitamin D deficiency 11/04/2015  . Routine general medical examination at a health care facility 06/08/2015  . CAD 06/21/2010  . LOW BACK PAIN, CHRONIC 02/04/2010  . ADENOCARCINOMA, COLON 08/19/2007  . Hyperlipidemia 11/17/2006  . ANEMIA-NOS 11/17/2006  . GERD 11/17/2006  . OSTEOPENIA 11/17/2006    Immunization History  Administered Date(s) Administered  . Influenza Split 09/29/2011  . Influenza Whole 10/03/2008, 09/28/2012  . Influenza, High Dose Seasonal PF 09/14/2018  .  Influenza,inj,Quad PF,6+ Mos 09/12/2013  . Influenza,trivalent, recombinat, inj, PF 09/26/2013  . Influenza-Unspecified 09/28/2014, 10/13/2015, 09/25/2017, 09/14/2018, 09/24/2019  . PFIZER(Purple Top)SARS-COV-2 Vaccination 02/26/2020, 03/21/2020  . Pneumococcal Conjugate-13 06/07/2015  . Pneumococcal Polysaccharide-23  10/29/2006  . Td 12/29/1994  . Tdap 07/18/2011  . Zoster 08/25/2011    Conditions to be addressed/monitored:  Hyperlipidemia, GERD, Depression, Allergic Rhinitis and DVT  Care Plan : [place]  Updates made by Charlton Haws, [place] since 02/05/2021 12:00 AM    Problem: Hyperlipidemia, GERD, Depression, Allergic Rhinitis and DVT   Priority: High    Goal: Disease Management   Start Date: 02/05/2021  Expected End Date: 08/05/2021  This Visit's Progress: On track  Priority: High  Note:   Current Barriers:  . Unable to independently afford treatment regimen . Unable to independently monitor therapeutic efficacy  Pharmacist Clinical Goal(s):  Marland Kitchen Over the next 180 days, patient will verbalize ability to afford treatment regimen . achieve adherence to monitoring guidelines and medication adherence to achieve therapeutic efficacy through collaboration with PharmD and provider.   Interventions: . 1:1 collaboration with Hoyt Koch, MD regarding development and update of comprehensive plan of care as evidenced by provider attestation and co-signature . Inter-disciplinary care team collaboration (see longitudinal plan of care) . Comprehensive medication review performed; medication list updated in electronic medical record  Hyperlipidemia / hx CVA (LDL goal < 70) -uncontrolled -Current treatment: . None  -Medications previously tried: clopidogrel, "statns" -Educated on history of CVA, role of cholesterol in stroke; pt has not tolerated statins in the past and would not like to start one at age 15 -Recommended to continue control with  diet  Depression/Anxiety (Goal: manage symptoms) -controlled -Current treatment: . Citalopram 10 mg daily -Medications previously tried/failed: none -PHQ9: 0 -GAD7: not on file -Connected with PCP for mental health support -Educated on Benefits of medication for symptom control -Recommended to continue current medication  Subacute DVT (Dx 01/27/21) (Goal: prevent subsequent DVT) -controlled  -DVT diagnosed in hospitalization for hip fracture -Current treatment  . Eliquis 5 mg BID -Medications previously tried: none -pt has completed first week of Eliquis 10 mg BID, and has transitioned to 5 mg BID; denies bleeding or excessive bruising -discussed length of treatment for DVT is usually 6 months; at 6 month mark can discuss with providers whether to continue treatment or not -Recommended to continue current medication Assessed patient finances. No drug plan, so will pursue patient assistance for Eliquis    GERD (Goal: manage symptoms) -controlled -Current treatment  . Famotidine 20 mg HS -Medications previously tried: none  -Recommended to continue current medication  Back pain (Goal: manage symptoms) -controlled -Current treatment  . Tylenol 500 mg BID -Medications previously tried: hydrocodone -Recommended to continue current medication  Health Maintenance -Current therapy:  . Albuterol HFA prn . Vitamin D 1000 IU daily . Cranberry 500 mg daily BID . Fexofenadine 180 mg daily AM . Fluticasone nasal spray BID . Multivitamin . Preservision -Patient is satisfied with current therapy and denies issues -Recommended to continue current medication  Patient Goals/Self-Care Activities . Over the next 180 days, patient will:  - take medications as prescribed collaborate with provider on medication access solutions  Follow Up Plan: Telephone follow up appointment with care management team member scheduled for: 6 months      Medication Assistance: Application for Eliquis   medication assistance program. in process.  Anticipated assistance start date 03/05/21.  See plan of care for additional detail.  Patient's preferred pharmacy is:  [facility] [address], [place] - [address] [place] [place] [address] Phone: [phone] Fax: [phone]  Uses pill box? Yes Pt endorses 100% compliance  We discussed:  Current pharmacy is preferred with insurance plan and patient is satisfied with pharmacy services Patient decided to: Continue current medication management strategy  Follow Up:  Patient agrees to Care Plan and Follow-up.  Plan: Telephone follow up appointment with care management team member scheduled for:  6 months  Charlene Brooke, PharmD, [facility] Clinical Pharmacist [facility] [phone]

## 2021-02-05 NOTE — Patient Instructions (Addendum)
Visit Information  Phone number for Pharmacist: [phone]  Thank you for meeting with me to discuss your medications! I look forward to working with you to achieve your health care goals. Below is a summary of what we talked about during the visit:  Goals Addressed            This Visit's Progress   . Manage My Medicine       Timeframe:  Long-Range Goal Priority:  High Start Date:      02/05/21                       Expected End Date:     07/3121                  Follow Up Date 08/28/21   - call for medicine refill 2 or 3 days before it runs out - call if I am sick and can't take my medicine - keep a list of all the medicines I take; vitamins and herbals too - use a pillbox to sort medicine  -Work with PCP office for Eliquis patient assistance   Why is this important?   . These steps will help you keep on track with your medicines.       Patient Care Plan: CCM Pharmacy Care Plan    Problem Identified: Hyperlipidemia, GERD, Depression, Allergic Rhinitis and DVT   Priority: High    Goal: Disease Management   Start Date: 02/05/2021  Expected End Date: 08/05/2021  This Visit's Progress: On track  Priority: High  Note:   Current Barriers:  . Unable to independently afford treatment regimen . Unable to independently monitor therapeutic efficacy  Pharmacist Clinical Goal(s):  Marland Kitchen Over the next 180 days, patient will verbalize ability to afford treatment regimen . achieve adherence to monitoring guidelines and medication adherence to achieve therapeutic efficacy through collaboration with PharmD and provider.   Interventions: . 1:1 collaboration with Hoyt Koch, MD regarding development and update of comprehensive plan of care as evidenced by provider attestation and co-signature . Inter-disciplinary care team collaboration (see longitudinal plan of care) . Comprehensive medication review performed; medication list updated in electronic medical  record  Hyperlipidemia / hx CVA (LDL goal < 70) -uncontrolled -Current treatment: . None  -Medications previously tried: clopidogrel, "statns" -Educated on history of CVA, role of cholesterol in stroke; pt has not tolerated statins in the past and would not like to start one at age 52 -Recommended to continue control with diet  Depression/Anxiety (Goal: manage symptoms) -controlled -Current treatment: . Citalopram 10 mg daily -Medications previously tried/failed: none -PHQ9: 0 -GAD7: not on file -Connected with PCP for mental health support -Educated on Benefits of medication for symptom control -Recommended to continue current medication  Subacute DVT (Dx 01/27/21) (Goal: prevent subsequent DVT) -controlled  -DVT diagnosed in hospitalization for hip fracture -Current treatment  . Eliquis 5 mg BID -Medications previously tried: none -pt has completed first week of Eliquis 10 mg BID, and has transitioned to 5 mg BID; denies bleeding or excessive bruising -discussed length of treatment for DVT is usually 6 months; at 6 month mark can discuss with providers whether to continue treatment or not -Recommended to continue current medication Assessed patient finances. No drug plan, so will pursue patient assistance for Eliquis    GERD (Goal: manage symptoms) -controlled -Current treatment  . Famotidine 20 mg HS -Medications previously tried: none  -Recommended to continue current medication  Back pain (Goal: manage  symptoms) -controlled -Current treatment  . Tylenol 500 mg BID -Medications previously tried: hydrocodone -Recommended to continue current medication  Health Maintenance -Current therapy:  . Albuterol HFA prn . Vitamin D 1000 IU daily . Cranberry 500 mg daily BID . Fexofenadine 180 mg daily AM . Fluticasone nasal spray BID . Multivitamin . Preservision -Patient is satisfied with current therapy and denies issues -Recommended to continue current  medication  Patient Goals/Self-Care Activities . Over the next 180 days, patient will:  - take medications as prescribed collaborate with provider on medication access solutions  Follow Up Plan: Telephone follow up appointment with care management team member scheduled for: 6 months      Ms. Lieske was given information about Chronic Care Management services today including:  1. CCM service includes personalized support from designated clinical staff supervised by her physician, including individualized plan of care and coordination with other care providers 2. 24/7 contact phone numbers for assistance for urgent and routine care needs. 3. Standard insurance, coinsurance, copays and deductibles apply for chronic care management only during months in which we provide at least 20 minutes of these services. Most insurances cover these services at 100%, however patients may be responsible for any copay, coinsurance and/or deductible if applicable. This service may help you avoid the need for more expensive face-to-face services. 4. Only one practitioner may furnish and bill the service in a calendar month. 5. The patient may stop CCM services at any time (effective at the end of the month) by phone call to the office staff.  Patient agreed to services and verbal consent obtained.   The patient verbalized understanding of instructions, educational materials, and care plan provided today and agreed to receive a mailed copy of patient instructions, educational materials, and care plan.  Telephone follow up appointment with pharmacy team member scheduled for: 6 months  Charlene Brooke, PharmD, BCACP Clinical Pharmacist [facility], [place] [phone]  Deep Vein Thrombosis  Deep vein thrombosis (DVT) is a condition in which a blood clot forms in a deep vein, such as a vein in the lower leg, thigh, pelvis, or arm. Deep veins are veins in the deep venous system. A clot is  blood that has thickened into a gel or solid. This condition is serious and can be life-threatening if the clot travels to the lungs and causes a blockage (pulmonary embolism) in the arteries of the lung. A DVT can also damage veins in the leg. This can lead to long-term, or chronic, venous disease, leg pain, swelling, discoloration, and ulcers or sores (post-thrombotic syndrome). What are the causes? This condition may be caused by:  A slowdown of blood flow.  Damage to a vein.  A condition that causes blood to clot more easily, such as certain blood-clotting disorders. What increases the risk? The following factors may make you more likely to develop this condition:  Having obesity.  Being older, especially older than age 94.  Being inactive (sedentary lifestyle) or not moving around. This may include: ? Sitting or lying down for longer than 4-6 hours other than to sleep at night. ? Being in the hospital, having major or lengthy surgery, or having a thin, flexible tube (central line catheter) placed in a large vein.  Being pregnant, giving birth, or having recently given birth.  Taking medicines that contain estrogen, such as birth control or hormone replacement therapy.  Using products that contain nicotine or tobacco, especially if you use hormonal birth control.  Having a  history of blood clots or a blood-clotting disease, a blood vessel disease (peripheral vascular disease), or congestive heart disease.  Having a history of cancer, especially if being treated with chemotherapy. What are the signs or symptoms? Symptoms of this condition include:  Swelling, pain, pressure, or tenderness in an arm or a leg.  An arm or a leg becoming warm, red, or discolored.  A leg turning very pale. You may have a large DVT. This is rare. If the clot is in your leg, you may notice symptoms more or have worse symptoms when you stand or walk. In some cases, there are no symptoms. How is this  diagnosed? This condition is diagnosed with:  Your medical history and a physical exam.  Tests, such as: ? Blood tests to check how well your blood clots. ? Doppler ultrasound. This is the best way to find a DVT. ? Venogram. Contrast dye is injected into a vein, and X-rays are taken to check for clots. How is this treated? Treatment for this condition depends on:  The cause of your DVT.  The size and location of your DVT, or having more than one DVT.  Your risk for bleeding or developing more clots.  Other medical conditions you may have. Treatment may include:  Taking a blood thinner, also called an anticoagulant, to prevent clots from forming and growing.  Wearing compression stockings, if directed.  Injecting medicines into the affected vein to break up the clot (catheter-directed thrombolysis). This is used only for severe DVT and only if a specialist recommends it.  Specific surgical procedures, when DVT is severe or hard to treat. These may be done to: ? Isolate and remove your clot. ? Place an inferior vena cava (IVC) filter in a large vein to catch blood clots before they reach your lungs. You may get some medical treatments for 6 months or longer. Follow these instructions at home: If you are taking blood thinners:  Talk with your health care provider before you take any medicines that contain aspirin or NSAIDs, such as ibuprofen. These medicines increase your risk for dangerous bleeding.  Take your medicine exactly as told, at the same time every day. Do not skip a dose. Do not take more than the prescribed dose. This is important.  Ask your health care provider about foods and medicines that could change the way your blood thinner works (may interact). Avoid these foods and medicines if you are told to do so.  Avoid anything that may cause bleeding or bruising. You may bleed more easily while taking blood thinners. ? Be very careful when using knives, scissors, or  other sharp objects. ? Use an electric razor instead of a blade. ? Avoid activities that could cause injury or bruising, and follow instructions for preventing falls. ? Tell your health care provider if you have had any internal bleeding, bleeding ulcers, or neurologic diseases, such as strokes or cerebral aneurysms.  Wear a medical alert bracelet or carry a card that lists what medicines you take. General instructions  Take over-the-counter and prescription medicines only as told by your health care provider.  Return to your normal activities as told by your health care provider. Ask your health care provider what activities are safe for you.  If recommended, wear compression stockings as told by your health care provider. These stockings help to prevent blood clots and reduce swelling in your legs.  Keep all follow-up visits as told by your health care provider. This is important.  Contact a health care provider if:  You miss a dose of your blood thinner.  You have new or worse pain, swelling, or redness in an arm or a leg.  You have worsening numbness or tingling in an arm or a leg.  You have unusual bruising. Get help right away if:  You have signs or symptoms that a blood clot has moved to the lungs. These may include: ? Shortness of breath. ? Chest pain. ? Fast or irregular heartbeats (palpitations). ? Light-headedness or dizziness. ? Coughing up blood.  You have signs or symptoms that your blood is too thin. These may include: ? Blood in your vomit, stool, or urine. ? A cut that will not stop bleeding. ? A menstrual period that is heavier than usual. ? A severe headache or confusion. These symptoms may represent a serious problem that is an emergency. Do not wait to see if the symptoms will go away. Get medical help right away. Call your local emergency services (911 in the [place] Do not drive yourself to the hospital. Summary  Deep vein thrombosis (DVT) happens when a  blood clot forms in a deep vein. This may occur in the lower leg, thigh, pelvis, or arm.  Symptoms affect the arm or leg and can include swelling, pain, tenderness, warmth, redness, or discoloration.  This condition may be treated with medicines or compression stockings. In severe cases, surgery may be done.  If you are taking blood thinners, take them exactly as told. Do not skip a dose. Do not take more than is prescribed.  Get help right away if you have shortness of breath, chest pain, fast or irregular heartbeats, or blood in your vomit, urine, or stool. This information is not intended to replace advice given to you by your health care provider. Make sure you discuss any questions you have with your health care provider. Document Revised: 12/10/2019 Document Reviewed: 12/10/2019 Elsevier Patient Education  2021 [employer].

## 2021-02-06 ENCOUNTER — Telehealth (HOSPITAL_COMMUNITY): Payer: Self-pay | Admitting: Pharmacist

## 2021-02-06 DIAGNOSIS — D509 Iron deficiency anemia, unspecified: Secondary | ICD-10-CM | POA: Diagnosis not present

## 2021-02-06 DIAGNOSIS — I517 Cardiomegaly: Secondary | ICD-10-CM | POA: Diagnosis not present

## 2021-02-06 DIAGNOSIS — U071 COVID-19: Secondary | ICD-10-CM | POA: Diagnosis not present

## 2021-02-06 DIAGNOSIS — S72141D Displaced intertrochanteric fracture of right femur, subsequent encounter for closed fracture with routine healing: Secondary | ICD-10-CM | POA: Diagnosis not present

## 2021-02-06 DIAGNOSIS — F0150 Vascular dementia without behavioral disturbance: Secondary | ICD-10-CM | POA: Diagnosis not present

## 2021-02-06 DIAGNOSIS — E785 Hyperlipidemia, unspecified: Secondary | ICD-10-CM | POA: Diagnosis not present

## 2021-02-06 NOTE — Telephone Encounter (Signed)
Pharmacy Transitions of Care Follow-up Telephone Call  Date of discharge: 01/30/21   Medication changes made at discharge: Yes, change from clopidogrel to Eliquis.  Medication changes obtained and verified? Yes    Medication Accessibility:  Home Pharmacy: [name] [address]  Was the patient provided with refills on discharged medications? Yes  Have all prescriptions been transferred from [facility] to home pharmacy? I transferred the one refill she had on Eliquis to the [facility] on [address].  [name] Barker, the patient's daughter is aware of this.  Is the patient able to afford medications? Probably not . Notable copays: She has no Part D plan.  I see that [name] Barker is applying for manufacturers assistance for Eliquis.  Thank you!     Medication Review:  On 02/04/2021, I was able to speak to [name] Barker, the patient's daughter and we reviewed the following regarding Eliquis.  [name] Barker is very involved in her mother's care.  Apixaban 10 mg BID was inititated and switched to 5mg  bid on 02/04/2021.  - Discussed importance of taking medication around the same time everyday  - Reviewed potential DDIs with patient  - Advised patient of medications to avoid (NSAIDs, ASA)  - Educated that Tylenol (acetaminophen) will be the preferred analgesic to prevent risk of bleeding  - Emphasized importance of monitoring for signs and symptoms of bleeding (abnormal bruising, prolonged bleeding, nose bleeds, bleeding from gums, discolored urine, black tarry stools)   [name] Barker understood all of the above.    Follow-up Appointments:  I see that [name] Barker made her appointment on 2/8 with Dr. Sharlet Salina and Gayland Curry   If their condition worsens,  the pt aware to call PCP or go to the Emergency Dept.

## 2021-02-07 ENCOUNTER — Telehealth: Payer: Self-pay | Admitting: Internal Medicine

## 2021-02-07 DIAGNOSIS — I517 Cardiomegaly: Secondary | ICD-10-CM | POA: Diagnosis not present

## 2021-02-07 DIAGNOSIS — S72141D Displaced intertrochanteric fracture of right femur, subsequent encounter for closed fracture with routine healing: Secondary | ICD-10-CM | POA: Diagnosis not present

## 2021-02-07 DIAGNOSIS — F0150 Vascular dementia without behavioral disturbance: Secondary | ICD-10-CM | POA: Diagnosis not present

## 2021-02-07 DIAGNOSIS — D509 Iron deficiency anemia, unspecified: Secondary | ICD-10-CM | POA: Diagnosis not present

## 2021-02-07 DIAGNOSIS — E785 Hyperlipidemia, unspecified: Secondary | ICD-10-CM | POA: Diagnosis not present

## 2021-02-07 DIAGNOSIS — U071 COVID-19: Secondary | ICD-10-CM | POA: Diagnosis not present

## 2021-02-07 NOTE — Telephone Encounter (Signed)
Her orthopedic who did recent hip fracture surgery should be able to sign off on this. If we are needed to sign off she would need video visit within 30 days of hospital discharge per medicare rules.

## 2021-02-07 NOTE — Telephone Encounter (Signed)
   [place] Name: [facility] Agency Name: [facility] Phone #: [phone] Service Requested: OT Frequency of Visits: 1W1, [phone]

## 2021-02-07 NOTE — Telephone Encounter (Signed)
See below

## 2021-02-08 NOTE — Telephone Encounter (Signed)
Spoke with Timmothy Sours and he verbalized understanding. No other questions or concerns at this time.

## 2021-02-12 ENCOUNTER — Telehealth: Payer: Self-pay | Admitting: Internal Medicine

## 2021-02-12 DIAGNOSIS — D509 Iron deficiency anemia, unspecified: Secondary | ICD-10-CM | POA: Diagnosis not present

## 2021-02-12 DIAGNOSIS — F0150 Vascular dementia without behavioral disturbance: Secondary | ICD-10-CM | POA: Diagnosis not present

## 2021-02-12 DIAGNOSIS — E785 Hyperlipidemia, unspecified: Secondary | ICD-10-CM | POA: Diagnosis not present

## 2021-02-12 DIAGNOSIS — S72141D Displaced intertrochanteric fracture of right femur, subsequent encounter for closed fracture with routine healing: Secondary | ICD-10-CM | POA: Diagnosis not present

## 2021-02-12 DIAGNOSIS — I517 Cardiomegaly: Secondary | ICD-10-CM | POA: Diagnosis not present

## 2021-02-12 DIAGNOSIS — U071 COVID-19: Secondary | ICD-10-CM | POA: Diagnosis not present

## 2021-02-12 NOTE — Telephone Encounter (Signed)
Okay to do CBC and CMP at home, no other labs needed.

## 2021-02-12 NOTE — Telephone Encounter (Signed)
See below

## 2021-02-12 NOTE — Telephone Encounter (Signed)
[name] Barker called and said that the patient is needing CBC and BMP per her discharge but the patient recently broke her hip and was wondering if the labs could be drawn in the patients home. She was also wondering if there were any other labs that need to be drawn. Please advise

## 2021-02-13 DIAGNOSIS — S72141D Displaced intertrochanteric fracture of right femur, subsequent encounter for closed fracture with routine healing: Secondary | ICD-10-CM | POA: Diagnosis not present

## 2021-02-13 DIAGNOSIS — F0150 Vascular dementia without behavioral disturbance: Secondary | ICD-10-CM | POA: Diagnosis not present

## 2021-02-13 DIAGNOSIS — D509 Iron deficiency anemia, unspecified: Secondary | ICD-10-CM | POA: Diagnosis not present

## 2021-02-13 DIAGNOSIS — U071 COVID-19: Secondary | ICD-10-CM | POA: Diagnosis not present

## 2021-02-13 DIAGNOSIS — I517 Cardiomegaly: Secondary | ICD-10-CM | POA: Diagnosis not present

## 2021-02-13 DIAGNOSIS — E785 Hyperlipidemia, unspecified: Secondary | ICD-10-CM | POA: Diagnosis not present

## 2021-02-14 NOTE — Telephone Encounter (Signed)
Spoke with [name] Barker to give her labs orders from Dr. Sharlet Salina. No other questions or concerns at this time.

## 2021-02-18 DIAGNOSIS — S72141D Displaced intertrochanteric fracture of right femur, subsequent encounter for closed fracture with routine healing: Secondary | ICD-10-CM | POA: Diagnosis not present

## 2021-02-18 DIAGNOSIS — F0150 Vascular dementia without behavioral disturbance: Secondary | ICD-10-CM | POA: Diagnosis not present

## 2021-02-18 DIAGNOSIS — U071 COVID-19: Secondary | ICD-10-CM | POA: Diagnosis not present

## 2021-02-18 DIAGNOSIS — I517 Cardiomegaly: Secondary | ICD-10-CM | POA: Diagnosis not present

## 2021-02-18 DIAGNOSIS — E785 Hyperlipidemia, unspecified: Secondary | ICD-10-CM | POA: Diagnosis not present

## 2021-02-18 DIAGNOSIS — D509 Iron deficiency anemia, unspecified: Secondary | ICD-10-CM | POA: Diagnosis not present

## 2021-02-19 DIAGNOSIS — U071 COVID-19: Secondary | ICD-10-CM | POA: Diagnosis not present

## 2021-02-19 DIAGNOSIS — E785 Hyperlipidemia, unspecified: Secondary | ICD-10-CM | POA: Diagnosis not present

## 2021-02-19 DIAGNOSIS — I517 Cardiomegaly: Secondary | ICD-10-CM | POA: Diagnosis not present

## 2021-02-19 DIAGNOSIS — F0150 Vascular dementia without behavioral disturbance: Secondary | ICD-10-CM | POA: Diagnosis not present

## 2021-02-19 DIAGNOSIS — S72141D Displaced intertrochanteric fracture of right femur, subsequent encounter for closed fracture with routine healing: Secondary | ICD-10-CM | POA: Diagnosis not present

## 2021-02-19 DIAGNOSIS — D509 Iron deficiency anemia, unspecified: Secondary | ICD-10-CM | POA: Diagnosis not present

## 2021-02-19 NOTE — Telephone Encounter (Signed)
Just a FYI

## 2021-02-19 NOTE — Telephone Encounter (Signed)
[name] Barker calling states she tried to get the blood today but she was unable to because she was a hard stick

## 2021-02-21 DIAGNOSIS — F0150 Vascular dementia without behavioral disturbance: Secondary | ICD-10-CM | POA: Diagnosis not present

## 2021-02-21 DIAGNOSIS — E785 Hyperlipidemia, unspecified: Secondary | ICD-10-CM | POA: Diagnosis not present

## 2021-02-21 DIAGNOSIS — I517 Cardiomegaly: Secondary | ICD-10-CM | POA: Diagnosis not present

## 2021-02-21 DIAGNOSIS — S72141D Displaced intertrochanteric fracture of right femur, subsequent encounter for closed fracture with routine healing: Secondary | ICD-10-CM | POA: Diagnosis not present

## 2021-02-21 DIAGNOSIS — D509 Iron deficiency anemia, unspecified: Secondary | ICD-10-CM | POA: Diagnosis not present

## 2021-02-21 DIAGNOSIS — U071 COVID-19: Secondary | ICD-10-CM | POA: Diagnosis not present

## 2021-02-25 ENCOUNTER — Encounter: Payer: Self-pay | Admitting: Internal Medicine

## 2021-02-25 DIAGNOSIS — F0150 Vascular dementia without behavioral disturbance: Secondary | ICD-10-CM | POA: Diagnosis not present

## 2021-02-25 DIAGNOSIS — U071 COVID-19: Secondary | ICD-10-CM | POA: Diagnosis not present

## 2021-02-25 DIAGNOSIS — D509 Iron deficiency anemia, unspecified: Secondary | ICD-10-CM | POA: Diagnosis not present

## 2021-02-25 DIAGNOSIS — I517 Cardiomegaly: Secondary | ICD-10-CM | POA: Diagnosis not present

## 2021-02-25 DIAGNOSIS — S72141D Displaced intertrochanteric fracture of right femur, subsequent encounter for closed fracture with routine healing: Secondary | ICD-10-CM | POA: Diagnosis not present

## 2021-02-25 DIAGNOSIS — E785 Hyperlipidemia, unspecified: Secondary | ICD-10-CM | POA: Diagnosis not present

## 2021-02-26 DIAGNOSIS — D509 Iron deficiency anemia, unspecified: Secondary | ICD-10-CM | POA: Diagnosis not present

## 2021-02-26 DIAGNOSIS — U071 COVID-19: Secondary | ICD-10-CM | POA: Diagnosis not present

## 2021-02-26 DIAGNOSIS — F0150 Vascular dementia without behavioral disturbance: Secondary | ICD-10-CM | POA: Diagnosis not present

## 2021-02-26 DIAGNOSIS — E785 Hyperlipidemia, unspecified: Secondary | ICD-10-CM | POA: Diagnosis not present

## 2021-02-26 DIAGNOSIS — I517 Cardiomegaly: Secondary | ICD-10-CM | POA: Diagnosis not present

## 2021-02-26 DIAGNOSIS — S72141D Displaced intertrochanteric fracture of right femur, subsequent encounter for closed fracture with routine healing: Secondary | ICD-10-CM | POA: Diagnosis not present

## 2021-02-27 DIAGNOSIS — D509 Iron deficiency anemia, unspecified: Secondary | ICD-10-CM | POA: Diagnosis not present

## 2021-02-27 DIAGNOSIS — F0150 Vascular dementia without behavioral disturbance: Secondary | ICD-10-CM | POA: Diagnosis not present

## 2021-02-27 DIAGNOSIS — U071 COVID-19: Secondary | ICD-10-CM | POA: Diagnosis not present

## 2021-02-27 DIAGNOSIS — S72141D Displaced intertrochanteric fracture of right femur, subsequent encounter for closed fracture with routine healing: Secondary | ICD-10-CM | POA: Diagnosis not present

## 2021-02-27 DIAGNOSIS — E785 Hyperlipidemia, unspecified: Secondary | ICD-10-CM | POA: Diagnosis not present

## 2021-02-27 DIAGNOSIS — I517 Cardiomegaly: Secondary | ICD-10-CM | POA: Diagnosis not present

## 2021-02-27 MED ORDER — APIXABAN 5 MG PO TABS: 5.0000 mg | ORAL_TABLET | Freq: Two times a day (BID) | ORAL | 4 refills | Status: DC

## 2021-02-28 DIAGNOSIS — D509 Iron deficiency anemia, unspecified: Secondary | ICD-10-CM | POA: Diagnosis not present

## 2021-02-28 DIAGNOSIS — E785 Hyperlipidemia, unspecified: Secondary | ICD-10-CM | POA: Diagnosis not present

## 2021-02-28 DIAGNOSIS — S72141D Displaced intertrochanteric fracture of right femur, subsequent encounter for closed fracture with routine healing: Secondary | ICD-10-CM | POA: Diagnosis not present

## 2021-02-28 DIAGNOSIS — U071 COVID-19: Secondary | ICD-10-CM | POA: Diagnosis not present

## 2021-02-28 DIAGNOSIS — F0150 Vascular dementia without behavioral disturbance: Secondary | ICD-10-CM | POA: Diagnosis not present

## 2021-02-28 DIAGNOSIS — I517 Cardiomegaly: Secondary | ICD-10-CM | POA: Diagnosis not present

## 2021-03-03 DIAGNOSIS — I77819 Aortic ectasia, unspecified site: Secondary | ICD-10-CM | POA: Diagnosis not present

## 2021-03-03 DIAGNOSIS — S72141D Displaced intertrochanteric fracture of right femur, subsequent encounter for closed fracture with routine healing: Secondary | ICD-10-CM | POA: Diagnosis not present

## 2021-03-03 DIAGNOSIS — F32A Depression, unspecified: Secondary | ICD-10-CM | POA: Diagnosis not present

## 2021-03-03 DIAGNOSIS — Z9181 History of falling: Secondary | ICD-10-CM | POA: Diagnosis not present

## 2021-03-03 DIAGNOSIS — E785 Hyperlipidemia, unspecified: Secondary | ICD-10-CM | POA: Diagnosis not present

## 2021-03-03 DIAGNOSIS — Z79899 Other long term (current) drug therapy: Secondary | ICD-10-CM | POA: Diagnosis not present

## 2021-03-03 DIAGNOSIS — Z7952 Long term (current) use of systemic steroids: Secondary | ICD-10-CM | POA: Diagnosis not present

## 2021-03-03 DIAGNOSIS — Z86718 Personal history of other venous thrombosis and embolism: Secondary | ICD-10-CM | POA: Diagnosis not present

## 2021-03-03 DIAGNOSIS — Z7901 Long term (current) use of anticoagulants: Secondary | ICD-10-CM | POA: Diagnosis not present

## 2021-03-03 DIAGNOSIS — Z8673 Personal history of transient ischemic attack (TIA), and cerebral infarction without residual deficits: Secondary | ICD-10-CM | POA: Diagnosis not present

## 2021-03-03 DIAGNOSIS — U071 COVID-19: Secondary | ICD-10-CM | POA: Diagnosis not present

## 2021-03-03 DIAGNOSIS — I517 Cardiomegaly: Secondary | ICD-10-CM | POA: Diagnosis not present

## 2021-03-03 DIAGNOSIS — D509 Iron deficiency anemia, unspecified: Secondary | ICD-10-CM | POA: Diagnosis not present

## 2021-03-03 DIAGNOSIS — F0150 Vascular dementia without behavioral disturbance: Secondary | ICD-10-CM | POA: Diagnosis not present

## 2021-03-03 DIAGNOSIS — Z85038 Personal history of other malignant neoplasm of large intestine: Secondary | ICD-10-CM | POA: Diagnosis not present

## 2021-03-03 DIAGNOSIS — H9190 Unspecified hearing loss, unspecified ear: Secondary | ICD-10-CM | POA: Diagnosis not present

## 2021-03-04 DIAGNOSIS — U071 COVID-19: Secondary | ICD-10-CM | POA: Diagnosis not present

## 2021-03-04 DIAGNOSIS — E785 Hyperlipidemia, unspecified: Secondary | ICD-10-CM | POA: Diagnosis not present

## 2021-03-04 DIAGNOSIS — S72141D Displaced intertrochanteric fracture of right femur, subsequent encounter for closed fracture with routine healing: Secondary | ICD-10-CM | POA: Diagnosis not present

## 2021-03-04 DIAGNOSIS — D509 Iron deficiency anemia, unspecified: Secondary | ICD-10-CM | POA: Diagnosis not present

## 2021-03-04 DIAGNOSIS — F0150 Vascular dementia without behavioral disturbance: Secondary | ICD-10-CM | POA: Diagnosis not present

## 2021-03-04 DIAGNOSIS — I517 Cardiomegaly: Secondary | ICD-10-CM | POA: Diagnosis not present

## 2021-03-05 DIAGNOSIS — E785 Hyperlipidemia, unspecified: Secondary | ICD-10-CM | POA: Diagnosis not present

## 2021-03-05 DIAGNOSIS — F0150 Vascular dementia without behavioral disturbance: Secondary | ICD-10-CM | POA: Diagnosis not present

## 2021-03-05 DIAGNOSIS — S72141D Displaced intertrochanteric fracture of right femur, subsequent encounter for closed fracture with routine healing: Secondary | ICD-10-CM | POA: Diagnosis not present

## 2021-03-05 DIAGNOSIS — I517 Cardiomegaly: Secondary | ICD-10-CM | POA: Diagnosis not present

## 2021-03-05 DIAGNOSIS — U071 COVID-19: Secondary | ICD-10-CM | POA: Diagnosis not present

## 2021-03-05 DIAGNOSIS — D509 Iron deficiency anemia, unspecified: Secondary | ICD-10-CM | POA: Diagnosis not present

## 2021-03-06 DIAGNOSIS — S72141D Displaced intertrochanteric fracture of right femur, subsequent encounter for closed fracture with routine healing: Secondary | ICD-10-CM | POA: Diagnosis not present

## 2021-03-06 DIAGNOSIS — D509 Iron deficiency anemia, unspecified: Secondary | ICD-10-CM | POA: Diagnosis not present

## 2021-03-06 DIAGNOSIS — U071 COVID-19: Secondary | ICD-10-CM | POA: Diagnosis not present

## 2021-03-06 DIAGNOSIS — F0150 Vascular dementia without behavioral disturbance: Secondary | ICD-10-CM | POA: Diagnosis not present

## 2021-03-06 DIAGNOSIS — E785 Hyperlipidemia, unspecified: Secondary | ICD-10-CM | POA: Diagnosis not present

## 2021-03-06 DIAGNOSIS — I517 Cardiomegaly: Secondary | ICD-10-CM | POA: Diagnosis not present

## 2021-03-07 DIAGNOSIS — S72141D Displaced intertrochanteric fracture of right femur, subsequent encounter for closed fracture with routine healing: Secondary | ICD-10-CM | POA: Diagnosis not present

## 2021-03-07 DIAGNOSIS — Z4789 Encounter for other orthopedic aftercare: Secondary | ICD-10-CM | POA: Diagnosis not present

## 2021-03-12 DIAGNOSIS — S72141D Displaced intertrochanteric fracture of right femur, subsequent encounter for closed fracture with routine healing: Secondary | ICD-10-CM | POA: Diagnosis not present

## 2021-03-12 DIAGNOSIS — E785 Hyperlipidemia, unspecified: Secondary | ICD-10-CM | POA: Diagnosis not present

## 2021-03-12 DIAGNOSIS — I517 Cardiomegaly: Secondary | ICD-10-CM | POA: Diagnosis not present

## 2021-03-12 DIAGNOSIS — D509 Iron deficiency anemia, unspecified: Secondary | ICD-10-CM | POA: Diagnosis not present

## 2021-03-12 DIAGNOSIS — U071 COVID-19: Secondary | ICD-10-CM | POA: Diagnosis not present

## 2021-03-12 DIAGNOSIS — F0150 Vascular dementia without behavioral disturbance: Secondary | ICD-10-CM | POA: Diagnosis not present

## 2021-03-13 DIAGNOSIS — S72141D Displaced intertrochanteric fracture of right femur, subsequent encounter for closed fracture with routine healing: Secondary | ICD-10-CM | POA: Diagnosis not present

## 2021-03-13 DIAGNOSIS — E785 Hyperlipidemia, unspecified: Secondary | ICD-10-CM | POA: Diagnosis not present

## 2021-03-13 DIAGNOSIS — F0150 Vascular dementia without behavioral disturbance: Secondary | ICD-10-CM | POA: Diagnosis not present

## 2021-03-13 DIAGNOSIS — D509 Iron deficiency anemia, unspecified: Secondary | ICD-10-CM | POA: Diagnosis not present

## 2021-03-13 DIAGNOSIS — I517 Cardiomegaly: Secondary | ICD-10-CM | POA: Diagnosis not present

## 2021-03-13 DIAGNOSIS — U071 COVID-19: Secondary | ICD-10-CM | POA: Diagnosis not present

## 2021-03-14 DIAGNOSIS — S72141A Displaced intertrochanteric fracture of right femur, initial encounter for closed fracture: Secondary | ICD-10-CM | POA: Insufficient documentation

## 2021-03-15 ENCOUNTER — Telehealth (INDEPENDENT_AMBULATORY_CARE_PROVIDER_SITE_OTHER): Payer: Medicare Other | Admitting: Internal Medicine

## 2021-03-15 ENCOUNTER — Encounter: Payer: Self-pay | Admitting: Internal Medicine

## 2021-03-15 DIAGNOSIS — F03A0 Unspecified dementia, mild, without behavioral disturbance, psychotic disturbance, mood disturbance, and anxiety: Secondary | ICD-10-CM

## 2021-03-15 DIAGNOSIS — I517 Cardiomegaly: Secondary | ICD-10-CM | POA: Diagnosis not present

## 2021-03-15 DIAGNOSIS — F0150 Vascular dementia without behavioral disturbance: Secondary | ICD-10-CM | POA: Diagnosis not present

## 2021-03-15 DIAGNOSIS — E785 Hyperlipidemia, unspecified: Secondary | ICD-10-CM | POA: Diagnosis not present

## 2021-03-15 DIAGNOSIS — S72001D Fracture of unspecified part of neck of right femur, subsequent encounter for closed fracture with routine healing: Secondary | ICD-10-CM

## 2021-03-15 DIAGNOSIS — S72141D Displaced intertrochanteric fracture of right femur, subsequent encounter for closed fracture with routine healing: Secondary | ICD-10-CM | POA: Diagnosis not present

## 2021-03-15 DIAGNOSIS — F0390 Unspecified dementia without behavioral disturbance: Secondary | ICD-10-CM

## 2021-03-15 DIAGNOSIS — D509 Iron deficiency anemia, unspecified: Secondary | ICD-10-CM | POA: Diagnosis not present

## 2021-03-15 DIAGNOSIS — I499 Cardiac arrhythmia, unspecified: Secondary | ICD-10-CM

## 2021-03-15 DIAGNOSIS — U071 COVID-19: Secondary | ICD-10-CM | POA: Diagnosis not present

## 2021-03-15 NOTE — Assessment & Plan Note (Signed)
Will check on eliquis patient assistance. Checking CBC and BMP.

## 2021-03-15 NOTE — Progress Notes (Signed)
Virtual Visit via Video Note  I connected with [name] Barker on 03/15/21 at  2:20 PM EDT by a video enabled telemedicine application and verified that I am speaking with the correct person using two identifiers.  The patient and the provider were at separate locations throughout the entire encounter. Patient location: home, Provider location: work   I discussed the limitations of evaluation and management by telemedicine and the availability of in person appointments. The patient expressed understanding and agreed to proceed. The patient and the provider were the only parties present for the visit unless noted in HPI below.  History of Present Illness: The patient is a 87 y.o. female with visit for follow up dementia (overall stable, good energy, often cannot complete thoughts, no behavioral issues, sleeping okay) and hip fracture (follow up recently with orthopedic and healing okay, she is walking with walker, no recent falls, no pain, overall doing well) and irregular heartbeat (doing well on eliquis, would like to continue but has not heard back about patient assistance, denies bruising or bleeding).Linus Mako walker with minimal assist and can stand on her own as long as chair is not too low. Daughter present on call to help with history due to dementia.  Observations/Objective: Appearance: normal, breathing appears normal, casual grooming, abdomen does not appear distended, memory impaired, mental status is awake and oriented to self and place  Assessment and Plan: See problem oriented charting  Follow Up Instructions: checking labs  I discussed the assessment and treatment plan with the patient. The patient was provided an opportunity to ask questions and all were answered. The patient agreed with the plan and demonstrated an understanding of the instructions.   The patient was advised to call back or seek an in-person evaluation if the symptoms worsen or if the condition fails to improve  as anticipated.  Hoyt Koch, MD

## 2021-03-15 NOTE — Assessment & Plan Note (Signed)
Doing well overall, checking CBC to ensure blood counts are improving from hospital levels.

## 2021-03-15 NOTE — Assessment & Plan Note (Signed)
Overall with minimal progression. She is doing well. Celexa helps with mood.

## 2021-03-19 DIAGNOSIS — E785 Hyperlipidemia, unspecified: Secondary | ICD-10-CM | POA: Diagnosis not present

## 2021-03-19 DIAGNOSIS — S72141D Displaced intertrochanteric fracture of right femur, subsequent encounter for closed fracture with routine healing: Secondary | ICD-10-CM | POA: Diagnosis not present

## 2021-03-19 DIAGNOSIS — F0150 Vascular dementia without behavioral disturbance: Secondary | ICD-10-CM | POA: Diagnosis not present

## 2021-03-19 DIAGNOSIS — D509 Iron deficiency anemia, unspecified: Secondary | ICD-10-CM | POA: Diagnosis not present

## 2021-03-19 DIAGNOSIS — U071 COVID-19: Secondary | ICD-10-CM | POA: Diagnosis not present

## 2021-03-19 DIAGNOSIS — I517 Cardiomegaly: Secondary | ICD-10-CM | POA: Diagnosis not present

## 2021-03-20 DIAGNOSIS — D509 Iron deficiency anemia, unspecified: Secondary | ICD-10-CM | POA: Diagnosis not present

## 2021-03-20 DIAGNOSIS — I517 Cardiomegaly: Secondary | ICD-10-CM | POA: Diagnosis not present

## 2021-03-20 DIAGNOSIS — U071 COVID-19: Secondary | ICD-10-CM | POA: Diagnosis not present

## 2021-03-20 DIAGNOSIS — F0150 Vascular dementia without behavioral disturbance: Secondary | ICD-10-CM | POA: Diagnosis not present

## 2021-03-20 DIAGNOSIS — E785 Hyperlipidemia, unspecified: Secondary | ICD-10-CM | POA: Diagnosis not present

## 2021-03-20 DIAGNOSIS — S72141D Displaced intertrochanteric fracture of right femur, subsequent encounter for closed fracture with routine healing: Secondary | ICD-10-CM | POA: Diagnosis not present

## 2021-03-21 ENCOUNTER — Telehealth: Payer: Self-pay | Admitting: Pharmacist

## 2021-03-21 DIAGNOSIS — U071 COVID-19: Secondary | ICD-10-CM | POA: Diagnosis not present

## 2021-03-21 DIAGNOSIS — E785 Hyperlipidemia, unspecified: Secondary | ICD-10-CM | POA: Diagnosis not present

## 2021-03-21 DIAGNOSIS — D509 Iron deficiency anemia, unspecified: Secondary | ICD-10-CM | POA: Diagnosis not present

## 2021-03-21 DIAGNOSIS — I517 Cardiomegaly: Secondary | ICD-10-CM | POA: Diagnosis not present

## 2021-03-21 DIAGNOSIS — F0150 Vascular dementia without behavioral disturbance: Secondary | ICD-10-CM | POA: Diagnosis not present

## 2021-03-21 DIAGNOSIS — S72141D Displaced intertrochanteric fracture of right femur, subsequent encounter for closed fracture with routine healing: Secondary | ICD-10-CM | POA: Diagnosis not present

## 2021-03-21 NOTE — Telephone Encounter (Signed)
Re-faxed Eliquis application to [employer] at [phone].  Of note the first fax did contain pages 3 and 4, it was a 9-page document. Unclear why these pages did not go through the first time.

## 2021-03-21 NOTE — Progress Notes (Addendum)
A call was made to [place] to check on the status of patient's patient assistance application for Eliquis. Spoke with the representative [employer]) who stated that part of the application was missing. She stated that page 3 which is the patient consent page needs to be signed and dated. Also page 4 which is where the provider needs to fill in the prescription for the eliquis, sign and date. Fax back to [employer] so that it can be processed.    Wendy Poet, [place] [phone]

## 2021-03-25 NOTE — Progress Notes (Signed)
A call was made to [employer] to check on the status of the patient's patient assistant form for Eliquis. Spoke with the representative Laural Roes) who stated that they received a cover sheet and page 2 of 2. The application has to be re-faxed   Wendy Poet, [place] [phone]

## 2021-03-26 NOTE — Telephone Encounter (Signed)
Re-faxed Eliquis application using a different fax machine. 9 pages included in total.

## 2021-03-29 DIAGNOSIS — E785 Hyperlipidemia, unspecified: Secondary | ICD-10-CM | POA: Diagnosis not present

## 2021-03-29 DIAGNOSIS — U071 COVID-19: Secondary | ICD-10-CM | POA: Diagnosis not present

## 2021-03-29 DIAGNOSIS — I517 Cardiomegaly: Secondary | ICD-10-CM | POA: Diagnosis not present

## 2021-03-29 DIAGNOSIS — D509 Iron deficiency anemia, unspecified: Secondary | ICD-10-CM | POA: Diagnosis not present

## 2021-03-29 DIAGNOSIS — S72141D Displaced intertrochanteric fracture of right femur, subsequent encounter for closed fracture with routine healing: Secondary | ICD-10-CM | POA: Diagnosis not present

## 2021-03-29 DIAGNOSIS — F0150 Vascular dementia without behavioral disturbance: Secondary | ICD-10-CM | POA: Diagnosis not present

## 2021-04-02 DIAGNOSIS — Z8673 Personal history of transient ischemic attack (TIA), and cerebral infarction without residual deficits: Secondary | ICD-10-CM | POA: Diagnosis not present

## 2021-04-02 DIAGNOSIS — Z79899 Other long term (current) drug therapy: Secondary | ICD-10-CM | POA: Diagnosis not present

## 2021-04-02 DIAGNOSIS — E785 Hyperlipidemia, unspecified: Secondary | ICD-10-CM | POA: Diagnosis not present

## 2021-04-02 DIAGNOSIS — I77819 Aortic ectasia, unspecified site: Secondary | ICD-10-CM | POA: Diagnosis not present

## 2021-04-02 DIAGNOSIS — D509 Iron deficiency anemia, unspecified: Secondary | ICD-10-CM | POA: Diagnosis not present

## 2021-04-02 DIAGNOSIS — F0150 Vascular dementia without behavioral disturbance: Secondary | ICD-10-CM | POA: Diagnosis not present

## 2021-04-02 DIAGNOSIS — Z7901 Long term (current) use of anticoagulants: Secondary | ICD-10-CM | POA: Diagnosis not present

## 2021-04-02 DIAGNOSIS — Z9181 History of falling: Secondary | ICD-10-CM | POA: Diagnosis not present

## 2021-04-02 DIAGNOSIS — I517 Cardiomegaly: Secondary | ICD-10-CM | POA: Diagnosis not present

## 2021-04-02 DIAGNOSIS — Z8616 Personal history of COVID-19: Secondary | ICD-10-CM | POA: Diagnosis not present

## 2021-04-02 DIAGNOSIS — Z85038 Personal history of other malignant neoplasm of large intestine: Secondary | ICD-10-CM | POA: Diagnosis not present

## 2021-04-02 DIAGNOSIS — S72141D Displaced intertrochanteric fracture of right femur, subsequent encounter for closed fracture with routine healing: Secondary | ICD-10-CM | POA: Diagnosis not present

## 2021-04-02 DIAGNOSIS — Z86718 Personal history of other venous thrombosis and embolism: Secondary | ICD-10-CM | POA: Diagnosis not present

## 2021-04-03 NOTE — Progress Notes (Signed)
A call was made to [place] to check the status of the patient's patient assistance applications for Eliquis. Spoke with representative Bethena Roys) who said that a fax was received on 04/01/21 but only the cover page came through. She suggested to use the patient portal at [email or web address] and follow directions, this may be a better option.   [place]

## 2021-04-03 NOTE — Telephone Encounter (Signed)
Uploaded Eliquis application to online portal.

## 2021-04-12 DIAGNOSIS — I517 Cardiomegaly: Secondary | ICD-10-CM | POA: Diagnosis not present

## 2021-04-12 DIAGNOSIS — I77819 Aortic ectasia, unspecified site: Secondary | ICD-10-CM | POA: Diagnosis not present

## 2021-04-12 DIAGNOSIS — E785 Hyperlipidemia, unspecified: Secondary | ICD-10-CM | POA: Diagnosis not present

## 2021-04-12 DIAGNOSIS — F0150 Vascular dementia without behavioral disturbance: Secondary | ICD-10-CM | POA: Diagnosis not present

## 2021-04-12 DIAGNOSIS — D509 Iron deficiency anemia, unspecified: Secondary | ICD-10-CM | POA: Diagnosis not present

## 2021-04-12 DIAGNOSIS — S72141D Displaced intertrochanteric fracture of right femur, subsequent encounter for closed fracture with routine healing: Secondary | ICD-10-CM | POA: Diagnosis not present

## 2021-04-15 DIAGNOSIS — S72141D Displaced intertrochanteric fracture of right femur, subsequent encounter for closed fracture with routine healing: Secondary | ICD-10-CM | POA: Diagnosis not present

## 2021-04-15 DIAGNOSIS — E785 Hyperlipidemia, unspecified: Secondary | ICD-10-CM | POA: Diagnosis not present

## 2021-04-15 DIAGNOSIS — F0150 Vascular dementia without behavioral disturbance: Secondary | ICD-10-CM | POA: Diagnosis not present

## 2021-04-15 DIAGNOSIS — I517 Cardiomegaly: Secondary | ICD-10-CM | POA: Diagnosis not present

## 2021-04-15 DIAGNOSIS — I77819 Aortic ectasia, unspecified site: Secondary | ICD-10-CM | POA: Diagnosis not present

## 2021-04-15 DIAGNOSIS — D509 Iron deficiency anemia, unspecified: Secondary | ICD-10-CM | POA: Diagnosis not present

## 2021-04-19 DIAGNOSIS — I77819 Aortic ectasia, unspecified site: Secondary | ICD-10-CM | POA: Diagnosis not present

## 2021-04-19 DIAGNOSIS — S72141D Displaced intertrochanteric fracture of right femur, subsequent encounter for closed fracture with routine healing: Secondary | ICD-10-CM | POA: Diagnosis not present

## 2021-04-19 DIAGNOSIS — D509 Iron deficiency anemia, unspecified: Secondary | ICD-10-CM | POA: Diagnosis not present

## 2021-04-19 DIAGNOSIS — E785 Hyperlipidemia, unspecified: Secondary | ICD-10-CM | POA: Diagnosis not present

## 2021-04-19 DIAGNOSIS — F0150 Vascular dementia without behavioral disturbance: Secondary | ICD-10-CM | POA: Diagnosis not present

## 2021-04-19 DIAGNOSIS — I517 Cardiomegaly: Secondary | ICD-10-CM | POA: Diagnosis not present

## 2021-04-22 DIAGNOSIS — I517 Cardiomegaly: Secondary | ICD-10-CM | POA: Diagnosis not present

## 2021-04-22 DIAGNOSIS — I77819 Aortic ectasia, unspecified site: Secondary | ICD-10-CM | POA: Diagnosis not present

## 2021-04-22 DIAGNOSIS — S72141D Displaced intertrochanteric fracture of right femur, subsequent encounter for closed fracture with routine healing: Secondary | ICD-10-CM | POA: Diagnosis not present

## 2021-04-22 DIAGNOSIS — E785 Hyperlipidemia, unspecified: Secondary | ICD-10-CM | POA: Diagnosis not present

## 2021-04-22 DIAGNOSIS — F0150 Vascular dementia without behavioral disturbance: Secondary | ICD-10-CM | POA: Diagnosis not present

## 2021-04-22 DIAGNOSIS — D509 Iron deficiency anemia, unspecified: Secondary | ICD-10-CM | POA: Diagnosis not present

## 2021-04-26 ENCOUNTER — Telehealth: Payer: Self-pay | Admitting: Pharmacist

## 2021-04-26 DIAGNOSIS — S72141D Displaced intertrochanteric fracture of right femur, subsequent encounter for closed fracture with routine healing: Secondary | ICD-10-CM | POA: Diagnosis not present

## 2021-04-26 DIAGNOSIS — D509 Iron deficiency anemia, unspecified: Secondary | ICD-10-CM | POA: Diagnosis not present

## 2021-04-26 DIAGNOSIS — E785 Hyperlipidemia, unspecified: Secondary | ICD-10-CM | POA: Diagnosis not present

## 2021-04-26 DIAGNOSIS — I77819 Aortic ectasia, unspecified site: Secondary | ICD-10-CM | POA: Diagnosis not present

## 2021-04-26 DIAGNOSIS — F0150 Vascular dementia without behavioral disturbance: Secondary | ICD-10-CM | POA: Diagnosis not present

## 2021-04-26 DIAGNOSIS — I517 Cardiomegaly: Secondary | ICD-10-CM | POA: Diagnosis not present

## 2021-04-26 NOTE — Telephone Encounter (Signed)
Eliquis application through [employer] has been approved through 12/28/21. Patient will receive first delivery within 10-14 business days.

## 2021-04-26 NOTE — Progress Notes (Signed)
    Chronic Care Management Pharmacy Assistant   Name: [name] Barker  MRN: [id number] DOB: 05/25/1921   Reason for Encounter: Chart Review    Medications: Outpatient Encounter Medications as of 04/26/2021  Medication Sig  . acetaminophen (TYLENOL) 500 MG tablet Take 500 mg by mouth 2 (two) times a day. FOR BACK PAIN  . albuterol (VENTOLIN HFA) 108 (90 Base) MCG/ACT inhaler INHALE 2 PUFFS INTO THE LUNGS EVERY SIX HOURS AS NEEDED FOR WHEEZING OR SHORTNESS OF BREATH.  Marland Kitchen apixaban (ELIQUIS) 5 MG TABS tablet Take 1 tablet (5 mg total) by mouth 2 (two) times daily.  . Cholecalciferol (VITAMIN D-3) 25 MCG (1000 UT) CAPS Take 1,000 Units by mouth daily.  . citalopram (CELEXA) 10 MG tablet Take 1 tablet (10 mg total) by mouth daily.  . Cranberry 500 MG TABS Take 500 mg by mouth daily.  . famotidine (PEPCID) 20 MG tablet Take 1 tablet (20 mg total) by mouth at bedtime.  . fexofenadine (ALLEGRA) 180 MG tablet Take 180 mg by mouth in the morning.  . fluticasone (FLONASE) 50 MCG/ACT nasal spray Place 1 spray into both nostrils daily.  . Multiple Vitamin (MULTIVITAMIN) tablet Take 1 tablet by mouth daily.  . Multiple Vitamins-Minerals (PRESERVISION AREDS 2 PO) Take 1 tablet by mouth in the morning and at bedtime.  . Vitamin D, Cholecalciferol, 25 MCG (1000 UT) TABS Take 1,000 mg by mouth daily.  . [DISCONTINUED] pantoprazole (PROTONIX) 40 MG tablet Take 1 tablet (40 mg total) by mouth daily. (Patient not taking: Reported on 02/05/2021)   No facility-administered encounter medications on file as of 04/26/2021.    Reviewed chart for medication changes and adherence.   No gaps in adherence identified. Patient has follow up scheduled with pharmacy team. No further action required.  [place] Pharmacist Assistant [phone]  Time spent:9

## 2021-04-29 DIAGNOSIS — I77819 Aortic ectasia, unspecified site: Secondary | ICD-10-CM | POA: Diagnosis not present

## 2021-04-29 DIAGNOSIS — E785 Hyperlipidemia, unspecified: Secondary | ICD-10-CM | POA: Diagnosis not present

## 2021-04-29 DIAGNOSIS — D509 Iron deficiency anemia, unspecified: Secondary | ICD-10-CM | POA: Diagnosis not present

## 2021-04-29 DIAGNOSIS — S72141D Displaced intertrochanteric fracture of right femur, subsequent encounter for closed fracture with routine healing: Secondary | ICD-10-CM | POA: Diagnosis not present

## 2021-04-29 DIAGNOSIS — F0150 Vascular dementia without behavioral disturbance: Secondary | ICD-10-CM | POA: Diagnosis not present

## 2021-04-29 DIAGNOSIS — I517 Cardiomegaly: Secondary | ICD-10-CM | POA: Diagnosis not present

## 2021-05-02 DIAGNOSIS — Z79899 Other long term (current) drug therapy: Secondary | ICD-10-CM | POA: Diagnosis not present

## 2021-05-02 DIAGNOSIS — E785 Hyperlipidemia, unspecified: Secondary | ICD-10-CM | POA: Diagnosis not present

## 2021-05-02 DIAGNOSIS — Z8616 Personal history of COVID-19: Secondary | ICD-10-CM | POA: Diagnosis not present

## 2021-05-02 DIAGNOSIS — Z9181 History of falling: Secondary | ICD-10-CM | POA: Diagnosis not present

## 2021-05-02 DIAGNOSIS — Z8673 Personal history of transient ischemic attack (TIA), and cerebral infarction without residual deficits: Secondary | ICD-10-CM | POA: Diagnosis not present

## 2021-05-02 DIAGNOSIS — Z7901 Long term (current) use of anticoagulants: Secondary | ICD-10-CM | POA: Diagnosis not present

## 2021-05-02 DIAGNOSIS — Z85038 Personal history of other malignant neoplasm of large intestine: Secondary | ICD-10-CM | POA: Diagnosis not present

## 2021-05-02 DIAGNOSIS — Z86718 Personal history of other venous thrombosis and embolism: Secondary | ICD-10-CM | POA: Diagnosis not present

## 2021-05-02 DIAGNOSIS — S72141D Displaced intertrochanteric fracture of right femur, subsequent encounter for closed fracture with routine healing: Secondary | ICD-10-CM | POA: Diagnosis not present

## 2021-05-02 DIAGNOSIS — I517 Cardiomegaly: Secondary | ICD-10-CM | POA: Diagnosis not present

## 2021-05-02 DIAGNOSIS — I77819 Aortic ectasia, unspecified site: Secondary | ICD-10-CM | POA: Diagnosis not present

## 2021-05-02 DIAGNOSIS — F0150 Vascular dementia without behavioral disturbance: Secondary | ICD-10-CM | POA: Diagnosis not present

## 2021-05-02 DIAGNOSIS — D509 Iron deficiency anemia, unspecified: Secondary | ICD-10-CM | POA: Diagnosis not present

## 2021-05-07 DIAGNOSIS — F0150 Vascular dementia without behavioral disturbance: Secondary | ICD-10-CM | POA: Diagnosis not present

## 2021-05-07 DIAGNOSIS — I77819 Aortic ectasia, unspecified site: Secondary | ICD-10-CM | POA: Diagnosis not present

## 2021-05-07 DIAGNOSIS — D509 Iron deficiency anemia, unspecified: Secondary | ICD-10-CM | POA: Diagnosis not present

## 2021-05-07 DIAGNOSIS — I517 Cardiomegaly: Secondary | ICD-10-CM | POA: Diagnosis not present

## 2021-05-07 DIAGNOSIS — E785 Hyperlipidemia, unspecified: Secondary | ICD-10-CM | POA: Diagnosis not present

## 2021-05-07 DIAGNOSIS — S72141D Displaced intertrochanteric fracture of right femur, subsequent encounter for closed fracture with routine healing: Secondary | ICD-10-CM | POA: Diagnosis not present

## 2021-05-11 ENCOUNTER — Emergency Department (HOSPITAL_COMMUNITY): Payer: Medicare Other

## 2021-05-11 ENCOUNTER — Emergency Department (HOSPITAL_COMMUNITY)
Admission: EM | Admit: 2021-05-11 | Discharge: 2021-05-11 | Disposition: A | Discharge status: Home / Self Care | Payer: Medicare Other | Attending: Emergency Medicine | Admitting: Emergency Medicine

## 2021-05-11 ENCOUNTER — Other Ambulatory Visit: Payer: Self-pay

## 2021-05-11 DIAGNOSIS — Z20822 Contact with and (suspected) exposure to covid-19: Secondary | ICD-10-CM | POA: Diagnosis not present

## 2021-05-11 DIAGNOSIS — R262 Difficulty in walking, not elsewhere classified: Secondary | ICD-10-CM | POA: Diagnosis not present

## 2021-05-11 DIAGNOSIS — I2510 Atherosclerotic heart disease of native coronary artery without angina pectoris: Secondary | ICD-10-CM | POA: Insufficient documentation

## 2021-05-11 DIAGNOSIS — M79662 Pain in left lower leg: Secondary | ICD-10-CM | POA: Diagnosis not present

## 2021-05-11 DIAGNOSIS — R609 Edema, unspecified: Secondary | ICD-10-CM | POA: Diagnosis not present

## 2021-05-11 DIAGNOSIS — M79604 Pain in right leg: Secondary | ICD-10-CM | POA: Insufficient documentation

## 2021-05-11 DIAGNOSIS — M1612 Unilateral primary osteoarthritis, left hip: Secondary | ICD-10-CM | POA: Diagnosis not present

## 2021-05-11 DIAGNOSIS — M79605 Pain in left leg: Secondary | ICD-10-CM

## 2021-05-11 DIAGNOSIS — I10 Essential (primary) hypertension: Secondary | ICD-10-CM | POA: Diagnosis not present

## 2021-05-11 DIAGNOSIS — Z7901 Long term (current) use of anticoagulants: Secondary | ICD-10-CM | POA: Diagnosis not present

## 2021-05-11 DIAGNOSIS — S8990XA Unspecified injury of unspecified lower leg, initial encounter: Secondary | ICD-10-CM

## 2021-05-11 DIAGNOSIS — Z981 Arthrodesis status: Secondary | ICD-10-CM | POA: Diagnosis not present

## 2021-05-11 DIAGNOSIS — Z96642 Presence of left artificial hip joint: Secondary | ICD-10-CM | POA: Diagnosis not present

## 2021-05-11 DIAGNOSIS — M25462 Effusion, left knee: Secondary | ICD-10-CM | POA: Diagnosis not present

## 2021-05-11 DIAGNOSIS — Z8616 Personal history of COVID-19: Secondary | ICD-10-CM | POA: Diagnosis not present

## 2021-05-11 DIAGNOSIS — Z9660 Presence of unspecified orthopedic joint implant: Secondary | ICD-10-CM | POA: Diagnosis not present

## 2021-05-11 DIAGNOSIS — M25562 Pain in left knee: Secondary | ICD-10-CM | POA: Diagnosis not present

## 2021-05-11 DIAGNOSIS — M1611 Unilateral primary osteoarthritis, right hip: Secondary | ICD-10-CM | POA: Diagnosis not present

## 2021-05-11 DIAGNOSIS — M1711 Unilateral primary osteoarthritis, right knee: Secondary | ICD-10-CM | POA: Diagnosis not present

## 2021-05-11 DIAGNOSIS — F0390 Unspecified dementia without behavioral disturbance: Secondary | ICD-10-CM | POA: Diagnosis not present

## 2021-05-11 DIAGNOSIS — S7292XA Unspecified fracture of left femur, initial encounter for closed fracture: Secondary | ICD-10-CM | POA: Diagnosis not present

## 2021-05-11 DIAGNOSIS — R0902 Hypoxemia: Secondary | ICD-10-CM | POA: Diagnosis not present

## 2021-05-11 DIAGNOSIS — Z85038 Personal history of other malignant neoplasm of large intestine: Secondary | ICD-10-CM | POA: Diagnosis not present

## 2021-05-11 LAB — BASIC METABOLIC PANEL
Anion gap: 4 — ABNORMAL LOW (ref 5–15)
BUN: 9 mg/dL (ref 8–23)
CO2: 30 mmol/L (ref 22–32)
Calcium: 8.4 mg/dL — ABNORMAL LOW (ref 8.9–10.3)
Chloride: 102 mmol/L (ref 98–111)
Creatinine, Ser: 0.51 mg/dL (ref 0.44–1.00)
GFR, Estimated: >60 mL/min (ref >60)
Glucose, Bld: 101 mg/dL — ABNORMAL HIGH (ref 70–99)
Potassium: 3.7 mmol/L (ref 3.5–5.1)
Sodium: 136 mmol/L (ref 135–145)

## 2021-05-11 LAB — CBC WITH DIFFERENTIAL/PLATELET
Abs Immature Granulocytes: 0.02 10*3/uL (ref 0.00–0.07)
Basophils Absolute: 0 10*3/uL (ref 0.0–0.1)
Basophils Relative: 1 %
Eosinophils Absolute: 0.1 10*3/uL (ref 0.0–0.5)
Eosinophils Relative: 2 %
HCT: 34.7 % — ABNORMAL LOW (ref 36.0–46.0)
Hemoglobin: 10.9 g/dL — ABNORMAL LOW (ref 12.0–15.0)
Immature Granulocytes: 0 %
Lymphocytes Relative: 32 %
Lymphs Abs: 2 10*3/uL (ref 0.7–4.0)
MCH: 29.5 pg (ref 26.0–34.0)
MCHC: 31.4 g/dL (ref 30.0–36.0)
MCV: 93.8 fL (ref 80.0–100.0)
Monocytes Absolute: 0.6 10*3/uL (ref 0.1–1.0)
Monocytes Relative: 10 %
Neutro Abs: 3.3 10*3/uL (ref 1.7–7.7)
Neutrophils Relative %: 55 %
Platelets: 241 10*3/uL (ref 150–400)
RBC: 3.7 MIL/uL — ABNORMAL LOW (ref 3.87–5.11)
RDW: 13.2 % (ref 11.5–15.5)
WBC: 6.1 10*3/uL (ref 4.0–10.5)
nRBC: 0 % (ref 0.0–0.2)

## 2021-05-11 LAB — RESP PANEL BY RT-PCR (FLU A&B, COVID) ARPGX2
Influenza A by PCR: NEGATIVE
Influenza B by PCR: NEGATIVE
SARS Coronavirus 2 by RT PCR: NEGATIVE

## 2021-05-11 MED ORDER — DICLOFENAC SODIUM 1 % EX GEL: 4.0000 g | Freq: Four times a day (QID) | CUTANEOUS | 0 refills | Status: DC

## 2021-05-11 NOTE — ED Triage Notes (Signed)
Pt arrived via [facility]. EMS reports pt was picked up from home with daughter on scene. Pt's daughter reported to them that she assisted pt up out of bed this morning and pt pivoted out of bed with no fall and returned to bed approx 4 hours later pt began to c/o severe pain in left upper leg. EMS reports obvious distress in pain on their arrival. 50 mcg fentanyl administered by EMS. Pt placed on O2 only d/t SpO2 decreasing to the low 90's after fentanyl admin. Pt alert, only c/o aching pain in left leg.

## 2021-05-11 NOTE — ED Notes (Signed)
Pt transported to xray 

## 2021-05-11 NOTE — ED Notes (Signed)
Patient transported to X-ray 

## 2021-05-11 NOTE — ED Provider Notes (Signed)
I personally evaluated the patient during the encounter and completed a history, physical, procedures, medical decision making to contribute to the overall care of the patient and decision making for the patient briefly, the patient is a 87 y.o. female here with leg pain.  History of dementia.  Patient on blood thinner.  Did not have a specific fall.  Patient lives at home with daughter.  She states that around 6 AM she helped her get up out of bed to transfer to bedside commode and then put her back in bed.  When she went to go get her later the morning she was having a hard time putting any weight on her left leg.  She noticed a deformity to the left thigh area possibly.  Has a history of knee replacement in the past.  Has a history of femur fracture on the right in the past.  Patient has bed alarm and there is no concern for another fall.  She has no signs of head trauma.  Patient appears to be at her baseline.  Family is adamant that she did not have another fall and overall we will hold off on head CT.  We will get x-rays of her lower extremity.  She appears neurovascular intact otherwise.  If there is not a fracture suspect that this is likely some arthritic knee process as well.  Please see PA note for further results, evaluation, disposition of the patient.  I will continue to help with medical decision making.  This chart was dictated using voice recognition software.  Despite best efforts to proofread,  errors can occur which can change the documentation meaning.     EKG Interpretation None           Lennice Sites, DO 05/11/21 1326

## 2021-05-11 NOTE — ED Provider Notes (Addendum)
[facility] EMERGENCY DEPARTMENT Provider Note   CSN: [id number] Arrival date & time: 05/11/21  1203     History Chief Complaint  Patient presents with  . Leg Injury    [name] Barker is a 87 y.o. female.  HPI Patient is a 87 year old female with a past medical history significant for left femur fracture, SBO, chronic low back pain, stroke, HTN, HLD, she is a DNR  Discussed case with patient's caregiver as well as daughter.  Notably patient is on blood thinner and has a history of dementia.  Level 5 caveat due to dementia.  Daughter states is exam she attempted to BED TO TRANSFER TO BEDSIDE COMMODE AND THEN PUT HER BACK INTO BED.  SHE SEEMED TO BE HAVING DIFFICULTY PUTTING WEIGHT ON HER LEFT LEG LATER IN THE MORNING. Daughter states that when she attempted to assist patient to stand later in the day she was having a hard time putting weight on her left leg which caused pain and discomfort.  At this time her daughter pushed on her left leg just below above her left knee and felt that there is a lump here that had not been there in the past.   Daughter states that there is no possibility that the patient has fallen and she has a bed alarm and daughter was home the entirety of the time between when she ambulated without difficulty when she had pain.       Past Medical History:  Diagnosis Date  . Allergic rhinitis   . ANEMIA-NOS 11/17/2006  . Colon cancer ([place]) 08/19/2007   1994 T2, 2008 T3, N1  . Colon cancer ([place])   . COVID-19   . DIVERTICULOSIS-COLON 05/30/2010  . DNR (do not resuscitate)   . Dyslipidemia   . Femur fracture, left ([place]) 09/13/2018   CLOSED  . GERD 11/17/2006  . Hemorrhoids   . History of CVA (cerebrovascular accident)   . HYPERLIPIDEMIA 11/17/2006  . LOW BACK PAIN, CHRONIC 02/04/2010  . Osteopenia 11/17/2006  . Small bowel obstruction ([place])   . TIA (transient ischemic attack)     Patient Active Problem List   Diagnosis Date Noted   . Closed right hip fracture, with routine healing, subsequent encounter 01/26/2021  . History of CVA (cerebrovascular accident)   . COVID-19   . DNR (do not resuscitate)   . Hx of completed stroke 03/31/2019  . Vasomotor rhinitis 11/17/2018  . Irregular heartbeat 09/21/2017  . Mild dementia ([place]) 08/10/2017  . Bilateral hearing loss 08/07/2016  . Depression 08/07/2016  . Vitamin D deficiency 11/04/2015  . Routine general medical examination at a health care facility 06/08/2015  . CAD 06/21/2010  . LOW BACK PAIN, CHRONIC 02/04/2010  . ADENOCARCINOMA, COLON 08/19/2007  . Hyperlipidemia 11/17/2006  . ANEMIA-NOS 11/17/2006  . GERD 11/17/2006  . OSTEOPENIA 11/17/2006    Past Surgical History:  Procedure Laterality Date  . ABDOMINAL HYSTERECTOMY    . APPENDECTOMY    . BACK SURGERY  2007  . BELPHAROPTOSIS REPAIR    . CATARACT EXTRACTION    . HEMICOLECTOMY  1994   Right  . INTRAMEDULLARY (IM) NAIL INTERTROCHANTERIC Right 01/27/2021   Procedure: INTRAMEDULLARY (IM) NAIL INTERTROCHANTRIC;  Surgeon: Paralee Cancel, MD;  Location: [facility];  Service: Orthopedics;  Laterality: Right;  . JOINT REPLACEMENT    . KNEE ARTHROSCOPY    . ORIF FEMUR FRACTURE Left 09/13/2018   Procedure: OPEN REDUCTION INTERNAL FIXATION (ORIF) DISTAL FEMUR FRACTURE;  Surgeon: Shona Needles, MD;  Location: [facility];  Service: Orthopedics;  Laterality: Left;  . PARTIAL COLECTOMY  2008  . ROTATOR CUFF REPAIR    . SHOULDER SURGERY    . VAGINAL PROLAPSE REPAIR       OB History   No obstetric history on file.     Family History  Problem Relation Age of Onset  . Lung cancer Sister   . Colon cancer Sister 34  . Kidney cancer Sister     Social History   Tobacco Use  . Smoking status: Never Smoker  . Smokeless tobacco: Never Used  Vaping Use  . Vaping Use: Never used  Substance Use Topics  . Alcohol use: Not Currently  . Drug use: Never    Home Medications Prior to Admission medications   Medication  Sig Start Date End Date Taking? Authorizing Provider  acetaminophen (TYLENOL) 500 MG tablet Take 500 mg by mouth 2 (two) times a day. FOR BACK PAIN   Yes [provider]  albuterol (VENTOLIN HFA) 108 (90 Base) MCG/ACT inhaler INHALE 2 PUFFS INTO THE LUNGS EVERY SIX HOURS AS NEEDED FOR WHEEZING OR SHORTNESS OF BREATH. 01/30/21 01/30/22 Yes Thurnell Lose, MD  apixaban (ELIQUIS) 5 MG TABS tablet Take 1 tablet (5 mg total) by mouth 2 (two) times daily. 02/27/21  Yes Hoyt Koch, MD  citalopram (CELEXA) 10 MG tablet Take 1 tablet (10 mg total) by mouth daily. 07/25/20  Yes Hoyt Koch, MD  Cranberry 500 MG TABS Take 500 mg by mouth in the morning and at bedtime.   Yes [provider]  diclofenac Sodium (VOLTAREN) 1 % GEL Apply 4 g topically 4 (four) times daily. 05/11/21  Yes Sharbel Sahagun S, PA  famotidine (PEPCID) 20 MG tablet Take 1 tablet (20 mg total) by mouth at bedtime. 07/25/20  Yes Hoyt Koch, MD  fexofenadine (ALLEGRA) 180 MG tablet Take 180 mg by mouth in the morning.   Yes [provider]  fluticasone (FLONASE) 50 MCG/ACT nasal spray Place 1 spray into both nostrils in the morning and at bedtime.   Yes [provider]  Multiple Vitamin (MULTIVITAMIN) tablet Take 1 tablet by mouth daily.   Yes [provider]  Multiple Vitamins-Minerals (PRESERVISION AREDS 2 PO) Take 1 tablet by mouth in the morning and at bedtime.   Yes [provider]  Cholecalciferol (VITAMIN D-3) 25 MCG (1000 UT) CAPS Take 1,000 Units by mouth daily. Patient not taking: Reported on 05/11/2021    [provider]  Vitamin D, Cholecalciferol, 25 MCG (1000 UT) TABS Take 1,000 mg by mouth daily. Patient not taking: No sig reported    [provider]  pantoprazole (PROTONIX) 40 MG tablet Take 1 tablet (40 mg total) by mouth daily. Patient not taking: Reported on 02/05/2021 01/30/21 02/05/21  Thurnell Lose, MD    Allergies     Morphine and related, Flexeril [cyclobenzaprine], Methenamine mandelate, Penicillins, Promethazine hcl, Statins, Ativan [lorazepam], and Morphine sulfate  Review of Systems   Review of Systems  Unable to perform ROS: Dementia    Physical Exam Updated Vital Signs BP (!) 181/71   Pulse 62   Temp 97.7 F (36.5 C) (Oral)   Resp 18   Ht 5\' 1"  (1.549 m)   Wt 63.5 kg   SpO2 94%   BMI 26.45 kg/m   Physical Exam Vitals and nursing note reviewed.  Constitutional:      General: She is not in acute distress.    Comments: Pleasantly  demented 87 year old female  HENT:     Head: Normocephalic and atraumatic.     Nose: Nose normal.  Eyes:     General: No scleral icterus. Cardiovascular:     Rate and Rhythm: Normal rate and regular rhythm.     Pulses: Normal pulses.     Heart sounds: Normal heart sounds.  Pulmonary:     Effort: Pulmonary effort is normal. No respiratory distress.     Breath sounds: No wheezing.  Abdominal:     Palpations: Abdomen is soft.     Tenderness: There is no abdominal tenderness.  Musculoskeletal:     Cervical back: Normal range of motion.     Right lower leg: No edema.     Left lower leg: No edema.     Comments: Patient's head and body is atraumatic with no bruising or abrasions or lacerations.  There is some tenderness to palpation of the right mid tibia and symptoms of palpation of the left distal femur just above the left knee joint.  On the anterior surface.  They feel that there is a small protuberance in this area.  Some mild tenderness palpation of the left hip as well.  Skin:    General: Skin is warm and dry.     Capillary Refill: Capillary refill takes less than 2 seconds.  Neurological:     Mental Status: She is alert. Mental status is at baseline.  Psychiatric:        Mood and Affect: Mood normal.        Behavior: Behavior normal.     ED Results / Procedures / Treatments   Labs (all labs ordered are listed, but only abnormal results  are displayed) Labs Reviewed  CBC WITH DIFFERENTIAL/PLATELET - Abnormal; Notable for the following components:      Result Value   RBC 3.70 (*)    Hemoglobin 10.9 (*)    HCT 34.7 (*)    All other components within normal limits  BASIC METABOLIC PANEL - Abnormal; Notable for the following components:   Glucose, Bld 101 (*)    Calcium 8.4 (*)    Anion gap 4 (*)    All other components within normal limits  RESP PANEL BY RT-PCR (FLU A&B, COVID) [id number]    EKG EKG Interpretation  Date/Time:  Saturday May 11 2021 12:10:14 EDT Ventricular Rate:  66 PR Interval:  178 QRS Duration: 99 QT Interval:  444 QTC Calculation: 466 R Axis:   -58 Text Interpretation: Sinus rhythm Left anterior fascicular block Abnormal R-wave progression, early transition Confirmed by Lennice Sites ([id number]) on 05/11/2021 2:37:50 PM   Radiology DG Tibia/Fibula Left  Result Date: 05/11/2021 CLINICAL DATA:  Difficulty bearing weight on the LEFT leg. No specific fall. EXAM: LEFT TIBIA AND FIBULA - 2 VIEW COMPARISON:  02/20/2013 FINDINGS: Remote ORIF of the LEFT femur and LEFT knee replacement. Hardware appears well seated. There is no acute fracture or subluxation. IMPRESSION: No evidence for acute  abnormality. Electronically Signed   By: Nolon Nations M.D.   On: 05/11/2021 14:23   DG Tibia/Fibula Right  Result Date: 05/11/2021 CLINICAL DATA:  Difficulty bearing weight on the LEFT leg. No specific fall. Pain in the RIGHT shin. EXAM: RIGHT TIBIA AND FIBULA - 2 VIEW COMPARISON:  None. FINDINGS: Degenerative changes are identified in the LATERAL compartment of the knee. There is no acute fracture or subluxation. Vascular calcifications are present. IMPRESSION: No evidence for acute  abnormality. Electronically Signed   By: Nolon Nations  M.D.   On: 05/11/2021 14:29   DG Knee Complete 4 Views Left  Result Date: 05/11/2021 CLINICAL DATA:  Fall.  Knee pain. EXAM: LEFT KNEE - COMPLETE 4+ VIEW COMPARISON:  09/13/2018  FINDINGS: Status post ORIF of the LEFT femur. Patient has remote knee arthroplasty. The hardware is well seated. No acute fracture or subluxation. Small joint effusion. IMPRESSION: 1. Small joint effusion.  No acute fracture. 2. Postoperative changes. Electronically Signed   By: Nolon Nations M.D.   On: 05/11/2021 15:46   DG Knee Complete 4 Views Right  Result Date: 05/11/2021 CLINICAL DATA:  Difficulty bearing weight on the LEFT leg. No specific fall. EXAM: RIGHT KNEE - COMPLETE 4+ VIEW COMPARISON:  None. FINDINGS: Remote ORIF of the RIGHT femur with intramedullary nail. There is mild degenerative change involving the LATERAL and patellofemoral compartments. No acute fracture or joint effusion. IMPRESSION: No evidence for acute  abnormality. Electronically Signed   By: Nolon Nations M.D.   On: 05/11/2021 14:30   DG HIP UNILAT WITH PELVIS 2-3 VIEWS LEFT  Result Date: 05/11/2021 CLINICAL DATA:  Difficulty weight-bearing on LEFT leg. Deformity of the LEFT thigh. No known trauma. EXAM: DG HIP (WITH OR WITHOUT PELVIS) 2-3V LEFT COMPARISON:  09/13/2018 FINDINGS: Remote LEFT femur ORIF. Mild degenerative changes are seen in the hip. No acute fracture. IMPRESSION: No evidence for acute  abnormality. Electronically Signed   By: Nolon Nations M.D.   On: 05/11/2021 14:22   DG HIP UNILAT WITH PELVIS 2-3 VIEWS RIGHT  Result Date: 05/11/2021 CLINICAL DATA:  Difficulty bearing weight on the LEFT leg. No specific fall. EXAM: DG HIP (WITH OR WITHOUT PELVIS) 2-3V RIGHT COMPARISON:  09/13/2018 FINDINGS: Interval RIGHT hip arthroplasty with lag screw and intramedullary nail. Suspect incomplete union along the LATERAL aspect of the proximal femoral shaft. There is no definite evidence for acute fracture or subluxation. Interval ORIF of the LEFT femur. No evidence for acute fracture of the pelvis. Detail is obscured at the symphysis pubis. Degenerative changes are seen in both hips. Remote lumbar spine fusion.  IMPRESSION: Remote ORIF of the RIGHT hip with suspected incomplete union laterally. No evidence for acute fracture. Electronically Signed   By: Nolon Nations M.D.   On: 05/11/2021 14:27   DG Femur Min 2 Views Left  Result Date: 05/11/2021 CLINICAL DATA:  87 year old who fell and complains of LEFT knee pain. Initial encounter. Prior ORIF of a LEFT femur fracture and prior LEFT total knee arthroplasty. EXAM: LEFT FEMUR 2 VIEWS COMPARISON:  09/13/2018. FINDINGS: No evidence of acute fracture. ORIF of a prior LEFT femur fracture with normal healing. Osseous demineralization. Hip joint anatomically aligned with a well-preserved joint space. IMPRESSION: 1. No acute osseous abnormality. 2. ORIF of a prior LEFT femur fracture with normal healing. Electronically Signed   By: Evangeline Dakin M.D.   On: 05/11/2021 15:47    Procedures Procedures   Medications Ordered in ED Medications - No data to display  ED Course  I have reviewed the triage vital signs and the nursing notes.  Pertinent labs & imaging results that were available during my care of the patient were reviewed by me and considered in my medical decision making (see chart for details).  Clinical Course as of 05/11/21 1613  Sat May 11, 2021  1556 DG of BLE without abnormalities.  NO fractures. Normal healing from ORIF.  [WF]    Clinical Course User Index [WF] Tedd Sias, [place]   MDM Rules/Calculators/A&P  Patient is at her mental baseline she is a 87 year old female with dementia she is here for evaluation because of left leg pain.  She seems to have been hesitant to put weight on her left leg this morning which prompted her daughter to bring her to the ER.  She does seem to have a somewhat abnormal protuberance on her left anterior thigh just above the left knee however does not warm, red, and with only minimal tenderness to palpation.  Basic lab work and [place] obtained.  X-ray of left femur, left  knee, left hip and left tib-fib unremarkable.  X-rays of right knee, right tib-fib, right hip and pelvis unremarkable.  CBC with mild anemia.  BMP unremarkable.  COVID test negative for influenza and COVID.  Discussed with daughter.  Patient will be discharged home at this time.  Family is understanding of plan and agreeable.  Splint removed from patient's left lower extremity.  She is moving it without difficulty now.  Final Clinical Impression(s) / ED Diagnoses Final diagnoses:  Pain of left lower extremity  Right leg pain    Rx / DC Orders ED Discharge Orders         Ordered    diclofenac Sodium (VOLTAREN) 1 % GEL  4 times daily        05/11/21 1606           Tedd Sias, [place] 05/11/21 1614    Tedd Sias, [place] 05/11/21 1615    Lennice Sites, DO 05/12/21 [phone]

## 2021-05-11 NOTE — Discharge Instructions (Signed)
Take your medications as prescribed.  Your xrays were without fractures. Good healing of your old fracture.

## 2021-05-13 DIAGNOSIS — I517 Cardiomegaly: Secondary | ICD-10-CM | POA: Diagnosis not present

## 2021-05-13 DIAGNOSIS — F0150 Vascular dementia without behavioral disturbance: Secondary | ICD-10-CM | POA: Diagnosis not present

## 2021-05-13 DIAGNOSIS — I77819 Aortic ectasia, unspecified site: Secondary | ICD-10-CM | POA: Diagnosis not present

## 2021-05-13 DIAGNOSIS — D509 Iron deficiency anemia, unspecified: Secondary | ICD-10-CM | POA: Diagnosis not present

## 2021-05-13 DIAGNOSIS — E785 Hyperlipidemia, unspecified: Secondary | ICD-10-CM | POA: Diagnosis not present

## 2021-05-13 DIAGNOSIS — S72141D Displaced intertrochanteric fracture of right femur, subsequent encounter for closed fracture with routine healing: Secondary | ICD-10-CM | POA: Diagnosis not present

## 2021-05-16 DIAGNOSIS — F0150 Vascular dementia without behavioral disturbance: Secondary | ICD-10-CM | POA: Diagnosis not present

## 2021-05-16 DIAGNOSIS — I517 Cardiomegaly: Secondary | ICD-10-CM | POA: Diagnosis not present

## 2021-05-16 DIAGNOSIS — D509 Iron deficiency anemia, unspecified: Secondary | ICD-10-CM | POA: Diagnosis not present

## 2021-05-16 DIAGNOSIS — S72141D Displaced intertrochanteric fracture of right femur, subsequent encounter for closed fracture with routine healing: Secondary | ICD-10-CM | POA: Diagnosis not present

## 2021-05-16 DIAGNOSIS — E785 Hyperlipidemia, unspecified: Secondary | ICD-10-CM | POA: Diagnosis not present

## 2021-05-16 DIAGNOSIS — I77819 Aortic ectasia, unspecified site: Secondary | ICD-10-CM | POA: Diagnosis not present

## 2021-05-17 DIAGNOSIS — Z23 Encounter for immunization: Secondary | ICD-10-CM | POA: Diagnosis not present

## 2021-05-20 DIAGNOSIS — D509 Iron deficiency anemia, unspecified: Secondary | ICD-10-CM | POA: Diagnosis not present

## 2021-05-20 DIAGNOSIS — I517 Cardiomegaly: Secondary | ICD-10-CM | POA: Diagnosis not present

## 2021-05-20 DIAGNOSIS — S72141D Displaced intertrochanteric fracture of right femur, subsequent encounter for closed fracture with routine healing: Secondary | ICD-10-CM | POA: Diagnosis not present

## 2021-05-20 DIAGNOSIS — E785 Hyperlipidemia, unspecified: Secondary | ICD-10-CM | POA: Diagnosis not present

## 2021-05-20 DIAGNOSIS — I77819 Aortic ectasia, unspecified site: Secondary | ICD-10-CM | POA: Diagnosis not present

## 2021-05-20 DIAGNOSIS — F0150 Vascular dementia without behavioral disturbance: Secondary | ICD-10-CM | POA: Diagnosis not present

## 2021-05-23 DIAGNOSIS — I77819 Aortic ectasia, unspecified site: Secondary | ICD-10-CM | POA: Diagnosis not present

## 2021-05-23 DIAGNOSIS — E785 Hyperlipidemia, unspecified: Secondary | ICD-10-CM | POA: Diagnosis not present

## 2021-05-23 DIAGNOSIS — S72141D Displaced intertrochanteric fracture of right femur, subsequent encounter for closed fracture with routine healing: Secondary | ICD-10-CM | POA: Diagnosis not present

## 2021-05-23 DIAGNOSIS — I517 Cardiomegaly: Secondary | ICD-10-CM | POA: Diagnosis not present

## 2021-05-23 DIAGNOSIS — F0150 Vascular dementia without behavioral disturbance: Secondary | ICD-10-CM | POA: Diagnosis not present

## 2021-05-23 DIAGNOSIS — D509 Iron deficiency anemia, unspecified: Secondary | ICD-10-CM | POA: Diagnosis not present

## 2021-05-26 ENCOUNTER — Encounter: Payer: Self-pay | Admitting: Internal Medicine

## 2021-05-26 DIAGNOSIS — Z9189 Other specified personal risk factors, not elsewhere classified: Secondary | ICD-10-CM

## 2021-05-26 DIAGNOSIS — F03A0 Unspecified dementia, mild, without behavioral disturbance, psychotic disturbance, mood disturbance, and anxiety: Secondary | ICD-10-CM

## 2021-05-26 DIAGNOSIS — F0390 Unspecified dementia without behavioral disturbance: Secondary | ICD-10-CM

## 2021-05-28 DIAGNOSIS — E785 Hyperlipidemia, unspecified: Secondary | ICD-10-CM | POA: Diagnosis not present

## 2021-05-28 DIAGNOSIS — F0150 Vascular dementia without behavioral disturbance: Secondary | ICD-10-CM | POA: Diagnosis not present

## 2021-05-28 DIAGNOSIS — S72141D Displaced intertrochanteric fracture of right femur, subsequent encounter for closed fracture with routine healing: Secondary | ICD-10-CM | POA: Diagnosis not present

## 2021-05-28 DIAGNOSIS — D509 Iron deficiency anemia, unspecified: Secondary | ICD-10-CM | POA: Diagnosis not present

## 2021-05-28 DIAGNOSIS — I517 Cardiomegaly: Secondary | ICD-10-CM | POA: Diagnosis not present

## 2021-05-28 DIAGNOSIS — I77819 Aortic ectasia, unspecified site: Secondary | ICD-10-CM | POA: Diagnosis not present

## 2021-05-29 ENCOUNTER — Telehealth: Payer: Self-pay | Admitting: Internal Medicine

## 2021-05-29 NOTE — Telephone Encounter (Signed)
Verbal orders [facility] [phone]  PT 8 weeks beginning June 6th 1 time 1 week  2 times for 3 weeks 1 time one week Twice one week One time for two weeks  Speech Therapist for evaluation

## 2021-05-29 NOTE — Telephone Encounter (Signed)
See below

## 2021-05-30 NOTE — Telephone Encounter (Signed)
Spoke with Sree to give verbal orders for PT and speech evaluation. No other questions or concerns at this time.

## 2021-05-30 NOTE — Telephone Encounter (Signed)
Okay for verbals and for SLP also.

## 2021-06-01 DIAGNOSIS — Z7901 Long term (current) use of anticoagulants: Secondary | ICD-10-CM | POA: Diagnosis not present

## 2021-06-01 DIAGNOSIS — Z85038 Personal history of other malignant neoplasm of large intestine: Secondary | ICD-10-CM | POA: Diagnosis not present

## 2021-06-01 DIAGNOSIS — Z79899 Other long term (current) drug therapy: Secondary | ICD-10-CM | POA: Diagnosis not present

## 2021-06-01 DIAGNOSIS — Z8616 Personal history of COVID-19: Secondary | ICD-10-CM | POA: Diagnosis not present

## 2021-06-01 DIAGNOSIS — D509 Iron deficiency anemia, unspecified: Secondary | ICD-10-CM | POA: Diagnosis not present

## 2021-06-01 DIAGNOSIS — S72141D Displaced intertrochanteric fracture of right femur, subsequent encounter for closed fracture with routine healing: Secondary | ICD-10-CM | POA: Diagnosis not present

## 2021-06-01 DIAGNOSIS — Z9181 History of falling: Secondary | ICD-10-CM | POA: Diagnosis not present

## 2021-06-01 DIAGNOSIS — Z8673 Personal history of transient ischemic attack (TIA), and cerebral infarction without residual deficits: Secondary | ICD-10-CM | POA: Diagnosis not present

## 2021-06-01 DIAGNOSIS — F0150 Vascular dementia without behavioral disturbance: Secondary | ICD-10-CM | POA: Diagnosis not present

## 2021-06-01 DIAGNOSIS — E785 Hyperlipidemia, unspecified: Secondary | ICD-10-CM | POA: Diagnosis not present

## 2021-06-01 DIAGNOSIS — Z86718 Personal history of other venous thrombosis and embolism: Secondary | ICD-10-CM | POA: Diagnosis not present

## 2021-06-01 DIAGNOSIS — R1312 Dysphagia, oropharyngeal phase: Secondary | ICD-10-CM | POA: Diagnosis not present

## 2021-06-01 DIAGNOSIS — F32A Depression, unspecified: Secondary | ICD-10-CM | POA: Diagnosis not present

## 2021-06-01 DIAGNOSIS — I77819 Aortic ectasia, unspecified site: Secondary | ICD-10-CM | POA: Diagnosis not present

## 2021-06-01 DIAGNOSIS — I517 Cardiomegaly: Secondary | ICD-10-CM | POA: Diagnosis not present

## 2021-06-04 DIAGNOSIS — E785 Hyperlipidemia, unspecified: Secondary | ICD-10-CM | POA: Diagnosis not present

## 2021-06-04 DIAGNOSIS — I77819 Aortic ectasia, unspecified site: Secondary | ICD-10-CM | POA: Diagnosis not present

## 2021-06-04 DIAGNOSIS — S72141D Displaced intertrochanteric fracture of right femur, subsequent encounter for closed fracture with routine healing: Secondary | ICD-10-CM | POA: Diagnosis not present

## 2021-06-04 DIAGNOSIS — I517 Cardiomegaly: Secondary | ICD-10-CM | POA: Diagnosis not present

## 2021-06-04 DIAGNOSIS — D509 Iron deficiency anemia, unspecified: Secondary | ICD-10-CM | POA: Diagnosis not present

## 2021-06-04 DIAGNOSIS — F0150 Vascular dementia without behavioral disturbance: Secondary | ICD-10-CM | POA: Diagnosis not present

## 2021-06-05 DIAGNOSIS — S72141D Displaced intertrochanteric fracture of right femur, subsequent encounter for closed fracture with routine healing: Secondary | ICD-10-CM | POA: Diagnosis not present

## 2021-06-05 DIAGNOSIS — I77819 Aortic ectasia, unspecified site: Secondary | ICD-10-CM | POA: Diagnosis not present

## 2021-06-05 DIAGNOSIS — E785 Hyperlipidemia, unspecified: Secondary | ICD-10-CM | POA: Diagnosis not present

## 2021-06-05 DIAGNOSIS — D509 Iron deficiency anemia, unspecified: Secondary | ICD-10-CM | POA: Diagnosis not present

## 2021-06-05 DIAGNOSIS — I517 Cardiomegaly: Secondary | ICD-10-CM | POA: Diagnosis not present

## 2021-06-05 DIAGNOSIS — F0150 Vascular dementia without behavioral disturbance: Secondary | ICD-10-CM | POA: Diagnosis not present

## 2021-06-06 DIAGNOSIS — S72141D Displaced intertrochanteric fracture of right femur, subsequent encounter for closed fracture with routine healing: Secondary | ICD-10-CM | POA: Diagnosis not present

## 2021-06-06 DIAGNOSIS — D509 Iron deficiency anemia, unspecified: Secondary | ICD-10-CM | POA: Diagnosis not present

## 2021-06-06 DIAGNOSIS — I517 Cardiomegaly: Secondary | ICD-10-CM | POA: Diagnosis not present

## 2021-06-06 DIAGNOSIS — I77819 Aortic ectasia, unspecified site: Secondary | ICD-10-CM | POA: Diagnosis not present

## 2021-06-06 DIAGNOSIS — F0150 Vascular dementia without behavioral disturbance: Secondary | ICD-10-CM | POA: Diagnosis not present

## 2021-06-06 DIAGNOSIS — E785 Hyperlipidemia, unspecified: Secondary | ICD-10-CM | POA: Diagnosis not present

## 2021-06-10 DIAGNOSIS — E785 Hyperlipidemia, unspecified: Secondary | ICD-10-CM | POA: Diagnosis not present

## 2021-06-10 DIAGNOSIS — I517 Cardiomegaly: Secondary | ICD-10-CM | POA: Diagnosis not present

## 2021-06-10 DIAGNOSIS — F0150 Vascular dementia without behavioral disturbance: Secondary | ICD-10-CM | POA: Diagnosis not present

## 2021-06-10 DIAGNOSIS — I77819 Aortic ectasia, unspecified site: Secondary | ICD-10-CM | POA: Diagnosis not present

## 2021-06-10 DIAGNOSIS — D509 Iron deficiency anemia, unspecified: Secondary | ICD-10-CM | POA: Diagnosis not present

## 2021-06-10 DIAGNOSIS — S72141D Displaced intertrochanteric fracture of right femur, subsequent encounter for closed fracture with routine healing: Secondary | ICD-10-CM | POA: Diagnosis not present

## 2021-06-12 DIAGNOSIS — I517 Cardiomegaly: Secondary | ICD-10-CM | POA: Diagnosis not present

## 2021-06-12 DIAGNOSIS — S72141D Displaced intertrochanteric fracture of right femur, subsequent encounter for closed fracture with routine healing: Secondary | ICD-10-CM | POA: Diagnosis not present

## 2021-06-12 DIAGNOSIS — F0150 Vascular dementia without behavioral disturbance: Secondary | ICD-10-CM | POA: Diagnosis not present

## 2021-06-12 DIAGNOSIS — I77819 Aortic ectasia, unspecified site: Secondary | ICD-10-CM | POA: Diagnosis not present

## 2021-06-12 DIAGNOSIS — D509 Iron deficiency anemia, unspecified: Secondary | ICD-10-CM | POA: Diagnosis not present

## 2021-06-12 DIAGNOSIS — E785 Hyperlipidemia, unspecified: Secondary | ICD-10-CM | POA: Diagnosis not present

## 2021-06-14 DIAGNOSIS — E785 Hyperlipidemia, unspecified: Secondary | ICD-10-CM | POA: Diagnosis not present

## 2021-06-14 DIAGNOSIS — D509 Iron deficiency anemia, unspecified: Secondary | ICD-10-CM | POA: Diagnosis not present

## 2021-06-14 DIAGNOSIS — F0150 Vascular dementia without behavioral disturbance: Secondary | ICD-10-CM | POA: Diagnosis not present

## 2021-06-14 DIAGNOSIS — I517 Cardiomegaly: Secondary | ICD-10-CM | POA: Diagnosis not present

## 2021-06-14 DIAGNOSIS — I77819 Aortic ectasia, unspecified site: Secondary | ICD-10-CM | POA: Diagnosis not present

## 2021-06-14 DIAGNOSIS — S72141D Displaced intertrochanteric fracture of right femur, subsequent encounter for closed fracture with routine healing: Secondary | ICD-10-CM | POA: Diagnosis not present

## 2021-06-18 DIAGNOSIS — F0150 Vascular dementia without behavioral disturbance: Secondary | ICD-10-CM | POA: Diagnosis not present

## 2021-06-18 DIAGNOSIS — D509 Iron deficiency anemia, unspecified: Secondary | ICD-10-CM | POA: Diagnosis not present

## 2021-06-18 DIAGNOSIS — I77819 Aortic ectasia, unspecified site: Secondary | ICD-10-CM | POA: Diagnosis not present

## 2021-06-18 DIAGNOSIS — S72141D Displaced intertrochanteric fracture of right femur, subsequent encounter for closed fracture with routine healing: Secondary | ICD-10-CM | POA: Diagnosis not present

## 2021-06-18 DIAGNOSIS — E785 Hyperlipidemia, unspecified: Secondary | ICD-10-CM | POA: Diagnosis not present

## 2021-06-18 DIAGNOSIS — I517 Cardiomegaly: Secondary | ICD-10-CM | POA: Diagnosis not present

## 2021-06-21 DIAGNOSIS — I77819 Aortic ectasia, unspecified site: Secondary | ICD-10-CM | POA: Diagnosis not present

## 2021-06-21 DIAGNOSIS — E785 Hyperlipidemia, unspecified: Secondary | ICD-10-CM | POA: Diagnosis not present

## 2021-06-21 DIAGNOSIS — F0150 Vascular dementia without behavioral disturbance: Secondary | ICD-10-CM | POA: Diagnosis not present

## 2021-06-21 DIAGNOSIS — D509 Iron deficiency anemia, unspecified: Secondary | ICD-10-CM | POA: Diagnosis not present

## 2021-06-21 DIAGNOSIS — S72141D Displaced intertrochanteric fracture of right femur, subsequent encounter for closed fracture with routine healing: Secondary | ICD-10-CM | POA: Diagnosis not present

## 2021-06-21 DIAGNOSIS — I517 Cardiomegaly: Secondary | ICD-10-CM | POA: Diagnosis not present

## 2021-07-01 DIAGNOSIS — D509 Iron deficiency anemia, unspecified: Secondary | ICD-10-CM | POA: Diagnosis not present

## 2021-07-01 DIAGNOSIS — S72141D Displaced intertrochanteric fracture of right femur, subsequent encounter for closed fracture with routine healing: Secondary | ICD-10-CM | POA: Diagnosis not present

## 2021-07-01 DIAGNOSIS — I517 Cardiomegaly: Secondary | ICD-10-CM | POA: Diagnosis not present

## 2021-07-01 DIAGNOSIS — F0150 Vascular dementia without behavioral disturbance: Secondary | ICD-10-CM | POA: Diagnosis not present

## 2021-07-01 DIAGNOSIS — Z7901 Long term (current) use of anticoagulants: Secondary | ICD-10-CM | POA: Diagnosis not present

## 2021-07-01 DIAGNOSIS — R1312 Dysphagia, oropharyngeal phase: Secondary | ICD-10-CM | POA: Diagnosis not present

## 2021-07-01 DIAGNOSIS — E785 Hyperlipidemia, unspecified: Secondary | ICD-10-CM | POA: Diagnosis not present

## 2021-07-01 DIAGNOSIS — Z8673 Personal history of transient ischemic attack (TIA), and cerebral infarction without residual deficits: Secondary | ICD-10-CM | POA: Diagnosis not present

## 2021-07-01 DIAGNOSIS — F32A Depression, unspecified: Secondary | ICD-10-CM | POA: Diagnosis not present

## 2021-07-01 DIAGNOSIS — I77819 Aortic ectasia, unspecified site: Secondary | ICD-10-CM | POA: Diagnosis not present

## 2021-07-01 DIAGNOSIS — Z79899 Other long term (current) drug therapy: Secondary | ICD-10-CM | POA: Diagnosis not present

## 2021-07-01 DIAGNOSIS — Z8616 Personal history of COVID-19: Secondary | ICD-10-CM | POA: Diagnosis not present

## 2021-07-01 DIAGNOSIS — Z9181 History of falling: Secondary | ICD-10-CM | POA: Diagnosis not present

## 2021-07-01 DIAGNOSIS — Z86718 Personal history of other venous thrombosis and embolism: Secondary | ICD-10-CM | POA: Diagnosis not present

## 2021-07-01 DIAGNOSIS — Z85038 Personal history of other malignant neoplasm of large intestine: Secondary | ICD-10-CM | POA: Diagnosis not present

## 2021-07-02 DIAGNOSIS — Z96652 Presence of left artificial knee joint: Secondary | ICD-10-CM | POA: Diagnosis not present

## 2021-07-02 DIAGNOSIS — M25562 Pain in left knee: Secondary | ICD-10-CM | POA: Diagnosis not present

## 2021-07-04 ENCOUNTER — Encounter: Payer: Self-pay | Admitting: Internal Medicine

## 2021-07-05 DIAGNOSIS — D509 Iron deficiency anemia, unspecified: Secondary | ICD-10-CM | POA: Diagnosis not present

## 2021-07-05 DIAGNOSIS — S72141D Displaced intertrochanteric fracture of right femur, subsequent encounter for closed fracture with routine healing: Secondary | ICD-10-CM | POA: Diagnosis not present

## 2021-07-05 DIAGNOSIS — F0150 Vascular dementia without behavioral disturbance: Secondary | ICD-10-CM | POA: Diagnosis not present

## 2021-07-05 DIAGNOSIS — E785 Hyperlipidemia, unspecified: Secondary | ICD-10-CM | POA: Diagnosis not present

## 2021-07-05 DIAGNOSIS — I77819 Aortic ectasia, unspecified site: Secondary | ICD-10-CM | POA: Diagnosis not present

## 2021-07-05 DIAGNOSIS — I517 Cardiomegaly: Secondary | ICD-10-CM | POA: Diagnosis not present

## 2021-07-08 DIAGNOSIS — S72141D Displaced intertrochanteric fracture of right femur, subsequent encounter for closed fracture with routine healing: Secondary | ICD-10-CM | POA: Diagnosis not present

## 2021-07-08 DIAGNOSIS — D509 Iron deficiency anemia, unspecified: Secondary | ICD-10-CM | POA: Diagnosis not present

## 2021-07-08 DIAGNOSIS — F0150 Vascular dementia without behavioral disturbance: Secondary | ICD-10-CM | POA: Diagnosis not present

## 2021-07-08 DIAGNOSIS — E785 Hyperlipidemia, unspecified: Secondary | ICD-10-CM | POA: Diagnosis not present

## 2021-07-08 DIAGNOSIS — I77819 Aortic ectasia, unspecified site: Secondary | ICD-10-CM | POA: Diagnosis not present

## 2021-07-08 DIAGNOSIS — I517 Cardiomegaly: Secondary | ICD-10-CM | POA: Diagnosis not present

## 2021-07-11 DIAGNOSIS — E785 Hyperlipidemia, unspecified: Secondary | ICD-10-CM | POA: Diagnosis not present

## 2021-07-11 DIAGNOSIS — I517 Cardiomegaly: Secondary | ICD-10-CM | POA: Diagnosis not present

## 2021-07-11 DIAGNOSIS — F0150 Vascular dementia without behavioral disturbance: Secondary | ICD-10-CM | POA: Diagnosis not present

## 2021-07-11 DIAGNOSIS — I77819 Aortic ectasia, unspecified site: Secondary | ICD-10-CM | POA: Diagnosis not present

## 2021-07-11 DIAGNOSIS — S72141D Displaced intertrochanteric fracture of right femur, subsequent encounter for closed fracture with routine healing: Secondary | ICD-10-CM | POA: Diagnosis not present

## 2021-07-11 DIAGNOSIS — D509 Iron deficiency anemia, unspecified: Secondary | ICD-10-CM | POA: Diagnosis not present

## 2021-07-15 DIAGNOSIS — E785 Hyperlipidemia, unspecified: Secondary | ICD-10-CM | POA: Diagnosis not present

## 2021-07-15 DIAGNOSIS — F0150 Vascular dementia without behavioral disturbance: Secondary | ICD-10-CM | POA: Diagnosis not present

## 2021-07-15 DIAGNOSIS — S72141D Displaced intertrochanteric fracture of right femur, subsequent encounter for closed fracture with routine healing: Secondary | ICD-10-CM | POA: Diagnosis not present

## 2021-07-15 DIAGNOSIS — D509 Iron deficiency anemia, unspecified: Secondary | ICD-10-CM | POA: Diagnosis not present

## 2021-07-15 DIAGNOSIS — I517 Cardiomegaly: Secondary | ICD-10-CM | POA: Diagnosis not present

## 2021-07-15 DIAGNOSIS — I77819 Aortic ectasia, unspecified site: Secondary | ICD-10-CM | POA: Diagnosis not present

## 2021-07-18 ENCOUNTER — Encounter: Payer: Self-pay | Admitting: Internal Medicine

## 2021-07-18 ENCOUNTER — Other Ambulatory Visit: Payer: Self-pay | Admitting: Internal Medicine

## 2021-07-24 DIAGNOSIS — I77819 Aortic ectasia, unspecified site: Secondary | ICD-10-CM | POA: Diagnosis not present

## 2021-07-24 DIAGNOSIS — D509 Iron deficiency anemia, unspecified: Secondary | ICD-10-CM | POA: Diagnosis not present

## 2021-07-24 DIAGNOSIS — S72141D Displaced intertrochanteric fracture of right femur, subsequent encounter for closed fracture with routine healing: Secondary | ICD-10-CM | POA: Diagnosis not present

## 2021-07-24 DIAGNOSIS — E785 Hyperlipidemia, unspecified: Secondary | ICD-10-CM | POA: Diagnosis not present

## 2021-07-24 DIAGNOSIS — F0150 Vascular dementia without behavioral disturbance: Secondary | ICD-10-CM | POA: Diagnosis not present

## 2021-07-24 DIAGNOSIS — I517 Cardiomegaly: Secondary | ICD-10-CM | POA: Diagnosis not present

## 2021-07-26 ENCOUNTER — Encounter: Payer: Self-pay | Admitting: Internal Medicine

## 2021-07-29 ENCOUNTER — Other Ambulatory Visit: Payer: Self-pay

## 2021-07-29 MED ORDER — FAMOTIDINE 20 MG PO TABS: 20.0000 mg | ORAL_TABLET | Freq: Every day | ORAL | 3 refills | Status: DC

## 2021-08-05 ENCOUNTER — Ambulatory Visit (INDEPENDENT_AMBULATORY_CARE_PROVIDER_SITE_OTHER): Payer: Medicare Other | Admitting: Pharmacist

## 2021-08-05 ENCOUNTER — Other Ambulatory Visit: Payer: Self-pay

## 2021-08-05 DIAGNOSIS — E785 Hyperlipidemia, unspecified: Secondary | ICD-10-CM

## 2021-08-05 DIAGNOSIS — F325 Major depressive disorder, single episode, in full remission: Secondary | ICD-10-CM | POA: Diagnosis not present

## 2021-08-05 DIAGNOSIS — Z8673 Personal history of transient ischemic attack (TIA), and cerebral infarction without residual deficits: Secondary | ICD-10-CM

## 2021-08-05 NOTE — Patient Instructions (Signed)
Visit Information  Phone number for Pharmacist: [phone]   Goals Addressed             This Visit's Progress    Manage My Medicine       Timeframe:  Long-Range Goal Priority:  High Start Date:      02/05/21                       Expected End Date:    02/05/22              Follow Up Date: Feb 2023   - call for medicine refill 2 or 3 days before it runs out - call if I am sick and can't take my medicine - keep a list of all the medicines I take; vitamins and herbals too - use a pillbox to sort medicine    Why is this important?   These steps will help you keep on track with your medicines.         Care Plan : [place]  Updates made by Charlton Haws, RPH since 08/05/2021 12:00 AM     Problem: Hyperlipidemia, GERD, Depression, Allergic Rhinitis and DVT   Priority: High     Goal: Disease Management   Start Date: 02/05/2021  Expected End Date: 08/05/2022  This Visit's Progress: On track  Recent Progress: On track  Priority: High  Note:   Current Barriers:  Unable to independently afford treatment regimen Unable to independently monitor therapeutic efficacy  Pharmacist Clinical Goal(s):  Patient will verbalize ability to afford treatment regimen achieve adherence to monitoring guidelines and medication adherence to achieve therapeutic efficacy through collaboration with PharmD and provider.   Interventions: 1:1 collaboration with Hoyt Koch, MD regarding development and update of comprehensive plan of care as evidenced by provider attestation and co-signature Inter-disciplinary care team collaboration (see longitudinal plan of care) Comprehensive medication review performed; medication list updated in electronic medical record  Hyperlipidemia / hx CVA (LDL goal < 70) -Not ideally controlled - pt is currently off of statins, blood thinners after stopping Eliquis last month; of note, pt was stable on clopidogrel prior to DVT in Jan when  Eliquis was started and decision was made to d/c clopidogrel -Current treatment: None  -Medications previously tried: clopidogrel, "statins" -Educated on history of CVA, role of cholesterol in stroke; pt has not tolerated statins in the past and would not like to start one at age 87 -Pt may benefit from restarting antiplatelet therapy; recommend aspirin over clopidogrel, will discuss with PCP  Depression/Anxiety (Goal: manage symptoms) -Controlled - per pt/daughter report -Current treatment: Citalopram 10 mg daily -Medications previously tried/failed: none -Patient is satisfied with current regimen and denies issue -Recommended to continue current medication  Subacute DVT (Dx 01/27/21) (Goal: prevent subsequent DVT) -Controlled - treated with Eliquis 01/29/21 - 07/04/21. Eliquis stopped after hemarthrosis in her knee; pt denies further bleeding issues since d/c -DVT diagnosed in Jan 2022 hospitalization for hip fracture -Recommend to continue monitoring  Health Maintenance -Current therapy:  Albuterol HFA prn Cranberry 500 mg daily BID Fexofenadine 180 mg daily AM Fluticasone nasal spray BID Multivitamin Preservision Areds 2 -Patient is satisfied with current therapy and denies issues -Recommended to continue current medication  Patient Goals/Self-Care Activities Patient will:  - take medications as prescribed      Patient verbalizes understanding of instructions provided today and agrees to view in [place].  Telephone follow up appointment with pharmacy team member scheduled for:  6 months  Charlene Brooke, PharmD, [place], CPP Clinical Pharmacist [facility] [phone]

## 2021-08-05 NOTE — Progress Notes (Signed)
Chronic Care Management Pharmacy Note  08/05/2021 Name:  [name] Barker MRN:  [id number] DOB:  11/13/1921  Summary: -Pt stopped Eliquis ~1 month ago after completing ~5 months of treatment for subacute DVT; she reports no further bleeding issues -Pt was taking clopidogrel for hx of stroke prior to transitioning to Eliquis due to DVT  Recommendations/Changes made from today's visit: -Recommend restarting antiplatelet therapy - aspirin over clopidogrel due to lower bleed risk. Will discuss with PCP.   Subjective: [name] Barker is an 87 y.o. year old female who is a primary patient of Hoyt Koch, MD.  The CCM team was consulted for assistance with disease management and care coordination needs.    Engaged with patient by telephone for follow up visit in response to provider referral for pharmacy case management and/or care coordination services.   Consent to Services:  The patient was given information about Chronic Care Management services, agreed to services, and gave verbal consent prior to initiation of services.  Please see initial visit note for detailed documentation.   Patient Care Team: Hoyt Koch, MD as PCP - General (Internal Medicine) Ladell Pier, MD as Consulting Physician (Oncology) Ladene Artist, MD as Consulting Physician (Gastroenterology) Charlton Haws, [facility] as Pharmacist (Pharmacist) Hoyt Koch, MD (Internal Medicine)  Recent office visits: 07/25/20 Dr Sharlet Salina VV: f/u dementia, GERD, hx stroke. BP at goal off meds. Dementia worsening but declines medication, Continue celexa.  Recent consult visits: 01/26/21-01/30/21 hospitalization: mechanical fall w/ R hip fracture, post ORIF. Subacute DVT in R leg, rx' d Eliquis for tx. COVID 19 infection s/p 3 days remdesivir, rx'd medrol dosepak. Hx CVA - clopidogrel switched to Eliquis.  01/21/21 Dr Lyla Glassing (ortho surgery): f/u knee osteoarthritis  Objective:  Lab  Results  Component Value Date   CREATININE 0.51 05/11/2021   BUN 9 05/11/2021   GFR 75.20 02/12/2018   GFRNONAA >60 05/11/2021   GFRAA >60 04/20/2019   NA 136 05/11/2021   K 3.7 05/11/2021   CALCIUM 8.4 (L) 05/11/2021   CO2 30 05/11/2021    Lab Results  Component Value Date/Time   HGBA1C 5.3 04/01/2019 03:12 AM   HGBA1C 5.8 (H) 04/04/2012 01:42 PM   GFR 75.20 02/12/2018 03:54 PM   GFR 119.61 05/19/2016 11:39 AM    Last diabetic Eye exam: No results found for: HMDIABEYEEXA  Last diabetic Foot exam: No results found for: HMDIABFOOTEX   Lab Results  Component Value Date   CHOL 194 04/01/2019   HDL 42 04/01/2019   LDLCALC 137 (H) 04/01/2019   LDLDIRECT 133.6 09/26/2013   TRIG 75 04/01/2019   CHOLHDL 4.6 04/01/2019    Hepatic Function Latest Ref Rng & Units 01/28/2021 01/27/2021 04/20/2019  Total Protein 6.5 - 8.1 g/dL 6.1(L) 6.7 6.4(L)  Albumin 3.5 - 5.0 g/dL 2.9(L) 3.3(L) 3.4(L)  AST 15 - 41 U/L _0 ALT 0 - 44 U/L _1 Alk Phosphatase 38 - 126 U/L 43 57 75  Total Bilirubin 0.3 - 1.2 mg/dL 0.4 0.2(L) 0.6  Bilirubin, Direct 0.0 - 0.3 mg/dL - - -    Lab Results  Component Value Date/Time   TSH 3.50 02/12/2018 03:54 PM   TSH 3.00 03/19/2016 03:23 PM   FREET4 0.65 02/12/2018 03:54 PM    CBC Latest Ref Rng & Units 05/11/2021 01/30/2021 01/29/2021  WBC 4.0 - 10.5 K/uL 6.1 11.9(H) -  Hemoglobin 12.0 - 15.0 g/dL 10.9(L) 8.7(L) 8.7(L)  Hematocrit 36.0 - 46.0 %  34.7(L) 27.4(L) 26.2(L)  Platelets 150 - 400 K/uL 241 237 -    Lab Results  Component Value Date/Time   VD25OH 50.12 02/12/2018 03:54 PM   VD25OH 23.94 (L) 11/02/2015 02:44 PM    Clinical ASCVD: Yes  The ASCVD Risk score Mikey Bussing DC Jr., et al., 2013) failed to calculate for the following reasons:   The 2013 ASCVD risk score is only valid for ages 59 to 28    Depression screen PHQ 2/9 07/27/2020 08/04/2017 11/02/2015  Decreased Interest 0 0 0  Down, Depressed, Hopeless 0 1 0  PHQ - 2 Score 0 1 0  Altered  sleeping - 0 -  Tired, decreased energy - 1 -  Change in appetite - 0 -  Feeling bad or failure about yourself  - 0 -  Trouble concentrating - 0 -  Moving slowly or fidgety/restless - 0 -  Suicidal thoughts - 0 -  PHQ-9 Score - 2 -  Difficult doing work/chores - Not difficult at all -  Some recent data might be hidden     Social History   Tobacco Use  Smoking Status Never  Smokeless Tobacco Never   BP Readings from Last 3 Encounters:  05/11/21 (!) 186/88  01/30/21 (!) 152/49  04/20/19 (!) 163/66   Pulse Readings from Last 3 Encounters:  05/11/21 63  01/30/21 72  04/20/19 (!) 57   Wt Readings from Last 3 Encounters:  05/11/21 139 lb 15.9 oz (63.5 kg)  01/26/21 140 lb (63.5 kg)  03/31/19 121 lb 4.1 oz (55 kg)    Assessment/Interventions: Review of patient past medical history, allergies, medications, health status, including review of consultants reports, laboratory and other test data, was performed as part of comprehensive evaluation and provision of chronic care management services.   SDOH:  (Social Determinants of Health) assessments and interventions performed: Yes    CCM Care Plan  Allergies  Allergen Reactions   Morphine And Related Other (See Comments)    Blisters at site and along vein   Flexeril [Cyclobenzaprine] Other (See Comments)    Nightmares    Methenamine Mandelate Other (See Comments)    Burning sensation   Penicillins Hives    Did it involve swelling of the face/tongue/throat, SOB, or low BP? Yes Did it involve sudden or severe rash/hives, skin peeling, or any reaction on the inside of your mouth or nose? Unk Did you need to seek medical attention at a hospital or doctor's office? Unk When did it last happen? Childhood If all above answers are "NO", may proceed with cephalosporin use.    Promethazine Hcl Other (See Comments)    Hallucinations   Statins Other (See Comments)    Muscle pain    Ativan [Lorazepam] Anxiety   Morphine Sulfate  Rash and Other (See Comments)    Blisters all over body    Medications Reviewed Today     Reviewed by Charlton Haws, [facility] (Pharmacist) on 08/05/21 at 1316  Med List Status: <None>   Medication Order Taking? Sig Documenting Provider Last Dose Status Informant  acetaminophen (TYLENOL) 500 MG tablet [id number] Yes Take 500 mg by mouth 2 (two) times a day. FOR BACK PAIN [provider] Taking Active Family Member  albuterol (VENTOLIN HFA) 108 (90 Base) MCG/ACT inhaler [id number] Yes INHALE 2 PUFFS INTO THE LUNGS EVERY SIX HOURS AS NEEDED FOR WHEEZING OR SHORTNESS OF BREATH. Thurnell Lose, MD Taking Active Family Member  citalopram (CELEXA) 10 MG tablet [id number] Yes Take 1 tablet  by mouth once daily Hoyt Koch, MD Taking Active   Cranberry 500 MG TABS [id number] Yes Take 500 mg by mouth in the morning and at bedtime. [provider] Taking Active Family Member  diclofenac Sodium (VOLTAREN) 1 % GEL [id number] Yes Apply 4 g topically 4 (four) times daily. Tedd Sias, [place] Taking Active   famotidine (PEPCID) 20 MG tablet [id number] Yes Take 1 tablet (20 mg total) by mouth at bedtime. Hoyt Koch, MD Taking Active   fexofenadine [facility]) 180 MG tablet [id number] Yes Take 180 mg by mouth in the morning. [provider] Taking Active Family Member  fluticasone (FLONASE) 50 MCG/ACT nasal spray [id number] Yes Place 1 spray into both nostrils in the morning and at bedtime. [provider] Taking Active Family Member  Multiple Vitamin (MULTIVITAMIN) tablet [id number] Yes Take 1 tablet by mouth daily. [provider] Taking Active Family Member  Multiple Vitamins-Minerals (PRESERVISION AREDS 2 PO) [id number] Yes Take 1 tablet by mouth in the morning and at bedtime. [provider] Taking Active Family Member   Patient not taking:   Discontinued 02/05/21 1418 (Patient Preference)             Patient Active Problem List    Diagnosis Date Noted   Closed right hip fracture, with routine healing, subsequent encounter 01/26/2021   History of CVA (cerebrovascular accident)    COVID-19    DNR (do not resuscitate)    Hx of completed stroke 03/31/2019   Vasomotor rhinitis 11/17/2018   Irregular heartbeat 09/21/2017   Mild dementia ([place]) 08/10/2017   Bilateral hearing loss 08/07/2016   Depression 08/07/2016   Vitamin D deficiency 11/04/2015   Routine general medical examination at a health care facility 06/08/2015   CAD 06/21/2010   LOW BACK PAIN, CHRONIC 02/04/2010   ADENOCARCINOMA, COLON 08/19/2007   Hyperlipidemia 11/17/2006   ANEMIA-NOS 11/17/2006   GERD 11/17/2006   OSTEOPENIA 11/17/2006    Immunization History  Administered Date(s) Administered   Influenza Split 09/29/2011   Influenza Whole 10/03/2008, 09/28/2012   Influenza, High Dose Seasonal PF 09/14/2018   Influenza,inj,Quad PF,6+ Mos 09/12/2013   Influenza,trivalent, recombinat, inj, PF 09/26/2013   Influenza-Unspecified 09/28/2014, 10/13/2015, 09/25/2017, 09/14/2018, 09/24/2019   PFIZER(Purple Top)SARS-COV-2 Vaccination 02/26/2020, 03/21/2020, 05/17/2021   Pneumococcal Conjugate-13 06/07/2015   Pneumococcal Polysaccharide-23 10/29/2006   Td 12/29/1994   Tdap 07/18/2011   Zoster, Live 08/25/2011    Conditions to be addressed/monitored:  Hyperlipidemia, GERD, Depression, Allergic Rhinitis and DVT  Care Plan : [place]  Updates made by Charlton Haws, [place] since 08/05/2021 12:00 AM     Problem: Hyperlipidemia, GERD, Depression, Allergic Rhinitis and DVT   Priority: High     Goal: Disease Management   Start Date: 02/05/2021  Expected End Date: 08/05/2022  This Visit's Progress: On track  Recent Progress: On track  Priority: High  Note:   Current Barriers:  Unable to independently afford treatment regimen Unable to independently monitor therapeutic efficacy  Pharmacist Clinical Goal(s):  Patient will verbalize  ability to afford treatment regimen achieve adherence to monitoring guidelines and medication adherence to achieve therapeutic efficacy through collaboration with PharmD and provider.   Interventions: 1:1 collaboration with Hoyt Koch, MD regarding development and update of comprehensive plan of care as evidenced by provider attestation and co-signature Inter-disciplinary care team collaboration (see longitudinal plan of care) Comprehensive medication review performed; medication list updated in electronic medical record  Hyperlipidemia / hx CVA (LDL goal < 70) -Not  ideally controlled - pt is currently off of statins, blood thinners after stopping Eliquis last month; of note, pt was stable on clopidogrel prior to DVT in Jan when Eliquis was started and decision was made to d/c clopidogrel -Current treatment: None  -Medications previously tried: clopidogrel, "statins" -Educated on history of CVA, role of cholesterol in stroke; pt has not tolerated statins in the past and would not like to start one at age 34 -Pt may benefit from restarting antiplatelet therapy; recommend aspirin over clopidogrel, will discuss with PCP  Depression/Anxiety (Goal: manage symptoms) -Controlled - per pt/daughter report -Current treatment: Citalopram 10 mg daily -Medications previously tried/failed: none -Patient is satisfied with current regimen and denies issue -Recommended to continue current medication  Subacute DVT (Dx 01/27/21) (Goal: prevent subsequent DVT) -Controlled - treated with Eliquis 01/29/21 - 07/04/21. Eliquis stopped after hemarthrosis in her knee; pt denies further bleeding issues since d/c -DVT diagnosed in Jan 2022 hospitalization for hip fracture -Recommend to continue monitoring  Health Maintenance -Current therapy:  Albuterol HFA prn Cranberry 500 mg daily BID Fexofenadine 180 mg daily AM Fluticasone nasal spray BID Multivitamin Preservision Areds 2 -Patient is satisfied  with current therapy and denies issues -Recommended to continue current medication  Patient Goals/Self-Care Activities Patient will:  - take medications as prescribed     Medication Assistance: None required.  Patient affirms current coverage meets needs.  Compliance/Adherence/Medication fill history: Care Gaps: Shingrix TDAP DEXA scan  Star-Rating Drugs: None  Patient's preferred pharmacy is:  [facility] [address] [place] [address] [place] [place] [address] Phone: [phone] Fax: [phone]  Uses pill box? Yes Pt endorses 100% compliance  We discussed: Current pharmacy is preferred with insurance plan and patient is satisfied with pharmacy services Patient decided to: Continue current medication management strategy  Follow Up:  Patient agrees to Care Plan and Follow-up.  Plan: Telephone follow up appointment with care management team member scheduled for:  6 months  Charlene Brooke, PharmD, [place], CPP Clinical Pharmacist [facility] [phone]

## 2021-08-06 NOTE — Progress Notes (Signed)
Spoke with PCP - no need to restart antiplatelet given patient age and fall risk.

## 2021-08-19 ENCOUNTER — Encounter: Payer: Self-pay | Admitting: Internal Medicine

## 2021-08-20 ENCOUNTER — Other Ambulatory Visit: Payer: Self-pay

## 2021-08-20 MED ORDER — ALBUTEROL SULFATE HFA 108 (90 BASE) MCG/ACT IN AERS: 2.0000 | INHALATION_SPRAY | Freq: Four times a day (QID) | RESPIRATORY_TRACT | 3 refills | Status: DC | PRN

## 2021-10-25 DIAGNOSIS — Z23 Encounter for immunization: Secondary | ICD-10-CM | POA: Diagnosis not present

## 2021-10-30 ENCOUNTER — Other Ambulatory Visit: Payer: Self-pay | Admitting: Internal Medicine

## 2021-11-03 DIAGNOSIS — Z20828 Contact with and (suspected) exposure to other viral communicable diseases: Secondary | ICD-10-CM | POA: Diagnosis not present

## 2021-11-07 ENCOUNTER — Ambulatory Visit: Payer: Medicare Other

## 2021-11-08 ENCOUNTER — Ambulatory Visit: Payer: Medicare Other | Admitting: Internal Medicine

## 2021-11-15 ENCOUNTER — Ambulatory Visit: Payer: Medicare Other

## 2021-11-15 ENCOUNTER — Ambulatory Visit: Payer: Medicare Other | Admitting: Internal Medicine

## 2021-11-25 ENCOUNTER — Encounter: Payer: Self-pay | Admitting: Internal Medicine

## 2021-11-25 ENCOUNTER — Other Ambulatory Visit: Payer: Self-pay

## 2021-11-25 ENCOUNTER — Ambulatory Visit (INDEPENDENT_AMBULATORY_CARE_PROVIDER_SITE_OTHER): Payer: Medicare Other | Admitting: Internal Medicine

## 2021-11-25 DIAGNOSIS — Z8673 Personal history of transient ischemic attack (TIA), and cerebral infarction without residual deficits: Secondary | ICD-10-CM

## 2021-11-25 DIAGNOSIS — E782 Mixed hyperlipidemia: Secondary | ICD-10-CM

## 2021-11-25 DIAGNOSIS — H9193 Unspecified hearing loss, bilateral: Secondary | ICD-10-CM | POA: Diagnosis not present

## 2021-11-25 DIAGNOSIS — K219 Gastro-esophageal reflux disease without esophagitis: Secondary | ICD-10-CM

## 2021-11-25 DIAGNOSIS — J300 Vasomotor rhinitis: Secondary | ICD-10-CM

## 2021-11-25 DIAGNOSIS — G301 Alzheimer's disease with late onset: Secondary | ICD-10-CM

## 2021-11-25 DIAGNOSIS — F02B0 Dementia in other diseases classified elsewhere, moderate, without behavioral disturbance, psychotic disturbance, mood disturbance, and anxiety: Secondary | ICD-10-CM

## 2021-11-25 DIAGNOSIS — I499 Cardiac arrhythmia, unspecified: Secondary | ICD-10-CM | POA: Diagnosis not present

## 2021-11-25 DIAGNOSIS — F3342 Major depressive disorder, recurrent, in full remission: Secondary | ICD-10-CM

## 2021-11-25 DIAGNOSIS — Z66 Do not resuscitate: Secondary | ICD-10-CM | POA: Diagnosis not present

## 2021-11-25 MED ORDER — FAMOTIDINE 20 MG PO TABS: 20.0000 mg | ORAL_TABLET | Freq: Every day | ORAL | 3 refills | Status: DC

## 2021-11-25 MED ORDER — DICLOFENAC SODIUM 1 % EX GEL: 4.0000 g | Freq: Four times a day (QID) | CUTANEOUS | 3 refills | Status: DC

## 2021-11-25 MED ORDER — ALBUTEROL SULFATE HFA 108 (90 BASE) MCG/ACT IN AERS: INHALATION_SPRAY | RESPIRATORY_TRACT | 3 refills | Status: DC

## 2021-11-25 MED ORDER — CITALOPRAM HYDROBROMIDE 10 MG PO TABS: 10.0000 mg | ORAL_TABLET | Freq: Every day | ORAL | 3 refills | Status: DC

## 2021-11-25 NOTE — Progress Notes (Signed)
   Subjective:   Patient ID: [name] Barker, female    DOB: 03/15/1921, 87 y.o.   MRN: [id number]  HPI The patient is a 87 YO female coming in for follow up medical conditions. Daughter is present and helps to provide history.   Review of Systems  Constitutional:  Positive for activity change.  HENT: Negative.    Eyes: Negative.   Respiratory:  Negative for cough, chest tightness and shortness of breath.   Cardiovascular:  Negative for chest pain, palpitations and leg swelling.  Gastrointestinal:  Negative for abdominal distention, abdominal pain, constipation, diarrhea, nausea and vomiting.  Musculoskeletal: Negative.   Skin: Negative.   Neurological: Negative.        Memory change  Psychiatric/Behavioral:  Positive for decreased concentration.    Objective:  Physical Exam Constitutional:      Appearance: She is well-developed.  HENT:     Head: Normocephalic and atraumatic.  Cardiovascular:     Rate and Rhythm: Normal rate and regular rhythm.  Pulmonary:     Effort: Pulmonary effort is normal. No respiratory distress.     Breath sounds: Normal breath sounds. No wheezing or rales.  Abdominal:     General: Bowel sounds are normal. There is no distension.     Palpations: Abdomen is soft.     Tenderness: There is no abdominal tenderness. There is no rebound.  Musculoskeletal:     Cervical back: Normal range of motion.  Skin:    General: Skin is warm and dry.  Neurological:     Mental Status: She is alert.     Coordination: Coordination abnormal.     Comments: Awake and oriented to person only    Vitals:   11/25/21 1508  BP: 124/70  Pulse: 65  Resp: 18  SpO2: 97%  Weight: 123 lb 9.6 oz (56.1 kg)  Height: 5\' 1"  (1.549 m)    This visit occurred during the SARS-CoV-2 public health emergency.  Safety protocols were in place, including screening questions prior to the visit, additional usage of staff PPE, and extensive cleaning of exam room while observing  appropriate contact time as indicated for disinfecting solutions.   Assessment & Plan:

## 2021-11-25 NOTE — Patient Instructions (Signed)
We have refilled the medicines.

## 2021-11-26 ENCOUNTER — Encounter: Payer: Self-pay | Admitting: Internal Medicine

## 2021-11-26 NOTE — Assessment & Plan Note (Signed)
Through shared decision making we elected no further lab work. She is not on medication at this time for cholesterol.

## 2021-11-26 NOTE — Assessment & Plan Note (Signed)
Citalopram is doing well for depression that we can tell. She did have some hallucinations prior to initiation of this. Discussed with daughter we could increase dosing if needed in future. Refilled today.

## 2021-11-26 NOTE — Assessment & Plan Note (Signed)
Taking pepcid only.

## 2021-11-26 NOTE — Assessment & Plan Note (Signed)
Uses flonase. Has albuterol for comfort for SOB symptoms.

## 2021-11-26 NOTE — Assessment & Plan Note (Signed)
Still in place for DNR and limited progression of care.

## 2021-11-26 NOTE — Assessment & Plan Note (Signed)
Without behavioral disturbance. She is unable to recognize family members often now. Some staring spells we talked about with husband and daughter could be seizure activity versus progression of memory changes. Goal of care is comfort at this time we will forgo any labs or imaging unless absolutely needed. She is comfortable at this time.

## 2021-11-26 NOTE — Assessment & Plan Note (Signed)
At this time they do not want further treatment. She does have A fib intermittent but did not tolerate eliquis or daily aspirin (recurrent hematoma in the knee). She is not on statin. BP at goal. No diabetes. We will not pursue labs at this time due to [place].

## 2021-11-26 NOTE — Assessment & Plan Note (Signed)
Stable overall and using bilateral aids.

## 2021-11-26 NOTE — Assessment & Plan Note (Signed)
Had tried to tolerate eliquis but had recurrent hematoma in the knee and even with aspirin this recurred. She is off anticoagulation and husband and daughter understand risk of recurrent stroke.

## 2021-12-24 ENCOUNTER — Encounter: Payer: Self-pay | Admitting: Internal Medicine

## 2021-12-24 ENCOUNTER — Telehealth: Payer: Self-pay

## 2021-12-24 NOTE — Progress Notes (Signed)
° ° °  Chronic Care Management Pharmacy Assistant   Name: [name] Barker  MRN: [id number] DOB: 06-05-1921    Reason for Encounter: Disease State-General    Recent office visits:  11/25/21 Hoyt Koch, MD-PCP (Yearly f/u) No orders or med changes  Recent consult visits:  None ID  Hospital visits:  None in previous 6 months  Medications: Outpatient Encounter Medications as of 12/24/2021  Medication Sig   acetaminophen (TYLENOL) 500 MG tablet Take 500 mg by mouth 2 (two) times a day. FOR BACK PAIN   albuterol (VENTOLIN HFA) 108 (90 Base) MCG/ACT inhaler INHALE 2 PUFFS BY MOUTH EVERY 6 HOURS AS NEEDED FOR WHEEZING OR FOR SHORTNESS OF BREATH   citalopram (CELEXA) 10 MG tablet Take 1 tablet (10 mg total) by mouth daily.   Cranberry 500 MG TABS Take 500 mg by mouth in the morning and at bedtime.   diclofenac Sodium (VOLTAREN) 1 % GEL Apply 4 g topically 4 (four) times daily.   famotidine (PEPCID) 20 MG tablet Take 1 tablet (20 mg total) by mouth at bedtime.   fexofenadine (ALLEGRA) 180 MG tablet Take 180 mg by mouth in the morning.   fluticasone (FLONASE) 50 MCG/ACT nasal spray Place 1 spray into both nostrils in the morning and at bedtime.   Multiple Vitamin (MULTIVITAMIN) tablet Take 1 tablet by mouth daily.   Multiple Vitamins-Minerals (PRESERVISION AREDS 2 PO) Take 1 tablet by mouth in the morning and at bedtime.   [DISCONTINUED] pantoprazole (PROTONIX) 40 MG tablet Take 1 tablet (40 mg total) by mouth daily. (Patient not taking: Reported on 02/05/2021)   No facility-administered encounter medications on file as of 12/24/2021.   Have you had any problems recently with your health? Spoke with patient daughter [name] who stated that her mother has dementia but no new health issues  Have you had any problems with your pharmacy?She stated that  the patient does not have any problems with getting medications or the cost of medications from the pharmacy  What issues or side  effects are you having with your medications?She stated that the patient does not have any side effects to medications that she is aware of.  What would you like me to pass along to [facility] for them to help you with? She stated just to let him know her mother is doing fine  What can we do to take care of you better? She stated there is nothing that is needed at this time  Care Gaps: Colonoscopy-NA Diabetic Foot Exam-NA Mammogram-NA Ophthalmology-NA Dexa Scan -NA  Annual Well Visit - 11/25/21 Micro albumin-NA Hemoglobin A1c- NA  Star Rating Drugs: None ID   Ethelene Hal Clinical Pharmacist Assistant [phone]

## 2021-12-24 NOTE — Telephone Encounter (Signed)
Over the last 2 days, [name] Barker has been increasingly confused. Last night was the worst. She was unable to settle and was awake and getting up out of bed constantly through the night.  She doesn't recognize her husband or me, and wants to "go home".  She has no safety awareness, and keeps trying to get up without her walker or [place] to assist her. We gave her a dose of hydroxyzine 10mg  last night in an attempt to help her (and [place]!) get some sleep, but it seemed to make her more confused. This had been previously prescribed for her by an on call physician about 1-2 years ago, when she had a similar episode. I didn't wind up using it at the time. She has been awake more than 24 hours now. She's wide awake though very restless. She is able to walk with her walker with get up with much assistance and verbal/hands on cuing. She doesn't speak in complete sentences, and is off topic. She doesn't have a temperature, and is not dizzy.  I don't think she has an infection. Her urine is clear and not foul smelling. But just in case,  I started giving her 500mg  Amoxicillin bid, starting last evening. No change.   We're thinking that she may having progression of her microvascular dementia.    She is a DNR, and we really don't want to put her through a work up.   My question is, could I have your permission to try her on Depakote 250mg  qd or bid, in the hopes of helping her get some rest at night? I've used this in my elderly palliative care patients in the past, usually to a good calming effect.

## 2021-12-24 NOTE — Telephone Encounter (Signed)
Patient's daughter checking status of mychart response   Daughter requesting a call back   Violeta Gelinas [phone]

## 2021-12-27 MED ORDER — SULFAMETHOXAZOLE-TRIMETHOPRIM 800-160 MG PO TABS: 1.0000 | ORAL_TABLET | Freq: Two times a day (BID) | ORAL | 0 refills | Status: DC

## 2021-12-27 NOTE — Telephone Encounter (Signed)
Spoke with the patient's daughter and she mentioned that she had given her mother Amoxicillin 500 mg bid and she stated her mother has returned to her baseline. Pt's daughter thinks it may have been a UTI. She would like to have a standing order for antibiotic. She declined wanting to take her mother to ED.

## 2022-01-22 ENCOUNTER — Encounter: Payer: Self-pay | Admitting: Internal Medicine

## 2022-01-22 DIAGNOSIS — Z8673 Personal history of transient ischemic attack (TIA), and cerebral infarction without residual deficits: Secondary | ICD-10-CM

## 2022-01-31 DIAGNOSIS — I82401 Acute embolism and thrombosis of unspecified deep veins of right lower extremity: Secondary | ICD-10-CM | POA: Diagnosis not present

## 2022-01-31 DIAGNOSIS — U071 COVID-19: Secondary | ICD-10-CM | POA: Diagnosis not present

## 2022-01-31 DIAGNOSIS — W19XXXD Unspecified fall, subsequent encounter: Secondary | ICD-10-CM | POA: Diagnosis not present

## 2022-01-31 DIAGNOSIS — Z8673 Personal history of transient ischemic attack (TIA), and cerebral infarction without residual deficits: Secondary | ICD-10-CM | POA: Diagnosis not present

## 2022-01-31 DIAGNOSIS — F339 Major depressive disorder, recurrent, unspecified: Secondary | ICD-10-CM | POA: Diagnosis not present

## 2022-01-31 DIAGNOSIS — H9193 Unspecified hearing loss, bilateral: Secondary | ICD-10-CM | POA: Diagnosis not present

## 2022-01-31 DIAGNOSIS — Z9181 History of falling: Secondary | ICD-10-CM | POA: Diagnosis not present

## 2022-01-31 DIAGNOSIS — E782 Mixed hyperlipidemia: Secondary | ICD-10-CM | POA: Diagnosis not present

## 2022-01-31 DIAGNOSIS — S72001D Fracture of unspecified part of neck of right femur, subsequent encounter for closed fracture with routine healing: Secondary | ICD-10-CM | POA: Diagnosis not present

## 2022-01-31 DIAGNOSIS — G301 Alzheimer's disease with late onset: Secondary | ICD-10-CM | POA: Diagnosis not present

## 2022-01-31 DIAGNOSIS — K219 Gastro-esophageal reflux disease without esophagitis: Secondary | ICD-10-CM | POA: Diagnosis not present

## 2022-01-31 DIAGNOSIS — F02B18 Dementia in other diseases classified elsewhere, moderate, with other behavioral disturbance: Secondary | ICD-10-CM | POA: Diagnosis not present

## 2022-02-03 ENCOUNTER — Telehealth: Payer: Medicare Other

## 2022-02-03 DIAGNOSIS — I82401 Acute embolism and thrombosis of unspecified deep veins of right lower extremity: Secondary | ICD-10-CM | POA: Diagnosis not present

## 2022-02-03 DIAGNOSIS — F02B18 Dementia in other diseases classified elsewhere, moderate, with other behavioral disturbance: Secondary | ICD-10-CM | POA: Diagnosis not present

## 2022-02-03 DIAGNOSIS — U071 COVID-19: Secondary | ICD-10-CM | POA: Diagnosis not present

## 2022-02-03 DIAGNOSIS — G301 Alzheimer's disease with late onset: Secondary | ICD-10-CM | POA: Diagnosis not present

## 2022-02-03 DIAGNOSIS — F339 Major depressive disorder, recurrent, unspecified: Secondary | ICD-10-CM | POA: Diagnosis not present

## 2022-02-03 DIAGNOSIS — S72001D Fracture of unspecified part of neck of right femur, subsequent encounter for closed fracture with routine healing: Secondary | ICD-10-CM | POA: Diagnosis not present

## 2022-02-10 DIAGNOSIS — F02B18 Dementia in other diseases classified elsewhere, moderate, with other behavioral disturbance: Secondary | ICD-10-CM | POA: Diagnosis not present

## 2022-02-10 DIAGNOSIS — S72001D Fracture of unspecified part of neck of right femur, subsequent encounter for closed fracture with routine healing: Secondary | ICD-10-CM | POA: Diagnosis not present

## 2022-02-10 DIAGNOSIS — U071 COVID-19: Secondary | ICD-10-CM | POA: Diagnosis not present

## 2022-02-10 DIAGNOSIS — G301 Alzheimer's disease with late onset: Secondary | ICD-10-CM | POA: Diagnosis not present

## 2022-02-10 DIAGNOSIS — I82401 Acute embolism and thrombosis of unspecified deep veins of right lower extremity: Secondary | ICD-10-CM | POA: Diagnosis not present

## 2022-02-10 DIAGNOSIS — F339 Major depressive disorder, recurrent, unspecified: Secondary | ICD-10-CM | POA: Diagnosis not present

## 2022-02-13 DIAGNOSIS — F339 Major depressive disorder, recurrent, unspecified: Secondary | ICD-10-CM | POA: Diagnosis not present

## 2022-02-13 DIAGNOSIS — U071 COVID-19: Secondary | ICD-10-CM | POA: Diagnosis not present

## 2022-02-13 DIAGNOSIS — I82401 Acute embolism and thrombosis of unspecified deep veins of right lower extremity: Secondary | ICD-10-CM | POA: Diagnosis not present

## 2022-02-13 DIAGNOSIS — S72001D Fracture of unspecified part of neck of right femur, subsequent encounter for closed fracture with routine healing: Secondary | ICD-10-CM | POA: Diagnosis not present

## 2022-02-13 DIAGNOSIS — G301 Alzheimer's disease with late onset: Secondary | ICD-10-CM | POA: Diagnosis not present

## 2022-02-13 DIAGNOSIS — Z20822 Contact with and (suspected) exposure to covid-19: Secondary | ICD-10-CM | POA: Diagnosis not present

## 2022-02-13 DIAGNOSIS — F02B18 Dementia in other diseases classified elsewhere, moderate, with other behavioral disturbance: Secondary | ICD-10-CM | POA: Diagnosis not present

## 2022-02-17 DIAGNOSIS — S72001D Fracture of unspecified part of neck of right femur, subsequent encounter for closed fracture with routine healing: Secondary | ICD-10-CM | POA: Diagnosis not present

## 2022-02-17 DIAGNOSIS — G301 Alzheimer's disease with late onset: Secondary | ICD-10-CM | POA: Diagnosis not present

## 2022-02-17 DIAGNOSIS — F339 Major depressive disorder, recurrent, unspecified: Secondary | ICD-10-CM | POA: Diagnosis not present

## 2022-02-17 DIAGNOSIS — F02B18 Dementia in other diseases classified elsewhere, moderate, with other behavioral disturbance: Secondary | ICD-10-CM | POA: Diagnosis not present

## 2022-02-17 DIAGNOSIS — U071 COVID-19: Secondary | ICD-10-CM | POA: Diagnosis not present

## 2022-02-17 DIAGNOSIS — I82401 Acute embolism and thrombosis of unspecified deep veins of right lower extremity: Secondary | ICD-10-CM | POA: Diagnosis not present

## 2022-02-20 DIAGNOSIS — S72001D Fracture of unspecified part of neck of right femur, subsequent encounter for closed fracture with routine healing: Secondary | ICD-10-CM | POA: Diagnosis not present

## 2022-02-20 DIAGNOSIS — G301 Alzheimer's disease with late onset: Secondary | ICD-10-CM | POA: Diagnosis not present

## 2022-02-20 DIAGNOSIS — F02B18 Dementia in other diseases classified elsewhere, moderate, with other behavioral disturbance: Secondary | ICD-10-CM | POA: Diagnosis not present

## 2022-02-20 DIAGNOSIS — F339 Major depressive disorder, recurrent, unspecified: Secondary | ICD-10-CM | POA: Diagnosis not present

## 2022-02-20 DIAGNOSIS — U071 COVID-19: Secondary | ICD-10-CM | POA: Diagnosis not present

## 2022-02-20 DIAGNOSIS — I82401 Acute embolism and thrombosis of unspecified deep veins of right lower extremity: Secondary | ICD-10-CM | POA: Diagnosis not present

## 2022-02-24 DIAGNOSIS — G301 Alzheimer's disease with late onset: Secondary | ICD-10-CM | POA: Diagnosis not present

## 2022-02-24 DIAGNOSIS — F02B18 Dementia in other diseases classified elsewhere, moderate, with other behavioral disturbance: Secondary | ICD-10-CM | POA: Diagnosis not present

## 2022-02-24 DIAGNOSIS — S72001D Fracture of unspecified part of neck of right femur, subsequent encounter for closed fracture with routine healing: Secondary | ICD-10-CM | POA: Diagnosis not present

## 2022-02-24 DIAGNOSIS — F339 Major depressive disorder, recurrent, unspecified: Secondary | ICD-10-CM | POA: Diagnosis not present

## 2022-02-24 DIAGNOSIS — U071 COVID-19: Secondary | ICD-10-CM | POA: Diagnosis not present

## 2022-02-24 DIAGNOSIS — I82401 Acute embolism and thrombosis of unspecified deep veins of right lower extremity: Secondary | ICD-10-CM | POA: Diagnosis not present

## 2022-02-27 DIAGNOSIS — U071 COVID-19: Secondary | ICD-10-CM | POA: Diagnosis not present

## 2022-02-27 DIAGNOSIS — F339 Major depressive disorder, recurrent, unspecified: Secondary | ICD-10-CM | POA: Diagnosis not present

## 2022-02-27 DIAGNOSIS — G301 Alzheimer's disease with late onset: Secondary | ICD-10-CM | POA: Diagnosis not present

## 2022-02-27 DIAGNOSIS — F02B18 Dementia in other diseases classified elsewhere, moderate, with other behavioral disturbance: Secondary | ICD-10-CM | POA: Diagnosis not present

## 2022-02-27 DIAGNOSIS — S72001D Fracture of unspecified part of neck of right femur, subsequent encounter for closed fracture with routine healing: Secondary | ICD-10-CM | POA: Diagnosis not present

## 2022-02-27 DIAGNOSIS — I82401 Acute embolism and thrombosis of unspecified deep veins of right lower extremity: Secondary | ICD-10-CM | POA: Diagnosis not present

## 2022-03-02 DIAGNOSIS — Z8673 Personal history of transient ischemic attack (TIA), and cerebral infarction without residual deficits: Secondary | ICD-10-CM | POA: Diagnosis not present

## 2022-03-02 DIAGNOSIS — F339 Major depressive disorder, recurrent, unspecified: Secondary | ICD-10-CM | POA: Diagnosis not present

## 2022-03-02 DIAGNOSIS — S72001D Fracture of unspecified part of neck of right femur, subsequent encounter for closed fracture with routine healing: Secondary | ICD-10-CM | POA: Diagnosis not present

## 2022-03-02 DIAGNOSIS — I82401 Acute embolism and thrombosis of unspecified deep veins of right lower extremity: Secondary | ICD-10-CM | POA: Diagnosis not present

## 2022-03-02 DIAGNOSIS — K219 Gastro-esophageal reflux disease without esophagitis: Secondary | ICD-10-CM | POA: Diagnosis not present

## 2022-03-02 DIAGNOSIS — W19XXXD Unspecified fall, subsequent encounter: Secondary | ICD-10-CM | POA: Diagnosis not present

## 2022-03-02 DIAGNOSIS — U071 COVID-19: Secondary | ICD-10-CM | POA: Diagnosis not present

## 2022-03-02 DIAGNOSIS — G301 Alzheimer's disease with late onset: Secondary | ICD-10-CM | POA: Diagnosis not present

## 2022-03-02 DIAGNOSIS — F02B18 Dementia in other diseases classified elsewhere, moderate, with other behavioral disturbance: Secondary | ICD-10-CM | POA: Diagnosis not present

## 2022-03-02 DIAGNOSIS — Z9181 History of falling: Secondary | ICD-10-CM | POA: Diagnosis not present

## 2022-03-02 DIAGNOSIS — E782 Mixed hyperlipidemia: Secondary | ICD-10-CM | POA: Diagnosis not present

## 2022-03-02 DIAGNOSIS — H9193 Unspecified hearing loss, bilateral: Secondary | ICD-10-CM | POA: Diagnosis not present

## 2022-03-03 ENCOUNTER — Telehealth (INDEPENDENT_AMBULATORY_CARE_PROVIDER_SITE_OTHER): Payer: Medicare Other | Admitting: Internal Medicine

## 2022-03-03 DIAGNOSIS — G301 Alzheimer's disease with late onset: Secondary | ICD-10-CM | POA: Diagnosis not present

## 2022-03-03 DIAGNOSIS — F02C0 Dementia in other diseases classified elsewhere, severe, without behavioral disturbance, psychotic disturbance, mood disturbance, and anxiety: Secondary | ICD-10-CM

## 2022-03-03 DIAGNOSIS — Z66 Do not resuscitate: Secondary | ICD-10-CM | POA: Diagnosis not present

## 2022-03-03 DIAGNOSIS — Z8673 Personal history of transient ischemic attack (TIA), and cerebral infarction without residual deficits: Secondary | ICD-10-CM | POA: Diagnosis not present

## 2022-03-03 DIAGNOSIS — F3342 Major depressive disorder, recurrent, in full remission: Secondary | ICD-10-CM

## 2022-03-03 NOTE — Progress Notes (Signed)
Virtual Visit via Video Note ? ?I connected with [name] Barker on 03/03/22 at  3:40 PM EST by a video enabled telemedicine application and verified that I am speaking with the correct person using two identifiers. ? ?The patient and the provider were at separate locations throughout the entire encounter. Patient location: home, Provider location: work ?  ?I discussed the limitations of evaluation and management by telemedicine and the availability of in person appointments. The patient expressed understanding and agreed to proceed. The patient and the provider were the only parties present for the visit unless noted in HPI below. ? ?History of Present Illness: The patient is a 88 y.o. female with daughter with visit for brief loss of consciousness and check in on PT. She is improving in strength with PT and more stable with walking. Needs to continue to maintain. ? ?Observations/Objective: Appearance: normal, breathing appears normal, casual grooming, memory oriented to person ? ?Assessment and Plan: See problem oriented charting ? ?Follow Up Instructions: no further evaluation of LOC given goals of care, refill medications and continue to sign off on home health as this is still needed.  ? ?I discussed the assessment and treatment plan with the patient. The patient was provided an opportunity to ask questions and all were answered. The patient agreed with the plan and demonstrated an understanding of the instructions. ?  ?The patient was advised to call back or seek an in-person evaluation if the symptoms worsen or if the condition fails to improve as anticipated. ? ?Hoyt Koch, MD ? ?

## 2022-03-04 DIAGNOSIS — S72001D Fracture of unspecified part of neck of right femur, subsequent encounter for closed fracture with routine healing: Secondary | ICD-10-CM | POA: Diagnosis not present

## 2022-03-04 DIAGNOSIS — F02B18 Dementia in other diseases classified elsewhere, moderate, with other behavioral disturbance: Secondary | ICD-10-CM | POA: Diagnosis not present

## 2022-03-04 DIAGNOSIS — U071 COVID-19: Secondary | ICD-10-CM | POA: Diagnosis not present

## 2022-03-04 DIAGNOSIS — F339 Major depressive disorder, recurrent, unspecified: Secondary | ICD-10-CM | POA: Diagnosis not present

## 2022-03-04 DIAGNOSIS — I82401 Acute embolism and thrombosis of unspecified deep veins of right lower extremity: Secondary | ICD-10-CM | POA: Diagnosis not present

## 2022-03-04 DIAGNOSIS — G301 Alzheimer's disease with late onset: Secondary | ICD-10-CM | POA: Diagnosis not present

## 2022-03-05 ENCOUNTER — Encounter: Payer: Self-pay | Admitting: Internal Medicine

## 2022-03-05 NOTE — Assessment & Plan Note (Signed)
Reaffirmed desire for comfort care and reasonable care. She is feeling well overall and will maintain medications which help with quality of life only.  ?

## 2022-03-05 NOTE — Assessment & Plan Note (Signed)
Overall progressive but no behavioral issues. No recent falls. Could be that the brief lapse in consciousness is related to the dementia progressing and/or seizure activity. They do not wish to pursue as this happens 1-2 times per year. Previous 30 day monitor without explanation. ?

## 2022-03-05 NOTE — Assessment & Plan Note (Signed)
Needs to continue with PT at home as this has lowered her fall risk and helped with mobility.  ?

## 2022-03-05 NOTE — Assessment & Plan Note (Signed)
Refill celexa when due as this helps with mood and no side effects.  ?

## 2022-03-10 DIAGNOSIS — G301 Alzheimer's disease with late onset: Secondary | ICD-10-CM | POA: Diagnosis not present

## 2022-03-10 DIAGNOSIS — U071 COVID-19: Secondary | ICD-10-CM | POA: Diagnosis not present

## 2022-03-10 DIAGNOSIS — I82401 Acute embolism and thrombosis of unspecified deep veins of right lower extremity: Secondary | ICD-10-CM | POA: Diagnosis not present

## 2022-03-10 DIAGNOSIS — F339 Major depressive disorder, recurrent, unspecified: Secondary | ICD-10-CM | POA: Diagnosis not present

## 2022-03-10 DIAGNOSIS — S72001D Fracture of unspecified part of neck of right femur, subsequent encounter for closed fracture with routine healing: Secondary | ICD-10-CM | POA: Diagnosis not present

## 2022-03-10 DIAGNOSIS — F02B18 Dementia in other diseases classified elsewhere, moderate, with other behavioral disturbance: Secondary | ICD-10-CM | POA: Diagnosis not present

## 2022-03-13 DIAGNOSIS — F339 Major depressive disorder, recurrent, unspecified: Secondary | ICD-10-CM | POA: Diagnosis not present

## 2022-03-13 DIAGNOSIS — G301 Alzheimer's disease with late onset: Secondary | ICD-10-CM | POA: Diagnosis not present

## 2022-03-13 DIAGNOSIS — U071 COVID-19: Secondary | ICD-10-CM | POA: Diagnosis not present

## 2022-03-13 DIAGNOSIS — F02B18 Dementia in other diseases classified elsewhere, moderate, with other behavioral disturbance: Secondary | ICD-10-CM | POA: Diagnosis not present

## 2022-03-13 DIAGNOSIS — S72001D Fracture of unspecified part of neck of right femur, subsequent encounter for closed fracture with routine healing: Secondary | ICD-10-CM | POA: Diagnosis not present

## 2022-03-13 DIAGNOSIS — I82401 Acute embolism and thrombosis of unspecified deep veins of right lower extremity: Secondary | ICD-10-CM | POA: Diagnosis not present

## 2022-03-18 DIAGNOSIS — S72001D Fracture of unspecified part of neck of right femur, subsequent encounter for closed fracture with routine healing: Secondary | ICD-10-CM | POA: Diagnosis not present

## 2022-03-18 DIAGNOSIS — F02B18 Dementia in other diseases classified elsewhere, moderate, with other behavioral disturbance: Secondary | ICD-10-CM | POA: Diagnosis not present

## 2022-03-18 DIAGNOSIS — F339 Major depressive disorder, recurrent, unspecified: Secondary | ICD-10-CM | POA: Diagnosis not present

## 2022-03-18 DIAGNOSIS — U071 COVID-19: Secondary | ICD-10-CM | POA: Diagnosis not present

## 2022-03-18 DIAGNOSIS — I82401 Acute embolism and thrombosis of unspecified deep veins of right lower extremity: Secondary | ICD-10-CM | POA: Diagnosis not present

## 2022-03-18 DIAGNOSIS — G301 Alzheimer's disease with late onset: Secondary | ICD-10-CM | POA: Diagnosis not present

## 2022-03-25 ENCOUNTER — Telehealth (INDEPENDENT_AMBULATORY_CARE_PROVIDER_SITE_OTHER): Payer: Medicare Other | Admitting: Internal Medicine

## 2022-03-25 ENCOUNTER — Encounter: Payer: Self-pay | Admitting: Internal Medicine

## 2022-03-25 DIAGNOSIS — F02C0 Dementia in other diseases classified elsewhere, severe, without behavioral disturbance, psychotic disturbance, mood disturbance, and anxiety: Secondary | ICD-10-CM | POA: Diagnosis not present

## 2022-03-25 DIAGNOSIS — G301 Alzheimer's disease with late onset: Secondary | ICD-10-CM | POA: Diagnosis not present

## 2022-03-25 MED ORDER — AMOXICILLIN-POT CLAVULANATE 875-125 MG PO TABS: 1.0000 | ORAL_TABLET | Freq: Two times a day (BID) | ORAL | 0 refills | Status: DC

## 2022-03-25 MED ORDER — LORAZEPAM 0.5 MG PO TABS: 0.5000 mg | ORAL_TABLET | Freq: Two times a day (BID) | ORAL | 2 refills | Status: DC | PRN

## 2022-03-25 NOTE — Progress Notes (Signed)
Virtual Visit via Video Note ? ?I connected with Cam Harnden Barot on 03/25/22 at  4:00 PM EDT by a video enabled telemedicine application and verified that I am speaking with the correct person using two identifiers. ? ?The patient and the provider were at separate locations throughout the entire encounter. Patient location: home, Provider location: work ?  ?I discussed the limitations of evaluation and management by telemedicine and the availability of in person appointments. The patient expressed understanding and agreed to proceed. The patient and the provider were the only parties present for the visit unless noted in HPI below. ? ?History of Present Illness: The patient is a 88 y.o. female with visit for confusion and agitation. Daughter provides history. She is improving today after starting course of amoxicillin for likely UTI. The antibiotics they had on hand did not help.  ? ?Observations/Objective: Appearance: sleeping, breathing appears normal, memory poor ? ?Assessment and Plan: See problem oriented charting ? ?Follow Up Instructions: rx amoxicillin for likely UTI, rx ativan for confusion and agitation to use prn. ? ?I discussed the assessment and treatment plan with the patient. The patient was provided an opportunity to ask questions and all were answered. The patient agreed with the plan and demonstrated an understanding of the instructions. ?  ?The patient was advised to call back or seek an in-person evaluation if the symptoms worsen or if the condition fails to improve as anticipated. ? ?Hoyt Koch, MD ? ?

## 2022-03-28 ENCOUNTER — Telehealth: Payer: Self-pay | Admitting: Internal Medicine

## 2022-03-28 DIAGNOSIS — G301 Alzheimer's disease with late onset: Secondary | ICD-10-CM | POA: Diagnosis not present

## 2022-03-28 DIAGNOSIS — S72001D Fracture of unspecified part of neck of right femur, subsequent encounter for closed fracture with routine healing: Secondary | ICD-10-CM | POA: Diagnosis not present

## 2022-03-28 DIAGNOSIS — I82401 Acute embolism and thrombosis of unspecified deep veins of right lower extremity: Secondary | ICD-10-CM | POA: Diagnosis not present

## 2022-03-28 DIAGNOSIS — U071 COVID-19: Secondary | ICD-10-CM | POA: Diagnosis not present

## 2022-03-28 DIAGNOSIS — F02B18 Dementia in other diseases classified elsewhere, moderate, with other behavioral disturbance: Secondary | ICD-10-CM | POA: Diagnosis not present

## 2022-03-28 DIAGNOSIS — F339 Major depressive disorder, recurrent, unspecified: Secondary | ICD-10-CM | POA: Diagnosis not present

## 2022-03-28 NOTE — Assessment & Plan Note (Signed)
Rx ativan to use prn agitation. Rx amoxicillin/clav to take for uti or sinus infection prn. Her daughter is able to tell and initiate early to prevent reversible decline. ?

## 2022-03-28 NOTE — Telephone Encounter (Signed)
Carly from [facility] is requesting that you call her concerning this patient. ?

## 2022-03-31 ENCOUNTER — Ambulatory Visit (INDEPENDENT_AMBULATORY_CARE_PROVIDER_SITE_OTHER): Payer: Medicare Other

## 2022-03-31 DIAGNOSIS — K219 Gastro-esophageal reflux disease without esophagitis: Secondary | ICD-10-CM

## 2022-03-31 DIAGNOSIS — Z8673 Personal history of transient ischemic attack (TIA), and cerebral infarction without residual deficits: Secondary | ICD-10-CM

## 2022-03-31 DIAGNOSIS — E782 Mixed hyperlipidemia: Secondary | ICD-10-CM

## 2022-03-31 NOTE — Telephone Encounter (Signed)
Note not needed. Call transferred to [place].   ?

## 2022-03-31 NOTE — Progress Notes (Signed)
? ?Chronic Care Management ?Pharmacy Note ? ?03/31/2022 ?Name:  [name] Barker MRN:  [id number] DOB:  07-19-1921 ? ?Summary: ?-Spoke with patient's daughter [name] - confirms adherence to current medications denies any issues or concerns  ?-Has recently been prescribed augmentin that can be used for UTIs should patient show signs/ symptoms of UTI, has not had to use ? ?Recommendations/Changes made from today's visit: ?-Recommending no changes to current medications, to reach out with any questions or concerns about medications  ? ?Subjective: ?[name] Barker is an 88 y.o. year old female who is a primary patient of Hoyt Koch, MD.  The CCM team was consulted for assistance with disease management and care coordination needs.   ? ?Engaged with patient by telephone for follow up visit in response to provider referral for pharmacy case management and/or care coordination services.  ? ?Consent to Services:  ?The patient was given information about Chronic Care Management services, agreed to services, and gave verbal consent prior to initiation of services.  Please see initial visit note for detailed documentation.  ? ?Patient Care Team: ?Hoyt Koch, MD as PCP - General (Internal Medicine) ?Ladell Pier, MD as Consulting Physician (Oncology) ?Ladene Artist, MD as Consulting Physician (Gastroenterology) ?Hoyt Koch, MD (Internal Medicine) ?Tomasa Blase, [facility] (Pharmacist) ? ?Recent office visits: ?11/25/2021 - Dr. Sharlet Salina - no changes to medications  ? ?Recent consult visits: ?None in previous 6 months  ? ?Objective: ? ?Lab Results  ?Component Value Date  ? CREATININE 0.51 05/11/2021  ? BUN 9 05/11/2021  ? GFR 75.20 02/12/2018  ? GFRNONAA >60 05/11/2021  ? GFRAA >60 04/20/2019  ? NA 136 05/11/2021  ? K 3.7 05/11/2021  ? CALCIUM 8.4 (L) 05/11/2021  ? CO2 30 05/11/2021  ? ? ?Lab Results  ?Component Value Date/Time  ? HGBA1C 5.3 04/01/2019 03:12 AM  ? HGBA1C 5.8 (H) 04/04/2012  01:42 PM  ? GFR 75.20 02/12/2018 03:54 PM  ? GFR 119.61 05/19/2016 11:39 AM  ?  ?Last diabetic Eye exam: No results found for: HMDIABEYEEXA  ?Last diabetic Foot exam: No results found for: HMDIABFOOTEX  ? ?Lab Results  ?Component Value Date  ? CHOL 194 04/01/2019  ? HDL 42 04/01/2019  ? LDLCALC 137 (H) 04/01/2019  ? LDLDIRECT 133.6 09/26/2013  ? TRIG 75 04/01/2019  ? CHOLHDL 4.6 04/01/2019  ? ? ? ?  Latest Ref Rng & Units 01/28/2021  ?  8:39 AM 01/27/2021  ?  2:31 AM 04/20/2019  ?  4:07 PM  ?Hepatic Function  ?Total Protein 6.5 - 8.1 g/dL 6.1   6.7   6.4    ?Albumin 3.5 - 5.0 g/dL 2.9   3.3   3.4    ?AST 15 - 41 U/L $Remo'29   27   24    'VNRWN$ ?ALT 0 - 44 U/L $Remo'14   15   13    'fqzLr$ ?Alk Phosphatase 38 - 126 U/L 43   57   75    ?Total Bilirubin 0.3 - 1.2 mg/dL 0.4   0.2   0.6    ? ? ?Lab Results  ?Component Value Date/Time  ? TSH 3.50 02/12/2018 03:54 PM  ? TSH 3.00 03/19/2016 03:23 PM  ? FREET4 0.65 02/12/2018 03:54 PM  ? ? ? ?  Latest Ref Rng & Units 05/11/2021  ?  1:30 PM 01/30/2021  ?  7:22 AM 01/29/2021  ?  4:46 PM  ?CBC  ?WBC 4.0 - 10.5 K/uL 6.1   11.9     ?  Hemoglobin 12.0 - 15.0 g/dL 10.9   8.7   8.7    ?Hematocrit 36.0 - 46.0 % 34.7   27.4   26.2    ?Platelets 150 - 400 K/uL 241   237     ? ? ?Lab Results  ?Component Value Date/Time  ? VD25OH 50.12 02/12/2018 03:54 PM  ? VD25OH 23.94 (L) 11/02/2015 02:44 PM  ? ? ?Clinical ASCVD: Yes  ?The ASCVD Risk score (Arnett DK, et al., 2019) failed to calculate for the following reasons: ?  The 2019 ASCVD risk score is only valid for ages 1 to 82   ? ? ?  11/25/2021  ?  3:12 PM 07/27/2020  ?  7:26 AM 08/04/2017  ?  2:39 PM  ?Depression screen PHQ 2/9  ?Decreased Interest 0 0 0  ?Down, Depressed, Hopeless 0 0 1  ?PHQ - 2 Score 0 0 1  ?Altered sleeping 0  0  ?Tired, decreased energy 0  1  ?Change in appetite 0  0  ?Feeling bad or failure about yourself  0  0  ?Trouble concentrating 0  0  ?Moving slowly or fidgety/restless 0  0  ?Suicidal thoughts 0  0  ?PHQ-9 Score 0  2  ?Difficult doing work/chores    Not difficult at all  ?  ? ?Social History  ? ?Tobacco Use  ?Smoking Status Never  ?Smokeless Tobacco Never  ? ?BP Readings from Last 3 Encounters:  ?11/25/21 124/70  ?05/11/21 (!) 186/88  ?01/30/21 (!) 152/49  ? ?Pulse Readings from Last 3 Encounters:  ?11/25/21 65  ?05/11/21 63  ?01/30/21 72  ? ?Wt Readings from Last 3 Encounters:  ?11/25/21 123 lb 9.6 oz (56.1 kg)  ?05/11/21 139 lb 15.9 oz (63.5 kg)  ?01/26/21 140 lb (63.5 kg)  ? ? ?Assessment/Interventions: Review of patient past medical history, allergies, medications, health status, including review of consultants reports, laboratory and other test data, was performed as part of comprehensive evaluation and provision of chronic care management services.  ? ?SDOH:  (Social Determinants of Health) assessments and interventions performed: Yes ? ? ? ?CCM Care Plan ? ?Allergies  ?Allergen Reactions  ? Morphine And Related Other (See Comments)  ?  Blisters at site and along vein  ? Flexeril [Cyclobenzaprine] Other (See Comments)  ?  Nightmares ?  ? Methenamine Mandelate Other (See Comments)  ?  Burning sensation  ? Penicillins Hives  ?  Did it involve swelling of the face/tongue/throat, SOB, or low BP? Yes ?Did it involve sudden or severe rash/hives, skin peeling, or any reaction on the inside of your mouth or nose? Unk ?Did you need to seek medical attention at a hospital or doctor's office? Unk ?When did it last happen? Childhood ?If all above answers are ?NO?, may proceed with cephalosporin use. ?  ? Promethazine Hcl Other (See Comments)  ?  Hallucinations  ? Statins Other (See Comments)  ?  Muscle pain   ? Ativan [Lorazepam] Anxiety  ? Morphine Sulfate Rash and Other (See Comments)  ?  Blisters all over body  ? ? ?Medications Reviewed Today   ? ? Reviewed by Tomasa Blase, [facility] (Pharmacist) on 03/31/22 at 1009  Med List Status: <None>  ? ?Medication Order Taking? Sig Documenting Provider Last Dose Status Informant  ?acetaminophen (TYLENOL) 500 MG tablet  [id number] Yes Take 500 mg by mouth 2 (two) times a day. FOR BACK PAIN [provider] Taking Active Family Member  ?albuterol (VENTOLIN HFA) 108 (90 Base) MCG/ACT  inhaler [id number] Yes INHALE 2 PUFFS BY MOUTH EVERY 6 HOURS AS NEEDED FOR WHEEZING OR FOR SHORTNESS OF Hoover Brunette, MD Taking Active   ?amoxicillin-clavulanate (AUGMENTIN) 875-125 MG tablet [id number] Yes Take 1 tablet by mouth 2 (two) times daily. Hoyt Koch, MD Taking Active   ?citalopram (CELEXA) 10 MG tablet [id number] Yes Take 1 tablet (10 mg total) by mouth daily. Hoyt Koch, MD Taking Active   ?Cranberry 500 MG TABS [id number] Yes Take 500 mg by mouth in the morning and at bedtime. [provider] Taking Active Family Member  ?diclofenac Sodium (VOLTAREN) 1 % GEL [id number] No Apply 4 g topically 4 (four) times daily.  ?Patient not taking: Reported on 03/31/2022  ? Hoyt Koch, MD Not Taking Active   ?famotidine (PEPCID) 20 MG tablet [id number] Yes Take 1 tablet (20 mg total) by mouth at bedtime. Hoyt Koch, MD Taking Active   ?fexofenadine [facility]) 180 MG tablet [id number] Yes Take 180 mg by mouth in the morning. [provider] Taking Active Family Member  ?fluticasone (FLONASE) 50 MCG/ACT nasal spray [id number] Yes Place 1 spray into both nostrils in the morning and at bedtime. [provider] Taking Active Family Member  ?LORazepam (ATIVAN) 0.5 MG tablet [id number] Yes Take 1 tablet (0.5 mg total) by mouth 2 (two) times daily as needed for anxiety. Hoyt Koch, MD Taking Active   ?Multiple Vitamin (MULTIVITAMIN) tablet [id number] Yes Take 1 tablet by mouth daily. [provider] Taking Active Family Member  ?Multiple Vitamins-Minerals (PRESERVISION AREDS 2 PO) [id number] Yes Take 1 tablet by mouth in the morning and at bedtime. [provider] Taking Active Family Member  ? Patient not taking:   Discontinued 02/05/21 1418 (Patient  Preference)   ? ?  ?  ? ?  ? ? ?Patient Active Problem List  ? Diagnosis Date Noted  ? DNR (do not resuscitate)   ? Hx of completed stroke 03/31/2019  ? Vasomotor rhinitis 11/17/2018  ? Irregular heartbeat 0

## 2022-03-31 NOTE — Telephone Encounter (Signed)
Waiting on paperwork from [place] with [facility]. Fax number was provided.  ?

## 2022-03-31 NOTE — Patient Instructions (Signed)
Visit Information ? ?Following are the goals we discussed today:  ? ?Manage My Medicine  ? ?Timeframe:  Long-Range Goal ?Priority:  High ?Start Date:      02/05/21                       ?Expected End Date:    04/01/2023             ? ?Follow Up Date: 09/2022 ?  ?- call for medicine refill 2 or 3 days before it runs out ?- call if I am sick and can't take my medicine ?- keep a list of all the medicines I take; vitamins and herbals too ?- use a pillbox to sort medicine  ?  ?Why is this important?   ?These steps will help you keep on track with your medicines. ? ?Plan: Telephone follow up appointment with care management team member scheduled for:  6-8 months  ?The patient has been provided with contact information for the care management team and has been advised to call with any health related questions or concerns.  ? ?Tomasa Blase, PharmD ?Clinical Pharmacist, [place]  ? ?Please call the care guide team at [phone] if you need to cancel or reschedule your appointment.  ? ?Patient verbalizes understanding of instructions and care plan provided today and agrees to view in [place]. Active MyChart status confirmed with patient.   ? ?

## 2022-04-01 DIAGNOSIS — S72001D Fracture of unspecified part of neck of right femur, subsequent encounter for closed fracture with routine healing: Secondary | ICD-10-CM | POA: Diagnosis not present

## 2022-04-01 DIAGNOSIS — K219 Gastro-esophageal reflux disease without esophagitis: Secondary | ICD-10-CM | POA: Diagnosis not present

## 2022-04-01 DIAGNOSIS — H9193 Unspecified hearing loss, bilateral: Secondary | ICD-10-CM | POA: Diagnosis not present

## 2022-04-01 DIAGNOSIS — Z8673 Personal history of transient ischemic attack (TIA), and cerebral infarction without residual deficits: Secondary | ICD-10-CM | POA: Diagnosis not present

## 2022-04-01 DIAGNOSIS — Z9181 History of falling: Secondary | ICD-10-CM | POA: Diagnosis not present

## 2022-04-01 DIAGNOSIS — F02B18 Dementia in other diseases classified elsewhere, moderate, with other behavioral disturbance: Secondary | ICD-10-CM | POA: Diagnosis not present

## 2022-04-01 DIAGNOSIS — E782 Mixed hyperlipidemia: Secondary | ICD-10-CM | POA: Diagnosis not present

## 2022-04-01 DIAGNOSIS — F339 Major depressive disorder, recurrent, unspecified: Secondary | ICD-10-CM | POA: Diagnosis not present

## 2022-04-01 DIAGNOSIS — G301 Alzheimer's disease with late onset: Secondary | ICD-10-CM | POA: Diagnosis not present

## 2022-04-01 DIAGNOSIS — I82401 Acute embolism and thrombosis of unspecified deep veins of right lower extremity: Secondary | ICD-10-CM | POA: Diagnosis not present

## 2022-04-01 DIAGNOSIS — N390 Urinary tract infection, site not specified: Secondary | ICD-10-CM | POA: Diagnosis not present

## 2022-04-03 DIAGNOSIS — F02B18 Dementia in other diseases classified elsewhere, moderate, with other behavioral disturbance: Secondary | ICD-10-CM | POA: Diagnosis not present

## 2022-04-03 DIAGNOSIS — N390 Urinary tract infection, site not specified: Secondary | ICD-10-CM | POA: Diagnosis not present

## 2022-04-03 DIAGNOSIS — F339 Major depressive disorder, recurrent, unspecified: Secondary | ICD-10-CM | POA: Diagnosis not present

## 2022-04-03 DIAGNOSIS — G301 Alzheimer's disease with late onset: Secondary | ICD-10-CM | POA: Diagnosis not present

## 2022-04-03 DIAGNOSIS — I82401 Acute embolism and thrombosis of unspecified deep veins of right lower extremity: Secondary | ICD-10-CM | POA: Diagnosis not present

## 2022-04-03 DIAGNOSIS — S72001D Fracture of unspecified part of neck of right femur, subsequent encounter for closed fracture with routine healing: Secondary | ICD-10-CM | POA: Diagnosis not present

## 2022-04-09 DIAGNOSIS — N390 Urinary tract infection, site not specified: Secondary | ICD-10-CM | POA: Diagnosis not present

## 2022-04-09 DIAGNOSIS — I82401 Acute embolism and thrombosis of unspecified deep veins of right lower extremity: Secondary | ICD-10-CM | POA: Diagnosis not present

## 2022-04-09 DIAGNOSIS — F02B18 Dementia in other diseases classified elsewhere, moderate, with other behavioral disturbance: Secondary | ICD-10-CM | POA: Diagnosis not present

## 2022-04-09 DIAGNOSIS — G301 Alzheimer's disease with late onset: Secondary | ICD-10-CM | POA: Diagnosis not present

## 2022-04-09 DIAGNOSIS — F339 Major depressive disorder, recurrent, unspecified: Secondary | ICD-10-CM | POA: Diagnosis not present

## 2022-04-09 DIAGNOSIS — S72001D Fracture of unspecified part of neck of right femur, subsequent encounter for closed fracture with routine healing: Secondary | ICD-10-CM | POA: Diagnosis not present

## 2022-04-11 DIAGNOSIS — N390 Urinary tract infection, site not specified: Secondary | ICD-10-CM | POA: Diagnosis not present

## 2022-04-11 DIAGNOSIS — F02B18 Dementia in other diseases classified elsewhere, moderate, with other behavioral disturbance: Secondary | ICD-10-CM | POA: Diagnosis not present

## 2022-04-11 DIAGNOSIS — F339 Major depressive disorder, recurrent, unspecified: Secondary | ICD-10-CM | POA: Diagnosis not present

## 2022-04-11 DIAGNOSIS — S72001D Fracture of unspecified part of neck of right femur, subsequent encounter for closed fracture with routine healing: Secondary | ICD-10-CM | POA: Diagnosis not present

## 2022-04-11 DIAGNOSIS — G301 Alzheimer's disease with late onset: Secondary | ICD-10-CM | POA: Diagnosis not present

## 2022-04-11 DIAGNOSIS — I82401 Acute embolism and thrombosis of unspecified deep veins of right lower extremity: Secondary | ICD-10-CM | POA: Diagnosis not present

## 2022-04-15 DIAGNOSIS — I82401 Acute embolism and thrombosis of unspecified deep veins of right lower extremity: Secondary | ICD-10-CM | POA: Diagnosis not present

## 2022-04-15 DIAGNOSIS — S72001D Fracture of unspecified part of neck of right femur, subsequent encounter for closed fracture with routine healing: Secondary | ICD-10-CM | POA: Diagnosis not present

## 2022-04-15 DIAGNOSIS — F339 Major depressive disorder, recurrent, unspecified: Secondary | ICD-10-CM | POA: Diagnosis not present

## 2022-04-15 DIAGNOSIS — N390 Urinary tract infection, site not specified: Secondary | ICD-10-CM | POA: Diagnosis not present

## 2022-04-15 DIAGNOSIS — F02B18 Dementia in other diseases classified elsewhere, moderate, with other behavioral disturbance: Secondary | ICD-10-CM | POA: Diagnosis not present

## 2022-04-15 DIAGNOSIS — Z20822 Contact with and (suspected) exposure to covid-19: Secondary | ICD-10-CM | POA: Diagnosis not present

## 2022-04-15 DIAGNOSIS — G301 Alzheimer's disease with late onset: Secondary | ICD-10-CM | POA: Diagnosis not present

## 2022-04-18 DIAGNOSIS — S72001D Fracture of unspecified part of neck of right femur, subsequent encounter for closed fracture with routine healing: Secondary | ICD-10-CM | POA: Diagnosis not present

## 2022-04-18 DIAGNOSIS — N390 Urinary tract infection, site not specified: Secondary | ICD-10-CM | POA: Diagnosis not present

## 2022-04-18 DIAGNOSIS — F339 Major depressive disorder, recurrent, unspecified: Secondary | ICD-10-CM | POA: Diagnosis not present

## 2022-04-18 DIAGNOSIS — F02B18 Dementia in other diseases classified elsewhere, moderate, with other behavioral disturbance: Secondary | ICD-10-CM | POA: Diagnosis not present

## 2022-04-18 DIAGNOSIS — I82401 Acute embolism and thrombosis of unspecified deep veins of right lower extremity: Secondary | ICD-10-CM | POA: Diagnosis not present

## 2022-04-18 DIAGNOSIS — G301 Alzheimer's disease with late onset: Secondary | ICD-10-CM | POA: Diagnosis not present

## 2022-04-23 DIAGNOSIS — F339 Major depressive disorder, recurrent, unspecified: Secondary | ICD-10-CM | POA: Diagnosis not present

## 2022-04-23 DIAGNOSIS — I82401 Acute embolism and thrombosis of unspecified deep veins of right lower extremity: Secondary | ICD-10-CM | POA: Diagnosis not present

## 2022-04-23 DIAGNOSIS — G301 Alzheimer's disease with late onset: Secondary | ICD-10-CM | POA: Diagnosis not present

## 2022-04-23 DIAGNOSIS — F02B18 Dementia in other diseases classified elsewhere, moderate, with other behavioral disturbance: Secondary | ICD-10-CM | POA: Diagnosis not present

## 2022-04-23 DIAGNOSIS — S72001D Fracture of unspecified part of neck of right femur, subsequent encounter for closed fracture with routine healing: Secondary | ICD-10-CM | POA: Diagnosis not present

## 2022-04-23 DIAGNOSIS — N390 Urinary tract infection, site not specified: Secondary | ICD-10-CM | POA: Diagnosis not present

## 2022-04-25 DIAGNOSIS — G301 Alzheimer's disease with late onset: Secondary | ICD-10-CM | POA: Diagnosis not present

## 2022-04-25 DIAGNOSIS — S72001D Fracture of unspecified part of neck of right femur, subsequent encounter for closed fracture with routine healing: Secondary | ICD-10-CM | POA: Diagnosis not present

## 2022-04-25 DIAGNOSIS — N390 Urinary tract infection, site not specified: Secondary | ICD-10-CM | POA: Diagnosis not present

## 2022-04-25 DIAGNOSIS — F339 Major depressive disorder, recurrent, unspecified: Secondary | ICD-10-CM | POA: Diagnosis not present

## 2022-04-25 DIAGNOSIS — F02B18 Dementia in other diseases classified elsewhere, moderate, with other behavioral disturbance: Secondary | ICD-10-CM | POA: Diagnosis not present

## 2022-04-25 DIAGNOSIS — I82401 Acute embolism and thrombosis of unspecified deep veins of right lower extremity: Secondary | ICD-10-CM | POA: Diagnosis not present

## 2022-04-27 DIAGNOSIS — E782 Mixed hyperlipidemia: Secondary | ICD-10-CM

## 2022-05-01 DIAGNOSIS — K219 Gastro-esophageal reflux disease without esophagitis: Secondary | ICD-10-CM | POA: Diagnosis not present

## 2022-05-01 DIAGNOSIS — F339 Major depressive disorder, recurrent, unspecified: Secondary | ICD-10-CM | POA: Diagnosis not present

## 2022-05-01 DIAGNOSIS — N390 Urinary tract infection, site not specified: Secondary | ICD-10-CM | POA: Diagnosis not present

## 2022-05-01 DIAGNOSIS — Z9181 History of falling: Secondary | ICD-10-CM | POA: Diagnosis not present

## 2022-05-01 DIAGNOSIS — F02B18 Dementia in other diseases classified elsewhere, moderate, with other behavioral disturbance: Secondary | ICD-10-CM | POA: Diagnosis not present

## 2022-05-01 DIAGNOSIS — S72001D Fracture of unspecified part of neck of right femur, subsequent encounter for closed fracture with routine healing: Secondary | ICD-10-CM | POA: Diagnosis not present

## 2022-05-01 DIAGNOSIS — G301 Alzheimer's disease with late onset: Secondary | ICD-10-CM | POA: Diagnosis not present

## 2022-05-01 DIAGNOSIS — H9193 Unspecified hearing loss, bilateral: Secondary | ICD-10-CM | POA: Diagnosis not present

## 2022-05-01 DIAGNOSIS — I82401 Acute embolism and thrombosis of unspecified deep veins of right lower extremity: Secondary | ICD-10-CM | POA: Diagnosis not present

## 2022-05-01 DIAGNOSIS — E782 Mixed hyperlipidemia: Secondary | ICD-10-CM | POA: Diagnosis not present

## 2022-05-01 DIAGNOSIS — Z8673 Personal history of transient ischemic attack (TIA), and cerebral infarction without residual deficits: Secondary | ICD-10-CM | POA: Diagnosis not present

## 2022-05-03 DIAGNOSIS — I82401 Acute embolism and thrombosis of unspecified deep veins of right lower extremity: Secondary | ICD-10-CM | POA: Diagnosis not present

## 2022-05-03 DIAGNOSIS — F02B18 Dementia in other diseases classified elsewhere, moderate, with other behavioral disturbance: Secondary | ICD-10-CM | POA: Diagnosis not present

## 2022-05-03 DIAGNOSIS — G301 Alzheimer's disease with late onset: Secondary | ICD-10-CM | POA: Diagnosis not present

## 2022-05-03 DIAGNOSIS — F339 Major depressive disorder, recurrent, unspecified: Secondary | ICD-10-CM | POA: Diagnosis not present

## 2022-05-03 DIAGNOSIS — N390 Urinary tract infection, site not specified: Secondary | ICD-10-CM | POA: Diagnosis not present

## 2022-05-03 DIAGNOSIS — S72001D Fracture of unspecified part of neck of right femur, subsequent encounter for closed fracture with routine healing: Secondary | ICD-10-CM | POA: Diagnosis not present

## 2022-05-05 DIAGNOSIS — F02B18 Dementia in other diseases classified elsewhere, moderate, with other behavioral disturbance: Secondary | ICD-10-CM | POA: Diagnosis not present

## 2022-05-05 DIAGNOSIS — G301 Alzheimer's disease with late onset: Secondary | ICD-10-CM | POA: Diagnosis not present

## 2022-05-05 DIAGNOSIS — F339 Major depressive disorder, recurrent, unspecified: Secondary | ICD-10-CM | POA: Diagnosis not present

## 2022-05-05 DIAGNOSIS — N390 Urinary tract infection, site not specified: Secondary | ICD-10-CM | POA: Diagnosis not present

## 2022-05-05 DIAGNOSIS — S72001D Fracture of unspecified part of neck of right femur, subsequent encounter for closed fracture with routine healing: Secondary | ICD-10-CM | POA: Diagnosis not present

## 2022-05-05 DIAGNOSIS — I82401 Acute embolism and thrombosis of unspecified deep veins of right lower extremity: Secondary | ICD-10-CM | POA: Diagnosis not present

## 2022-05-08 DIAGNOSIS — N390 Urinary tract infection, site not specified: Secondary | ICD-10-CM | POA: Diagnosis not present

## 2022-05-08 DIAGNOSIS — S72001D Fracture of unspecified part of neck of right femur, subsequent encounter for closed fracture with routine healing: Secondary | ICD-10-CM | POA: Diagnosis not present

## 2022-05-08 DIAGNOSIS — I82401 Acute embolism and thrombosis of unspecified deep veins of right lower extremity: Secondary | ICD-10-CM | POA: Diagnosis not present

## 2022-05-08 DIAGNOSIS — G301 Alzheimer's disease with late onset: Secondary | ICD-10-CM | POA: Diagnosis not present

## 2022-05-08 DIAGNOSIS — F02B18 Dementia in other diseases classified elsewhere, moderate, with other behavioral disturbance: Secondary | ICD-10-CM | POA: Diagnosis not present

## 2022-05-08 DIAGNOSIS — F339 Major depressive disorder, recurrent, unspecified: Secondary | ICD-10-CM | POA: Diagnosis not present

## 2022-05-12 DIAGNOSIS — F339 Major depressive disorder, recurrent, unspecified: Secondary | ICD-10-CM | POA: Diagnosis not present

## 2022-05-12 DIAGNOSIS — G301 Alzheimer's disease with late onset: Secondary | ICD-10-CM | POA: Diagnosis not present

## 2022-05-12 DIAGNOSIS — I82401 Acute embolism and thrombosis of unspecified deep veins of right lower extremity: Secondary | ICD-10-CM | POA: Diagnosis not present

## 2022-05-12 DIAGNOSIS — S72001D Fracture of unspecified part of neck of right femur, subsequent encounter for closed fracture with routine healing: Secondary | ICD-10-CM | POA: Diagnosis not present

## 2022-05-12 DIAGNOSIS — F02B18 Dementia in other diseases classified elsewhere, moderate, with other behavioral disturbance: Secondary | ICD-10-CM | POA: Diagnosis not present

## 2022-05-12 DIAGNOSIS — N390 Urinary tract infection, site not specified: Secondary | ICD-10-CM | POA: Diagnosis not present

## 2022-05-17 DIAGNOSIS — F339 Major depressive disorder, recurrent, unspecified: Secondary | ICD-10-CM | POA: Diagnosis not present

## 2022-05-17 DIAGNOSIS — N390 Urinary tract infection, site not specified: Secondary | ICD-10-CM | POA: Diagnosis not present

## 2022-05-17 DIAGNOSIS — G301 Alzheimer's disease with late onset: Secondary | ICD-10-CM | POA: Diagnosis not present

## 2022-05-17 DIAGNOSIS — I82401 Acute embolism and thrombosis of unspecified deep veins of right lower extremity: Secondary | ICD-10-CM | POA: Diagnosis not present

## 2022-05-17 DIAGNOSIS — F02B18 Dementia in other diseases classified elsewhere, moderate, with other behavioral disturbance: Secondary | ICD-10-CM | POA: Diagnosis not present

## 2022-05-17 DIAGNOSIS — S72001D Fracture of unspecified part of neck of right femur, subsequent encounter for closed fracture with routine healing: Secondary | ICD-10-CM | POA: Diagnosis not present

## 2022-05-21 DIAGNOSIS — F02B18 Dementia in other diseases classified elsewhere, moderate, with other behavioral disturbance: Secondary | ICD-10-CM | POA: Diagnosis not present

## 2022-05-21 DIAGNOSIS — I82401 Acute embolism and thrombosis of unspecified deep veins of right lower extremity: Secondary | ICD-10-CM | POA: Diagnosis not present

## 2022-05-21 DIAGNOSIS — S72001D Fracture of unspecified part of neck of right femur, subsequent encounter for closed fracture with routine healing: Secondary | ICD-10-CM | POA: Diagnosis not present

## 2022-05-21 DIAGNOSIS — F339 Major depressive disorder, recurrent, unspecified: Secondary | ICD-10-CM | POA: Diagnosis not present

## 2022-05-21 DIAGNOSIS — N390 Urinary tract infection, site not specified: Secondary | ICD-10-CM | POA: Diagnosis not present

## 2022-05-21 DIAGNOSIS — G301 Alzheimer's disease with late onset: Secondary | ICD-10-CM | POA: Diagnosis not present

## 2022-05-29 DIAGNOSIS — G301 Alzheimer's disease with late onset: Secondary | ICD-10-CM | POA: Diagnosis not present

## 2022-05-29 DIAGNOSIS — I82401 Acute embolism and thrombosis of unspecified deep veins of right lower extremity: Secondary | ICD-10-CM | POA: Diagnosis not present

## 2022-05-29 DIAGNOSIS — S72001D Fracture of unspecified part of neck of right femur, subsequent encounter for closed fracture with routine healing: Secondary | ICD-10-CM | POA: Diagnosis not present

## 2022-05-29 DIAGNOSIS — N390 Urinary tract infection, site not specified: Secondary | ICD-10-CM | POA: Diagnosis not present

## 2022-05-29 DIAGNOSIS — F339 Major depressive disorder, recurrent, unspecified: Secondary | ICD-10-CM | POA: Diagnosis not present

## 2022-05-29 DIAGNOSIS — F02B18 Dementia in other diseases classified elsewhere, moderate, with other behavioral disturbance: Secondary | ICD-10-CM | POA: Diagnosis not present

## 2022-06-05 ENCOUNTER — Encounter: Payer: Self-pay | Admitting: Internal Medicine

## 2022-06-06 ENCOUNTER — Other Ambulatory Visit: Payer: Self-pay

## 2022-06-06 MED ORDER — ALBUTEROL SULFATE HFA 108 (90 BASE) MCG/ACT IN AERS: INHALATION_SPRAY | RESPIRATORY_TRACT | 3 refills | Status: DC

## 2022-07-15 ENCOUNTER — Encounter: Payer: Self-pay | Admitting: Internal Medicine

## 2022-07-15 ENCOUNTER — Telehealth (INDEPENDENT_AMBULATORY_CARE_PROVIDER_SITE_OTHER): Payer: Medicare Other | Admitting: Internal Medicine

## 2022-07-15 DIAGNOSIS — H109 Unspecified conjunctivitis: Secondary | ICD-10-CM | POA: Diagnosis not present

## 2022-07-15 MED ORDER — AZITHROMYCIN 1 % OP SOLN: 1.0000 [drp] | Freq: Two times a day (BID) | OPHTHALMIC | 0 refills | Status: AC

## 2022-07-15 NOTE — Assessment & Plan Note (Signed)
Rx azithromycin eye drops 1 drop BID for 7 days.

## 2022-07-15 NOTE — Progress Notes (Signed)
Virtual Visit via Video Note  I connected with [name] Barker on 07/15/22 at  8:40 AM EDT by a video enabled telemedicine application and verified that I am speaking with the correct person using two identifiers.  The patient and the provider were at separate locations throughout the entire encounter. Patient location: home, Provider location: work   I discussed the limitations of evaluation and management by telemedicine and the availability of in person appointments. The patient expressed understanding and agreed to proceed. The patient and the provider were the only parties present for the visit unless noted in HPI below.  History of Present Illness: The patient is a 88 y.o. female with visit for left eye redness with matting. Daughter provides most of the history.   Observations/Objective: Appearance: stable, breathing appears normal, casual grooming, left eye with conjunctival redness and mild matting  Assessment and Plan: See problem oriented charting  Follow Up Instructions: rx azithromycin eye drops 1 drop BID for 7 days  I discussed the assessment and treatment plan with the patient. The patient was provided an opportunity to ask questions and all were answered. The patient agreed with the plan and demonstrated an understanding of the instructions.   The patient was advised to call back or seek an in-person evaluation if the symptoms worsen or if the condition fails to improve as anticipated.  [name] Koch, MD

## 2022-07-16 MED ORDER — ERYTHROMYCIN 5 MG/GM OP OINT: 1.0000 | TOPICAL_OINTMENT | Freq: Every day | OPHTHALMIC | 0 refills | Status: DC

## 2022-07-23 ENCOUNTER — Telehealth: Payer: Self-pay | Admitting: Internal Medicine

## 2022-07-23 NOTE — Telephone Encounter (Signed)
Order was signed and dated and faxed back. Confirmation fax has been received.

## 2022-07-23 NOTE — Telephone Encounter (Signed)
[name] Barker from [facility] is requesting a callback. She stated a referral was placed #[id number] for home health. She stated it needs to be signed and dated by the provider.    Please advise.  CB: [phone]

## 2022-08-24 ENCOUNTER — Encounter: Payer: Self-pay | Admitting: Internal Medicine

## 2022-08-29 ENCOUNTER — Other Ambulatory Visit: Payer: Self-pay

## 2022-08-29 MED ORDER — ALBUTEROL SULFATE HFA 108 (90 BASE) MCG/ACT IN AERS: INHALATION_SPRAY | RESPIRATORY_TRACT | 3 refills | Status: DC

## 2022-08-29 MED ORDER — AMOXICILLIN-POT CLAVULANATE 875-125 MG PO TABS: 1.0000 | ORAL_TABLET | Freq: Two times a day (BID) | ORAL | 0 refills | Status: DC

## 2022-09-30 ENCOUNTER — Other Ambulatory Visit: Payer: Self-pay | Admitting: Internal Medicine

## 2022-10-01 NOTE — Telephone Encounter (Signed)
Last LOV--07/15/22 Last refill--3/28

## 2022-10-24 ENCOUNTER — Ambulatory Visit (INDEPENDENT_AMBULATORY_CARE_PROVIDER_SITE_OTHER): Payer: Medicare Other

## 2022-10-24 VITALS — Ht <= 58 in | Wt 124.0 lb

## 2022-10-24 DIAGNOSIS — Z0000 Encounter for general adult medical examination without abnormal findings: Secondary | ICD-10-CM | POA: Diagnosis not present

## 2022-10-24 NOTE — Patient Instructions (Signed)
[name] , Thank you for taking time to come for your Medicare Wellness Visit. I appreciate your ongoing commitment to your health goals. Please review the following plan we discussed and let me know if I can assist you in the future.   These are the goals we discussed:  Goals      Enjoy family, play scrabble, be as active and as independent  as possible     Manage My Medicine     Timeframe:  Long-Range Goal Priority:  High Start Date:      02/05/21                       Expected End Date:    04/01/2023              Follow Up Date: 09/2022   - call for medicine refill 2 or 3 days before it runs out - call if I am sick and can't take my medicine - keep a list of all the medicines I take; vitamins and herbals too - use a pillbox to sort medicine    Why is this important?   These steps will help you keep on track with your medicines.         This is a list of the screening recommended for you and due dates:  Health Maintenance  Topic Date Due   Zoster (Shingles) Vaccine (1 of 2) Never done   DEXA scan (bone density measurement)  Never done   COVID-19 Vaccine (4 - Pfizer risk series) 07/12/2021   Tetanus Vaccine  07/17/2021   Flu Shot  07/29/2022   Medicare Annual Wellness Visit  10/25/2023   Pneumonia Vaccine  Completed   HPV Vaccine  Aged Out    Advanced directives: In Chart   Conditions/risks identified: Aim for 30 minutes of exercise or brisk walking, 6-8 glasses of water, and 5 servings of fruits and vegetables each day.   Next appointment: Follow up in one year for your annual wellness visit    Preventive Care 65 Years and Older, Female Preventive care refers to lifestyle choices and visits with your health care provider that can promote health and wellness. What does preventive care include? A yearly physical exam. This is also called an annual well check. Dental exams once or twice a year. Routine eye exams. Ask your health care provider how often you should  have your eyes checked. Personal lifestyle choices, including: Daily care of your teeth and gums. Regular physical activity. Eating a healthy diet. Avoiding tobacco and drug use. Limiting alcohol use. Practicing safe sex. Taking low-dose aspirin every day. Taking vitamin and mineral supplements as recommended by your health care provider. What happens during an annual well check? The services and screenings done by your health care provider during your annual well check will depend on your age, overall health, lifestyle risk factors, and family history of disease. Counseling  Your health care provider may ask you questions about your: Alcohol use. Tobacco use. Drug use. Emotional well-being. Home and relationship well-being. Sexual activity. Eating habits. History of falls. Memory and ability to understand (cognition). Work and work Statistician. Reproductive health. Screening  You may have the following tests or measurements: Height, weight, and BMI. Blood pressure. Lipid and cholesterol levels. These may be checked every 5 years, or more frequently if you are over 41 years old. Skin check. Lung cancer screening. You may have this screening every year starting at age 85 if you have  a 30-pack-year history of smoking and currently smoke or have quit within the past 15 years. Fecal occult blood test (FOBT) of the stool. You may have this test every year starting at age 3. Flexible sigmoidoscopy or colonoscopy. You may have a sigmoidoscopy every 5 years or a colonoscopy every 10 years starting at age 77. Hepatitis C blood test. Hepatitis B blood test. Sexually transmitted disease (STD) testing. Diabetes screening. This is done by checking your blood sugar (glucose) after you have not eaten for a while (fasting). You may have this done every 1-3 years. Bone density scan. This is done to screen for osteoporosis. You may have this done starting at age 78. Mammogram. This may be done  every 1-2 years. Talk to your health care provider about how often you should have regular mammograms. Talk with your health care provider about your test results, treatment options, and if necessary, the need for more tests. Vaccines  Your health care provider may recommend certain vaccines, such as: Influenza vaccine. This is recommended every year. Tetanus, diphtheria, and acellular pertussis (Tdap, Td) vaccine. You may need a Td booster every 10 years. Zoster vaccine. You may need this after age 33. Pneumococcal 13-valent conjugate (PCV13) vaccine. One dose is recommended after age 49. Pneumococcal polysaccharide (PPSV23) vaccine. One dose is recommended after age 42. Talk to your health care provider about which screenings and vaccines you need and how often you need them. This information is not intended to replace advice given to you by your health care provider. Make sure you discuss any questions you have with your health care provider. Document Released: 01/11/2016 Document Revised: 09/03/2016 Document Reviewed: 10/16/2015 Elsevier Interactive Patient Education  2017 [place] Prevention in the Home Falls can cause injuries. They can happen to people of all ages. There are many things you can do to make your home safe and to help prevent falls. What can I do on the outside of my home? Regularly fix the edges of walkways and driveways and fix any cracks. Remove anything that might make you trip as you walk through a door, such as a raised step or threshold. Trim any bushes or trees on the path to your home. Use bright outdoor lighting. Clear any walking paths of anything that might make someone trip, such as rocks or tools. Regularly check to see if handrails are loose or broken. Make sure that both sides of any steps have handrails. Any raised decks and porches should have guardrails on the edges. Have any leaves, snow, or ice cleared regularly. Use sand or salt on  walking paths during winter. Clean up any spills in your garage right away. This includes oil or grease spills. What can I do in the bathroom? Use night lights. Install grab bars by the toilet and in the tub and shower. Do not use towel bars as grab bars. Use non-skid mats or decals in the tub or shower. If you need to sit down in the shower, use a plastic, non-slip stool. Keep the floor dry. Clean up any water that spills on the floor as soon as it happens. Remove soap buildup in the tub or shower regularly. Attach bath mats securely with double-sided non-slip rug tape. Do not have throw rugs and other things on the floor that can make you trip. What can I do in the bedroom? Use night lights. Make sure that you have a light by your bed that is easy to reach. Do not use any  sheets or blankets that are too big for your bed. They should not hang down onto the floor. Have a firm chair that has side arms. You can use this for support while you get dressed. Do not have throw rugs and other things on the floor that can make you trip. What can I do in the kitchen? Clean up any spills right away. Avoid walking on wet floors. Keep items that you use a lot in easy-to-reach places. If you need to reach something above you, use a strong step stool that has a grab bar. Keep electrical cords out of the way. Do not use floor polish or wax that makes floors slippery. If you must use wax, use non-skid floor wax. Do not have throw rugs and other things on the floor that can make you trip. What can I do with my stairs? Do not leave any items on the stairs. Make sure that there are handrails on both sides of the stairs and use them. Fix handrails that are broken or loose. Make sure that handrails are as long as the stairways. Check any carpeting to make sure that it is firmly attached to the stairs. Fix any carpet that is loose or worn. Avoid having throw rugs at the top or bottom of the stairs. If you do  have throw rugs, attach them to the floor with carpet tape. Make sure that you have a light switch at the top of the stairs and the bottom of the stairs. If you do not have them, ask someone to add them for you. What else can I do to help prevent falls? Wear shoes that: Do not have high heels. Have rubber bottoms. Are comfortable and fit you well. Are closed at the toe. Do not wear sandals. If you use a stepladder: Make sure that it is fully opened. Do not climb a closed stepladder. Make sure that both sides of the stepladder are locked into place. Ask someone to hold it for you, if possible. Clearly mark and make sure that you can see: Any grab bars or handrails. First and last steps. Where the edge of each step is. Use tools that help you move around (mobility aids) if they are needed. These include: Canes. Walkers. Scooters. Crutches. Turn on the lights when you go into a dark area. Replace any light bulbs as soon as they burn out. Set up your furniture so you have a clear path. Avoid moving your furniture around. If any of your floors are uneven, fix them. If there are any pets around you, be aware of where they are. Review your medicines with your doctor. Some medicines can make you feel dizzy. This can increase your chance of falling. Ask your doctor what other things that you can do to help prevent falls. This information is not intended to replace advice given to you by your health care provider. Make sure you discuss any questions you have with your health care provider. Document Released: 10/11/2009 Document Revised: 05/22/2016 Document Reviewed: 01/19/2015 Elsevier Interactive Patient Education  2017 [employer].

## 2022-10-24 NOTE — Progress Notes (Signed)
Subjective:   [name] Barker is a 88 y.o. female who presents for Medicare Annual (Subsequent) preventive examination.   I connected with  [name] Barker on 10/24/22 by a audio enabled telemedicine application and verified that I am speaking with the correct person using two identifiers.  Patient Location: Home  Provider Location: Home Office  I discussed the limitations of evaluation and management by telemedicine. The patient expressed understanding and agreed to proceed.  Review of Systems     Cardiac Risk Factors include: advanced age (>51mn, >>47women);sedentary lifestyle     Objective:    Today's Vitals   10/24/22 1129  Weight: 124 lb (56.2 kg)  Height: '4\' 10"'$  (1.473 m)   Body mass index is 25.92 kg/m.     10/24/2022   11:33 AM 09/13/2018   10:44 AM 08/04/2017    2:38 PM 04/04/2012    7:55 PM  Advanced Directives  Does Patient Have a Medical Advance Directive? Yes Yes No Patient has advance directive, copy not in chart  Type of [place]  Does patient want to make changes to medical advance directive? No - Patient declined No - Patient declined    Copy of [place] Chart? Yes - validated most recent copy scanned in chart (See row information) No - copy requested  Copy requested from family  Would patient like information on creating a medical advance directive?   Yes (ED - Information included in AVS)   Pre-existing out of facility DNR order (yellow form or pink MOST form)    No    Current Medications (verified) Outpatient Encounter Medications as of 10/24/2022  Medication Sig   acetaminophen (TYLENOL) 500 MG tablet Take 500 mg by mouth 2 (two) times a day. FOR BACK PAIN   albuterol (VENTOLIN HFA) 108 (90 Base) MCG/ACT inhaler INHALE 2 PUFFS BY MOUTH EVERY 6 HOURS AS NEEDED FOR WHEEZING OR FOR SHORTNESS OF BREATH   citalopram (CELEXA) 10 MG  tablet Take 1 tablet (10 mg total) by mouth daily.   famotidine (PEPCID) 20 MG tablet Take 1 tablet (20 mg total) by mouth at bedtime.   fexofenadine (ALLEGRA) 180 MG tablet Take 180 mg by mouth in the morning.   fluticasone (FLONASE) 50 MCG/ACT nasal spray Place 1 spray into both nostrils in the morning and at bedtime.   amoxicillin-clavulanate (AUGMENTIN) 875-125 MG tablet Take 1 tablet by mouth 2 (two) times daily. (Patient not taking: Reported on 10/24/2022)   Cranberry 500 MG TABS Take 500 mg by mouth in the morning and at bedtime.   diclofenac Sodium (VOLTAREN) 1 % GEL Apply 4 g topically 4 (four) times daily.   erythromycin ophthalmic ointment Place 1 Application into both eyes at bedtime. (Patient not taking: Reported on 10/24/2022)   LORazepam (ATIVAN) 0.5 MG tablet Take 1 tablet by mouth twice daily as needed for anxiety (Patient not taking: Reported on 10/24/2022)   Multiple Vitamin (MULTIVITAMIN) tablet Take 1 tablet by mouth daily.   Multiple Vitamins-Minerals (PRESERVISION AREDS 2 PO) Take 1 tablet by mouth in the morning and at bedtime.   [DISCONTINUED] pantoprazole (PROTONIX) 40 MG tablet Take 1 tablet (40 mg total) by mouth daily. (Patient not taking: Reported on 02/05/2021)   No facility-administered encounter medications on file as of 10/24/2022.    Allergies (verified) Morphine and related, Flexeril [cyclobenzaprine], Methenamine mandelate, Penicillins, Promethazine hcl, Statins, Ativan [lorazepam], and Morphine sulfate  History: Past Medical History:  Diagnosis Date   Allergic rhinitis    ANEMIA-NOS 11/17/2006   Colon cancer ([place]) 08/19/2007   1994 T2, 2008 T3, N1   Colon cancer ([place])    COVID-19    DIVERTICULOSIS-COLON 05/30/2010   DNR (do not resuscitate)    Dyslipidemia    Femur fracture, left ([place]) 09/13/2018   CLOSED   GERD 11/17/2006   Hemorrhoids    History of CVA (cerebrovascular accident)    HYPERLIPIDEMIA 11/17/2006   LOW BACK PAIN, CHRONIC 02/04/2010    Osteopenia 11/17/2006   Small bowel obstruction ([place])    TIA (transient ischemic attack)    Past Surgical History:  Procedure Laterality Date   ABDOMINAL HYSTERECTOMY     APPENDECTOMY     BACK SURGERY  2007   BELPHAROPTOSIS REPAIR     CATARACT EXTRACTION     HEMICOLECTOMY  1994   Right   INTRAMEDULLARY (IM) NAIL INTERTROCHANTERIC Right 01/27/2021   Procedure: INTRAMEDULLARY (IM) NAIL INTERTROCHANTRIC;  Surgeon: Paralee Cancel, MD;  Location: [facility];  Service: Orthopedics;  Laterality: Right;   JOINT REPLACEMENT     KNEE ARTHROSCOPY     ORIF FEMUR FRACTURE Left 09/13/2018   Procedure: OPEN REDUCTION INTERNAL FIXATION (ORIF) DISTAL FEMUR FRACTURE;  Surgeon: Shona Needles, MD;  Location: [facility];  Service: Orthopedics;  Laterality: Left;   PARTIAL COLECTOMY  2008   ROTATOR CUFF REPAIR     SHOULDER SURGERY     VAGINAL PROLAPSE REPAIR     Family History  Problem Relation Age of Onset   Lung cancer Sister    Colon cancer Sister 81   Kidney cancer Sister    Social History   Socioeconomic History   Marital status: Married    Spouse name: Not on file   Number of children: 6   Years of education: 11   Highest education level: Not on file  Occupational History   Occupation: retired  Tobacco Use   Smoking status: Never   Smokeless tobacco: Never  Vaping Use   Vaping Use: Never used  Substance and Sexual Activity   Alcohol use: Not Currently   Drug use: Never   Sexual activity: Not on file    Comment: not asked  Other Topics Concern   Not on file  Social History Narrative   ** Merged History Encounter **       04/25/19 [name] Barker live with her husband in a 1 story apartment which is attached to their daughters home Has 6 children Is the oldest of 2 children Highest level of education: 11th grade 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   Social Determinants of Health   Financial Resource Strain: [place]  (10/24/2022)   Overall Financial Resource  Strain (CARDIA)    Difficulty of Paying Living Expenses: Not hard at all  Food Insecurity: No Food Insecurity (10/24/2022)   Hunger Vital Sign    Worried About Running Out of Food in the Last Year: Never true    [place] in the Last Year: Never true  Transportation Needs: No Transportation Needs (10/24/2022)   PRAPARE - Hydrologist (Medical): No    Lack of Transportation (Non-Medical): No  Physical Activity: Inactive (10/24/2022)   Exercise Vital Sign    Days of Exercise per Week: 0 days    Minutes of Exercise per Session: 0 min  Stress: No Stress Concern Present (10/24/2022)   [employer] of  Occupational Health - Occupational Stress Questionnaire    Feeling of Stress : Not at all  Social Connections: Moderately Integrated (10/24/2022)   Social Connection and Isolation Panel [NHANES]    Frequency of Communication with Friends and Family: More than three times a week    Frequency of Social Gatherings with Friends and Family: More than three times a week    Attends Religious Services: More than 4 times per year    Active Member of [employer] or Organizations: No    Attends Archivist Meetings: Never    Marital Status: Married    Tobacco Counseling Counseling given: Not Answered   Clinical Intake:                 Diabetic?no          Activities of Daily Living    10/24/2022   11:33 AM 11/25/2021    3:13 PM  In your present state of health, do you have any difficulty performing the following activities:  Hearing? 1 1  Vision? 1 1  Difficulty concentrating or making decisions? 1 1  Walking or climbing stairs? 1 1  Dressing or bathing? 1 1  Doing errands, shopping? 1 1  Preparing Food and eating ? Y   Using the Toilet? Y   In the past six months, have you accidently leaked urine? Y   Do you have problems with loss of bowel control? Y   Managing your Medications? Y   Managing your Finances? Y   Housekeeping or  managing your Housekeeping? Y     Patient Care Team: Hoyt Koch, MD as PCP - General (Internal Medicine) Ladell Pier, MD as Consulting Physician (Oncology) Ladene Artist, MD as Consulting Physician (Gastroenterology) Hoyt Koch, MD (Internal Medicine) Szabat, Darnelle Maffucci, [facility] (Inactive) (Pharmacist)  Indicate any recent Medical Services you may have received from other than [facility] providers in the past year (date may be approximate).     Assessment:   This is a routine wellness examination for [name] Barker.  Hearing/Vision screen Vision Screening - Comments:: Declined at this time  Dietary issues and exercise activities discussed: Current Exercise Habits: The patient does not participate in regular exercise at present   Goals Addressed             This Visit's Progress    Enjoy family, play scrabble, be as active and as independent  as possible   On track      Depression Screen    10/24/2022   11:32 AM 11/25/2021    3:12 PM 07/27/2020    7:26 AM 08/04/2017    2:39 PM 11/02/2015    1:51 PM 09/26/2013    9:41 AM 09/26/2013    9:27 AM  PHQ 2/9 Scores  PHQ - 2 Score 0 0 0 1 0 0 0  PHQ- 9 Score  0  2       Fall Risk    10/24/2022   11:29 AM 11/25/2021    3:11 PM 07/27/2020    7:26 AM 07/28/2019    6:08 PM 11/16/2018    2:57 PM  Fall Risk   Falls in the past year? 1 0 0  1  Comment    Emmi Telephone Survey: data to providers prior to load Emmi Telephone Survey: data to providers prior to load  Number falls in past yr: 1 0   1  Comment    Emmi Telephone Survey Actual Response =  Emmi Telephone Survey Actual  Response = 1  Injury with Fall? 1 0   1  Risk for fall due to : History of fall(s);Impaired balance/gait;Orthopedic patient      Follow up Education provided;Falls prevention discussed        FALL RISK PREVENTION PERTAINING TO THE HOME:  Any stairs in or around the home? No  If so, are there any without handrails? No  Home free of loose  throw rugs in walkways, pet beds, electrical cords, etc? Yes  Adequate lighting in your home to reduce risk of falls? No   ASSISTIVE DEVICES UTILIZED TO PREVENT FALLS:  Life alert? No  Use of a cane, walker or w/c? Yes  Grab bars in the bathroom? Yes  Shower chair or bench in shower? Yes  Elevated toilet seat or a handicapped toilet? No       10/13/2017    2:00 PM 08/04/2017    2:11 PM  MMSE - Mini Mental State Exam  Orientation to time 2 2  Orientation to Place 4 3  Registration 3 3  Attention/ Calculation 4 5  Recall 1 0  Language- name 2 objects 2 2  Language- repeat 1 1  Language- follow 3 step command 2 3  Language- read & follow direction 1 1  Write a sentence 1 1  Copy design 0 1  Total score 21 22        10/24/2022   11:34 AM  6CIT Screen  What Year? 4 points  What month? 3 points  Count back from 20 2 points  Months in reverse 2 points  Repeat phrase 4 points    Immunizations Immunization History  Administered Date(s) Administered   Influenza Split 09/29/2011   Influenza Whole 10/03/2008, 09/28/2012   Influenza, High Dose Seasonal PF 09/14/2018   Influenza,inj,Quad PF,6+ Mos 09/12/2013   Influenza,trivalent, recombinat, inj, PF 09/26/2013   Influenza-Unspecified 09/28/2014, 10/13/2015, 09/25/2017, 09/14/2018, 09/24/2019   PFIZER(Purple Top)SARS-COV-2 Vaccination 02/26/2020, 03/21/2020, 05/17/2021   Pneumococcal Conjugate-13 06/07/2015   Pneumococcal Polysaccharide-23 10/29/2006   Td 12/29/1994   Tdap 07/18/2011   Zoster, Live 08/25/2011    TDAP status: Up to date  Flu Vaccine status: Due, Education has been provided regarding the importance of this vaccine. Advised may receive this vaccine at local pharmacy or Health Dept. Aware to provide a copy of the vaccination record if obtained from local pharmacy or Health Dept. Verbalized acceptance and understanding.  Pneumococcal vaccine status: Up to date  Covid-19 vaccine status: Completed  vaccines  Qualifies for Shingles Vaccine? Yes   Zostavax completed Yes   Shingrix Completed?: No.    Education has been provided regarding the importance of this vaccine. Patient has been advised to call insurance company to determine out of pocket expense if they have not yet received this vaccine. Advised may also receive vaccine at local pharmacy or Health Dept. Verbalized acceptance and understanding.  Screening Tests Health Maintenance  Topic Date Due   Zoster Vaccines- Shingrix (1 of 2) Never done   DEXA SCAN  Never done   COVID-19 Vaccine (4 - Pfizer risk series) 07/12/2021   TETANUS/TDAP  07/17/2021   INFLUENZA VACCINE  07/29/2022   Medicare Annual Wellness (AWV)  10/25/2023   Pneumonia Vaccine 65+ Years old  Completed   HPV VACCINES  Aged Out    Health Maintenance  Health Maintenance Due  Topic Date Due   Zoster Vaccines- Shingrix (1 of 2) Never done   DEXA SCAN  Never done   COVID-19 Vaccine (4 -  Pfizer risk series) 07/12/2021   TETANUS/TDAP  07/17/2021   INFLUENZA VACCINE  07/29/2022    Colorectal cancer screening: No longer required.   Mammogram status: No longer required due to age.  Bone Density status: Ordered Patient declined . Pt provided with contact info and advised to call to schedule appt.  Lung Cancer Screening: (Low Dose CT Chest recommended if Age 65-80 years, 30 pack-year currently smoking OR have quit w/in 15years.) does not qualify.   Lung Cancer Screening Referral: n/a  Additional Screening:  Hepatitis C Screening: does not qualify;   Vision Screening: Recommended annual ophthalmology exams for early detection of glaucoma and other disorders of the eye. Is the patient up to date with their annual eye exam?  No  Who is the provider or what is the name of the office in which the patient attends annual eye exams? Declined at this time  If pt is not established with a provider, would they like to be referred to a provider to establish care? No .    Dental Screening: Recommended annual dental exams for proper oral hygiene  Community Resource Referral / Chronic Care Management: CRR required this visit?  No   CCM required this visit?  No      Plan:     I have personally reviewed and noted the following in the patient's chart:   Medical and social history Use of alcohol, tobacco or illicit drugs  Current medications and supplements including opioid prescriptions. Patient is not currently taking opioid prescriptions. Functional ability and status Nutritional status Physical activity Advanced directives List of other physicians Hospitalizations, surgeries, and ER visits in previous 12 months Vitals Screenings to include cognitive, depression, and falls Referrals and appointments  In addition, I have reviewed and discussed with patient certain preventive protocols, quality metrics, and best practice recommendations. A written personalized care plan for preventive services as well as general preventive health recommendations were provided to patient.     Daphane Shepherd, LPN   [id number]   Nurse Notes: Due DEXA/Flu vaccine

## 2022-11-17 DIAGNOSIS — Z23 Encounter for immunization: Secondary | ICD-10-CM | POA: Diagnosis not present

## 2022-12-01 ENCOUNTER — Telehealth: Payer: Medicare Other

## 2023-01-12 ENCOUNTER — Encounter: Payer: Self-pay | Admitting: Internal Medicine

## 2023-01-13 MED ORDER — CITALOPRAM HYDROBROMIDE 10 MG PO TABS: 10.0000 mg | ORAL_TABLET | Freq: Every day | ORAL | 3 refills | Status: DC

## 2023-08-27 ENCOUNTER — Encounter: Payer: Self-pay | Admitting: Internal Medicine

## 2023-08-28 MED ORDER — AMOXICILLIN-POT CLAVULANATE 875-125 MG PO TABS: 1.0000 | ORAL_TABLET | Freq: Two times a day (BID) | ORAL | 3 refills | Status: DC

## 2023-08-28 MED ORDER — LORAZEPAM 0.5 MG PO TABS: 0.5000 mg | ORAL_TABLET | Freq: Two times a day (BID) | ORAL | 3 refills | Status: DC | PRN

## 2023-08-28 NOTE — Addendum Note (Signed)
Addended by: Hillard Danker A on: 08/28/2023 11:57 AM   Modules accepted: Orders

## 2023-10-01 DIAGNOSIS — Z23 Encounter for immunization: Secondary | ICD-10-CM | POA: Diagnosis not present

## 2023-10-27 ENCOUNTER — Ambulatory Visit (INDEPENDENT_AMBULATORY_CARE_PROVIDER_SITE_OTHER): Payer: Medicare Other

## 2023-10-27 VITALS — Ht 61.0 in | Wt 124.0 lb

## 2023-10-27 DIAGNOSIS — Z0000 Encounter for general adult medical examination without abnormal findings: Secondary | ICD-10-CM | POA: Diagnosis not present

## 2023-10-27 NOTE — Progress Notes (Signed)
Subjective:   [name] Barker is a 89 y.o. female who presents for Medicare Annual (Subsequent) preventive examination.  Visit Complete: Virtual I connected with  [name] Barker on 10/27/23 by a audio enabled telemedicine application and verified that I am speaking with the correct person using two identifiers.  Patient Location: Home  Provider Location: Home Office  I discussed the limitations of evaluation and management by telemedicine. The patient expressed understanding and agreed to proceed.  Vital Signs: Because this visit was a virtual/telehealth visit, some criteria may be missing or patient reported. Any vitals not documented were not able to be obtained and vitals that have been documented are patient reported.  Patient Medicare AWV 6CIT Score and PHQ2-9 and Stress questionnaires were not done today due to patient's condition.  Cardiac Risk Factors include: advanced age (>33men, >71 women);Other (see comment);dyslipidemia, Risk factor comments: CAD, Hearing loss, Dementia     Objective:    Today's Vitals   10/27/23 1458  Weight: 124 lb (56.2 kg)  Height: 5\' 1"  (1.549 m)   Body mass index is 23.43 kg/m.     10/27/2023    3:26 PM 10/24/2022   11:33 AM 03/31/2019    6:00 PM 09/13/2018   10:44 AM 08/04/2017    2:38 PM 04/04/2012    7:55 PM  Advanced Directives  Does Patient Have a Medical Advance Directive? Yes Yes -- Yes No Patient has advance directive, copy not in chart  Type of Advance Directive Healthcare Power of [place];Living will Healthcare Power of [place];Living will  Healthcare Power of Attorney  Living will  Does patient want to make changes to medical advance directive? No - Patient declined No - Patient declined  No - Patient declined    Copy of Healthcare Power of Attorney in Chart? Yes - validated most recent copy scanned in chart (See row information) Yes - validated most recent copy scanned in chart (See row information)  No - copy requested   Copy requested from family  Would patient like information on creating a medical advance directive?     Yes (ED - Information included in AVS)   Pre-existing out of facility DNR order (yellow form or pink MOST form)      No    Current Medications (verified) Outpatient Encounter Medications as of 10/27/2023  Medication Sig   acetaminophen (TYLENOL) 500 MG tablet Take 500 mg by mouth 2 (two) times a day. FOR BACK PAIN   albuterol (VENTOLIN HFA) 108 (90 Base) MCG/ACT inhaler INHALE 2 PUFFS BY MOUTH EVERY 6 HOURS AS NEEDED FOR WHEEZING OR FOR SHORTNESS OF BREATH   amoxicillin-clavulanate (AUGMENTIN) 875-125 MG tablet Take 1 tablet by mouth 2 (two) times daily.   citalopram (CELEXA) 10 MG tablet Take 1 tablet (10 mg total) by mouth daily.   Cranberry 500 MG TABS Take 500 mg by mouth in the morning and at bedtime.   erythromycin ophthalmic ointment Place 1 Application into both eyes at bedtime.   famotidine (PEPCID) 20 MG tablet Take 1 tablet (20 mg total) by mouth at bedtime.   fexofenadine (ALLEGRA) 180 MG tablet Take 180 mg by mouth in the morning.   fluticasone (FLONASE) 50 MCG/ACT nasal spray Place 1 spray into both nostrils in the morning and at bedtime.   LORazepam (ATIVAN) 0.5 MG tablet Take 1 tablet (0.5 mg total) by mouth 2 (two) times daily as needed. for anxiety   diclofenac Sodium (VOLTAREN) 1 % GEL Apply 4 g topically 4 (four) times  daily.   Multiple Vitamin (MULTIVITAMIN) tablet Take 1 tablet by mouth daily.   Multiple Vitamins-Minerals (PRESERVISION AREDS 2 PO) Take 1 tablet by mouth in the morning and at bedtime.   [DISCONTINUED] pantoprazole (PROTONIX) 40 MG tablet Take 1 tablet (40 mg total) by mouth daily. (Patient not taking: Reported on 02/05/2021)   No facility-administered encounter medications on file as of 10/27/2023.    Allergies (verified) Morphine and codeine, Flexeril [cyclobenzaprine], Methenamine mandelate, Penicillins, Promethazine hcl, Statins, Ativan [lorazepam],  and Morphine sulfate   History: Past Medical History:  Diagnosis Date   Allergic rhinitis    ANEMIA-NOS 11/17/2006   Colon cancer (HCC) 08/19/2007   1994 T2, 2008 T3, N1   Colon cancer (HCC)    COVID-19    DIVERTICULOSIS-COLON 05/30/2010   DNR (do not resuscitate)    Dyslipidemia    Femur fracture, left (HCC) 09/13/2018   CLOSED   GERD 11/17/2006   Hemorrhoids    History of CVA (cerebrovascular accident)    HYPERLIPIDEMIA 11/17/2006   LOW BACK PAIN, CHRONIC 02/04/2010   Osteopenia 11/17/2006   Small bowel obstruction (HCC)    TIA (transient ischemic attack)    Past Surgical History:  Procedure Laterality Date   ABDOMINAL HYSTERECTOMY     APPENDECTOMY     BACK SURGERY  2007   BELPHAROPTOSIS REPAIR     CATARACT EXTRACTION     HEMICOLECTOMY  1994   Right   INTRAMEDULLARY (IM) NAIL INTERTROCHANTERIC Right 01/27/2021   Procedure: INTRAMEDULLARY (IM) NAIL INTERTROCHANTRIC;  Surgeon: Durene Romans, MD;  Location: [facility] OR;  Service: Orthopedics;  Laterality: Right;   JOINT REPLACEMENT     KNEE ARTHROSCOPY     ORIF FEMUR FRACTURE Left 09/13/2018   Procedure: OPEN REDUCTION INTERNAL FIXATION (ORIF) DISTAL FEMUR FRACTURE;  Surgeon: Roby Lofts, MD;  Location: [facility] OR;  Service: Orthopedics;  Laterality: Left;   PARTIAL COLECTOMY  2008   ROTATOR CUFF REPAIR     SHOULDER SURGERY     VAGINAL PROLAPSE REPAIR     Family History  Problem Relation Age of Onset   Lung cancer Sister    Colon cancer Sister 88   Kidney cancer Sister    Social History   Socioeconomic History   Marital status: Married    Spouse name: [name] Barker   Number of children: 6   Years of education: 11   Highest education level: Not on file  Occupational History   Occupation: retired  Tobacco Use   Smoking status: Never   Smokeless tobacco: Never  Vaping Use   Vaping status: Never Used  Substance and Sexual Activity   Alcohol use: Not Currently   Drug use: Never   Sexual activity: Not on file    Comment: not  asked  Other Topics Concern   Not on file  Social History Narrative   ** Merged History Encounter ** 04/25/19 [name] Barker live with her husband in a 1 story apartment which is attached to their daughters home   Has 6 children   Is the oldest of 18 children   Highest level of education: 11th grade   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      2024-daughter stays with parent-care giver   Social Determinants of Health   Financial Resource Strain: Low Risk  (10/27/2023)   Overall Financial Resource Strain (CARDIA)    Difficulty of Paying Living Expenses: Not hard at all  Food Insecurity: No Food Insecurity (10/27/2023)   Hunger Vital  Sign    Worried About Programme researcher, broadcasting/film/video in the Last Year: Never true    Ran Out of Food in the Last Year: Never true  Transportation Needs: No Transportation Needs (10/27/2023)   PRAPARE - Administrator, Civil Service (Medical): No    Lack of Transportation (Non-Medical): No  Physical Activity: Inactive (10/27/2023)   Exercise Vital Sign    Days of Exercise per Week: 0 days    Minutes of Exercise per Session: 0 min  Stress: No Stress Concern Present (10/24/2022)   [employer] of Occupational Health - Occupational Stress Questionnaire    Feeling of Stress : Not at all  Social Connections: Patient Unable To Answer (10/27/2023)   Social Connection and Isolation Panel [NHANES]    Frequency of Communication with Friends and Family: Patient unable to answer    Frequency of Social Gatherings with Friends and Family: Patient unable to answer    Attends Religious Services: Patient unable to answer    Active Member of Clubs or Organizations: Patient unable to answer    Attends Banker Meetings: Patient unable to answer    Marital Status: Patient unable to answer    Tobacco Counseling Counseling given: Not Answered   Clinical Intake:  Pre-visit preparation completed: Yes  Pain : No/denies pain      BMI - recorded: 23.43 Nutritional Status: BMI of 19-24  Normal Nutritional Risks: None Diabetes: No  How often do you need to have someone help you when you read instructions, pamphlets, or other written materials from your doctor or pharmacy?: 5 - Always     Comments: Her daughter, Corrie Dandy did visit for patient Information entered by :: Timithy Arons, RMA   Activities of Daily Living    10/27/2023    3:21 PM  In your present state of health, do you have any difficulty performing the following activities:  Hearing? 1  Comment wears hearing aides  Vision? 0  Difficulty concentrating or making decisions? 1  Walking or climbing stairs? 1  Dressing or bathing? 1  Comment Daught helps her  Doing errands, shopping? 1  Comment Daughter drives her  Preparing Food and eating ? Y  Using the Toilet? Y  In the past six months, have you accidently leaked urine? Y  Do you have problems with loss of bowel control? Y  Managing your Medications? Y  Managing your Finances? Y  Housekeeping or managing your Housekeeping? Y    Patient Care Team: Myrlene Broker, MD as PCP - General (Internal Medicine) Ladene Artist, MD as Consulting Physician (Oncology) Meryl Dare, MD as Consulting Physician (Gastroenterology) Myrlene Broker, MD (Internal Medicine) Szabat, Vinnie Level, [facility] (Inactive) (Pharmacist)  Indicate any recent Medical Services you may have received from other than [facility] providers in the past year (date may be approximate).     Assessment:   This is a routine wellness examination for [place].  Hearing/Vision screen Hearing Screening - Comments:: Wears hearing aides Vision Screening - Comments:: Wears eyeglasses   Goals Addressed   None   Depression Screen    10/24/2022   11:32 AM 11/25/2021    3:12 PM 07/27/2020    7:26 AM 08/04/2017    2:39 PM 11/02/2015    1:51 PM 09/26/2013    9:41 AM 09/26/2013    9:27 AM  PHQ 2/9 Scores  PHQ - 2 Score 0 0 0 1 0 0  0  PHQ- 9 Score  0  2  Fall Risk    10/27/2023    3:26 PM 10/24/2022   11:29 AM 11/25/2021    3:11 PM 07/27/2020    7:26 AM 07/28/2019    6:08 PM  Fall Risk   Falls in the past year? 0 1 0 0 --  Comment     Emmi Telephone Survey: data to providers prior to load  Number falls in past yr: 0 1 0  --  Comment     Emmi Telephone Survey Actual Response =   Injury with Fall? 0 1 0    Risk for fall due to : No Fall Risks History of fall(s);Impaired balance/gait;Orthopedic patient     Follow up Falls prevention discussed;Falls evaluation completed Education provided;Falls prevention discussed       MEDICARE RISK AT HOME: Medicare Risk at Home Any stairs in or around the home?: No Home free of loose throw rugs in walkways, pet beds, electrical cords, etc?: Yes Adequate lighting in your home to reduce risk of falls?: Yes Life alert?: No Use of a cane, walker or w/c?: Yes Grab bars in the bathroom?: Yes Shower chair or bench in shower?: Yes Elevated toilet seat or a handicapped toilet?: Yes  TIMED UP AND GO:  Was the test performed?  No    Cognitive Function:    10/13/2017    2:00 PM 08/04/2017    2:11 PM  MMSE - Mini Mental State Exam  Orientation to time 2 2  Orientation to Place 4 3  Registration 3 3  Attention/ Calculation 4 5  Recall 1 0  Language- name 2 objects 2 2  Language- repeat 1 1  Language- follow 3 step command 2 3  Language- read & follow direction 1 1  Write a sentence 1 1  Copy design 0 1  Total score 21 22        10/24/2022   11:34 AM  6CIT Screen  What Year? 4 points  What month? 3 points  Count back from 20 2 points  Months in reverse 2 points  Repeat phrase 4 points    Immunizations Immunization History  Administered Date(s) Administered   Influenza Split 09/29/2011   Influenza Whole 10/03/2008, 09/28/2012   Influenza, High Dose Seasonal PF 09/14/2018   Influenza,inj,Quad PF,6+ Mos 09/12/2013   Influenza,trivalent, recombinat, inj,  PF 09/26/2013   Influenza-Unspecified 09/28/2014, 10/13/2015, 09/25/2017, 09/14/2018, 09/24/2019   PFIZER(Purple Top)SARS-COV-2 Vaccination 02/26/2020, 03/21/2020, 05/17/2021   Pneumococcal Conjugate-13 06/07/2015   Pneumococcal Polysaccharide-23 10/29/2006   Td 12/29/1994   Tdap 07/18/2011   Zoster, Live 08/25/2011    TDAP status: Due, Education has been provided regarding the importance of this vaccine. Advised may receive this vaccine at local pharmacy or Health Dept. Aware to provide a copy of the vaccination record if obtained from local pharmacy or Health Dept. Verbalized acceptance and understanding.  Flu Vaccine status: Due, Education has been provided regarding the importance of this vaccine. Advised may receive this vaccine at local pharmacy or Health Dept. Aware to provide a copy of the vaccination record if obtained from local pharmacy or Health Dept. Verbalized acceptance and understanding.  Pneumococcal vaccine status: Up to date  Covid-19 vaccine status: Information provided on how to obtain vaccines.   Qualifies for Shingles Vaccine? Yes   Zostavax completed Yes   Shingrix Completed?: No.    Education has been provided regarding the importance of this vaccine. Patient has been advised to call insurance company to determine out of pocket expense if they have not  yet received this vaccine. Advised may also receive vaccine at local pharmacy or Health Dept. Verbalized acceptance and understanding.  Screening Tests Health Maintenance  Topic Date Due   Zoster Vaccines- Shingrix (1 of 2) 05/14/1940   DEXA SCAN  Never done   DTaP/Tdap/Td (3 - Td or Tdap) 07/17/2021   INFLUENZA VACCINE  07/30/2023   COVID-19 Vaccine (4 - 2023-24 season) 08/30/2023   Medicare Annual Wellness (AWV)  10/26/2024   Pneumonia Vaccine 41+ Years old  Completed   HPV VACCINES  Aged Out    Health Maintenance  Health Maintenance Due  Topic Date Due   Zoster Vaccines- Shingrix (1 of 2) 05/14/1940    DEXA SCAN  Never done   DTaP/Tdap/Td (3 - Td or Tdap) 07/17/2021   INFLUENZA VACCINE  07/30/2023   COVID-19 Vaccine (4 - 2023-24 season) 08/30/2023    Colorectal cancer screening: No longer required.   Mammogram status: No longer required due to age.  Lung Cancer Screening: (Low Dose CT Chest recommended if Age 31-80 years, 20 pack-year currently smoking OR have quit w/in 15years.) does not qualify.   Lung Cancer Screening Referral: N/A  Additional Screening:  Hepatitis C Screening: does qualify;   Vision Screening: Recommended annual ophthalmology exams for early detection of glaucoma and other disorders of the eye. Is the patient up to date with their annual eye exam?  No  Who is the provider or what is the name of the office in which the patient attends annual eye exams? N/A If pt is not established with a provider, would they like to be referred to a provider to establish care? No .   Dental Screening: Recommended annual dental exams for proper oral hygiene   Community Resource Referral / Chronic Care Management: CRR required this visit?  No   CCM required this visit?  No     Plan:     I have personally reviewed and noted the following in the patient's chart:   Medical and social history Use of alcohol, tobacco or illicit drugs  Current medications and supplements including opioid prescriptions. Patient is not currently taking opioid prescriptions. Functional ability and status Nutritional status Physical activity Advanced directives List of other physicians Hospitalizations, surgeries, and ER visits in previous 12 months Vitals Screenings to include cognitive, depression, and falls Referrals and appointments  In addition, I have reviewed and discussed with patient certain preventive protocols, quality metrics, and best practice recommendations. A written personalized care plan for preventive services as well as general preventive health recommendations were  provided to patient.     Annalysia Willenbring L Tonimarie Gritz, CMA   10/27/2023   After Visit Summary: (MyChart) Due to this being a telephonic visit, the after visit summary with patients personalized plan was offered to patient via MyChart   Nurse Notes: Patient's daughter did visit for patient due to her condition of Dementia. Patient is due for a Tetanus vaccine and the Shingrix vaccine.   Daughter stated that patient had a Flu and Covid vaccine done at [facility] on [address], on 10/01/23, however it has not been documented in the [place] as of yet.  There were no concerns that she wanted to address today.

## 2023-10-27 NOTE — Patient Instructions (Addendum)
[name] , Thank you for taking time to come for your Medicare Wellness Visit. I appreciate your ongoing commitment to your health goals. Please review the following plan we discussed and let me know if I can assist you in the future.   Referrals/Orders/Follow-Ups/Clinician Recommendations: You are due for a Tetanus vaccine and a Shingles vaccine.  Keep up the good work.    This is a list of the screening recommended for you and due dates:  Health Maintenance  Topic Date Due   Zoster (Shingles) Vaccine (1 of 2) 05/14/1940   DEXA scan (bone density measurement)  Never done   DTaP/Tdap/Td vaccine (3 - Td or Tdap) 07/17/2021   Flu Shot  07/30/2023   COVID-19 Vaccine (4 - 2023-24 season) 08/30/2023   Medicare Annual Wellness Visit  10/26/2024   Pneumonia Vaccine  Completed   HPV Vaccine  Aged Out    Advanced directives: (In Chart) A copy of your advanced directives are scanned into your chart should your provider ever need it.  Next Medicare Annual Wellness Visit scheduled for next year: No

## 2023-12-28 ENCOUNTER — Encounter: Payer: Self-pay | Admitting: Internal Medicine

## 2023-12-28 MED ORDER — CITALOPRAM HYDROBROMIDE 10 MG PO TABS: 10.0000 mg | ORAL_TABLET | Freq: Every day | ORAL | 3 refills | Status: DC

## 2023-12-28 NOTE — Telephone Encounter (Signed)
Patient is overdue for an appointment.

## 2023-12-28 NOTE — Telephone Encounter (Signed)
Ok to refill 

## 2024-01-25 ENCOUNTER — Encounter: Payer: Self-pay | Admitting: Internal Medicine

## 2024-01-25 ENCOUNTER — Telehealth (INDEPENDENT_AMBULATORY_CARE_PROVIDER_SITE_OTHER): Payer: Medicare Other | Admitting: Internal Medicine

## 2024-01-25 DIAGNOSIS — F02C0 Dementia in other diseases classified elsewhere, severe, without behavioral disturbance, psychotic disturbance, mood disturbance, and anxiety: Secondary | ICD-10-CM

## 2024-01-25 DIAGNOSIS — E782 Mixed hyperlipidemia: Secondary | ICD-10-CM

## 2024-01-25 DIAGNOSIS — G301 Alzheimer's disease with late onset: Secondary | ICD-10-CM | POA: Diagnosis not present

## 2024-01-25 DIAGNOSIS — Z8673 Personal history of transient ischemic attack (TIA), and cerebral infarction without residual deficits: Secondary | ICD-10-CM

## 2024-01-25 DIAGNOSIS — F3342 Major depressive disorder, recurrent, in full remission: Secondary | ICD-10-CM

## 2024-01-25 MED ORDER — AMOXICILLIN-POT CLAVULANATE 875-125 MG PO TABS: 1.0000 | ORAL_TABLET | Freq: Two times a day (BID) | ORAL | 3 refills | Status: DC

## 2024-01-25 MED ORDER — LORAZEPAM 0.5 MG PO TABS: 0.5000 mg | ORAL_TABLET | Freq: Two times a day (BID) | ORAL | 0 refills | Status: DC | PRN

## 2024-01-25 MED ORDER — ALBUTEROL SULFATE HFA 108 (90 BASE) MCG/ACT IN AERS: INHALATION_SPRAY | RESPIRATORY_TRACT | 3 refills | Status: DC

## 2024-01-25 MED ORDER — FAMOTIDINE 20 MG PO TABS: 20.0000 mg | ORAL_TABLET | Freq: Every day | ORAL | 3 refills | Status: AC

## 2024-01-25 NOTE — Progress Notes (Unsigned)
Virtual Visit via Video Note  I connected with [name] Barker on 01/25/24 at  2:40 PM EST by a video enabled telemedicine application and verified that I am speaking with the correct person using two identifiers.  The patient and the provider were at separate locations throughout the entire encounter. Patient location: home, Provider location: work   I discussed the limitations of evaluation and management by telemedicine and the availability of in person appointments. The patient expressed understanding and agreed to proceed. The patient and the provider were the only parties present for the visit unless noted in HPI below.  History of Present Illness: The patient is a 89 y.o. female with visit for follow up. No changes to health daughter provides history.   Observations/Objective: Appearance: awake and alert, memory impaired  Assessment and Plan: See problem oriented charting  Follow Up Instructions: continue current medications refilled and due to goals of care no labs appropriate  I discussed the assessment and treatment plan with the patient. The patient was provided an opportunity to ask questions and all were answered. The patient agreed with the plan and demonstrated an understanding of the instructions.   The patient was advised to call back or seek an in-person evaluation if the symptoms worsen or if the condition fails to improve as anticipated.  Myrlene Broker, MD

## 2024-01-27 ENCOUNTER — Encounter: Payer: Self-pay | Admitting: Internal Medicine

## 2024-01-27 NOTE — Assessment & Plan Note (Signed)
Off meds due to goals of care. No labs.

## 2024-01-27 NOTE — Assessment & Plan Note (Signed)
We agree that labs are not in goals of care. No new stroke symptoms.

## 2024-01-27 NOTE — Assessment & Plan Note (Signed)
Taking celexa 10 mg daily and well controlled. Continue

## 2024-01-27 NOTE — Assessment & Plan Note (Signed)
Overall progressive and unable to recognize family members most of the time. She is not having any behavioral changes. Has access to ativan but this makes her very drowsy.

## 2024-01-29 ENCOUNTER — Telehealth: Payer: Medicare Other | Admitting: Physician Assistant

## 2024-01-29 DIAGNOSIS — U071 COVID-19: Secondary | ICD-10-CM

## 2024-01-29 MED ORDER — NIRMATRELVIR/RITONAVIR (PAXLOVID) TABLET (RENAL DOSING): 2.0000 | ORAL_TABLET | Freq: Two times a day (BID) | ORAL | 0 refills | Status: AC

## 2024-01-29 NOTE — Progress Notes (Signed)
Virtual Visit Consent   [name], you are scheduled for a virtual visit with a [facility] provider today. Just as with appointments in the office, your consent must be obtained to participate. Your consent will be active for this visit and any virtual visit you may have with one of our providers in the next 365 days. If you have a MyChart account, a copy of this consent can be sent to you electronically.  As this is a virtual visit, video technology does not allow for your provider to perform a traditional examination. This may limit your provider's ability to fully assess your condition. If your provider identifies any concerns that need to be evaluated in person or the need to arrange testing (such as labs, EKG, etc.), we will make arrangements to do so. Although advances in technology are sophisticated, we cannot ensure that it will always work on either your end or our end. If the connection with a video visit is poor, the visit may have to be switched to a telephone visit. With either a video or telephone visit, we are not always able to ensure that we have a secure connection.  By engaging in this virtual visit, you consent to the provision of healthcare and authorize for your insurance to be billed (if applicable) for the services provided during this visit. Depending on your insurance coverage, you may receive a charge related to this service.  I need to obtain your verbal consent now. Are you willing to proceed with your visit today? [name] Barker has provided verbal consent on 01/29/2024 for a virtual visit (video or telephone). Margaretann Loveless, PA-C  Date: 01/29/2024 11:32 AM  Virtual Visit via Video Note   I, Margaretann Loveless, connected with  [name] Barker  ([id number], March 27, 1921) on 01/29/24 at 11:30 AM EST by a video-enabled telemedicine application and verified that I am speaking with the correct person using two identifiers. Daughter, [name], assisted  with the visit.  Location: Patient: Virtual Visit Location Patient: Home Provider: Virtual Visit Location Provider: Home Office   I discussed the limitations of evaluation and management by telemedicine and the availability of in person appointments. The patient expressed understanding and agreed to proceed.    History of Present Illness: [name] Barker is a 89 y.o. who identifies as a female who was assigned female at birth, and is being seen today for Covid 89.  HPI: URI  This is a new problem. The current episode started yesterday. The problem has been gradually worsening. The maximum temperature recorded prior to her arrival was 100.4 - 100.9 F. The fever has been present for Less than 1 day. Associated symptoms include congestion, coughing (dry), headaches and rhinorrhea. Pertinent negatives include no abdominal pain, diarrhea, ear pain, nausea, plugged ear sensation, sinus pain, sore throat or vomiting. Associated symptoms comments: Confusion increased from baseline. She has tried acetaminophen for the symptoms. The treatment provided no relief.   T: 100 BP 146/81 HR 70 O2 sats 92%   Has had covid 19 previously. Is vaccinated, last Oct 2024  Problems:  Patient Active Problem List   Diagnosis Date Noted   DNR (do not resuscitate)    Hx of completed stroke 03/31/2019   Vasomotor rhinitis 11/17/2018   Irregular heartbeat 09/21/2017   Dementia (HCC) 08/10/2017   Bilateral hearing loss 08/07/2016   Depression 08/07/2016   Vitamin D deficiency 11/04/2015   Routine general medical examination at a health care facility 06/08/2015  CAD 06/21/2010   ADENOCARCINOMA, COLON 08/19/2007   Hyperlipidemia 11/17/2006   GERD 11/17/2006    Allergies:  Allergies  Allergen Reactions   Morphine And Codeine Other (See Comments)    Blisters at site and along vein   Flexeril [Cyclobenzaprine] Other (See Comments)    Nightmares    Methenamine Mandelate Other (See Comments)    Burning  sensation   Penicillins Hives    Did it involve swelling of the face/tongue/throat, SOB, or low BP? Yes Did it involve sudden or severe rash/hives, skin peeling, or any reaction on the inside of your mouth or nose? Unk Did you need to seek medical attention at a hospital or doctor's office? Unk When did it last happen? Childhood If all above answers are "NO", may proceed with cephalosporin use.    Promethazine Hcl Other (See Comments)    Hallucinations   Statins Other (See Comments)    Muscle pain    Ativan [Lorazepam] Anxiety   Morphine Sulfate Rash and Other (See Comments)    Blisters all over body   Medications:  Current Outpatient Medications:    nirmatrelvir/ritonavir, renal dosing, (PAXLOVID) 10 x 150 MG & 10 x 100MG  TABS, Take 2 tablets by mouth 2 (two) times daily for 5 days. (Take nirmatrelvir 150 mg one tablet twice daily for 5 days and ritonavir 100 mg one tablet twice daily for 5 days), Disp: 20 tablet, Rfl: 0   acetaminophen (TYLENOL) 500 MG tablet, Take 500 mg by mouth 2 (two) times a day. FOR BACK PAIN, Disp: , Rfl:    albuterol (VENTOLIN HFA) 108 (90 Base) MCG/ACT inhaler, INHALE 2 PUFFS BY MOUTH EVERY 6 HOURS AS NEEDED FOR WHEEZING OR FOR SHORTNESS OF BREATH, Disp: 18 g, Rfl: 3   amoxicillin-clavulanate (AUGMENTIN) 875-125 MG tablet, Take 1 tablet by mouth 2 (two) times daily., Disp: 20 tablet, Rfl: 3   citalopram (CELEXA) 10 MG tablet, Take 1 tablet (10 mg total) by mouth daily., Disp: 90 tablet, Rfl: 3   Cranberry 500 MG TABS, Take 500 mg by mouth in the morning and at bedtime., Disp: , Rfl:    famotidine (PEPCID) 20 MG tablet, Take 1 tablet (20 mg total) by mouth at bedtime., Disp: 90 tablet, Rfl: 3   fexofenadine (ALLEGRA) 180 MG tablet, Take 180 mg by mouth in the morning., Disp: , Rfl:    fluticasone (FLONASE) 50 MCG/ACT nasal spray, Place 1 spray into both nostrils in the morning and at bedtime., Disp: , Rfl:    LORazepam (ATIVAN) 0.5 MG tablet, Take 1 tablet (0.5  mg total) by mouth 2 (two) times daily as needed. for anxiety, Disp: 30 tablet, Rfl: 0  Observations/Objective: Patient is well-developed, well-nourished in no acute distress.  Resting comfortably at home.  Head is normocephalic, atraumatic.  No labored breathing.  Speech is clear and coherent with logical content.  Patient is alert and oriented at baseline.    Assessment and Plan: 1. COVID-19 (Primary) - nirmatrelvir/ritonavir, renal dosing, (PAXLOVID) 10 x 150 MG & 10 x 100MG  TABS; Take 2 tablets by mouth 2 (two) times daily for 5 days. (Take nirmatrelvir 150 mg one tablet twice daily for 5 days and ritonavir 100 mg one tablet twice daily for 5 days)  Dispense: 20 tablet; Refill: 0  - Continue OTC symptomatic management of choice - Will send OTC vitamins and supplement information through AVS - Paxlovid renal dose prescribed - Patient enrolled in MyChart symptom monitoring - Push fluids - Rest as needed - Discussed  return precautions and when to seek in-person evaluation, sent via AVS as well   Follow Up Instructions: I discussed the assessment and treatment plan with the patient. The patient was provided an opportunity to ask questions and all were answered. The patient agreed with the plan and demonstrated an understanding of the instructions.  A copy of instructions were sent to the patient via MyChart unless otherwise noted below.    The patient was advised to call back or seek an in-person evaluation if the symptoms worsen or if the condition fails to improve as anticipated.    Margaretann Loveless, PA-C

## 2024-01-29 NOTE — Patient Instructions (Signed)
Leana Roe, thank you for joining Margaretann Loveless, PA-C for today's virtual visit.  While this provider is not your primary care provider (PCP), if your PCP is located in our provider database this encounter information will be shared with them immediately following your visit.   A [facility] MyChart account gives you access to today's visit and all your visits, tests, and labs performed at [facility] " click here if you don't have a [facility] MyChart account or go to [email or web address]  Consent: (Patient) Ricquel Foulk Davie provided verbal consent for this virtual visit at the beginning of the encounter.  Current Medications:  Current Outpatient Medications:    nirmatrelvir/ritonavir, renal dosing, (PAXLOVID) 10 x 150 MG & 10 x 100MG  TABS, Take 2 tablets by mouth 2 (two) times daily for 5 days. (Take nirmatrelvir 150 mg one tablet twice daily for 5 days and ritonavir 100 mg one tablet twice daily for 5 days), Disp: 20 tablet, Rfl: 0   acetaminophen (TYLENOL) 500 MG tablet, Take 500 mg by mouth 2 (two) times a day. FOR BACK PAIN, Disp: , Rfl:    albuterol (VENTOLIN HFA) 108 (90 Base) MCG/ACT inhaler, INHALE 2 PUFFS BY MOUTH EVERY 6 HOURS AS NEEDED FOR WHEEZING OR FOR SHORTNESS OF BREATH, Disp: 18 g, Rfl: 3   amoxicillin-clavulanate (AUGMENTIN) 875-125 MG tablet, Take 1 tablet by mouth 2 (two) times daily., Disp: 20 tablet, Rfl: 3   citalopram (CELEXA) 10 MG tablet, Take 1 tablet (10 mg total) by mouth daily., Disp: 90 tablet, Rfl: 3   Cranberry 500 MG TABS, Take 500 mg by mouth in the morning and at bedtime., Disp: , Rfl:    famotidine (PEPCID) 20 MG tablet, Take 1 tablet (20 mg total) by mouth at bedtime., Disp: 90 tablet, Rfl: 3   fexofenadine (ALLEGRA) 180 MG tablet, Take 180 mg by mouth in the morning., Disp: , Rfl:    fluticasone (FLONASE) 50 MCG/ACT nasal spray, Place 1 spray into both nostrils in the morning and at bedtime., Disp: , Rfl:    LORazepam  (ATIVAN) 0.5 MG tablet, Take 1 tablet (0.5 mg total) by mouth 2 (two) times daily as needed. for anxiety, Disp: 30 tablet, Rfl: 0   Medications ordered in this encounter:  Meds ordered this encounter  Medications   nirmatrelvir/ritonavir, renal dosing, (PAXLOVID) 10 x 150 MG & 10 x 100MG  TABS    Sig: Take 2 tablets by mouth 2 (two) times daily for 5 days. (Take nirmatrelvir 150 mg one tablet twice daily for 5 days and ritonavir 100 mg one tablet twice daily for 5 days)    Dispense:  20 tablet    Refill:  0    Supervising Provider:   Merrilee Jansky [phone]     *If you need refills on other medications prior to your next appointment, please contact your pharmacy*  Follow-Up: Call back or seek an in-person evaluation if the symptoms worsen or if the condition fails to improve as anticipated.  [facility] [phone]  Care Instructions: Can take to lessen severity: Vit C 500mg  twice daily Quercertin 250-500mg  twice daily Zinc 75-100mg  daily Melatonin 3-6 mg at bedtime Vit D3 1000-2000 IU daily Aspirin 81 mg daily with food Optional: Famotidine 20mg  daily Also can add tylenol/ibuprofen as needed for fevers and body aches May add Mucinex or Mucinex DM as needed for cough/congestion    Isolation Instructions: You are to isolate at home until you have been fever free for  at least 24 hours without a fever-reducing medication, and symptoms have been steadily improving for 24 hours. At that time,  you can end isolation but need to mask for an additional 5 days.   If you must be around other household members who do not have symptoms, you need to make sure that both you and the family members are masking consistently with a high-quality mask.  If you note any worsening of symptoms despite treatment, please seek an in-person evaluation ASAP. If you note any significant shortness of breath or any chest pain, please seek ER evaluation. Please do not delay  care!   COVID-19: What to Do if You Are Sick If you test positive and are an older adult or someone who is at high risk of getting very sick from COVID-19, treatment may be available. Contact a healthcare provider right away after a positive test to determine if you are eligible, even if your symptoms are mild right now. You can also visit a Test to Treat location and, if eligible, receive a prescription from a provider. Don't delay: Treatment must be started within the first few days to be effective. If you have a fever, cough, or other symptoms, you might have COVID-19. Most people have mild illness and are able to recover at home. If you are sick: Keep track of your symptoms. If you have an emergency warning sign (including trouble breathing), call 911. Steps to help prevent the spread of COVID-19 if you are sick If you are sick with COVID-19 or think you might have COVID-19, follow the steps below to care for yourself and to help protect other people in your home and community. Stay home except to get medical care Stay home. Most people with COVID-19 have mild illness and can recover at home without medical care. Do not leave your home, except to get medical care. Do not visit public areas and do not go to places where you are unable to wear a mask. Take care of yourself. Get rest and stay hydrated. Take over-the-counter medicines, such as acetaminophen, to help you feel better. Stay in touch with your doctor. Call before you get medical care. Be sure to get care if you have trouble breathing, or have any other emergency warning signs, or if you think it is an emergency. Avoid public transportation, ride-sharing, or taxis if possible. Get tested If you have symptoms of COVID-19, get tested. While waiting for test results, stay away from others, including staying apart from those living in your household. Get tested as soon as possible after your symptoms start. Treatments may be available for  people with COVID-19 who are at risk for becoming very sick. Don't delay: Treatment must be started early to be effective--some treatments must begin within 5 days of your first symptoms. Contact your healthcare provider right away if your test result is positive to determine if you are eligible. Self-tests are one of several options for testing for the virus that causes COVID-19 and may be more convenient than laboratory-based tests and point-of-care tests. Ask your healthcare provider or your local health department if you need help interpreting your test results. You can visit your state, tribal, local, and territorial health department's website to look for the latest local information on testing sites. Separate yourself from other people As much as possible, stay in a specific room and away from other people and pets in your home. If possible, you should use a separate bathroom. If you need to be around  other people or animals in or outside of the home, wear a well-fitting mask. Tell your close contacts that they may have been exposed to COVID-19. An infected person can spread COVID-19 starting 48 hours (or 2 days) before the person has any symptoms or tests positive. By letting your close contacts know they may have been exposed to COVID-19, you are helping to protect everyone. See COVID-19 and Animals if you have questions about pets. If you are diagnosed with COVID-19, someone from the health department may call you. Answer the call to slow the spread. Monitor your symptoms Symptoms of COVID-19 include fever, cough, or other symptoms. Follow care instructions from your healthcare provider and local health department. Your local health authorities may give instructions on checking your symptoms and reporting information. When to seek emergency medical attention Look for emergency warning signs* for COVID-19. If someone is showing any of these signs, seek emergency medical care  immediately: Trouble breathing Persistent pain or pressure in the chest New confusion Inability to wake or stay awake Pale, gray, or blue-colored skin, lips, or nail beds, depending on skin tone *This list is not all possible symptoms. Please call your medical provider for any other symptoms that are severe or concerning to you. Call 911 or call ahead to your local emergency facility: Notify the operator that you are seeking care for someone who has or may have COVID-19. Call ahead before visiting your doctor Call ahead. Many medical visits for routine care are being postponed or done by phone or telemedicine. If you have a medical appointment that cannot be postponed, call your doctor's office, and tell them you have or may have COVID-19. This will help the office protect themselves and other patients. If you are sick, wear a well-fitting mask You should wear a mask if you must be around other people or animals, including pets (even at home). Wear a mask with the best fit, protection, and comfort for you. You don't need to wear the mask if you are alone. If you can't put on a mask (because of trouble breathing, for example), cover your coughs and sneezes in some other way. Try to stay at least 6 feet away from other people. This will help protect the people around you. Masks should not be placed on young children under age 70 years, anyone who has trouble breathing, or anyone who is not able to remove the mask without help. Cover your coughs and sneezes Cover your mouth and nose with a tissue when you cough or sneeze. Throw away used tissues in a lined trash can. Immediately wash your hands with soap and water for at least 20 seconds. If soap and water are not available, clean your hands with an alcohol-based hand sanitizer that contains at least 60% alcohol. Clean your hands often Wash your hands often with soap and water for at least 20 seconds. This is especially important after blowing your  nose, coughing, or sneezing; going to the bathroom; and before eating or preparing food. Use hand sanitizer if soap and water are not available. Use an alcohol-based hand sanitizer with at least 60% alcohol, covering all surfaces of your hands and rubbing them together until they feel dry. Soap and water are the best option, especially if hands are visibly dirty. Avoid touching your eyes, nose, and mouth with unwashed hands. Handwashing Tips Avoid sharing personal household items Do not share dishes, drinking glasses, cups, eating utensils, towels, or bedding with other people in your home. Wash these  items thoroughly after using them with soap and water or put in the dishwasher. Clean surfaces in your home regularly Clean and disinfect high-touch surfaces (for example, doorknobs, tables, handles, light switches, and countertops) in your "sick room" and bathroom. In shared spaces, you should clean and disinfect surfaces and items after each use by the person who is ill. If you are sick and cannot clean, a caregiver or other person should only clean and disinfect the area around you (such as your bedroom and bathroom) on an as needed basis. Your caregiver/other person should wait as long as possible (at least several hours) and wear a mask before entering, cleaning, and disinfecting shared spaces that you use. Clean and disinfect areas that may have blood, stool, or body fluids on them. Use household cleaners and disinfectants. Clean visible dirty surfaces with household cleaners containing soap or detergent. Then, use a household disinfectant. Use a product from [employer] List N: Disinfectants for Coronavirus (COVID-19). Be sure to follow the instructions on the label to ensure safe and effective use of the product. Many products recommend keeping the surface wet with a disinfectant for a certain period of time (look at "contact time" on the product label). You may also need to wear personal protective  equipment, such as gloves, depending on the directions on the product label. Immediately after disinfecting, wash your hands with soap and water for 20 seconds. For completed guidance on cleaning and disinfecting your home, visit Complete Disinfection Guidance. Take steps to improve ventilation at home Improve ventilation (air flow) at home to help prevent from spreading COVID-19 to other people in your household. Clear out COVID-19 virus particles in the air by opening windows, using air filters, and turning on fans in your home. Use this interactive tool to learn how to improve air flow in your home. When you can be around others after being sick with COVID-19 Deciding when you can be around others is different for different situations. Find out when you can safely end home isolation. For any additional questions about your care, contact your healthcare provider or state or local health department. 03/19/2021 Content source: [facility] for Immunization and Respiratory Diseases ([facility]), Division of Viral Diseases This information is not intended to replace advice given to you by your health care provider. Make sure you discuss any questions you have with your health care provider. Document Revised: 05/02/2021 Document Reviewed: 05/02/2021 Elsevier Patient Education  2022 [employer].     If you have been instructed to have an in-person evaluation today at a local Urgent Care facility, please use the link below. It will take you to a list of all of our available [facility], including address, phone number and hours of operation. Please do not delay care.  [facility]  If you or a family member do not have a primary care provider, use the link below to schedule a visit and establish care. When you choose a [facility] primary care physician or advanced practice provider, you gain a long-term partner in health. Find a Primary Care Provider  Learn more about  [facility]'s in-office and virtual care options: [facility] - Get Care Now

## 2024-03-29 ENCOUNTER — Encounter: Payer: Self-pay | Admitting: Internal Medicine

## 2024-04-10 ENCOUNTER — Emergency Department (HOSPITAL_COMMUNITY): Admission: EM | Admit: 2024-04-10 | Discharge: 2024-04-11 | Disposition: A | Discharge status: Home / Self Care

## 2024-04-10 ENCOUNTER — Emergency Department (HOSPITAL_COMMUNITY)

## 2024-04-10 ENCOUNTER — Encounter (HOSPITAL_COMMUNITY): Payer: Self-pay | Admitting: Emergency Medicine

## 2024-04-10 DIAGNOSIS — I517 Cardiomegaly: Secondary | ICD-10-CM | POA: Diagnosis not present

## 2024-04-10 DIAGNOSIS — S199XXA Unspecified injury of neck, initial encounter: Secondary | ICD-10-CM | POA: Diagnosis not present

## 2024-04-10 DIAGNOSIS — W08XXXA Fall from other furniture, initial encounter: Secondary | ICD-10-CM | POA: Diagnosis not present

## 2024-04-10 DIAGNOSIS — M160 Bilateral primary osteoarthritis of hip: Secondary | ICD-10-CM | POA: Diagnosis not present

## 2024-04-10 DIAGNOSIS — R220 Localized swelling, mass and lump, head: Secondary | ICD-10-CM | POA: Diagnosis not present

## 2024-04-10 DIAGNOSIS — J439 Emphysema, unspecified: Secondary | ICD-10-CM | POA: Diagnosis not present

## 2024-04-10 DIAGNOSIS — S0240CA Maxillary fracture, right side, initial encounter for closed fracture: Secondary | ICD-10-CM | POA: Diagnosis not present

## 2024-04-10 DIAGNOSIS — S0231XA Fracture of orbital floor, right side, initial encounter for closed fracture: Secondary | ICD-10-CM | POA: Diagnosis not present

## 2024-04-10 DIAGNOSIS — I10 Essential (primary) hypertension: Secondary | ICD-10-CM | POA: Diagnosis not present

## 2024-04-10 DIAGNOSIS — M50321 Other cervical disc degeneration at C4-C5 level: Secondary | ICD-10-CM | POA: Diagnosis not present

## 2024-04-10 DIAGNOSIS — I77819 Aortic ectasia, unspecified site: Secondary | ICD-10-CM | POA: Diagnosis not present

## 2024-04-10 DIAGNOSIS — Z043 Encounter for examination and observation following other accident: Secondary | ICD-10-CM | POA: Diagnosis not present

## 2024-04-10 DIAGNOSIS — S02401A Maxillary fracture, unspecified, initial encounter for closed fracture: Secondary | ICD-10-CM | POA: Diagnosis not present

## 2024-04-10 DIAGNOSIS — R918 Other nonspecific abnormal finding of lung field: Secondary | ICD-10-CM | POA: Diagnosis not present

## 2024-04-10 DIAGNOSIS — M47816 Spondylosis without myelopathy or radiculopathy, lumbar region: Secondary | ICD-10-CM | POA: Diagnosis not present

## 2024-04-10 DIAGNOSIS — S0993XA Unspecified injury of face, initial encounter: Secondary | ICD-10-CM | POA: Diagnosis present

## 2024-04-10 DIAGNOSIS — R0902 Hypoxemia: Secondary | ICD-10-CM | POA: Diagnosis not present

## 2024-04-10 DIAGNOSIS — Z981 Arthrodesis status: Secondary | ICD-10-CM | POA: Diagnosis not present

## 2024-04-10 DIAGNOSIS — R404 Transient alteration of awareness: Secondary | ICD-10-CM | POA: Diagnosis not present

## 2024-04-10 LAB — CBC
HCT: 32.6 % — ABNORMAL LOW (ref 36.0–46.0)
Hemoglobin: 10.5 g/dL — ABNORMAL LOW (ref 12.0–15.0)
MCH: 29.5 pg (ref 26.0–34.0)
MCHC: 32.2 g/dL (ref 30.0–36.0)
MCV: 91.6 fL (ref 80.0–100.0)
Platelets: 343 10*3/uL (ref 150–400)
RBC: 3.56 MIL/uL — ABNORMAL LOW (ref 3.87–5.11)
RDW: 13.1 % (ref 11.5–15.5)
WBC: 10.4 10*3/uL (ref 4.0–10.5)
nRBC: 0 % (ref 0.0–0.2)

## 2024-04-10 LAB — BASIC METABOLIC PANEL WITH GFR
Anion gap: 12 (ref 5–15)
BUN: 15 mg/dL (ref 8–23)
CO2: 27 mmol/L (ref 22–32)
Calcium: 8.9 mg/dL (ref 8.9–10.3)
Chloride: 97 mmol/L — ABNORMAL LOW (ref 98–111)
Creatinine, Ser: 0.53 mg/dL (ref 0.44–1.00)
GFR, Estimated: >60 mL/min (ref >60)
Glucose, Bld: 116 mg/dL — ABNORMAL HIGH (ref 70–99)
Potassium: 3.5 mmol/L (ref 3.5–5.1)
Sodium: 136 mmol/L (ref 135–145)

## 2024-04-10 NOTE — ED Notes (Signed)
 daughter [name] Barker updated

## 2024-04-10 NOTE — ED Provider Notes (Signed)
 [place] EMERGENCY DEPARTMENT AT [facility] Provider Note   CSN: [id number] Arrival date & time: 04/10/24  1859     History  Chief Complaint  Patient presents with   [name] Barker    SHELITHA MAGLEY is a 89 y.o. female.  This is a 89 year old female with DNR presented to the emergency department after a mechanical fall.  Fell from recliner per EMS report.  They note that she is typically GCS 13 and largely nonverbal.  No blood thinners.  Unable to elicit history from patient unclear if secondary to trauma versus reported hard of hearing.  She is however alert and moving all extremities.   Fall       Home Medications Prior to Admission medications   Medication Sig Start Date End Date Taking? Authorizing Provider  acetaminophen (TYLENOL) 500 MG tablet Take 500 mg by mouth 2 (two) times a day. FOR BACK PAIN    [provider]  albuterol (VENTOLIN HFA) 108 (90 Base) MCG/ACT inhaler INHALE 2 PUFFS BY MOUTH EVERY 6 HOURS AS NEEDED FOR WHEEZING OR FOR SHORTNESS OF BREATH 01/25/24   Myrlene Broker, MD  amoxicillin-clavulanate (AUGMENTIN) 875-125 MG tablet Take 1 tablet by mouth 2 (two) times daily. 01/25/24   Myrlene Broker, MD  citalopram (CELEXA) 10 MG tablet Take 1 tablet (10 mg total) by mouth daily. 12/28/23   Myrlene Broker, MD  Cranberry 500 MG TABS Take 500 mg by mouth in the morning and at bedtime.    [provider]  famotidine (PEPCID) 20 MG tablet Take 1 tablet (20 mg total) by mouth at bedtime. 01/25/24   Myrlene Broker, MD  fexofenadine (ALLEGRA) 180 MG tablet Take 180 mg by mouth in the morning.    [provider]  fluticasone (FLONASE) 50 MCG/ACT nasal spray Place 1 spray into both nostrils in the morning and at bedtime.    [provider]  LORazepam (ATIVAN) 0.5 MG tablet Take 1 tablet (0.5 mg total) by mouth 2 (two) times daily as needed. for anxiety 01/25/24   Myrlene Broker, MD  pantoprazole  (PROTONIX) 40 MG tablet Take 1 tablet (40 mg total) by mouth daily. Patient not taking: Reported on 02/05/2021 01/30/21 02/05/21  Leroy Sea, MD      Allergies    Morphine and codeine, Flexeril [cyclobenzaprine], Methenamine mandelate, Penicillins, Promethazine hcl, Statins, Ativan [lorazepam], and Morphine sulfate    Review of Systems   Review of Systems  Physical Exam Updated Vital Signs BP (!) 170/85   Pulse 70   Temp (!) 97.5 F (36.4 C) (Axillary)   Resp (!) 21   Ht 5\' 1"  (1.549 m)   Wt 56 kg   SpO2 96%   BMI 23.33 kg/m  Physical Exam Vitals and nursing note reviewed.  Constitutional:      General: She is not in acute distress. HENT:     Head: Normocephalic.     Comments: Bruising and ecchymosis around right eye, some dried blood to her nare as well as to her left temple.  Pupils equal round reactive to light.  No septal hematoma.   Neck:     Comments: Trachea midline Pulmonary:     Effort: Pulmonary effort is normal.     Breath sounds: Normal breath sounds.  Abdominal:     General: Abdomen is flat. There is no distension.     Palpations: Abdomen is soft.     Tenderness: There is no abdominal tenderness. There is  no guarding or rebound.  Musculoskeletal:     Comments: No overt deformities.  No bony tenderness.  Chest wall stable nontender.  Pelvis stable nontender.  Skin:    General: Skin is warm and dry.     Capillary Refill: Capillary refill takes less than 2 seconds.  Neurological:     Mental Status: She is alert. Mental status is at baseline.     Comments: Patient alert, but not following commands. Moves all extremities spontaneously in coordinated fashion.   Psychiatric:     Comments: Not possible to assess.      ED Results / Procedures / Treatments   Labs (all labs ordered are listed, but only abnormal results are displayed) Labs Reviewed  CBC - Abnormal; Notable for the following components:      Result Value   RBC 3.56 (*)    Hemoglobin 10.5 (*)     HCT 32.6 (*)    All other components within normal limits  BASIC METABOLIC PANEL WITH GFR - Abnormal; Notable for the following components:   Chloride 97 (*)    Glucose, Bld 116 (*)    All other components within normal limits    EKG None  Radiology CT Maxillofacial Wo Contrast Result Date: 04/10/2024 CLINICAL DATA:  Facial trauma, blunt.  Fall. EXAM: CT MAXILLOFACIAL WITHOUT CONTRAST TECHNIQUE: Multidetector CT imaging of the maxillofacial structures was performed. Multiplanar CT image reconstructions were also generated. RADIATION DOSE REDUCTION: This exam was performed according to the departmental dose-optimization program which includes automated exposure control, adjustment of the mA and/or kV according to patient size and/or use of iterative reconstruction technique. COMPARISON:  None Available. FINDINGS: Osseous: Fracture noted through the anterior and posterior walls of the right maxillary sinus. Minimal depression of the anterior wall. Zygomatic arches and mandible intact. Orbits: Small amount of emphysema within the floor of the right orbit compatible with orbital floor fracture, nondisplaced. Sinuses: Blood within the right maxillary sinus. Soft tissues: Soft tissue swelling over the right face and orbit. Limited intracranial: See head CT report IMPRESSION: Fractures through the anterior and posterior walls of the right maxillary sinus. Nondisplaced fracture through the floor of the right orbit. Electronically Signed   By: Janeece Mechanic M.D.   On: 04/10/2024 20:13   CT Cervical Spine Wo Contrast Result Date: 04/10/2024 CLINICAL DATA:  Neck trauma (Age >= 65y).  Fall. EXAM: CT CERVICAL SPINE WITHOUT CONTRAST TECHNIQUE: Multidetector CT imaging of the cervical spine was performed without intravenous contrast. Multiplanar CT image reconstructions were also generated. RADIATION DOSE REDUCTION: This exam was performed according to the departmental dose-optimization program which includes  automated exposure control, adjustment of the mA and/or kV according to patient size and/or use of iterative reconstruction technique. COMPARISON:  None Available. FINDINGS: Alignment: Normal Skull base and vertebrae: No acute fracture. No primary bone lesion or focal pathologic process. Soft tissues and spinal canal: No prevertebral fluid or swelling. No visible canal hematoma. Disc levels: Degenerative disc disease at C4-5 through C6-7. Moderate bilateral degenerative facet disease, left greater than right. Upper chest: No acute findings Other: None IMPRESSION: Degenerative disc and facet disease. No acute bony abnormality. Electronically Signed   By: Janeece Mechanic M.D.   On: 04/10/2024 20:11   CT Head Wo Contrast Result Date: 04/10/2024 CLINICAL DATA:  Head trauma, minor (Age >= 65y).  Fall EXAM: CT HEAD WITHOUT CONTRAST TECHNIQUE: Contiguous axial images were obtained from the base of the skull through the vertex without intravenous contrast. RADIATION DOSE REDUCTION: This  exam was performed according to the departmental dose-optimization program which includes automated exposure control, adjustment of the mA and/or kV according to patient size and/or use of iterative reconstruction technique. COMPARISON:  01/26/2021 FINDINGS: Brain: Old bilateral basal ganglia lacunar infarcts. There is atrophy and chronic small vessel disease changes. No acute intracranial abnormality. Specifically, no hemorrhage, hydrocephalus, mass lesion, acute infarction, or significant intracranial injury. Vascular: No hyperdense vessel or unexpected calcification. Skull: No acute calvarial abnormality. Sinuses/Orbits: Soft tissue swelling over the right orbit and face. Fractures noted through the anterior and posterior walls of the right maxillary sinus. Probable blood in the right maxillary sinus. Other: None IMPRESSION: Atrophy, chronic microvascular disease. No acute intracranial abnormality. Fracture through the anterior and  posterior walls of the right maxillary sinus. See further discussion on face CT. Electronically Signed   By: Charlett Nose M.D.   On: 04/10/2024 20:07   DG Pelvis Portable Result Date: 04/10/2024 CLINICAL DATA:  Fall. EXAM: PORTABLE PELVIS 1-2 VIEWS COMPARISON:  01/26/2021. FINDINGS: There is no evidence of acute pelvic fracture or diastasis. There is no dislocation at the hips bilaterally. Fixation hardware is present in the bilateral proximal femurs without evidence of hardware loosening. There is partial visualization of lumbar spinal fusion hardware. Degenerative changes are noted in the lower lumbar spine and bilateral hips, greater on the right than on the left. Aortic atherosclerosis and vascular calcifications are noted. IMPRESSION: No acute fracture or dislocation. Electronically Signed   By: Thornell Sartorius M.D.   On: 04/10/2024 19:51   DG Chest Portable 1 View Result Date: 04/10/2024 CLINICAL DATA:  Fall. EXAM: PORTABLE CHEST 1 VIEW COMPARISON:  Chest x-ray 01/26/2021. FINDINGS: Heart is enlarged. The aorta is ectatic and heavily calcified. There is some scattered interstitial patchy opacities in the right mid and lower lung. There is no pleural effusion or pneumothorax. No acute fractures are seen. Lumbar fusion hardware is partially visualized. IMPRESSION: 1. Scattered interstitial patchy opacities in the right mid and lower lung, which may represent atelectasis or infection. 2. Cardiomegaly. Electronically Signed   By: Darliss Cheney M.D.   On: 04/10/2024 19:51    Procedures Procedures    Medications Ordered in ED Medications - No data to display  ED Course/ Medical Decision Making/ A&P Clinical Course as of 04/10/24 2044  Sun Apr 10, 2024  1939 DG Chest Portable 1 View No obvious pneumonia or pneumothorax on my independent interpretation. [TY]  2023 CT Maxillofacial Wo Contrast IMPRESSION: Fractures through the anterior and posterior walls of the right maxillary sinus. Nondisplaced  fracture through the floor of the right orbit.   [TY]  2037 Updated daughter on results.  It does sound like mother is at neurologic baseline per daughter's reports.  Mechanical fall.  She does have Augmentin tablets from PCP for prophylactic treatment of urinary tract infections.  Discussed taking them for a 5-day course given her maxillary sinus fracture.  CT scan did show orbital floor fracture.  Not displaying signs of entrapment on exam.  Small laceration to right outer eyebrow approximated with Steri-Strips. No laceration to left temple or scalp after cleaning. Patient stable for discharge at this time. [TY]    Clinical Course User Index [TY] Coral Spikes, DO                                 Medical Decision Making This is a 89 year old female presented after a mechanical fall.  She  is a DNR with paperwork.  EMS reported stable GCS and round.  Stable vitals.  Per chart review does not appear to be taking blood thinners.  Has obvious facial trauma, some dried blood in left temple/hair.  No overt laceration, but does have some dried blood to nare.  Unclear if blood secondary to epistaxis versus laceration.  Will clean and evaluate.  Given patient's age will get CT head, CT cervical spine, CT facial maxillary, chest x-ray, pelvis x-ray.  Will get basic screening labs as well. Will attempt to contact family. See ED course for further MDM and disposition.  Amount and/or Complexity of Data Reviewed Labs: ordered. Radiology: ordered. Decision-making details documented in ED Course.          Final Clinical Impression(s) / ED Diagnoses Final diagnoses:  Closed fracture of right orbital floor, initial encounter [facility])  Closed fracture of right maxillary sinus, initial encounter [facility])    Rx / DC Orders ED Discharge Orders          Ordered    Ambulatory referral to ENT        04/10/24 2041              Rolinda Climes, DO 04/10/24 2044

## 2024-04-10 NOTE — Discharge Instructions (Addendum)
 As discussed take your Augmentin tablets 2 times daily for the next 7 days.  You may take over-the-counter medication such as Tylenol for pain.  Please avoid blowing your nose until you are seen by the ear nose throat doctor.  Return if develop fevers, chills, altered mental status, strokelike symptoms, facial droop, unilateral weakness, seizure, passout, inability to eat or drink due to nausea vomiting or any new or worsening symptoms that are concerning to you.  The laceration should heal on its own, return if redness spreads, or drains pus.  We have referred you to the ear nose throat doctor.  They should call to schedule an appointment.  However if they do not call you within the next 48 hours, please call and schedule an appointment yourself with Dr. Virgia Griffins.

## 2024-04-10 NOTE — ED Notes (Signed)
 Daughter Winston Hawking [phone] would like an update asap

## 2024-04-10 NOTE — ED Triage Notes (Signed)
 Patient BIB EMS from home. Patient had a mechanical fall, no thinners, obvious head injury, GCS 13 (verbal/orientation), baseline is GCS 13 & non-verbal, HOH. HTN 180/100

## 2024-04-10 NOTE — ED Notes (Signed)
 Patient transported to CT

## 2024-04-11 DIAGNOSIS — I10 Essential (primary) hypertension: Secondary | ICD-10-CM | POA: Diagnosis not present

## 2024-04-11 DIAGNOSIS — Z7401 Bed confinement status: Secondary | ICD-10-CM | POA: Diagnosis not present

## 2024-06-07 ENCOUNTER — Ambulatory Visit (INDEPENDENT_AMBULATORY_CARE_PROVIDER_SITE_OTHER)

## 2024-06-07 VITALS — Wt 123.0 lb

## 2024-06-07 DIAGNOSIS — Z0000 Encounter for general adult medical examination without abnormal findings: Secondary | ICD-10-CM

## 2024-06-07 NOTE — Progress Notes (Signed)
 Subjective:   [name] Barker is a 89 y.o. who presents for a Medicare Wellness preventive visit.  As a reminder, Annual Wellness Visits don't include a physical exam, and some assessments may be limited, especially if this visit is performed virtually. We may recommend an in-person follow-up visit with your provider if needed.  Visit Complete: Virtual I connected with  [name] Barker on 06/07/24 by a audio enabled telemedicine application and verified that I am speaking with the correct person using two identifiers.  Patient Location: Home  Provider Location: Home Office  I discussed the limitations of evaluation and management by telemedicine. The patient expressed understanding and agreed to proceed.  Vital Signs: Because this visit was a virtual/telehealth visit, some criteria may be missing or patient reported. Any vitals not documented were not able to be obtained and vitals that have been documented are patient reported.  VideoDeclined- This patient declined Librarian, academic. Therefore the visit was completed with audio only.  Persons Participating in Visit: Patient's daughter and patient was present during visit.  AWV Barker: No: Patient Medicare AWV Barker was not completed prior to this visit.  Cardiac Risk Factors include: advanced age (>11men, >55 women);dyslipidemia;Other (see comment), Risk factor comments: CAD  Did not did the PHQ-9 today.  Patient's daughter did visit today. Patient unable to answer due to age/condition      Objective:     Today's Vitals   06/07/24 1302  Weight: 123 lb (55.8 kg)   Body mass index is 23.24 kg/m.     06/07/2024    1:14 PM 04/10/2024    7:13 PM 10/27/2023    3:26 PM 10/24/2022   11:33 AM 03/31/2019    6:00 PM 09/13/2018   10:44 AM 08/04/2017    2:38 PM  Advanced Directives  Does Patient Have a Medical Advance Directive? Yes Yes Yes Yes -- Yes No  Type of Advance Directive  Healthcare Power of [place];Living will Out of facility DNR (pink MOST or yellow form) Healthcare Power of [place];Living will Healthcare Power of [place];Living will  Healthcare Power of Attorney   Does patient want to make changes to medical advance directive? No - Patient declined  No - Patient declined No - Patient declined  No - Patient declined   Copy of Healthcare Power of Attorney in Chart? Yes - validated most recent copy scanned in chart (See row information)  Yes - validated most recent copy scanned in chart (See row information) Yes - validated most recent copy scanned in chart (See row information)  No - copy requested   Would patient like information on creating a medical advance directive?       Yes (ED - Information included in AVS)    Current Medications (verified) Outpatient Encounter Medications as of 06/07/2024  Medication Sig   acetaminophen  (TYLENOL ) 500 MG tablet Take 500 mg by mouth 2 (two) times a day. FOR BACK PAIN   albuterol  (VENTOLIN  HFA) 108 (90 Base) MCG/ACT inhaler INHALE 2 PUFFS BY MOUTH EVERY 6 HOURS AS NEEDED FOR WHEEZING OR FOR SHORTNESS OF BREATH   amoxicillin -clavulanate (AUGMENTIN ) 875-125 MG tablet Take 1 tablet by mouth 2 (two) times daily.   citalopram  (CELEXA ) 10 MG tablet Take 1 tablet (10 mg total) by mouth daily.   Cranberry 500 MG TABS Take 500 mg by mouth in the morning and at bedtime.   famotidine  (PEPCID ) 20 MG tablet Take 1 tablet (20 mg total) by mouth at bedtime.   fexofenadine (  ALLEGRA) 180 MG tablet Take 180 mg by mouth in the morning.   fluticasone  (FLONASE ) 50 MCG/ACT nasal spray Place 1 spray into both nostrils in the morning and at bedtime.   LORazepam  (ATIVAN ) 0.5 MG tablet Take 1 tablet (0.5 mg total) by mouth 2 (two) times daily as needed. for anxiety   [DISCONTINUED] pantoprazole  (PROTONIX ) 40 MG tablet Take 1 tablet (40 mg total) by mouth daily. (Patient not taking: Reported on 02/05/2021)   No facility-administered encounter  medications on file as of 06/07/2024.    Allergies (verified) Morphine  and codeine, Flexeril  [cyclobenzaprine ], Methenamine mandelate, Penicillins, Promethazine hcl, Statins, Ativan  [lorazepam ], and Morphine  sulfate   History: Past Medical History:  Diagnosis Date   Allergic rhinitis    ANEMIA-NOS 11/17/2006   Colon cancer (HCC) 08/19/2007   1994 T2, 2008 T3, N1   Colon cancer (HCC)    COVID-19    DIVERTICULOSIS-COLON 05/30/2010   DNR (do not resuscitate)    Dyslipidemia    Femur fracture, left (HCC) 09/13/2018   CLOSED   GERD 11/17/2006   Hemorrhoids    History of CVA (cerebrovascular accident)    HYPERLIPIDEMIA 11/17/2006   LOW BACK PAIN, CHRONIC 02/04/2010   Osteopenia 11/17/2006   Small bowel obstruction (HCC)    TIA (transient ischemic attack)    Past Surgical History:  Procedure Laterality Date   ABDOMINAL HYSTERECTOMY     APPENDECTOMY     BACK SURGERY  2007   BELPHAROPTOSIS REPAIR     CATARACT EXTRACTION     HEMICOLECTOMY  1994   Right   INTRAMEDULLARY (IM) NAIL INTERTROCHANTERIC Right 01/27/2021   Procedure: INTRAMEDULLARY (IM) NAIL INTERTROCHANTRIC;  Surgeon: Claiborne Crew, MD;  Location: [facility] OR;  Service: Orthopedics;  Laterality: Right;   JOINT REPLACEMENT     KNEE ARTHROSCOPY     ORIF FEMUR FRACTURE Left 09/13/2018   Procedure: OPEN REDUCTION INTERNAL FIXATION (ORIF) DISTAL FEMUR FRACTURE;  Surgeon: Laneta Pintos, MD;  Location: [facility] OR;  Service: Orthopedics;  Laterality: Left;   PARTIAL COLECTOMY  2008   ROTATOR CUFF REPAIR     SHOULDER SURGERY     VAGINAL PROLAPSE REPAIR     Family History  Problem Relation Age of Onset   Lung cancer Sister    Colon cancer Sister 20   Kidney cancer Sister    Social History   Socioeconomic History   Marital status: Married    Spouse name: [name] Barker   Number of children: 6   Years of education: 11   Highest education level: Not on file  Occupational History   Occupation: retired  Tobacco Use   Smoking status: Never    Smokeless tobacco: Never  Vaping Use   Vaping status: Never Used  Substance and Sexual Activity   Alcohol use: Not Currently   Drug use: Never   Sexual activity: Not on file    Comment: not asked  Other Topics Concern   Not on file  Social History Narrative   ** Merged History Encounter ** 04/25/19 Mrs. 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      2024-daughter stays with parent-care giver   Social Drivers of Health   Financial Resource Strain: Low Risk  (06/07/2024)   Overall Financial Resource Strain (CARDIA)  Difficulty of Paying Living Expenses: Not hard at all  Food Insecurity: No Food Insecurity (06/07/2024)   Hunger Vital Sign    Worried About Running Out of Food in the Last Year: Never true    Ran Out of Food in the Last Year: Never true  Transportation Needs: No Transportation Needs (06/07/2024)   PRAPARE - Administrator, Civil Service (Medical): No    Lack of Transportation (Non-Medical): No  Physical Activity: Inactive (06/07/2024)   Exercise Vital Sign    Days of Exercise per Week: 0 days    Minutes of Exercise per Session: 0 min  Stress: Patient Unable To Answer (06/07/2024)   [name] Barker    Feeling of Stress : Patient unable to answer  Social Connections: Moderately Isolated (06/07/2024)   Social Connection and Isolation Panel [NHANES]    Frequency of Communication with Friends and Family: Never    Frequency of Social Gatherings with Friends and Family: More than three times a week    Attends Religious Services: Never    Database administrator or Organizations: No    Attends Engineer, structural: Never    Marital Status: Married    Tobacco Counseling Counseling given:  Not Answered    Clinical Intake:  Pre-visit preparation completed: Yes  Pain : No/denies pain     Nutritional Risks: None Diabetes: No  Lab Results  Component Value Date   HGBA1C 5.3 04/01/2019   HGBA1C 5.8 (H) 04/04/2012     How often do you need to have someone help you when you read instructions, pamphlets, or other written materials from your doctor or pharmacy?: 5 - Always (Daughter helps (caregiver))  Interpreter Needed?: No  Comments: Patient's daughter, [name] Barker) is answering questions for mom today.  POA Information entered by :: Tricia Pledger, RMA   Activities of Daily Living     06/07/2024    1:04 PM 10/27/2023    3:21 PM  In your present state of health, do you have any difficulty performing the following activities:  Hearing? 1 1  Comment weares hearing aides wears hearing aides  Vision? 0 0  Difficulty concentrating or making decisions? 1 1  Walking or climbing stairs? 0 1  Dressing or bathing? 1 1  Comment Daughter is caregiver Daught helps her  Doing errands, shopping? 0 1  Comment daughter drives her Daughter drives her  Preparing Food and eating ? Y Y  Comment Daughter is caregiver   Using the Toilet? Y Y  In the past six months, have you accidently leaked urine? Y Y  Do you have problems with loss of bowel control? Y Y  Managing your Medications? Y Y  Managing your Finances? [name] Barker  Housekeeping or managing your Housekeeping? [name] Barker    Patient Care Team: Adelia Homestead, MD as PCP - General (Internal Medicine) Sumner Ends, MD as Consulting Physician (Oncology) Asencion Blacksmith, MD (Inactive) as Consulting Physician (Gastroenterology) Adelia Homestead, MD (Internal Medicine) Szabat, Tino Foreman, [facility] (Inactive) (Pharmacist)  I have updated your Care Teams any recent Medical Services you may have received from other providers in the past year.     Assessment:    This is a routine wellness examination for Mekaela.  Hearing/Vision  screen Hearing Screening - Comments:: weares hearing aides Vision Screening - Comments:: Wears eyeglasses   Goals Addressed   None    Depression Screen Patient's daughter did visit today. Patient  unable to answer due to age/condition     06/07/2024    1:29 PM 10/24/2022   11:32 AM 11/25/2021    3:12 PM 07/27/2020    7:26 AM 08/04/2017    2:39 PM 11/02/2015    1:51 PM 09/26/2013    9:41 AM  PHQ 2/9 Scores  PHQ - 2 Score  0 0 0 1 0 0  PHQ- 9 Score   0  2    Exception Documentation Other- indicate reason in comment box        Not completed Patient's daughter did visit today.  Patient unable to answer due to age/condition          Fall Risk     06/07/2024    1:15 PM 10/27/2023    3:26 PM 10/24/2022   11:29 AM 11/25/2021    3:11 PM 07/27/2020    7:26 AM  Fall Risk   Falls in the past year? 1 0 1 0 0  Number falls in past yr: 0 0 1 0   Injury with Fall? 1 0 1 0   Comment Closed intertrochanteric fracture of right femur      Risk for fall due to : Impaired balance/gait No Fall Risks History of fall(s);Impaired balance/gait;Orthopedic patient    Follow up Falls evaluation completed;Falls prevention discussed Falls prevention discussed;Falls evaluation completed Education provided;Falls prevention discussed      MEDICARE RISK AT HOME:  Medicare Risk at Home Any stairs in or around the home?: No If so, are there any without handrails?: No Home free of loose throw rugs in walkways, pet beds, electrical cords, etc?: Yes Adequate lighting in your home to reduce risk of falls?: Yes Life alert?: No Use of a cane, walker or w/c?: Yes Grab bars in the bathroom?: Yes Shower chair or bench in shower?: Yes Elevated toilet seat or a handicapped toilet?: Yes  TIMED UP AND GO:  Was the test performed?  No  Cognitive Function: Impaired: Patient has current diagnosis of cognitive impairment.    10/13/2017    2:00 PM 08/04/2017    2:11 PM  MMSE - Mini Mental State Exam  Orientation to time  2 2  Orientation to Place 4 3  Registration 3 3  Attention/ Calculation 4 5  Recall 1 0  Language- name 2 objects 2 2  Language- repeat 1 1  Language- follow 3 step command 2 3  Language- read & follow direction 1 1  Write a sentence 1 1  Copy design 0 1  Total score 21 22        10/24/2022   11:34 AM  6CIT Screen  What Year? 4 points  What month? 3 points  Count back from 20 2 points  Months in reverse 2 points  Repeat phrase 4 points    Immunizations Immunization History  Administered Date(s) Administered   Influenza Split 09/29/2011   Influenza Whole 10/03/2008, 09/28/2012   Influenza, High Dose Seasonal PF 09/14/2018   Influenza,inj,Quad PF,6+ Mos 09/12/2013   Influenza,trivalent, recombinat, inj, PF 09/26/2013   Influenza-Unspecified 09/28/2014, 10/13/2015, 09/25/2017, 09/14/2018, 09/24/2019   PFIZER(Purple Top)SARS-COV-2 Vaccination 02/26/2020, 03/21/2020, 05/17/2021   Pneumococcal Conjugate-13 06/07/2015   Pneumococcal Polysaccharide-23 10/29/2006   Td 12/29/1994   Tdap 07/18/2011   Zoster, Live 08/25/2011    Screening Tests Health Maintenance  Topic Date Due   Zoster Vaccines- Shingrix (1 of 2) 05/14/1940   DEXA SCAN  Never done   DTaP/Tdap/Td (3 - Td or Tdap) 07/17/2021  COVID-19 Vaccine (4 - 2024-25 season) 08/30/2023   INFLUENZA VACCINE  07/29/2024   Medicare Annual Wellness (AWV)  10/26/2024   Pneumonia Vaccine 19+ Years old  Completed   HPV VACCINES  Aged Out   Meningococcal B Vaccine  Aged Out    Health Maintenance  Health Maintenance Due  Topic Date Due   Zoster Vaccines- Shingrix (1 of 2) 05/14/1940   DEXA SCAN  Never done   DTaP/Tdap/Td (3 - Td or Tdap) 07/17/2021   COVID-19 Vaccine (4 - 2024-25 season) 08/30/2023   Health Maintenance Items Addressed: See Nurse Notes at the end of this note  Additional Screening:  Vision Screening: Recommended annual ophthalmology exams for early detection of glaucoma and other disorders of the  eye. Would you like a referral to an eye doctor? No    Dental Screening: Recommended annual dental exams for proper oral hygiene  Community Resource Referral / Chronic Care Management: CRR required this visit?  No   CCM required this visit?  No   Plan:    I have personally reviewed and noted the following in the patient's chart:   Medical and social history Use of alcohol, tobacco or illicit drugs  Current medications and supplements including opioid prescriptions. Patient is not currently taking opioid prescriptions. Functional ability and status Nutritional status Physical activity Advanced directives List of other physicians Hospitalizations, surgeries, and ER visits in previous 12 months Vitals Screenings to include cognitive, depression, and falls Referrals and appointments  In addition, I have reviewed and discussed with patient certain preventive protocols, quality metrics, and best practice recommendations. A written personalized care plan for preventive services as well as general preventive health recommendations were provided to patient.   Frayda Egley L Ka Flammer, CMA   06/07/2024   After Visit Summary: (MyChart) Due to this being a telephonic visit, the after visit summary with patients personalized plan was offered to patient via MyChart   Notes: Patient's daughter did visit today, as she is patient POA and caregiver.

## 2024-06-07 NOTE — Patient Instructions (Signed)
 [name] Barker , Thank you for taking time out of your busy schedule to complete your Annual Wellness Visit with me. I enjoyed our conversation and look forward to speaking with you again next year. I, as well as your care team,  appreciate your ongoing commitment to your health goals. Please review the following plan we discussed and let me know if I can assist you in the future. Your Game plan/ To Do List    Follow up Visits: Next Medicare AWV with our clinical staff: 06/09/2025.   Have you seen your provider in the last 6 months (3 months if uncontrolled diabetes)? No Next Office Visit with your provider: N/A  Clinician Recommendations:  Aim for 30 minutes of exercise or brisk walking, 6-8 glasses of water, and 5 servings of fruits and vegetables each day. Keep up the good work.      This is a list of the screening recommended for you and due dates:  Health Maintenance  Topic Date Due   Zoster (Shingles) Vaccine (1 of 2) [birth year]14/1940   DEXA scan (bone density measurement)  Never done   DTaP/Tdap/Td vaccine (3 - Td or Tdap) 07/17/2021   COVID-19 Vaccine (4 - 2024-25 season) 08/30/2023   Flu Shot  07/29/2024   Medicare Annual Wellness Visit  10/26/2024   Pneumonia Vaccine  Completed   HPV Vaccine  Aged Out   Meningitis B Vaccine  Aged Out    Advanced directives: (In Chart) A copy of your advanced directives are scanned into your chart should your provider ever need it. Advance Care Planning is important because it:  [x]  Makes sure you receive the medical care that is consistent with your values, goals, and preferences  [x]  It provides guidance to your family and loved ones and reduces their decisional burden about whether or not they are making the right decisions based on your wishes.  Follow the link provided in your after visit summary or read over the paperwork we have mailed to you to help you started getting your Advance Directives in place. If you need assistance in completing  these, please reach out to us  so that we can help you!  See attachments for Preventive Care and Fall Prevention Tips.

## 2024-07-25 ENCOUNTER — Encounter: Payer: Self-pay | Admitting: Internal Medicine

## 2024-07-26 ENCOUNTER — Other Ambulatory Visit: Payer: Self-pay

## 2024-07-26 MED ORDER — ALBUTEROL SULFATE HFA 108 (90 BASE) MCG/ACT IN AERS: INHALATION_SPRAY | RESPIRATORY_TRACT | 3 refills | Status: DC

## 2024-08-31 ENCOUNTER — Telehealth: Admitting: Internal Medicine

## 2024-08-31 ENCOUNTER — Encounter: Payer: Self-pay | Admitting: Internal Medicine

## 2024-08-31 VITALS — Wt 120.0 lb

## 2024-08-31 DIAGNOSIS — G301 Alzheimer's disease with late onset: Secondary | ICD-10-CM | POA: Diagnosis not present

## 2024-08-31 DIAGNOSIS — F02C0 Dementia in other diseases classified elsewhere, severe, without behavioral disturbance, psychotic disturbance, mood disturbance, and anxiety: Secondary | ICD-10-CM

## 2024-08-31 MED ORDER — TRAZODONE HCL 50 MG PO TABS: 50.0000 mg | ORAL_TABLET | Freq: Every day | ORAL | 3 refills | Status: AC

## 2024-08-31 MED ORDER — QUETIAPINE FUMARATE 25 MG PO TABS: 12.5000 mg | ORAL_TABLET | Freq: Two times a day (BID) | ORAL | 0 refills | Status: AC | PRN

## 2024-08-31 NOTE — Progress Notes (Unsigned)
 Virtual Visit via Video Note  I connected with Meeghan G Bohac on 08/31/24 at  8:40 AM EDT by a video enabled telemedicine application and verified that I am speaking with the correct person using two identifiers.  The patient and the provider were at separate locations throughout the entire encounter. Patient location: {}, Provider location: work   I discussed the limitations of evaluation and management by telemedicine and the availability of in person appointments. The patient expressed understanding and agreed to proceed. The patient and the provider were the only parties present for the visit unless noted in HPI below.  History of Present Illness: Discussed the use of AI scribe software for clinical note transcription with the patient, who gave verbal consent to proceed.  History of Present Illness     Observations/Objective: Appearance: {}, breathing appears {}, {} grooming, abdomen {} appear distended, throat {}, memory {}, mental status is {}  Assessment and Plan Assessment & Plan      Follow Up Instructions: {}  I discussed the assessment and treatment plan with the patient. The patient was provided an opportunity to ask questions and all were answered. The patient agreed with the plan and demonstrated an understanding of the instructions.   The patient was advised to call back or seek an in-person evaluation if the symptoms worsen or if the condition fails to improve as anticipated.  Almarie DELENA Cleveland, MD

## 2024-09-01 NOTE — Assessment & Plan Note (Signed)
 Agitation and insomnia have increased. Ativan  was ineffective and caused rebound effects. Trazodone  improved sleep, while hydroxyzine  and melatonin were ineffective. Celexa  was effective but caused cardiac issues. Discussed trazodone  safety and potential dose increase. Considered Seroquel  for daytime agitation. Discontinue Ativan . Continue trazodone  50 mg for sleep, with a possible increase if needed over time. Prescribe Seroquel  for daytime agitation, starting with the lowest dose. Monitor for adverse effects and effectiveness of Seroquel  and trazodone .

## 2024-09-07 ENCOUNTER — Encounter: Payer: Self-pay | Admitting: Internal Medicine

## 2024-09-08 MED ORDER — BUSPIRONE HCL 5 MG PO TABS: 5.0000 mg | ORAL_TABLET | Freq: Two times a day (BID) | ORAL | 3 refills | Status: DC | PRN

## 2024-09-11 ENCOUNTER — Emergency Department (HOSPITAL_BASED_OUTPATIENT_CLINIC_OR_DEPARTMENT_OTHER)

## 2024-09-11 ENCOUNTER — Inpatient Hospital Stay (HOSPITAL_BASED_OUTPATIENT_CLINIC_OR_DEPARTMENT_OTHER)
Admission: EM | Admit: 2024-09-11 | Discharge: 2024-09-14 | DRG: 481 | Disposition: A | Discharge status: Home Health | Attending: Internal Medicine | Admitting: Internal Medicine

## 2024-09-11 ENCOUNTER — Other Ambulatory Visit: Payer: Self-pay

## 2024-09-11 DIAGNOSIS — Z9049 Acquired absence of other specified parts of digestive tract: Secondary | ICD-10-CM | POA: Diagnosis not present

## 2024-09-11 DIAGNOSIS — Z8673 Personal history of transient ischemic attack (TIA), and cerebral infarction without residual deficits: Secondary | ICD-10-CM

## 2024-09-11 DIAGNOSIS — Z79899 Other long term (current) drug therapy: Secondary | ICD-10-CM | POA: Diagnosis not present

## 2024-09-11 DIAGNOSIS — M8580 Other specified disorders of bone density and structure, unspecified site: Secondary | ICD-10-CM | POA: Diagnosis not present

## 2024-09-11 DIAGNOSIS — D62 Acute posthemorrhagic anemia: Secondary | ICD-10-CM | POA: Diagnosis not present

## 2024-09-11 DIAGNOSIS — S2231XA Fracture of one rib, right side, initial encounter for closed fracture: Secondary | ICD-10-CM | POA: Diagnosis not present

## 2024-09-11 DIAGNOSIS — F02818 Dementia in other diseases classified elsewhere, unspecified severity, with other behavioral disturbance: Secondary | ICD-10-CM | POA: Diagnosis present

## 2024-09-11 DIAGNOSIS — S72142A Displaced intertrochanteric fracture of left femur, initial encounter for closed fracture: Secondary | ICD-10-CM | POA: Diagnosis not present

## 2024-09-11 DIAGNOSIS — Z8616 Personal history of COVID-19: Secondary | ICD-10-CM | POA: Diagnosis not present

## 2024-09-11 DIAGNOSIS — E785 Hyperlipidemia, unspecified: Secondary | ICD-10-CM | POA: Diagnosis not present

## 2024-09-11 DIAGNOSIS — M9701XA Periprosthetic fracture around internal prosthetic right hip joint, initial encounter: Secondary | ICD-10-CM | POA: Diagnosis not present

## 2024-09-11 DIAGNOSIS — I2510 Atherosclerotic heart disease of native coronary artery without angina pectoris: Secondary | ICD-10-CM | POA: Diagnosis present

## 2024-09-11 DIAGNOSIS — Z7401 Bed confinement status: Secondary | ICD-10-CM | POA: Diagnosis not present

## 2024-09-11 DIAGNOSIS — W19XXXA Unspecified fall, initial encounter: Secondary | ICD-10-CM | POA: Diagnosis not present

## 2024-09-11 DIAGNOSIS — Z8051 Family history of malignant neoplasm of kidney: Secondary | ICD-10-CM | POA: Diagnosis not present

## 2024-09-11 DIAGNOSIS — S7292XA Unspecified fracture of left femur, initial encounter for closed fracture: Secondary | ICD-10-CM | POA: Diagnosis not present

## 2024-09-11 DIAGNOSIS — S72145A Nondisplaced intertrochanteric fracture of left femur, initial encounter for closed fracture: Principal | ICD-10-CM | POA: Diagnosis present

## 2024-09-11 DIAGNOSIS — R001 Bradycardia, unspecified: Secondary | ICD-10-CM | POA: Diagnosis not present

## 2024-09-11 DIAGNOSIS — I771 Stricture of artery: Secondary | ICD-10-CM | POA: Diagnosis not present

## 2024-09-11 DIAGNOSIS — R000 Tachycardia, unspecified: Secondary | ICD-10-CM | POA: Diagnosis not present

## 2024-09-11 DIAGNOSIS — N3000 Acute cystitis without hematuria: Secondary | ICD-10-CM | POA: Diagnosis present

## 2024-09-11 DIAGNOSIS — M9702XA Periprosthetic fracture around internal prosthetic left hip joint, initial encounter: Secondary | ICD-10-CM | POA: Diagnosis present

## 2024-09-11 DIAGNOSIS — G309 Alzheimer's disease, unspecified: Secondary | ICD-10-CM | POA: Diagnosis present

## 2024-09-11 DIAGNOSIS — B9620 Unspecified Escherichia coli [E. coli] as the cause of diseases classified elsewhere: Secondary | ICD-10-CM | POA: Diagnosis present

## 2024-09-11 DIAGNOSIS — S72002A Fracture of unspecified part of neck of left femur, initial encounter for closed fracture: Secondary | ICD-10-CM | POA: Diagnosis present

## 2024-09-11 DIAGNOSIS — E441 Mild protein-calorie malnutrition: Secondary | ICD-10-CM | POA: Diagnosis present

## 2024-09-11 DIAGNOSIS — W06XXXA Fall from bed, initial encounter: Secondary | ICD-10-CM | POA: Diagnosis present

## 2024-09-11 DIAGNOSIS — N289 Disorder of kidney and ureter, unspecified: Secondary | ICD-10-CM | POA: Diagnosis not present

## 2024-09-11 DIAGNOSIS — K219 Gastro-esophageal reflux disease without esophagitis: Secondary | ICD-10-CM | POA: Diagnosis present

## 2024-09-11 DIAGNOSIS — R404 Transient alteration of awareness: Secondary | ICD-10-CM | POA: Diagnosis not present

## 2024-09-11 DIAGNOSIS — G9389 Other specified disorders of brain: Secondary | ICD-10-CM | POA: Diagnosis not present

## 2024-09-11 DIAGNOSIS — M545 Low back pain, unspecified: Secondary | ICD-10-CM | POA: Diagnosis not present

## 2024-09-11 DIAGNOSIS — Y92003 Bedroom of unspecified non-institutional (private) residence as the place of occurrence of the external cause: Secondary | ICD-10-CM

## 2024-09-11 DIAGNOSIS — Z880 Allergy status to penicillin: Secondary | ICD-10-CM

## 2024-09-11 DIAGNOSIS — I700 Atherosclerosis of aorta: Secondary | ICD-10-CM | POA: Diagnosis not present

## 2024-09-11 DIAGNOSIS — Z800 Family history of malignant neoplasm of digestive organs: Secondary | ICD-10-CM | POA: Diagnosis not present

## 2024-09-11 DIAGNOSIS — Z85038 Personal history of other malignant neoplasm of large intestine: Secondary | ICD-10-CM | POA: Diagnosis not present

## 2024-09-11 DIAGNOSIS — R9082 White matter disease, unspecified: Secondary | ICD-10-CM | POA: Diagnosis not present

## 2024-09-11 DIAGNOSIS — Z885 Allergy status to narcotic agent status: Secondary | ICD-10-CM

## 2024-09-11 DIAGNOSIS — Z66 Do not resuscitate: Secondary | ICD-10-CM | POA: Diagnosis not present

## 2024-09-11 DIAGNOSIS — Z681 Body mass index (BMI) 19 or less, adult: Secondary | ICD-10-CM

## 2024-09-11 DIAGNOSIS — Z888 Allergy status to other drugs, medicaments and biological substances status: Secondary | ICD-10-CM

## 2024-09-11 DIAGNOSIS — R296 Repeated falls: Secondary | ICD-10-CM | POA: Diagnosis present

## 2024-09-11 DIAGNOSIS — M4187 Other forms of scoliosis, lumbosacral region: Secondary | ICD-10-CM | POA: Diagnosis not present

## 2024-09-11 DIAGNOSIS — S72001A Fracture of unspecified part of neck of right femur, initial encounter for closed fracture: Secondary | ICD-10-CM | POA: Diagnosis not present

## 2024-09-11 DIAGNOSIS — Z043 Encounter for examination and observation following other accident: Secondary | ICD-10-CM | POA: Diagnosis not present

## 2024-09-11 DIAGNOSIS — F32A Depression, unspecified: Secondary | ICD-10-CM | POA: Diagnosis not present

## 2024-09-11 DIAGNOSIS — G319 Degenerative disease of nervous system, unspecified: Secondary | ICD-10-CM | POA: Diagnosis not present

## 2024-09-11 DIAGNOSIS — Z472 Encounter for removal of internal fixation device: Secondary | ICD-10-CM | POA: Diagnosis not present

## 2024-09-11 DIAGNOSIS — F0390 Unspecified dementia without behavioral disturbance: Secondary | ICD-10-CM | POA: Diagnosis not present

## 2024-09-11 DIAGNOSIS — S8992XA Unspecified injury of left lower leg, initial encounter: Secondary | ICD-10-CM | POA: Diagnosis not present

## 2024-09-11 DIAGNOSIS — Z801 Family history of malignant neoplasm of trachea, bronchus and lung: Secondary | ICD-10-CM

## 2024-09-11 DIAGNOSIS — Z96641 Presence of right artificial hip joint: Secondary | ICD-10-CM | POA: Diagnosis present

## 2024-09-11 DIAGNOSIS — S72145D Nondisplaced intertrochanteric fracture of left femur, subsequent encounter for closed fracture with routine healing: Secondary | ICD-10-CM | POA: Diagnosis not present

## 2024-09-11 DIAGNOSIS — K5730 Diverticulosis of large intestine without perforation or abscess without bleeding: Secondary | ICD-10-CM | POA: Diagnosis not present

## 2024-09-11 DIAGNOSIS — Z9849 Cataract extraction status, unspecified eye: Secondary | ICD-10-CM

## 2024-09-11 LAB — CBC WITH DIFFERENTIAL/PLATELET
Abs Immature Granulocytes: 0.03 K/uL (ref 0.00–0.07)
Basophils Absolute: 0 K/uL (ref 0.0–0.1)
Basophils Relative: 0 %
Eosinophils Absolute: 0.1 K/uL (ref 0.0–0.5)
Eosinophils Relative: 1 %
HCT: 33.2 % — ABNORMAL LOW (ref 36.0–46.0)
Hemoglobin: 10.7 g/dL — ABNORMAL LOW (ref 12.0–15.0)
Immature Granulocytes: 0 %
Lymphocytes Relative: 20 %
Lymphs Abs: 1.8 K/uL (ref 0.7–4.0)
MCH: 28.6 pg (ref 26.0–34.0)
MCHC: 32.2 g/dL (ref 30.0–36.0)
MCV: 88.8 fL (ref 80.0–100.0)
Monocytes Absolute: 0.8 K/uL (ref 0.1–1.0)
Monocytes Relative: 8 %
Neutro Abs: 6.4 K/uL (ref 1.7–7.7)
Neutrophils Relative %: 71 %
Platelets: 220 K/uL (ref 150–400)
RBC: 3.74 MIL/uL — ABNORMAL LOW (ref 3.87–5.11)
RDW: 15.3 % (ref 11.5–15.5)
WBC: 9.2 K/uL (ref 4.0–10.5)
nRBC: 0 % (ref 0.0–0.2)

## 2024-09-11 LAB — COMPREHENSIVE METABOLIC PANEL WITH GFR
ALT: 8 U/L (ref 0–44)
AST: 20 U/L (ref 15–41)
Albumin: 3.9 g/dL (ref 3.5–5.0)
Alkaline Phosphatase: 74 U/L (ref 38–126)
Anion gap: 12 (ref 5–15)
BUN: 22 mg/dL (ref 8–23)
CO2: 24 mmol/L (ref 22–32)
Calcium: 9.3 mg/dL (ref 8.9–10.3)
Chloride: 100 mmol/L (ref 98–111)
Creatinine, Ser: 0.66 mg/dL (ref 0.44–1.00)
GFR, Estimated: >60 mL/min (ref >60)
Glucose, Bld: 108 mg/dL — ABNORMAL HIGH (ref 70–99)
Potassium: 4.1 mmol/L (ref 3.5–5.1)
Sodium: 135 mmol/L (ref 135–145)
Total Bilirubin: 0.6 mg/dL (ref 0.0–1.2)
Total Protein: 7.5 g/dL (ref 6.5–8.1)

## 2024-09-11 LAB — URINALYSIS, ROUTINE W REFLEX MICROSCOPIC
Bilirubin Urine: NEGATIVE
Glucose, UA: NEGATIVE mg/dL
Nitrite: POSITIVE — AB
Specific Gravity, Urine: 1.016 (ref 1.005–1.030)
WBC, UA: >50 WBC/hpf (ref 0–5)
pH: 6 (ref 5.0–8.0)

## 2024-09-11 LAB — LIPASE, BLOOD: Lipase: 24 U/L (ref 11–51)

## 2024-09-11 MED ORDER — ALBUTEROL SULFATE (2.5 MG/3ML) 0.083% IN NEBU: 2.5000 mg | INHALATION_SOLUTION | RESPIRATORY_TRACT | Status: DC | PRN

## 2024-09-11 MED ORDER — MELATONIN 3 MG PO TABS
6.0000 mg | ORAL_TABLET | Freq: Every day | ORAL | Status: DC
  Filled 2024-09-11: qty 2

## 2024-09-11 MED ORDER — FENTANYL CITRATE PF 50 MCG/ML IJ SOSY
25.0000 ug | PREFILLED_SYRINGE | Freq: Once | INTRAMUSCULAR | Status: AC
  Administered 2024-09-11: 25 ug via INTRAVENOUS
  Filled 2024-09-11: qty 1

## 2024-09-11 MED ORDER — VITAMIN D 25 MCG (1000 UNIT) PO TABS
1000.0000 [IU] | ORAL_TABLET | Freq: Every day | ORAL | Status: DC
  Administered 2024-09-13 – 2024-09-14 (×2): 1000 [IU] via ORAL
  Filled 2024-09-11 (×2): qty 1

## 2024-09-11 MED ORDER — SODIUM CHLORIDE 0.9 % IV SOLN
2.0000 g | Freq: Once | INTRAVENOUS | Status: AC
  Administered 2024-09-11: 2 g via INTRAVENOUS

## 2024-09-11 MED ORDER — OXYCODONE HCL 5 MG PO TABS
5.0000 mg | ORAL_TABLET | ORAL | Status: DC | PRN
  Administered 2024-09-13: 5 mg via ORAL
  Filled 2024-09-11: qty 1

## 2024-09-11 MED ORDER — ONDANSETRON HCL 4 MG/2ML IJ SOLN: 4.0000 mg | Freq: Four times a day (QID) | INTRAMUSCULAR | Status: DC | PRN

## 2024-09-11 MED ORDER — SODIUM CHLORIDE 0.9% FLUSH
3.0000 mL | Freq: Two times a day (BID) | INTRAVENOUS | Status: DC
  Administered 2024-09-12 – 2024-09-14 (×5): 3 mL via INTRAVENOUS

## 2024-09-11 MED ORDER — OXYCODONE HCL 5 MG PO TABS: 2.5000 mg | ORAL_TABLET | ORAL | Status: DC | PRN

## 2024-09-11 MED ORDER — HALOPERIDOL LACTATE 5 MG/ML IJ SOLN
2.0000 mg | Freq: Once | INTRAMUSCULAR | Status: AC
  Administered 2024-09-11: 2 mg via INTRAVENOUS
  Filled 2024-09-11: qty 1

## 2024-09-11 MED ORDER — HYDROMORPHONE HCL 1 MG/ML IJ SOLN: 0.5000 mg | INTRAMUSCULAR | Status: DC | PRN

## 2024-09-11 MED ORDER — ACETAMINOPHEN 500 MG PO TABS
1000.0000 mg | ORAL_TABLET | Freq: Four times a day (QID) | ORAL | Status: DC | PRN
  Administered 2024-09-13 – 2024-09-14 (×2): 1000 mg via ORAL
  Filled 2024-09-11 (×4): qty 2

## 2024-09-11 MED ORDER — METHOCARBAMOL 500 MG PO TABS: 500.0000 mg | ORAL_TABLET | Freq: Three times a day (TID) | ORAL | Status: DC | PRN

## 2024-09-11 MED ORDER — POLYETHYLENE GLYCOL 3350 17 G PO PACK: 17.0000 g | PACK | Freq: Every day | ORAL | Status: DC | PRN

## 2024-09-11 NOTE — Progress Notes (Signed)
 Plan of Care Note for accepted transfer   Patient: [name] Barker MRN: [id number]   DOA: 09/11/2024  Facility requesting transfer: [facility] Requesting Provider: Dr. Ruthe Reason for transfer: Left hip fracture Facility course:  Patient is a 89 year old female with history of Alzheimer's dementia with agitation, history of CVA, HLD who had a witnessed fall from a low bed to the floor yesterday.  Since then she has been complaining of left hip pain.  Normally she ambulates with a walker or uses a wheelchair.  Extensive trauma imaging performed in the ED notable for nondisplaced fractures of the left femoral neck and intertrochanteric region.  Also noted to have displaced fracture involving the posterior right 12th rib.  UA is abnormal.  Patient was given IV ceftriaxone  and urine culture is in process.  Patient received IV Haldol  2 mg and IV fentanyl  25 mcg while in the ED.  EDP discussed with orthopedics Dr. Celena who requested medical admission to [facility] and keep patient n.p.o. after midnight.  The hospitalist service was consulted for admission.  Plan of care: The patient is accepted for admission to Med-surg  unit, at [facility].  Author: Jorie Blanch, MD 09/11/2024  Check [email or web address] for on-call coverage.  Nursing staff, Please call [facility] Admits & Consults System-Wide number on Amion as soon as patient's arrival, so appropriate admitting provider can evaluate the pt.

## 2024-09-11 NOTE — ED Provider Notes (Signed)
  EMERGENCY DEPARTMENT AT [facility] Provider Note   CSN: [id number] Arrival date & time: 09/11/24  1507     Patient presents with: No chief complaint on file.   [name] Barker is a 89 y.o. female.   Level 5 caveat due to dementia.  Talked with the family on the phone where she had a witnessed fall from a low bed to the floor.  Seems like she has not been able to really ambulate since the fall yesterday.  Seems to be guarding her left hip per family.  Seems like that is where she is mostly tender with EMS as well.  She had normal vitals.  She is DNR.  She lives at home with family.  She has wheelchair or walker there.  Family helps her.  She is able to normally ambulate pretty well with a walker but has not been able to since the fall.  She has had prior injuries to both the left and the right hip.  The history is provided by a caregiver.       Prior to Admission medications   Medication Sig Start Date End Date Taking? Authorizing Provider  acetaminophen  (TYLENOL ) 500 MG tablet Take 500 mg by mouth 2 (two) times a day. FOR BACK PAIN    [provider]  albuterol  (VENTOLIN  HFA) 108 (90 Base) MCG/ACT inhaler INHALE 2 PUFFS BY MOUTH EVERY 6 HOURS AS NEEDED FOR WHEEZING OR FOR SHORTNESS OF BREATH 07/26/24   Rollene Almarie LABOR, MD  amoxicillin -clavulanate (AUGMENTIN ) 875-125 MG tablet Take 1 tablet by mouth 2 (two) times daily. 01/25/24   Rollene Almarie LABOR, MD  busPIRone  (BUSPAR ) 5 MG tablet Take 1 tablet (5 mg total) by mouth 2 (two) times daily as needed. 09/08/24   Rollene Almarie LABOR, MD  citalopram  (CELEXA ) 10 MG tablet Take 1 tablet (10 mg total) by mouth daily. 12/28/23   Rollene Almarie LABOR, MD  Cranberry 500 MG TABS Take 500 mg by mouth in the morning and at bedtime.    [provider]  famotidine  (PEPCID ) 20 MG tablet Take 1 tablet (20 mg total) by mouth at bedtime. 01/25/24   Rollene Almarie LABOR, MD  fexofenadine (ALLEGRA) 180 MG  tablet Take 180 mg by mouth in the morning.    [provider]  fluticasone  (FLONASE ) 50 MCG/ACT nasal spray Place 1 spray into both nostrils in the morning and at bedtime.    [provider]  QUEtiapine  (SEROQUEL ) 25 MG tablet Take 0.5-1 tablets (12.5-25 mg total) by mouth 2 (two) times daily as needed (agitation). 08/31/24   Rollene Almarie LABOR, MD  traZODone  (DESYREL ) 50 MG tablet Take 1 tablet (50 mg total) by mouth at bedtime. 08/31/24   Rollene Almarie LABOR, MD  pantoprazole  (PROTONIX ) 40 MG tablet Take 1 tablet (40 mg total) by mouth daily. Patient not taking: Reported on 02/05/2021 01/30/21 02/05/21  Singh, Prashant K, MD    Allergies: Morphine  and codeine, Flexeril  [cyclobenzaprine ], Methenamine mandelate, Penicillins, Promethazine hcl, Statins, Ativan  [lorazepam ], and Morphine  sulfate    Review of Systems  Updated Vital Signs BP (!) 150/75   Pulse 100   Temp 98 F (36.7 C) (Axillary)   Resp 18   Wt 45.8 kg   SpO2 100%   BMI 19.06 kg/m   Physical Exam Vitals and nursing note reviewed.  Constitutional:      General: She is not in acute distress.    Appearance: She is well-developed. She is not ill-appearing.  HENT:  Head: Normocephalic and atraumatic.     Nose: Nose normal.     Mouth/Throat:     Mouth: Mucous membranes are moist.  Eyes:     Extraocular Movements: Extraocular movements intact.     Conjunctiva/sclera: Conjunctivae normal.     Pupils: Pupils are equal, round, and reactive to light.  Cardiovascular:     Rate and Rhythm: Normal rate and regular rhythm.     Pulses: Normal pulses.     Heart sounds: Normal heart sounds. No murmur heard. Pulmonary:     Effort: Pulmonary effort is normal. No respiratory distress.     Breath sounds: Normal breath sounds.  Abdominal:     Palpations: Abdomen is soft.     Tenderness: There is no abdominal tenderness.  Musculoskeletal:        General: Tenderness present. No swelling.     Cervical back: Normal  range of motion and neck supple.     Comments: She seems to be mostly tender in the left hip on exam no obvious deformity other extremities  Skin:    General: Skin is warm and dry.     Capillary Refill: Capillary refill takes less than 2 seconds.  Neurological:     General: No focal deficit present.     Mental Status: She is alert. Mental status is at baseline.  Psychiatric:        Mood and Affect: Mood normal.     (all labs ordered are listed, but only abnormal results are displayed) Labs Reviewed  CBC WITH DIFFERENTIAL/PLATELET - Abnormal; Notable for the following components:      Result Value   RBC 3.74 (*)    Hemoglobin 10.7 (*)    HCT 33.2 (*)    All other components within normal limits  COMPREHENSIVE METABOLIC PANEL WITH GFR - Abnormal; Notable for the following components:   Glucose, Bld 108 (*)    All other components within normal limits  URINALYSIS, ROUTINE W REFLEX MICROSCOPIC - Abnormal; Notable for the following components:   APPearance HAZY (*)    Hgb urine dipstick SMALL (*)    Ketones, ur TRACE (*)    Protein, ur TRACE (*)    Nitrite POSITIVE (*)    Leukocytes,Ua LARGE (*)    Bacteria, UA RARE (*)    All other components within normal limits  URINE CULTURE  LIPASE, BLOOD    EKG: EKG Interpretation Date/Time:  Sunday September 11 2024 16:35:28 EDT Ventricular Rate:  110 PR Interval:  144 QRS Duration:  110 QT Interval:  372 QTC Calculation: 504 R Axis:   -66  Text Interpretation: Sinus tachycardia Left anterior fascicular block Confirmed by Ruthe Cornet [phone]) on 09/11/2024 4:42:20 PM  Radiology: ARCOLA Femur Min 2 Views Left Result Date: 09/11/2024 CLINICAL DATA:  Clemens.  Left hip pain. EXAM: LEFT FEMUR 2 VIEWS COMPARISON:  Prior radiographs from 2025 FINDINGS: Long lateral sideplate and compression screws related to a prior distal femur fracture. No complicating features. The left hip is normally located. Nondisplaced fractures. Nondisplaced  fractures of the lower femoral neck and also the intertrochanteric region. IMPRESSION: 1. Lower left femoral neck and intertrochanteric fractures, nondisplaced. 2. Remote posttraumatic and postsurgical changes involving the distal femur. Electronically Signed   By: MYRTIS Stammer M.D.   On: 09/11/2024 17:23   DG Hip Unilat With Pelvis 2-3 Views Left Result Date: 09/11/2024 CLINICAL DATA:  Clemens.  Left hip pain. EXAM: DG HIP (WITH OR WITHOUT PELVIS) 2-3V LEFT COMPARISON:  Radiographs from 2022  FINDINGS: Right hip hardware intact. No complicating features. The left hip is normally located. Nondisplaced intertrochanteric fracture. No definite pelvic fractures. IMPRESSION: Nondisplaced intertrochanteric fracture of the left hip. Electronically Signed   By: MYRTIS Stammer M.D.   On: 09/11/2024 17:22   CT Lumbar Spine Wo Contrast Result Date: 09/11/2024 CLINICAL DATA:  Unwitnessed fall.  Back pain. EXAM: CT LUMBAR SPINE WITHOUT CONTRAST TECHNIQUE: Multidetector CT imaging of the lumbar spine was performed without intravenous contrast administration. Multiplanar CT image reconstructions were also generated. RADIATION DOSE REDUCTION: This exam was performed according to the departmental dose-optimization program which includes automated exposure control, adjustment of the mA and/or kV according to patient size and/or use of iterative reconstruction technique. COMPARISON:  Prior CT scan from 2015 FINDINGS: Segmentation: Could There are five lumbar type vertebral bodies. The last full intervertebral disc space is labeled L5-S1. Alignment: Significant left convex scoliosis but normal overall alignment in the sagittal plane. Vertebrae: Stable fusion hardware at L2-3. No complicating features. No acute lumbar spine fracture. No upper pelvic fractures. There is a displaced fracture involving the right twelfth rib. No transverse process fractures. Paraspinal and other soft tissues: No significant paraspinal findings. Advanced  vascular disease but no aneurysm. Severe colonic diverticulosis. Disc levels: No large disc protrusions or significant canal stenosis. IMPRESSION: 1. Displaced fracture involving the posterior right twelfth rib. 2. No acute lumbar spine fracture. 3. Stable fusion hardware at L2-3. 4. Significant left convex scoliosis. 5. No large disc protrusions or significant canal stenosis. 6. Aortic atherosclerosis. Aortic Atherosclerosis (ICD10-I70.0). Electronically Signed   By: MYRTIS Stammer M.D.   On: 09/11/2024 17:20   CT PELVIS WO CONTRAST Result Date: 09/11/2024 CLINICAL DATA:  Clemens.  Left hip pain. EXAM: CT PELVIS WITHOUT CONTRAST TECHNIQUE: Multidetector CT imaging of the pelvis was performed following the standard protocol without intravenous contrast. RADIATION DOSE REDUCTION: This exam was performed according to the departmental dose-optimization program which includes automated exposure control, adjustment of the mA and/or kV according to patient size and/or use of iterative reconstruction technique. COMPARISON:  Radiographs, same date. FINDINGS: Both hips are normally located. There are nondisplaced fractures of the left femoral neck and intertrochanteric region. The pubic symphysis and SI joints are intact. No pelvic fractures are identified. Right hip hardware related to prior fracture fixation with gamma nail and dynamic hip screw. No complicating features. No significant intrapelvic abnormalities are identified. Severe sigmoid colon diverticulosis. Advanced aortic and iliac artery calcifications but no aneurysm. IMPRESSION: 1. Nondisplaced fractures of the left femoral neck and intertrochanteric region. 2. Right hip hardware related to prior fracture fixation with gamma nail and dynamic hip screw. No complicating features. 3. Severe sigmoid colon diverticulosis. 4. Aortic atherosclerosis. Aortic Atherosclerosis (ICD10-I70.0). Electronically Signed   By: MYRTIS Stammer M.D.   On: 09/11/2024 17:16   CT Head  Wo Contrast Result Date: 09/11/2024 CLINICAL DATA:  Unwitnessed fall. EXAM: CT HEAD WITHOUT CONTRAST CT CERVICAL SPINE WITHOUT CONTRAST TECHNIQUE: Multidetector CT imaging of the head and cervical spine was performed following the standard protocol without intravenous contrast. Multiplanar CT image reconstructions of the cervical spine were also generated. RADIATION DOSE REDUCTION: This exam was performed according to the departmental dose-optimization program which includes automated exposure control, adjustment of the mA and/or kV according to patient size and/or use of iterative reconstruction technique. COMPARISON:  04/10/2024 FINDINGS: CT HEAD FINDINGS Brain: Stable age related cerebral atrophy, ventriculomegaly and periventricular white matter disease. No extra-axial fluid collections are identified. No CT findings for acute hemispheric infarction  or intracranial hemorrhage. No mass lesions. The brainstem and cerebellum grossly normal. There is a small probable lacunar type infarct in the brainstem. Vascular: Stable vascular calcifications. Skull: No acute skull fracture. Sinuses/Orbits: The paranasal sinuses and mastoid air cells are clear. The globes are intact. Other: No scalp lesion or scalp hematoma. CT CERVICAL SPINE FINDINGS Alignment: Degenerative cervical spondylosis with mild multilevel degenerative subluxations but the overall alignment is maintained. Skull base and vertebrae: No acute fracture. No primary bone lesion or focal pathologic process. Soft tissues and spinal canal: No prevertebral fluid or swelling. No visible canal hematoma. Disc levels: The spinal canal is generous. No significant canal stenosis. Upper chest: No significant findings. Other: Bilateral carotid artery calcifications. Stable right thyroid  goiter. IMPRESSION: 1. Stable age related cerebral atrophy, ventriculomegaly and periventricular white matter disease. 2. No acute intracranial findings or skull fracture. 3.  Degenerative cervical spondylosis with mild multilevel degenerative subluxations but no acute cervical spine fracture. Electronically Signed   By: MYRTIS Stammer M.D.   On: 09/11/2024 17:11   CT Cervical Spine Wo Contrast Result Date: 09/11/2024 CLINICAL DATA:  Unwitnessed fall. EXAM: CT HEAD WITHOUT CONTRAST CT CERVICAL SPINE WITHOUT CONTRAST TECHNIQUE: Multidetector CT imaging of the head and cervical spine was performed following the standard protocol without intravenous contrast. Multiplanar CT image reconstructions of the cervical spine were also generated. RADIATION DOSE REDUCTION: This exam was performed according to the departmental dose-optimization program which includes automated exposure control, adjustment of the mA and/or kV according to patient size and/or use of iterative reconstruction technique. COMPARISON:  04/10/2024 FINDINGS: CT HEAD FINDINGS Brain: Stable age related cerebral atrophy, ventriculomegaly and periventricular white matter disease. No extra-axial fluid collections are identified. No CT findings for acute hemispheric infarction or intracranial hemorrhage. No mass lesions. The brainstem and cerebellum grossly normal. There is a small probable lacunar type infarct in the brainstem. Vascular: Stable vascular calcifications. Skull: No acute skull fracture. Sinuses/Orbits: The paranasal sinuses and mastoid air cells are clear. The globes are intact. Other: No scalp lesion or scalp hematoma. CT CERVICAL SPINE FINDINGS Alignment: Degenerative cervical spondylosis with mild multilevel degenerative subluxations but the overall alignment is maintained. Skull base and vertebrae: No acute fracture. No primary bone lesion or focal pathologic process. Soft tissues and spinal canal: No prevertebral fluid or swelling. No visible canal hematoma. Disc levels: The spinal canal is generous. No significant canal stenosis. Upper chest: No significant findings. Other: Bilateral carotid artery  calcifications. Stable right thyroid  goiter. IMPRESSION: 1. Stable age related cerebral atrophy, ventriculomegaly and periventricular white matter disease. 2. No acute intracranial findings or skull fracture. 3. Degenerative cervical spondylosis with mild multilevel degenerative subluxations but no acute cervical spine fracture. Electronically Signed   By: MYRTIS Stammer M.D.   On: 09/11/2024 17:11   DG Chest Portable 1 View Result Date: 09/11/2024 CLINICAL DATA:  Unwitnessed fall. EXAM: PORTABLE CHEST 1 VIEW COMPARISON:  04/10/2024 FINDINGS: The cardiac silhouette, mediastinal and hilar contours are within normal limits for age and stable. Stable tortuosity and calcification of the thoracic aorta. No acute pulmonary findings. No pleural effusion, pneumothorax or pulmonary contusion. The bony thorax is intact.  No definite rib fractures. IMPRESSION: No acute cardiopulmonary findings. Electronically Signed   By: MYRTIS Stammer M.D.   On: 09/11/2024 17:05     Procedures   Medications Ordered in the ED  cefTRIAXone  (ROCEPHIN ) 2 g in sodium chloride  0.9 % 100 mL IVPB (has no administration in time range)  fentaNYL  (SUBLIMAZE ) injection 25 mcg (25  mcg Intravenous Given 09/11/24 1714)                                    Medical Decision Making Amount and/or Complexity of Data Reviewed Labs: ordered. Radiology: ordered.  Risk Prescription drug management. Decision regarding hospitalization.   [name] Barker is here with a witnessed fall.  History of dementia.  She is DNR.  Normal vitals.  No fever.  Per family on the phone witness fall yesterday where she fell from the bed to the floor.  She is not been ambulatory since.  Usually uses a walker to get around.  She does have dementia.  She has been a little bit more sedated recently as they have been starting some medicines like trazodone  and Seroquel  but seems like Seroquel  has been over sedating her.  Concern for may be UTI per family as well.   She is mostly favoring her left hip/tender in the spot but will get CT scan of the head and neck lumbar spine and pelvis and get x-rays of the hip and femur and chest.  Will check basic labs and will get urinalysis.  Urinalysis looks consistent with infection.  Will get urine culture and start IV antibiotics.  She has no leukocytosis and no fever and doubt this.  Lab work was otherwise unremarkable.  CT imaging does show nondisplaced fractures of the left femoral neck and intertrochanteric region.  Otherwise she has 1 rib fracture on the right.  But otherwise CT images and x-rays are unremarkable.  I talked with Dr. Sherida with orthopedics who recommended that we talk to trauma Ortho group Dr. Kendal as he did prior femur repair about 6 years ago.  I have messaged Dr. Kendal and Dr. Celena and their PAs about patient.  Will have them admitted to [facility] for likely repair of this injury.  Family updated with plan.  Hemodynamically stable throughout my care.  This chart was dictated using voice recognition software.  Despite best efforts to proofread,  errors can occur which can change the documentation meaning.      Final diagnoses:  Closed fracture of left femur, unspecified fracture morphology, unspecified portion of femur, initial encounter (HCC)  Closed fracture of one rib of right side, initial encounter  Acute cystitis without hematuria    ED Discharge Orders     None          Ruthe Cornet, DO 09/11/24 1803

## 2024-09-11 NOTE — Anesthesia Preprocedure Evaluation (Addendum)
 Anesthesia Evaluation  Patient identified by MRN, date of birth, ID band Patient confused    Reviewed: Allergy & Precautions, NPO status , Patient's Chart, lab work & pertinent test results  History of Anesthesia Complications Negative for: history of anesthetic complications  Airway Mallampati: Unable to assess       Dental  (+) Edentulous Upper   Pulmonary     + decreased breath sounds      Cardiovascular + CAD  (-) CHF (-) pacemaker+ Valvular Problems/Murmurs (Mild AI; mild-moderate MAC)  Rhythm:Regular Rate:Normal  TTE (2020):  1. LV EF 60-65%. Mild concentric  LVH. No RWMA.   2. RV normal size and function.   3. The mitral valve is degenerative. Mild-moderate MAC.   4. Tricuspid. Mild AI.     Neuro/Psych  PSYCHIATRIC DISORDERS     Dementia Bilateral Hearing Loss TIA   GI/Hepatic ,GERD  ,,  Endo/Other    Renal/GU Renal disease     Musculoskeletal   Abdominal   Peds  Hematology  (+) Blood dyscrasia, anemia Hgb 10.7, Plts 220K (9/14)   Anesthesia Other Findings   Reproductive/Obstetrics                              Anesthesia Physical Anesthesia Plan  ASA: 3  Anesthesia Plan: General   Post-op Pain Management:    Induction: Intravenous  PONV Risk Score and Plan: 2 and TIVA  Airway Management Planned: Oral ETT  Additional Equipment:   Intra-op Plan:   Post-operative Plan: Extubation in OR  Informed Consent:      Dental advisory given  Plan Discussed with: CRNA and Surgeon  Anesthesia Plan Comments: (DNR rescinded after discussion with patient's daughter at bedside. Type and Screen ordered. )         Anesthesia Quick Evaluation

## 2024-09-11 NOTE — Progress Notes (Signed)
 Pt arrived to room 6N16 via stretcher/[facility] from [facility] with Left hip fracture.  Pt is alert, but does not follow commands or answer questions which is r/t Dementia.  She also arrives with bilateral mitts in place.  No family members present upon arrival.    09/11/24 2224  Vitals  Temp 99 F (37.2 C)  Temp Source Axillary  BP (!) 162/90  MAP (mmHg) 110  BP Location Right Arm  BP Method Automatic  Patient Position (if appropriate) Lying  Pulse Rate (!) 105  Pulse Rate Source Dinamap  Resp 20  Level of Consciousness  Level of Consciousness Alert  MEWS COLOR  MEWS Score Color Green  Oxygen Therapy  SpO2 94 %  O2 Device Room Air  Pain Assessment  Pain Scale PAINAD  Pain Score 2  PAINAD (Pain Assessment in Advanced Dementia)  Breathing 1  Negative Vocalization 1  Facial Expression 0  Body Language 0  Consolability 0  PAINAD Score 2  POSS Scale (Pasero Opioid Sedation Scale)  POSS *See Group Information* 1-Acceptable,Awake and alert  Height and Weight  Weight 42.3 kg (zeroed out with no equipment in place)  Type of Scale Used Bed  Type of Weight Actual  Glasgow Coma Scale  Eye Opening 4  Best Verbal Response (NON-intubated) 3  Best Motor Response 6  Glasgow Coma Scale Score 13  MEWS Score  MEWS Temp 0  MEWS Systolic 0  MEWS Pulse 1  MEWS RR 0  MEWS LOC 0  MEWS Score 1   Glade Lee BSN RN [facility] 09/11/2024, 10:38 PM

## 2024-09-11 NOTE — ED Triage Notes (Signed)
 89 y.o. F BIB [facility] from home.   Family reports pt slid from low bed to floor-witnessed. No head strike. Not much was thought of the fall. This morning pt is guarding left leg.   EMS: 118/76 80HR SpO2 100% RA  Triage:  Tenderness and guarding around top 3rd of left femur. Previous scans shows right total hip replacement and left femur hardware.

## 2024-09-12 ENCOUNTER — Inpatient Hospital Stay (HOSPITAL_COMMUNITY): Payer: Self-pay

## 2024-09-12 ENCOUNTER — Encounter (HOSPITAL_COMMUNITY)
Admission: EM | Disposition: A | Discharge status: Home Health | Payer: Self-pay | Source: Home / Self Care | Attending: Family Medicine

## 2024-09-12 ENCOUNTER — Inpatient Hospital Stay (HOSPITAL_COMMUNITY)

## 2024-09-12 ENCOUNTER — Encounter (HOSPITAL_COMMUNITY): Payer: Self-pay

## 2024-09-12 DIAGNOSIS — I2510 Atherosclerotic heart disease of native coronary artery without angina pectoris: Secondary | ICD-10-CM

## 2024-09-12 DIAGNOSIS — M9701XA Periprosthetic fracture around internal prosthetic right hip joint, initial encounter: Secondary | ICD-10-CM

## 2024-09-12 DIAGNOSIS — E785 Hyperlipidemia, unspecified: Secondary | ICD-10-CM | POA: Diagnosis not present

## 2024-09-12 DIAGNOSIS — F32A Depression, unspecified: Secondary | ICD-10-CM

## 2024-09-12 DIAGNOSIS — S72002A Fracture of unspecified part of neck of left femur, initial encounter for closed fracture: Secondary | ICD-10-CM

## 2024-09-12 HISTORY — PX: INTRAMEDULLARY (IM) NAIL INTERTROCHANTERIC: SHX5875

## 2024-09-12 HISTORY — PX: HARDWARE REMOVAL: SHX979

## 2024-09-12 LAB — PROTIME-INR
INR: 1.2 (ref 0.8–1.2)
Prothrombin Time: 15.4 s — ABNORMAL HIGH (ref 11.4–15.2)

## 2024-09-12 LAB — BASIC METABOLIC PANEL WITH GFR
Anion gap: 11 (ref 5–15)
BUN: 16 mg/dL (ref 8–23)
CO2: 24 mmol/L (ref 22–32)
Calcium: 8.4 mg/dL — ABNORMAL LOW (ref 8.9–10.3)
Chloride: 99 mmol/L (ref 98–111)
Creatinine, Ser: 0.71 mg/dL (ref 0.44–1.00)
GFR, Estimated: >60 mL/min (ref >60)
Glucose, Bld: 113 mg/dL — ABNORMAL HIGH (ref 70–99)
Potassium: 3.6 mmol/L (ref 3.5–5.1)
Sodium: 134 mmol/L — ABNORMAL LOW (ref 135–145)

## 2024-09-12 LAB — CBC
HCT: 31.8 % — ABNORMAL LOW (ref 36.0–46.0)
Hemoglobin: 10.2 g/dL — ABNORMAL LOW (ref 12.0–15.0)
MCH: 28.7 pg (ref 26.0–34.0)
MCHC: 32.1 g/dL (ref 30.0–36.0)
MCV: 89.3 fL (ref 80.0–100.0)
Platelets: 207 K/uL (ref 150–400)
RBC: 3.56 MIL/uL — ABNORMAL LOW (ref 3.87–5.11)
RDW: 15.3 % (ref 11.5–15.5)
WBC: 9.7 K/uL (ref 4.0–10.5)
nRBC: 0 % (ref 0.0–0.2)

## 2024-09-12 LAB — SURGICAL PCR SCREEN
MRSA, PCR: NEGATIVE
Staphylococcus aureus: NEGATIVE

## 2024-09-12 LAB — VITAMIN D 25 HYDROXY (VIT D DEFICIENCY, FRACTURES): Vit D, 25-Hydroxy: 23.62 ng/mL — ABNORMAL LOW (ref 30–100)

## 2024-09-12 LAB — PHOSPHORUS: Phosphorus: 2.6 mg/dL (ref 2.5–4.6)

## 2024-09-12 LAB — MAGNESIUM: Magnesium: 1.6 mg/dL — ABNORMAL LOW (ref 1.7–2.4)

## 2024-09-12 SURGERY — FIXATION, FRACTURE, INTERTROCHANTERIC, WITH INTRAMEDULLARY ROD
Anesthesia: General | Site: Leg Upper | Laterality: Left

## 2024-09-12 MED ORDER — ORAL CARE MOUTH RINSE: 15.0000 mL | Freq: Once | OROMUCOSAL | Status: AC

## 2024-09-12 MED ORDER — LACTATED RINGERS IV SOLN: INTRAVENOUS | Status: DC

## 2024-09-12 MED ORDER — CEFAZOLIN SODIUM-DEXTROSE 1-4 GM/50ML-% IV SOLN
INTRAVENOUS | Status: DC | PRN
  Administered 2024-09-12: 1 g via INTRAVENOUS

## 2024-09-12 MED ORDER — ONDANSETRON HCL 4 MG/2ML IJ SOLN
INTRAMUSCULAR | Status: DC | PRN
  Administered 2024-09-12: 4 mg via INTRAVENOUS

## 2024-09-12 MED ORDER — VANCOMYCIN HCL 1000 MG IV SOLR: INTRAVENOUS | Status: DC | PRN

## 2024-09-12 MED ORDER — CHLORHEXIDINE GLUCONATE 0.12 % MT SOLN
15.0000 mL | Freq: Once | OROMUCOSAL | Status: AC
  Administered 2024-09-12: 15 mL via OROMUCOSAL
  Filled 2024-09-12: qty 15

## 2024-09-12 MED ORDER — DEXAMETHASONE SODIUM PHOSPHATE 10 MG/ML IJ SOLN
INTRAMUSCULAR | Status: DC | PRN
Start: 2024-09-12 — End: 2024-09-12
  Administered 2024-09-12: 4 mg via INTRAVENOUS

## 2024-09-12 MED ORDER — STERILE WATER FOR IRRIGATION IR SOLN
Status: DC | PRN
  Administered 2024-09-12: 1000 mL

## 2024-09-12 MED ORDER — ONDANSETRON HCL 4 MG PO TABS: 4.0000 mg | ORAL_TABLET | Freq: Four times a day (QID) | ORAL | Status: DC | PRN

## 2024-09-12 MED ORDER — ONDANSETRON HCL 4 MG/2ML IJ SOLN: 4.0000 mg | Freq: Four times a day (QID) | INTRAMUSCULAR | Status: DC | PRN

## 2024-09-12 MED ORDER — FENTANYL CITRATE (PF) 250 MCG/5ML IJ SOLN
INTRAMUSCULAR | Status: DC | PRN
  Administered 2024-09-12: 25 ug via INTRAVENOUS

## 2024-09-12 MED ORDER — 0.9 % SODIUM CHLORIDE (POUR BTL) OPTIME
TOPICAL | Status: DC | PRN
  Administered 2024-09-12: 1000 mL

## 2024-09-12 MED ORDER — PHENYLEPHRINE 80 MCG/ML (10ML) SYRINGE FOR IV PUSH (FOR BLOOD PRESSURE SUPPORT)
PREFILLED_SYRINGE | INTRAVENOUS | Status: DC | PRN
  Administered 2024-09-12: 160 ug via INTRAVENOUS
  Administered 2024-09-12 (×3): 80 ug via INTRAVENOUS

## 2024-09-12 MED ORDER — FENTANYL CITRATE (PF) 250 MCG/5ML IJ SOLN
INTRAMUSCULAR | Status: AC
  Filled 2024-09-12: qty 5

## 2024-09-12 MED ORDER — METOCLOPRAMIDE HCL 5 MG/ML IJ SOLN: 5.0000 mg | Freq: Three times a day (TID) | INTRAMUSCULAR | Status: DC | PRN

## 2024-09-12 MED ORDER — PROPOFOL 10 MG/ML IV BOLUS
INTRAVENOUS | Status: DC | PRN
  Administered 2024-09-12: 50 mg via INTRAVENOUS

## 2024-09-12 MED ORDER — LACTATED RINGERS IV BOLUS
1000.0000 mL | Freq: Once | INTRAVENOUS | Status: AC
Start: 2024-09-12 — End: 2024-09-12
  Administered 2024-09-12: 1000 mL via INTRAVENOUS

## 2024-09-12 MED ORDER — METOPROLOL TARTRATE 5 MG/5ML IV SOLN
1.0000 mg | Freq: Once | INTRAVENOUS | Status: AC
  Administered 2024-09-12: 1 mg via INTRAVENOUS

## 2024-09-12 MED ORDER — PROPOFOL 500 MG/50ML IV EMUL
INTRAVENOUS | Status: DC | PRN
  Administered 2024-09-12: 75 ug/kg/min via INTRAVENOUS

## 2024-09-12 MED ORDER — PHENYLEPHRINE HCL-NACL 20-0.9 MG/250ML-% IV SOLN
INTRAVENOUS | Status: DC | PRN
  Administered 2024-09-12: 30 ug/min via INTRAVENOUS

## 2024-09-12 MED ORDER — METOPROLOL TARTRATE 12.5 MG HALF TABLET: 12.5000 mg | ORAL_TABLET | Freq: Two times a day (BID) | ORAL | Status: DC

## 2024-09-12 MED ORDER — METOPROLOL TARTRATE 5 MG/5ML IV SOLN
INTRAVENOUS | Status: AC
  Filled 2024-09-12: qty 5

## 2024-09-12 MED ORDER — MAGNESIUM SULFATE 2 GM/50ML IV SOLN
2.0000 g | Freq: Once | INTRAVENOUS | Status: AC
  Administered 2024-09-12: 2 g via INTRAVENOUS
  Filled 2024-09-12: qty 50

## 2024-09-12 MED ORDER — TRANEXAMIC ACID-NACL 1000-0.7 MG/100ML-% IV SOLN
1000.0000 mg | Freq: Once | INTRAVENOUS | Status: AC
  Administered 2024-09-12: 1000 mg via INTRAVENOUS
  Filled 2024-09-12: qty 100

## 2024-09-12 MED ORDER — VANCOMYCIN HCL 1000 MG IV SOLR
INTRAVENOUS | Status: AC
  Filled 2024-09-12: qty 20

## 2024-09-12 MED ORDER — SODIUM CHLORIDE 0.9 % IV SOLN
1.0000 g | INTRAVENOUS | Status: DC
  Administered 2024-09-12 – 2024-09-13 (×2): 1 g via INTRAVENOUS
  Filled 2024-09-12 (×2): qty 10

## 2024-09-12 MED ORDER — METOPROLOL TARTRATE 5 MG/5ML IV SOLN: 5.0000 mg | Freq: Four times a day (QID) | INTRAVENOUS | Status: DC | PRN

## 2024-09-12 MED ORDER — LIDOCAINE 2% (20 MG/ML) 5 ML SYRINGE
INTRAMUSCULAR | Status: DC | PRN
  Administered 2024-09-12: 40 mg via INTRAVENOUS

## 2024-09-12 MED ORDER — METOCLOPRAMIDE HCL 5 MG PO TABS: 5.0000 mg | ORAL_TABLET | Freq: Three times a day (TID) | ORAL | Status: DC | PRN

## 2024-09-12 MED ORDER — DOCUSATE SODIUM 100 MG PO CAPS
100.0000 mg | ORAL_CAPSULE | Freq: Two times a day (BID) | ORAL | Status: DC
  Administered 2024-09-13 – 2024-09-14 (×3): 100 mg via ORAL
  Filled 2024-09-12 (×4): qty 1

## 2024-09-12 MED ORDER — ROCURONIUM BROMIDE 10 MG/ML (PF) SYRINGE
PREFILLED_SYRINGE | INTRAVENOUS | Status: DC | PRN
  Administered 2024-09-12: 45 mg via INTRAVENOUS

## 2024-09-12 MED ORDER — FENTANYL CITRATE (PF) 100 MCG/2ML IJ SOLN: 12.5000 ug | INTRAMUSCULAR | Status: DC | PRN

## 2024-09-12 MED ORDER — QUETIAPINE 12.5 MG HALF TABLET
25.0000 mg | ORAL_TABLET | Freq: Two times a day (BID) | ORAL | Status: DC
  Administered 2024-09-13 – 2024-09-14 (×3): 25 mg via ORAL
  Filled 2024-09-12 (×5): qty 2

## 2024-09-12 MED ORDER — ASPIRIN 325 MG PO TABS
325.0000 mg | ORAL_TABLET | Freq: Every day | ORAL | Status: DC
  Administered 2024-09-13 – 2024-09-14 (×2): 325 mg via ORAL
  Filled 2024-09-12 (×2): qty 1

## 2024-09-12 MED ORDER — SUGAMMADEX SODIUM 200 MG/2ML IV SOLN
INTRAVENOUS | Status: DC | PRN
  Administered 2024-09-12: 100 mg via INTRAVENOUS

## 2024-09-12 MED ORDER — FAMOTIDINE 20 MG PO TABS
20.0000 mg | ORAL_TABLET | Freq: Every day | ORAL | Status: DC
  Administered 2024-09-13: 20 mg via ORAL
  Filled 2024-09-12 (×3): qty 1

## 2024-09-12 SURGICAL SUPPLY — 66 items
BAG COUNTER SPONGE SURGICOUNT (BAG) ×1 IMPLANT
BANDAGE ESMARK 6X9 LF (GAUZE/BANDAGES/DRESSINGS) ×1 IMPLANT
BIT DRILL INTERTAN LAG SCREW (BIT) IMPLANT
BIT DRILL LONG 4.0 (BIT) IMPLANT
BNDG COHESIVE 6X5 TAN ST LF (GAUZE/BANDAGES/DRESSINGS) ×1 IMPLANT
BNDG ELASTIC 4X5.8 VLCR STR LF (GAUZE/BANDAGES/DRESSINGS) ×1 IMPLANT
BNDG ELASTIC 6INX 5YD STR LF (GAUZE/BANDAGES/DRESSINGS) ×1 IMPLANT
BNDG GAUZE DERMACEA FLUFF 4 (GAUZE/BANDAGES/DRESSINGS) ×2 IMPLANT
BRUSH SCRUB EZ PLAIN DRY (MISCELLANEOUS) ×2 IMPLANT
CHLORAPREP W/TINT 26 (MISCELLANEOUS) ×1 IMPLANT
COVER PERINEAL POST (MISCELLANEOUS) ×1 IMPLANT
COVER SURGICAL LIGHT HANDLE (MISCELLANEOUS) ×2 IMPLANT
CUFF TOURN SGL QUICK 18X4 (TOURNIQUET CUFF) IMPLANT
CUFF TRNQT CYL 24X4X16.5-23 (TOURNIQUET CUFF) IMPLANT
CUFF TRNQT CYL 34X4.125X (TOURNIQUET CUFF) IMPLANT
DERMABOND ADVANCED .7 DNX12 (GAUZE/BANDAGES/DRESSINGS) ×1 IMPLANT
DRAPE C-ARM 35X43 STRL (DRAPES) ×1 IMPLANT
DRAPE C-ARM 42X72 X-RAY (DRAPES) IMPLANT
DRAPE C-ARMOR (DRAPES) ×1 IMPLANT
DRAPE IMP U-DRAPE 54X76 (DRAPES) ×2 IMPLANT
DRAPE INCISE IOBAN 66X45 STRL (DRAPES) ×1 IMPLANT
DRAPE STERI IOBAN 125X83 (DRAPES) ×1 IMPLANT
DRAPE SURG 17X23 STRL (DRAPES) ×2 IMPLANT
DRAPE SURG ORHT 6 SPLT 77X108 (DRAPES) IMPLANT
DRAPE U-SHAPE 47X51 STRL (DRAPES) ×1 IMPLANT
DRESSING MEPILEX FLEX 4X4 (GAUZE/BANDAGES/DRESSINGS) ×1 IMPLANT
DRSG ADAPTIC 3X8 NADH LF (GAUZE/BANDAGES/DRESSINGS) ×1 IMPLANT
DRSG MEPILEX POST OP 4X8 (GAUZE/BANDAGES/DRESSINGS) ×1 IMPLANT
ELECTRODE REM PT RTRN 9FT ADLT (ELECTROSURGICAL) ×1 IMPLANT
GAUZE SPONGE 4X4 12PLY STRL (GAUZE/BANDAGES/DRESSINGS) ×1 IMPLANT
GLOVE BIO SURGEON STRL SZ 6.5 (GLOVE) ×3 IMPLANT
GLOVE BIO SURGEON STRL SZ7.5 (GLOVE) ×4 IMPLANT
GLOVE BIOGEL PI IND STRL 6.5 (GLOVE) ×1 IMPLANT
GLOVE BIOGEL PI IND STRL 7.5 (GLOVE) ×1 IMPLANT
GOWN STRL REUS W/ TWL LRG LVL3 (GOWN DISPOSABLE) ×2 IMPLANT
KIT BASIN OR (CUSTOM PROCEDURE TRAY) ×1 IMPLANT
KIT TURNOVER KIT B (KITS) ×1 IMPLANT
MANIFOLD NEPTUNE II (INSTRUMENTS) ×1 IMPLANT
NAIL INTERTAN 10X18 130D 10S (Nail) IMPLANT
NDL 22X1.5 STRL (OR ONLY) (MISCELLANEOUS) IMPLANT
NEEDLE 22X1.5 STRL (OR ONLY) (MISCELLANEOUS) IMPLANT
NS IRRIG 1000ML POUR BTL (IV SOLUTION) ×1 IMPLANT
PACK GENERAL/GYN (CUSTOM PROCEDURE TRAY) ×1 IMPLANT
PACK ORTHO EXTREMITY (CUSTOM PROCEDURE TRAY) ×1 IMPLANT
PAD ARMBOARD POSITIONER FOAM (MISCELLANEOUS) ×2 IMPLANT
PADDING CAST COTTON 6X4 STRL (CAST SUPPLIES) ×3 IMPLANT
PIN GUIDE 3.2X343MM (PIN) IMPLANT
SCREW LAG COMPR KIT 85/80 (Screw) IMPLANT
SPONGE T-LAP 18X18 ~~LOC~~+RFID (SPONGE) ×1 IMPLANT
STAPLER SKIN PROX 35W (STAPLE) IMPLANT
STOCKINETTE IMPERVIOUS LG (DRAPES) ×1 IMPLANT
STRIP CLOSURE SKIN 1/2X4 (GAUZE/BANDAGES/DRESSINGS) IMPLANT
SUCTION TUBE FRAZIER 10FR DISP (SUCTIONS) IMPLANT
SUT ETHILON 3 0 PS 1 (SUTURE) IMPLANT
SUT MNCRL AB 3-0 PS2 18 (SUTURE) ×1 IMPLANT
SUT MON AB 2-0 CT1 36 (SUTURE) ×1 IMPLANT
SUT PDS AB 2-0 CT1 27 (SUTURE) IMPLANT
SUT VIC AB 0 CT1 27XBRD ANBCTR (SUTURE) IMPLANT
SUT VIC AB 2-0 CT1 TAPERPNT 27 (SUTURE) ×2 IMPLANT
SYR CONTROL 10ML LL (SYRINGE) IMPLANT
TOWEL GREEN STERILE (TOWEL DISPOSABLE) ×2 IMPLANT
TOWEL GREEN STERILE FF (TOWEL DISPOSABLE) ×2 IMPLANT
TUBE CONNECTING 12X1/4 (SUCTIONS) ×1 IMPLANT
UNDERPAD 30X36 HEAVY ABSORB (UNDERPADS AND DIAPERS) ×1 IMPLANT
WATER STERILE IRR 1000ML POUR (IV SOLUTION) ×2 IMPLANT
YANKAUER SUCT BULB TIP NO VENT (SUCTIONS) ×1 IMPLANT

## 2024-09-12 NOTE — Op Note (Signed)
 Orthopaedic Surgery Operative Note (CSN: [id number] ) Date of Surgery: 09/12/2024  Admit Date: 09/11/2024   Diagnoses: Pre-Op Diagnoses: Left periprosthetic proximal femur fracture  Post-Op Diagnosis: Same  Procedures: CPT 27245-Cephalomedullary nailing of left intertrochanteric femur fracture CPT 20680-Removal of hardware left femur  Surgeons : Primary: Kendal Franky SQUIBB, MD  Assistant: Lauraine Moores, PA-C  Location: OR 3   Anesthesia: General   Antibiotics: Ancef  2g preop   Tourniquet time: None    Estimated Blood Loss: 30 mL  Complications:* No complications entered in OR log *   Specimens:* No specimens in log *   Implants: Implant Name Type Inv. Item Serial No. Manufacturer Lot No. LRB No. Used Action  NAIL INTERTAN 10X18 130D 10S - [id number] Nail NAIL INTERTAN 10X18 130D 10S  [employer] [id number] Left 1 Implanted  SCREW LAG COMPR KIT 85/80 - [id number] Screw SCREW LAG COMPR KIT 85/80  [employer] [id number] Left 1 Implanted     Indications for Surgery: 89 year old female who sustained a ground-level fall with a left nondisplaced intertrochanteric femur fracture above a previous distal femur plate.  Due to the unstable nature of her injury I recommend proceeding with cephalomedullary nailing of the left hip with removal of the part of the distal femur plate.  Risks and benefits were discussed with the patient's daughter.  Risks include but not limited to bleeding, infection, malunion, nonunion, hardware failure, hardware irritation, nerve blood vessel injury, DVT, and the possibility anesthetic complications.  They agreed to proceed with surgery and consent was obtained.  Operative Findings: 1.  Removal of proximal two screws from the distal femur plate for intramedullary placement of intertrochanteric femur nail 2.  Cephalomedullary nailing of left intertrochanteric femur fracture using [employer] InterTAN 10 x 180 mm nail with a  85 mm lag screw  Procedure: The patient was identified in the preoperative holding area. Consent was confirmed with the patient and their family and all questions were answered. The operative extremity was marked after confirmation with the patient. she was then brought back to the operating room by our anesthesia colleagues.  She was placed under general anesthetic and carefully transferred over to radiolucent flattop table.  A bump was placed under her operative hip.  The left lower extremity was then prepped and draped in usual sterile fashion.  A timeout was performed to verify the patient, the procedure, and the extremity.  Preoperative antibiotics were dosed.  Fluoroscopic imaging showed the unstable nature of her injury.  I opened up the percutaneous incisions to remove the proximal screw from the distal femur plate.  I then proceeded to make an incision proximal to the greater trochanter and directed a threaded guidewire to the tip of the greater trochanter into the proximal metaphysis.  I confirmed adequate placement with fluoroscopy and then used an entry reamer to enter the medullary canal.  I then passed a 10 x 180 mm nail down the central canal and fortunately it did abut the most proximal screw in the femoral shaft.  I removed the nail and proceeded to remove the locking cap and 5.0 millimeter screw.  I then replaced the nail and aligned appropriately.  I then directed a threaded guidewire up into the head/neck segment.  I confirmed adequate tip apex distance and measured the length and decided to use an 85 mm lag screw.  I then drilled the path for the compression screw and placed an antirotation bar.  I then drilled the path for  the lag screw and placed the lag screw.  I then compressed with compression screw approximately 5 mm.  I statically locked the proximal portion of the nail.  An attempt was made to place a distal interlocking screw from lateral to medial but unfortunately the plate was  in the way.  I did feel I had rotational control due to the proximal fracture and felt that I did not need a distal interlocking screw.  Final fluoroscopic imaging was obtained.  The incisions were copiously irrigated.  A gram vancomycin  powder was placed into the incision.  A layered closure of 0 Vicryl, 2-0 Monocryl and Dermabond.  Sterile dressings were applied.  The patient was then awoke from anesthesia and taken to the PACU in stable condition.  Post Op Plan/Instructions: Patient will be weightbearing as tolerated to the left lower extremity.  She received postoperative Ancef .  She will receive aspirin  for DVT prophylaxis.  Will have her mobilize with physical and Occupational Therapy.  I was present and performed the entire surgery.  Lauraine Moores, PA-C did assist me throughout the case. An assistant was necessary given the difficulty in approach, maintenance of reduction and ability to instrument the fracture.   Franky Light, MD [facility]

## 2024-09-12 NOTE — Progress Notes (Signed)
 OT Cancellation Note  Patient Details Name: NOHEMY KOOP MRN: [id number] DOB: 03-10-1921   Cancelled Treatment:    Reason Eval/Treat Not Completed: Patient at procedure or test/ unavailable. Noting pt in surgery this morning. Will follow up as schedule allows and pt medically ready.   Elma JONETTA Lebron FREDERICK, OTR/L [facility] Office: [phone]   Elma JONETTA Lebron 09/12/2024, 7:29 AM

## 2024-09-12 NOTE — Plan of Care (Signed)

## 2024-09-12 NOTE — Transfer of Care (Signed)
 Immediate Anesthesia Transfer of Care Note  Patient: [name] Barker  Procedure(s) Performed: FIXATION, FRACTURE, INTERTROCHANTERIC, WITH INTRAMEDULLARY ROD (Left: Leg Upper) REMOVAL, HARDWARE (Left: Leg Upper)  Patient Location: PACU  Anesthesia Type:General  Level of Consciousness: drowsy  Airway & Oxygen Therapy: Patient Spontanous Breathing and Patient connected to face mask oxygen  Post-op Assessment: Report given to RN and Post -op Vital signs reviewed and stable  Post vital signs: Reviewed and stable  Last Vitals:  Vitals Value Taken Time  BP 128/73 09/12/24 10:50  Temp    Pulse 76 09/12/24 10:57  Resp 13 09/12/24 10:57  SpO2 100 % 09/12/24 10:57  Vitals shown include unfiled device data.  Last Pain:  Vitals:   09/12/24 0725  TempSrc: Oral  PainSc:          Complications: No notable events documented.

## 2024-09-12 NOTE — Care Plan (Signed)
 This 89 years old female with PMH significant for Alzheimer's dementia, intermittent agitation, history of recurrent falls, CVA, history of irregular heartbeat without diagnosis of arrhythmias, Iron deficiency anemia, remote history of colonic adenocarcinoma status post partial colectomy, adjuvant chemotherapy, hyperlipidemia presented status post mechanical fall with left femoral neck and intertrochanteric fracture.  Patient was admitted for further evaluation. Orthopedics is consulted.  Patient is status post ORIF.  POD #1. Patient seen and examined at bed side,  reports has a lot of pain.

## 2024-09-12 NOTE — Consult Note (Signed)
 Orthopaedic Trauma Service (OTS) Consult   Patient ID: [name] Barker MRN: [id number] DOB/AGE: 1921-10-14 89 y.o.  Reason for Consult:Left intertrochanteric femur fracture Referring Physician: Dr. Juliene Bicker, DO [facility]  HPI: [name] Barker is an 89 y.o. female who is being seen in consultation at the request of Dr. Bicker for evaluation of left periprosthetic proximal femur fracture.  Patient is known to me from previous open reduction internal fixation of the distal femur in 2019.  She sustained a fall with a left hip fracture above.  She had a nondisplaced intertrochanteric femur fracture identified on x-rays.  Due to her previous relationship with me I was contacted for definitive management.  Patient's daughter is at bedside.  Patient has pretty significant dementia.  But she has been ambulatory with a rolling walker.  She has been walking up and down the driveway her.  She lives with her 30 year old husband as well as her daughter.  Denies any other issues of note today.  Past Medical History:  Diagnosis Date   Allergic rhinitis    ANEMIA-NOS 11/17/2006   Colon cancer (HCC) 08/19/2007   1994 T2, 2008 T3, N1   Colon cancer (HCC)    COVID-19    DIVERTICULOSIS-COLON 05/30/2010   DNR (do not resuscitate)    Dyslipidemia    Femur fracture, left (HCC) 09/13/2018   CLOSED   GERD 11/17/2006   Hemorrhoids    History of CVA (cerebrovascular accident)    HYPERLIPIDEMIA 11/17/2006   LOW BACK PAIN, CHRONIC 02/04/2010   Osteopenia 11/17/2006   Small bowel obstruction (HCC)    TIA (transient ischemic attack)     Past Surgical History:  Procedure Laterality Date   ABDOMINAL HYSTERECTOMY     APPENDECTOMY     BACK SURGERY  12/29/2005   BELPHAROPTOSIS REPAIR     CATARACT EXTRACTION     HEMICOLECTOMY  12/29/1992   Right   INTRAMEDULLARY (IM) NAIL INTERTROCHANTERIC Right 01/27/2021   Procedure: INTRAMEDULLARY (IM) NAIL INTERTROCHANTRIC;  Surgeon: Ernie Cough, MD;   Location: [facility] OR;  Service: Orthopedics;  Laterality: Right;   JOINT REPLACEMENT     KNEE ARTHROSCOPY Left    ORIF FEMUR FRACTURE Left 09/13/2018   Procedure: OPEN REDUCTION INTERNAL FIXATION (ORIF) DISTAL FEMUR FRACTURE;  Surgeon: Kendal [name] SQUIBB, MD;  Location: [facility] OR;  Service: Orthopedics;  Laterality: Left;   PARTIAL COLECTOMY  12/29/2006   ROTATOR CUFF REPAIR     SHOULDER SURGERY     VAGINAL PROLAPSE REPAIR      Family History  Problem Relation Age of Onset   Lung cancer Sister    Colon cancer Sister 65   Kidney cancer Sister     Social History:  reports that she has never smoked. She has never used smokeless tobacco. She reports that she does not currently use alcohol. She reports that she does not use drugs.  Allergies:  Allergies  Allergen Reactions   Morphine  And Codeine Other (See Comments)    Blisters at site and along vein   Flexeril  [Cyclobenzaprine ] Other (See Comments)    Nightmares    Methenamine Mandelate Other (See Comments)    Burning sensation   Penicillins Hives    Did it involve swelling of the face/tongue/throat, SOB, or low BP? Yes Did it involve sudden or severe rash/hives, skin peeling, or any reaction on the inside of your mouth or nose? Unk Did you need to seek medical attention at a hospital or doctor's office? Unk When did it last happen?  Childhood If all above answers are "NO", may proceed with cephalosporin use.    Promethazine Hcl Other (See Comments)    Hallucinations   Statins Other (See Comments)    Muscle pain    Ativan  [Lorazepam ] Anxiety   Morphine  Sulfate Rash and Other (See Comments)    Blisters all over body    Medications:  No current facility-administered medications on file prior to encounter.   Current Outpatient Medications on File Prior to Encounter  Medication Sig Dispense Refill   acetaminophen  (TYLENOL ) 500 MG tablet Take 500 mg by mouth 2 (two) times a day. FOR BACK PAIN     albuterol  (VENTOLIN  HFA) 108 (90 Base)  MCG/ACT inhaler INHALE 2 PUFFS BY MOUTH EVERY 6 HOURS AS NEEDED FOR WHEEZING OR FOR SHORTNESS OF BREATH 18 g 3   amoxicillin -clavulanate (AUGMENTIN ) 875-125 MG tablet Take 1 tablet by mouth 2 (two) times daily. 20 tablet 3   busPIRone  (BUSPAR ) 5 MG tablet Take 1 tablet (5 mg total) by mouth 2 (two) times daily as needed. 60 tablet 3   citalopram  (CELEXA ) 10 MG tablet Take 1 tablet (10 mg total) by mouth daily. 90 tablet 3   Cranberry 500 MG TABS Take 500 mg by mouth in the morning and at bedtime.     famotidine  (PEPCID ) 20 MG tablet Take 1 tablet (20 mg total) by mouth at bedtime. 90 tablet 3   fexofenadine (ALLEGRA) 180 MG tablet Take 180 mg by mouth in the morning.     fluticasone  (FLONASE ) 50 MCG/ACT nasal spray Place 1 spray into both nostrils in the morning and at bedtime.     QUEtiapine  (SEROQUEL ) 25 MG tablet Take 0.5-1 tablets (12.5-25 mg total) by mouth 2 (two) times daily as needed (agitation). 60 tablet 0   traZODone  (DESYREL ) 50 MG tablet Take 1 tablet (50 mg total) by mouth at bedtime. 90 tablet 3   [DISCONTINUED] pantoprazole  (PROTONIX ) 40 MG tablet Take 1 tablet (40 mg total) by mouth daily. (Patient not taking: Reported on 02/05/2021) 30 tablet 0     ROS: Not able to be obtained due to dementia  Exam: Blood pressure (!) 149/70, pulse 64, temperature 98.2 F (36.8 C), temperature source Oral, resp. rate 18, height 5' 1 (1.549 m), weight 42.3 kg, SpO2 93%. General: No acute distress Orientation: Awake and alert but not oriented Mood and Affect: Unable to fully assess due to dementia Gait: Unable to assess Coordination and balance: Unable to assess  Left lower extremity: Skin is in place without significant damage.  Previous surgical incisions to the distal femur are without any signs of any erythema and healed fully.  Compartments are soft compressible.  She is warm well-perfused foot she is able to move her toes but does not really follow any commands.  Right lower extremity  skin without lesions. No tenderness to palpation. Full painless ROM, full strength in each muscle groups without evidence of instability.   Medical Decision Making: Data: Imaging: X-rays and CT scan are reviewed which shows a nondisplaced intertrochanteric femur fracture with previous surgical fixation and plate of the femoral shaft with no signs of any hardware failure or loosening.  Labs:  Results for orders placed or performed during the hospital encounter of 09/11/24 (from the past 24 hours)  CBC with Differential     Status: Abnormal   Collection Time: 09/11/24  3:45 PM  Result Value Ref Range   WBC 9.2 4.0 - 10.5 K/uL   RBC 3.74 (L) 3.87 - 5.11  MIL/uL   Hemoglobin 10.7 (L) 12.0 - 15.0 g/dL   HCT 66.7 (L) 63.9 - 53.9 %   MCV 88.8 80.0 - 100.0 fL   MCH 28.6 26.0 - 34.0 pg   MCHC 32.2 30.0 - 36.0 g/dL   RDW 84.6 88.4 - 84.4 %   Platelets 220 150 - 400 K/uL   nRBC 0.0 0.0 - 0.2 %   Neutrophils Relative % 71 %   Neutro Abs 6.4 1.7 - 7.7 K/uL   Lymphocytes Relative 20 %   Lymphs Abs 1.8 0.7 - 4.0 K/uL   Monocytes Relative 8 %   Monocytes Absolute 0.8 0.1 - 1.0 K/uL   Eosinophils Relative 1 %   Eosinophils Absolute 0.1 0.0 - 0.5 K/uL   Basophils Relative 0 %   Basophils Absolute 0.0 0.0 - 0.1 K/uL   Immature Granulocytes 0 %   Abs Immature Granulocytes 0.03 0.00 - 0.07 K/uL  Comprehensive metabolic panel     Status: Abnormal   Collection Time: 09/11/24  3:45 PM  Result Value Ref Range   Sodium 135 135 - 145 mmol/L   Potassium 4.1 3.5 - 5.1 mmol/L   Chloride 100 98 - 111 mmol/L   CO2 24 22 - 32 mmol/L   Glucose, Bld 108 (H) 70 - 99 mg/dL   BUN 22 8 - 23 mg/dL   Creatinine, Ser 9.33 0.44 - 1.00 mg/dL   Calcium 9.3 8.9 - 89.6 mg/dL   Total Protein 7.5 6.5 - 8.1 g/dL   Albumin 3.9 3.5 - 5.0 g/dL   AST 20 15 - 41 U/L   ALT 8 0 - 44 U/L   Alkaline Phosphatase 74 38 - 126 U/L   Total Bilirubin 0.6 0.0 - 1.2 mg/dL   GFR, Estimated >39 >39 mL/min   Anion gap 12 5 - 15   Lipase, blood     Status: None   Collection Time: 09/11/24  3:45 PM  Result Value Ref Range   Lipase 24 11 - 51 U/L  Urinalysis, Routine w reflex microscopic -Urine, Clean Catch     Status: Abnormal   Collection Time: 09/11/24  3:45 PM  Result Value Ref Range   Color, Urine YELLOW YELLOW   APPearance HAZY (A) CLEAR   Specific Gravity, Urine 1.016 1.005 - 1.030   pH 6.0 5.0 - 8.0   Glucose, UA NEGATIVE NEGATIVE mg/dL   Hgb urine dipstick SMALL (A) NEGATIVE   Bilirubin Urine NEGATIVE NEGATIVE   Ketones, ur TRACE (A) NEGATIVE mg/dL   Protein, ur TRACE (A) NEGATIVE mg/dL   Nitrite POSITIVE (A) NEGATIVE   Leukocytes,Ua LARGE (A) NEGATIVE   RBC / HPF 0-5 0 - 5 RBC/hpf   WBC, UA >50 0 - 5 WBC/hpf   Bacteria, UA RARE (A) NONE SEEN   Squamous Epithelial / HPF 0-5 0 - 5 /HPF   Mucus PRESENT   Surgical PCR screen     Status: None   Collection Time: 09/12/24  1:55 AM   Specimen: Nasal Mucosa; Nasal Swab  Result Value Ref Range   MRSA, PCR NEGATIVE NEGATIVE   Staphylococcus aureus NEGATIVE NEGATIVE  Basic metabolic panel with GFR     Status: Abnormal   Collection Time: 09/12/24  3:16 AM  Result Value Ref Range   Sodium 134 (L) 135 - 145 mmol/L   Potassium 3.6 3.5 - 5.1 mmol/L   Chloride 99 98 - 111 mmol/L   CO2 24 22 - 32 mmol/L   Glucose, Bld 113 (H)  70 - 99 mg/dL   BUN 16 8 - 23 mg/dL   Creatinine, Ser 9.28 0.44 - 1.00 mg/dL   Calcium 8.4 (L) 8.9 - 10.3 mg/dL   GFR, Estimated >39 >39 mL/min   Anion gap 11 5 - 15  CBC     Status: Abnormal   Collection Time: 09/12/24  3:16 AM  Result Value Ref Range   WBC 9.7 4.0 - 10.5 K/uL   RBC 3.56 (L) 3.87 - 5.11 MIL/uL   Hemoglobin 10.2 (L) 12.0 - 15.0 g/dL   HCT 68.1 (L) 63.9 - 53.9 %   MCV 89.3 80.0 - 100.0 fL   MCH 28.7 26.0 - 34.0 pg   MCHC 32.1 30.0 - 36.0 g/dL   RDW 84.6 88.4 - 84.4 %   Platelets 207 150 - 400 K/uL   nRBC 0.0 0.0 - 0.2 %  Magnesium      Status: Abnormal   Collection Time: 09/12/24  3:16 AM  Result Value Ref  Range   Magnesium  1.6 (L) 1.7 - 2.4 mg/dL  Phosphorus     Status: None   Collection Time: 09/12/24  3:16 AM  Result Value Ref Range   Phosphorus 2.6 2.5 - 4.6 mg/dL  Protime-INR     Status: Abnormal   Collection Time: 09/12/24  3:16 AM  Result Value Ref Range   Prothrombin Time 15.4 (H) 11.4 - 15.2 seconds   INR 1.2 0.8 - 1.2  Type and screen [facility]     Status: None (Preliminary result)   Collection Time: 09/12/24  8:08 AM  Result Value Ref Range   ABO/RH(D) PENDING    Antibody Screen PENDING    Sample Expiration      09/15/2024,2359 Performed at [facility], [address], [place], [place] [address]      Imaging or Labs ordered: None  Medical history and chart was reviewed and case discussed with medical provider.  Assessment/Plan: 89 year old with periprosthetic proximal femur fracture.  Due to her age and mobility level I recommend proceeding with Seph medullary nailing of the left hip.  Will likely need to remove one of the distal interlocking screws from the distal femur plate.  Risks and benefits were discussed with the patient's daughter.  Risks include but not limited to bleeding, infection, hardware failure, hardware irritation, nerve and blood vessel injury, DVT, in the past anesthetic complications.  They agreed to proceed with surgery and consent was obtained.  [name] MYRTIS Light, MD [facility] [phone] (office) [email or web address]

## 2024-09-12 NOTE — Anesthesia Procedure Notes (Signed)
 Procedure Name: Intubation Date/Time: 09/12/2024 9:37 AM  Performed by: Lamar Lucie DASEN, CRNAPre-anesthesia Checklist: Patient identified, Emergency Drugs available, Suction available and Patient being monitored Patient Re-evaluated:Patient Re-evaluated prior to induction Oxygen Delivery Method: Circle system utilized Preoxygenation: Pre-oxygenation with 100% oxygen Induction Type: IV induction Ventilation: Mask ventilation without difficulty Laryngoscope Size: Mac and 3 Grade View: Grade I Tube type: Oral Tube size: 6.5 mm Number of attempts: 1 Airway Equipment and Method: Stylet Placement Confirmation: ETT inserted through vocal cords under direct vision, positive ETCO2 and breath sounds checked- equal and bilateral Secured at: 20 cm Tube secured with: Tape Dental Injury: Teeth and Oropharynx as per pre-operative assessment

## 2024-09-12 NOTE — Interval H&P Note (Signed)
 History and Physical Interval Note:  09/12/2024 8:49 AM  [name] Barker  has presented today for surgery, with the diagnosis of Left intertrochanteric femur fracture.  The various methods of treatment have been discussed with the patient and family. After consideration of risks, benefits and other options for treatment, the patient has consented to  Procedure(s): FIXATION, FRACTURE, INTERTROCHANTERIC, WITH INTRAMEDULLARY ROD (Left) REMOVAL, HARDWARE (Left) as a surgical intervention.  The patient's history has been reviewed, patient examined, no change in status, stable for surgery.  I have reviewed the patient's chart and labs.  Questions were answered to the patient's satisfaction.     [name] P Shamia Uppal

## 2024-09-12 NOTE — Anesthesia Postprocedure Evaluation (Addendum)
 Anesthesia Post Note  Patient: [name] Barker  Procedure(s) Performed: FIXATION, FRACTURE, INTERTROCHANTERIC, WITH INTRAMEDULLARY ROD (Left: Leg Upper) REMOVAL, HARDWARE (Left: Leg Upper)     Patient location during evaluation: PACU Anesthesia Type: General Level of consciousness: patient cooperative (LOC at baseline) Pain management: pain level controlled Vital Signs Assessment: post-procedure vital signs reviewed and stable Respiratory status: spontaneous breathing, nonlabored ventilation, respiratory function stable and patient connected to nasal cannula oxygen Cardiovascular status: blood pressure returned to baseline and stable Postop Assessment: no apparent nausea or vomiting Anesthetic complications: no Comments: Patient assessed at bedside. Mental status at baseline - minimal response, good cough, maintaining airway, intentional movement, not agitated. Remains in atrial fibrillation - received IV metoprolol  for additional rate control in PACU. No additional medications given. Stable for transfer to the floor.   No notable events documented.  Last Vitals:  Vitals:   09/12/24 1050 09/12/24 1100  BP: 128/73 121/61  Pulse: 68 65  Resp: 13 14  Temp:    SpO2: 100% 100%    Last Pain:  Vitals:   09/12/24 0725  TempSrc: Oral  PainSc:                  Lauraine KATHEE [place]

## 2024-09-12 NOTE — H&P (Addendum)
 History and Physical    [name] Barker FMW:[id number] DOB: 1921-08-11 DOA: 09/11/2024  PCP: Rollene Almarie LABOR, MD   Patient coming from: [facility] ED   Chief Complaint: L femoral fracture    HPI: Hx limited by dementia  [name] Barker is a 89 y.o. female with hx of alzheimers dementia with agitation, hx recurrent falls, CVA, ? Hx irregular heartbeat without dx arrhythmia, CAD by imaging, Hx IDA,  remote hx colon adenocarcinoma s/p partial colectomy, adjuvant chemotherapy , HLD, who is transferred from [facility] ED for L femoral neck and intertrochanteric fracture.   Per ED:   Talked with the family on the phone where she had a witnessed fall from a low bed to the floor.  Seems like she has not been able to really ambulate since the fall yesterday.  Seems to be guarding her left hip per family.  Seems like that is where she is mostly tender with EMS as well.  She had normal vitals.  She is DNR.  She lives at home with family.  She has wheelchair or walker there.  Family helps her.  She is able to normally ambulate pretty well with a walker but has not been able to since the fall.  She has had prior injuries to both the left and the right hip.   Patient is not able to provide any history due to dementia. She is awake and alert to me but does not answer my questions, she appears comfortable.   Review of Systems:  ROS unable to complete due to dementia   Allergies  Allergen Reactions   Morphine  And Codeine Other (See Comments)    Blisters at site and along vein   Flexeril  [Cyclobenzaprine ] Other (See Comments)    Nightmares    Methenamine Mandelate Other (See Comments)    Burning sensation   Penicillins Hives    Did it involve swelling of the face/tongue/throat, SOB, or low BP? Yes Did it involve sudden or severe rash/hives, skin peeling, or any reaction on the inside of your mouth or nose? Unk Did you need to seek medical attention at a hospital or doctor's office? Unk When  did it last happen? Childhood If all above answers are "NO", may proceed with cephalosporin use.    Promethazine Hcl Other (See Comments)    Hallucinations   Statins Other (See Comments)    Muscle pain    Ativan  [Lorazepam ] Anxiety   Morphine  Sulfate Rash and Other (See Comments)    Blisters all over body    Prior to Admission medications   Medication Sig Start Date End Date Taking? Authorizing Provider  acetaminophen  (TYLENOL ) 500 MG tablet Take 500 mg by mouth 2 (two) times a day. FOR BACK PAIN    [provider]  albuterol  (VENTOLIN  HFA) 108 (90 Base) MCG/ACT inhaler INHALE 2 PUFFS BY MOUTH EVERY 6 HOURS AS NEEDED FOR WHEEZING OR FOR SHORTNESS OF BREATH 07/26/24   Rollene Almarie LABOR, MD  amoxicillin -clavulanate (AUGMENTIN ) 875-125 MG tablet Take 1 tablet by mouth 2 (two) times daily. 01/25/24   Rollene Almarie LABOR, MD  busPIRone  (BUSPAR ) 5 MG tablet Take 1 tablet (5 mg total) by mouth 2 (two) times daily as needed. 09/08/24   Rollene Almarie LABOR, MD  citalopram  (CELEXA ) 10 MG tablet Take 1 tablet (10 mg total) by mouth daily. 12/28/23   Rollene Almarie LABOR, MD  Cranberry 500 MG TABS Take 500 mg by mouth in the morning and at bedtime.    [provider]  famotidine  (PEPCID ) 20 MG tablet Take 1 tablet (20 mg total) by mouth at bedtime. 01/25/24   Rollene Almarie LABOR, MD  fexofenadine (ALLEGRA) 180 MG tablet Take 180 mg by mouth in the morning.    [provider]  fluticasone  (FLONASE ) 50 MCG/ACT nasal spray Place 1 spray into both nostrils in the morning and at bedtime.    [provider]  QUEtiapine  (SEROQUEL ) 25 MG tablet Take 0.5-1 tablets (12.5-25 mg total) by mouth 2 (two) times daily as needed (agitation). 08/31/24   Rollene Almarie LABOR, MD  traZODone  (DESYREL ) 50 MG tablet Take 1 tablet (50 mg total) by mouth at bedtime. 08/31/24   Rollene Almarie LABOR, MD  pantoprazole  (PROTONIX ) 40 MG tablet Take 1 tablet (40 mg total) by mouth  daily. Patient not taking: Reported on 02/05/2021 01/30/21 02/05/21  Dennise Lavada POUR, MD    Past Medical History:  Diagnosis Date   Allergic rhinitis    ANEMIA-NOS 11/17/2006   Colon cancer (HCC) 08/19/2007   1994 T2, 2008 T3, N1   Colon cancer (HCC)    COVID-19    DIVERTICULOSIS-COLON 05/30/2010   DNR (do not resuscitate)    Dyslipidemia    Femur fracture, left (HCC) 09/13/2018   CLOSED   GERD 11/17/2006   Hemorrhoids    History of CVA (cerebrovascular accident)    HYPERLIPIDEMIA 11/17/2006   LOW BACK PAIN, CHRONIC 02/04/2010   Osteopenia 11/17/2006   Small bowel obstruction (HCC)    TIA (transient ischemic attack)     Past Surgical History:  Procedure Laterality Date   ABDOMINAL HYSTERECTOMY     APPENDECTOMY     BACK SURGERY  2007   BELPHAROPTOSIS REPAIR     CATARACT EXTRACTION     HEMICOLECTOMY  1994   Right   INTRAMEDULLARY (IM) NAIL INTERTROCHANTERIC Right 01/27/2021   Procedure: INTRAMEDULLARY (IM) NAIL INTERTROCHANTRIC;  Surgeon: Ernie Cough, MD;  Location: [facility] OR;  Service: Orthopedics;  Laterality: Right;   JOINT REPLACEMENT     KNEE ARTHROSCOPY     ORIF FEMUR FRACTURE Left 09/13/2018   Procedure: OPEN REDUCTION INTERNAL FIXATION (ORIF) DISTAL FEMUR FRACTURE;  Surgeon: Kendal Franky SQUIBB, MD;  Location: [facility] OR;  Service: Orthopedics;  Laterality: Left;   PARTIAL COLECTOMY  2008   ROTATOR CUFF REPAIR     SHOULDER SURGERY     VAGINAL PROLAPSE REPAIR       reports that she has never smoked. She has never used smokeless tobacco. She reports that she does not currently use alcohol. She reports that she does not use drugs.  Family History  Problem Relation Age of Onset   Lung cancer Sister    Colon cancer Sister 51   Kidney cancer Sister      Physical Exam: Vitals:   09/11/24 1515 09/11/24 1522 09/11/24 2013 09/11/24 2224  BP: (!) 150/75   (!) 162/90  Pulse: 100   (!) 105  Resp: 18   20  Temp: 98 F (36.7 C)  98 F (36.7 C) 99 F (37.2 C)  TempSrc: Axillary   Axillary Axillary  SpO2: 100%   94%  Weight:  45.8 kg  42.3 kg    Gen: Awake, alert, chronically ill appearing  CV: Irregular, tachycardic, normal S1, S2, 1/6 SEM  Resp: Normal WOB, CTAB  Abd: Flat, normoactive, nontender MSK: LLE slightly shortened, NV intact distally   Skin: No rashes or lesions to exposed skin  Neuro: Alert but does not give verbal response.  Psych: Encephalopathic,  likely chronic    Data review:   Labs reviewed, notable for:   Chemistries unremarkable  Hb 10.7  UA concerning for infection   Micro:  Results for orders placed or performed during the hospital encounter of 05/11/21  Resp Panel by RT-PCR (Flu A&B, Covid) Nasopharyngeal Swab     Status: None   Collection Time: 05/11/21 12:46 PM   Specimen: Nasopharyngeal Swab; Nasopharyngeal(NP) swabs in vial transport medium  Result Value Ref Range Status   SARS Coronavirus 2 by RT PCR NEGATIVE NEGATIVE Final    Comment: (NOTE) SARS-CoV-2 target nucleic acids are NOT DETECTED.  The SARS-CoV-2 RNA is generally detectable in upper respiratory specimens during the acute phase of infection. The lowest concentration of SARS-CoV-2 viral copies this assay can detect is 138 copies/mL. A negative result does not preclude SARS-Cov-2 infection and should not be used as the sole basis for treatment or other patient management decisions. A negative result may occur with  improper specimen collection/handling, submission of specimen other than nasopharyngeal swab, presence of viral mutation(s) within the areas targeted by this assay, and inadequate number of viral copies(<138 copies/mL). A negative result must be combined with clinical observations, patient history, and epidemiological information. The expected result is Negative.  Fact Sheet for Patients:  BloggerCourse.com  Fact Sheet for Healthcare Providers:  SeriousBroker.it  This test is no t yet approved or  cleared by the [place]  FDA and  has been authorized for detection and/or diagnosis of SARS-CoV-2 by FDA under an Emergency Use Authorization (EUA). This EUA will remain  in effect (meaning this test can be used) for the duration of the COVID-19 declaration under Section 564(b)(1) of the Act, 21 U.S.C.section 360bbb-3(b)(1), unless the authorization is terminated  or revoked sooner.       Influenza A by PCR NEGATIVE NEGATIVE Final   Influenza B by PCR NEGATIVE NEGATIVE Final    Comment: (NOTE) The Xpert Xpress SARS-CoV-2/FLU/RSV plus assay is intended as an aid in the diagnosis of influenza from Nasopharyngeal swab specimens and should not be used as a sole basis for treatment. Nasal washings and aspirates are unacceptable for Xpert Xpress SARS-CoV-2/FLU/RSV testing.  Fact Sheet for Patients: BloggerCourse.com  Fact Sheet for Healthcare Providers: SeriousBroker.it  This test is not yet approved or cleared by the [place]  FDA and has been authorized for detection and/or diagnosis of SARS-CoV-2 by FDA under an Emergency Use Authorization (EUA). This EUA will remain in effect (meaning this test can be used) for the duration of the COVID-19 declaration under Section 564(b)(1) of the Act, 21 U.S.C. section 360bbb-3(b)(1), unless the authorization is terminated or revoked.  Performed at [facility], [address] [place], [place] [address]     Imaging reviewed:  DG Femur Min 2 Views Left Result Date: 09/11/2024 CLINICAL DATA:  Clemens.  Left hip pain. EXAM: LEFT FEMUR 2 VIEWS COMPARISON:  Prior radiographs from 2025 FINDINGS: Long lateral sideplate and compression screws related to a prior distal femur fracture. No complicating features. The left hip is normally located. Nondisplaced fractures. Nondisplaced fractures of the lower femoral neck and also the intertrochanteric region. IMPRESSION: 1. Lower left femoral neck  and intertrochanteric fractures, nondisplaced. 2. Remote posttraumatic and postsurgical changes involving the distal femur. Electronically Signed   By: MYRTIS Stammer M.D.   On: 09/11/2024 17:23   DG Hip Unilat With Pelvis 2-3 Views Left Result Date: 09/11/2024 CLINICAL DATA:  Clemens.  Left hip pain. EXAM: DG HIP (WITH OR WITHOUT PELVIS)  2-3V LEFT COMPARISON:  Radiographs from 2022 FINDINGS: Right hip hardware intact. No complicating features. The left hip is normally located. Nondisplaced intertrochanteric fracture. No definite pelvic fractures. IMPRESSION: Nondisplaced intertrochanteric fracture of the left hip. Electronically Signed   By: MYRTIS Stammer M.D.   On: 09/11/2024 17:22   CT Lumbar Spine Wo Contrast Result Date: 09/11/2024 CLINICAL DATA:  Unwitnessed fall.  Back pain. EXAM: CT LUMBAR SPINE WITHOUT CONTRAST TECHNIQUE: Multidetector CT imaging of the lumbar spine was performed without intravenous contrast administration. Multiplanar CT image reconstructions were also generated. RADIATION DOSE REDUCTION: This exam was performed according to the departmental dose-optimization program which includes automated exposure control, adjustment of the mA and/or kV according to patient size and/or use of iterative reconstruction technique. COMPARISON:  Prior CT scan from 2015 FINDINGS: Segmentation: Could There are five lumbar type vertebral bodies. The last full intervertebral disc space is labeled L5-S1. Alignment: Significant left convex scoliosis but normal overall alignment in the sagittal plane. Vertebrae: Stable fusion hardware at L2-3. No complicating features. No acute lumbar spine fracture. No upper pelvic fractures. There is a displaced fracture involving the right twelfth rib. No transverse process fractures. Paraspinal and other soft tissues: No significant paraspinal findings. Advanced vascular disease but no aneurysm. Severe colonic diverticulosis. Disc levels: No large disc protrusions or  significant canal stenosis. IMPRESSION: 1. Displaced fracture involving the posterior right twelfth rib. 2. No acute lumbar spine fracture. 3. Stable fusion hardware at L2-3. 4. Significant left convex scoliosis. 5. No large disc protrusions or significant canal stenosis. 6. Aortic atherosclerosis. Aortic Atherosclerosis (ICD10-I70.0). Electronically Signed   By: MYRTIS Stammer M.D.   On: 09/11/2024 17:20   CT PELVIS WO CONTRAST Result Date: 09/11/2024 CLINICAL DATA:  Clemens.  Left hip pain. EXAM: CT PELVIS WITHOUT CONTRAST TECHNIQUE: Multidetector CT imaging of the pelvis was performed following the standard protocol without intravenous contrast. RADIATION DOSE REDUCTION: This exam was performed according to the departmental dose-optimization program which includes automated exposure control, adjustment of the mA and/or kV according to patient size and/or use of iterative reconstruction technique. COMPARISON:  Radiographs, same date. FINDINGS: Both hips are normally located. There are nondisplaced fractures of the left femoral neck and intertrochanteric region. The pubic symphysis and SI joints are intact. No pelvic fractures are identified. Right hip hardware related to prior fracture fixation with gamma nail and dynamic hip screw. No complicating features. No significant intrapelvic abnormalities are identified. Severe sigmoid colon diverticulosis. Advanced aortic and iliac artery calcifications but no aneurysm. IMPRESSION: 1. Nondisplaced fractures of the left femoral neck and intertrochanteric region. 2. Right hip hardware related to prior fracture fixation with gamma nail and dynamic hip screw. No complicating features. 3. Severe sigmoid colon diverticulosis. 4. Aortic atherosclerosis. Aortic Atherosclerosis (ICD10-I70.0). Electronically Signed   By: MYRTIS Stammer M.D.   On: 09/11/2024 17:16   CT Head Wo Contrast Result Date: 09/11/2024 CLINICAL DATA:  Unwitnessed fall. EXAM: CT HEAD WITHOUT CONTRAST CT  CERVICAL SPINE WITHOUT CONTRAST TECHNIQUE: Multidetector CT imaging of the head and cervical spine was performed following the standard protocol without intravenous contrast. Multiplanar CT image reconstructions of the cervical spine were also generated. RADIATION DOSE REDUCTION: This exam was performed according to the departmental dose-optimization program which includes automated exposure control, adjustment of the mA and/or kV according to patient size and/or use of iterative reconstruction technique. COMPARISON:  04/10/2024 FINDINGS: CT HEAD FINDINGS Brain: Stable age related cerebral atrophy, ventriculomegaly and periventricular white matter disease. No extra-axial fluid collections are identified.  No CT findings for acute hemispheric infarction or intracranial hemorrhage. No mass lesions. The brainstem and cerebellum grossly normal. There is a small probable lacunar type infarct in the brainstem. Vascular: Stable vascular calcifications. Skull: No acute skull fracture. Sinuses/Orbits: The paranasal sinuses and mastoid air cells are clear. The globes are intact. Other: No scalp lesion or scalp hematoma. CT CERVICAL SPINE FINDINGS Alignment: Degenerative cervical spondylosis with mild multilevel degenerative subluxations but the overall alignment is maintained. Skull base and vertebrae: No acute fracture. No primary bone lesion or focal pathologic process. Soft tissues and spinal canal: No prevertebral fluid or swelling. No visible canal hematoma. Disc levels: The spinal canal is generous. No significant canal stenosis. Upper chest: No significant findings. Other: Bilateral carotid artery calcifications. Stable right thyroid  goiter. IMPRESSION: 1. Stable age related cerebral atrophy, ventriculomegaly and periventricular white matter disease. 2. No acute intracranial findings or skull fracture. 3. Degenerative cervical spondylosis with mild multilevel degenerative subluxations but no acute cervical spine  fracture. Electronically Signed   By: MYRTIS Stammer M.D.   On: 09/11/2024 17:11   CT Cervical Spine Wo Contrast Result Date: 09/11/2024 CLINICAL DATA:  Unwitnessed fall. EXAM: CT HEAD WITHOUT CONTRAST CT CERVICAL SPINE WITHOUT CONTRAST TECHNIQUE: Multidetector CT imaging of the head and cervical spine was performed following the standard protocol without intravenous contrast. Multiplanar CT image reconstructions of the cervical spine were also generated. RADIATION DOSE REDUCTION: This exam was performed according to the departmental dose-optimization program which includes automated exposure control, adjustment of the mA and/or kV according to patient size and/or use of iterative reconstruction technique. COMPARISON:  04/10/2024 FINDINGS: CT HEAD FINDINGS Brain: Stable age related cerebral atrophy, ventriculomegaly and periventricular white matter disease. No extra-axial fluid collections are identified. No CT findings for acute hemispheric infarction or intracranial hemorrhage. No mass lesions. The brainstem and cerebellum grossly normal. There is a small probable lacunar type infarct in the brainstem. Vascular: Stable vascular calcifications. Skull: No acute skull fracture. Sinuses/Orbits: The paranasal sinuses and mastoid air cells are clear. The globes are intact. Other: No scalp lesion or scalp hematoma. CT CERVICAL SPINE FINDINGS Alignment: Degenerative cervical spondylosis with mild multilevel degenerative subluxations but the overall alignment is maintained. Skull base and vertebrae: No acute fracture. No primary bone lesion or focal pathologic process. Soft tissues and spinal canal: No prevertebral fluid or swelling. No visible canal hematoma. Disc levels: The spinal canal is generous. No significant canal stenosis. Upper chest: No significant findings. Other: Bilateral carotid artery calcifications. Stable right thyroid  goiter. IMPRESSION: 1. Stable age related cerebral atrophy, ventriculomegaly and  periventricular white matter disease. 2. No acute intracranial findings or skull fracture. 3. Degenerative cervical spondylosis with mild multilevel degenerative subluxations but no acute cervical spine fracture. Electronically Signed   By: MYRTIS Stammer M.D.   On: 09/11/2024 17:11   DG Chest Portable 1 View Result Date: 09/11/2024 CLINICAL DATA:  Unwitnessed fall. EXAM: PORTABLE CHEST 1 VIEW COMPARISON:  04/10/2024 FINDINGS: The cardiac silhouette, mediastinal and hilar contours are within normal limits for age and stable. Stable tortuosity and calcification of the thoracic aorta. No acute pulmonary findings. No pleural effusion, pneumothorax or pulmonary contusion. The bony thorax is intact.  No definite rib fractures. IMPRESSION: No acute cardiopulmonary findings. Electronically Signed   By: MYRTIS Stammer M.D.   On: 09/11/2024 17:05    EKG:  Personally reviewed ST at 110, LAD, LVH, Qtc is 504  Repeat pending due to irregular exam   ED Course:  Patient was given IV  ceftriaxone  and urine culture is in process. Patient received IV Haldol  2 mg and IV fentanyl  25 mcg while in the ED. EDP discussed with orthopedics Dr. Celena who requested medical admission to [facility] and keep patient n.p.o. after midnight.     Assessment/Plan:  89 y.o. female with hx alzheimers dementia with agitation, hx recurrent falls, CVA, ? Hx irregular heartbeat without dx arrhythmia, CAD by imaging, Hx IDA,  remote hx colon adenocarcinoma s/p partial colectomy, adjuvant chemotherapy , HLD, who is transferred from [facility] ED for GLF c/b L femoral neck and intertrochanteric fracture, R 12 rib fracture.   Ground level fall  L femoral neck, intertrochanteric fracture, nondisplaced R 12 rib fracture, displaced   -Orthopedic surgery consulted, Dr. Celena tentative plan for OR tomorrow -N.p.o. after midnight -DVT prophylaxis to be readdressed postop; SCD for now  -Tele monitoring periop -PT/ OT eval postop ordered   -Pain  mgmt: Tylenol  prn for mild, oxycodone  2.5/5 mg p.o. every 4 hours prn for moderate/severe, Dilaudid  0.5 mg IV every 4 hours prn for breakthrough -Check vitamin D , start on vitamin D3 1000 IU daily though likely not much benefit with her age   ? SVT on exam  Hx irregular heartbeat without diagnosed arrhythmia  -- Repeat EKG and start on telemetry  -- Give 1 L IVF  -- Further management pending results -> Update: appears to be sinus tach with frequent PAC + compensatory pauses, I do not think is Afib despite automated read. Will order for Metoprolol  5 mg IV prn for rates > 120 if she has SVT.   UTI, uncomplicated  -- Continue Ceftriaxone  1 g IV q 24 hr, f/u urine culture   Chronic medical problems:  Alzheimers with behavioral disturbance: Seroquel  25 mg BID for now  Hx CVA: Do not think she is on antiPLT, statin  CAD by imaging: see CVA  HLD: Not on medication  GERD: Famotidine  prn   Body mass index is 17.62 kg/m. Mild protein calorie malnutrition    DVT prophylaxis:  SCDs Code Status:  DNR/DNI(Do NOT Intubate); DDNR on file  Diet:  Diet Orders (From admission, onward)     Start     Ordered   09/12/24 0001  Diet NPO time specified  Diet effective midnight        09/11/24 2305           Family Communication:  None   Consults:  Orthopedics   Admission status:   Inpatient, Med-Surg  Severity of Illness: The appropriate patient status for this patient is INPATIENT. Inpatient status is judged to be reasonable and necessary in order to provide the required intensity of service to ensure the patient's safety. The patient's presenting symptoms, physical exam findings, and initial radiographic and laboratory data in the context of their chronic comorbidities is felt to place them at high risk for further clinical deterioration. Furthermore, it is not anticipated that the patient will be medically stable for discharge from the hospital within 2 midnights of admission.   * I certify  that at the point of admission it is my clinical judgment that the patient will require inpatient hospital care spanning beyond 2 midnights from the point of admission due to high intensity of service, high risk for further deterioration and high frequency of surveillance required.*   Dorn Dawson, MD [facility]  How to contact the [facility] Attending or Consulting provider 7A - 7P or covering provider during after hours 7P -7A, for this patient.  Check the  care team in [facility] and look for a) attending/consulting [facility] provider listed and b) the [facility] team listed Log into [email or web address] and use 's universal password to access. If you do not have the password, please contact the hospital operator. Locate the [facility] provider you are looking for under [facility] and page to a number that you can be directly reached. If you still have difficulty reaching the provider, please page the [facility] (Director on Call) for the Hospitalists listed on amion for assistance.  09/12/2024, 12:08 AM

## 2024-09-12 NOTE — Discharge Instructions (Signed)
 Orthopaedic Trauma Service Discharge Instructions   General Discharge Instructions  WEIGHT BEARING STATUS:weightbearing as tolerated  RANGE OF MOTION/ACTIVITY: ok for unrestricted hip motion  Wound Care: You may remove your surgical dressing on post op day #2 (Wednesday 09/14/24). Incisions can be left open to air if there is no drainage. Once the incision is completely dry and without drainage, it may be left open to air out.  Showering may begin post op day #3 (Thursday 09/15/24).  Clean incision gently with soap and water .  DVT/PE prophylaxis: Aspirin   Diet: as you were eating previously.  Can use over the counter stool softeners and bowel preparations, such as Miralax , to help with bowel movements.  Narcotics can be constipating.  Be sure to drink plenty of fluids  PAIN MEDICATION USE AND EXPECTATIONS  You have likely been given narcotic medications to help control your pain.  After a traumatic event that results in an fracture (broken bone) with or without surgery, it is ok to use narcotic pain medications to help control one's pain.  We understand that everyone responds to pain differently and each individual patient will be evaluated on a regular basis for the continued need for narcotic medications. Ideally, narcotic medication use should last no more than 6-8 weeks (coinciding with fracture healing).   As a patient it is your responsibility as well to monitor narcotic medication use and report the amount and frequency you use these medications when you come to your office visit.   We would also advise that if you are using narcotic medications, you should take a dose prior to therapy to maximize you participation.  IF YOU ARE ON NARCOTIC MEDICATIONS IT IS NOT PERMISSIBLE TO OPERATE A MOTOR VEHICLE (MOTORCYCLE/CAR/TRUCK/MOPED) OR HEAVY MACHINERY DO NOT MIX NARCOTICS WITH OTHER CNS (CENTRAL NERVOUS SYSTEM) DEPRESSANTS SUCH AS ALCOHOL  POST-OPERATIVE OPIOID TAPER INSTRUCTIONS: It is  important to wean off of your opioid medication as soon as possible. If you do not need pain medication after your surgery it is ok to stop day one. Opioids include: Codeine, Hydrocodone (Norco, Vicodin), Oxycodone (Percocet, oxycontin ) and hydromorphone  amongst others.  Long term and even short term use of opiods can cause: Increased pain response Dependence Constipation Depression Respiratory depression And more.  Withdrawal symptoms can include Flu like symptoms Nausea, vomiting And more Techniques to manage these symptoms Hydrate well Eat regular healthy meals Stay active Use relaxation techniques(deep breathing, meditating, yoga) Do Not substitute Alcohol to help with tapering If you have been on opioids for less than two weeks and do not have pain than it is ok to stop all together.  Plan to wean off of opioids This plan should start within one week post op of your fracture surgery  Maintain the same interval or time between taking each dose and first decrease the dose.  Cut the total daily intake of opioids by one tablet each day Next start to increase the time between doses. The last dose that should be eliminated is the evening dose.    STOP SMOKING OR USING NICOTINE PRODUCTS!!!!  As discussed nicotine severely impairs your body's ability to heal surgical and traumatic wounds but also impairs bone healing.  Wounds and bone heal by forming microscopic blood vessels (angiogenesis) and nicotine is a vasoconstrictor (essentially, shrinks blood vessels).  Therefore, if vasoconstriction occurs to these microscopic blood vessels they essentially disappear and are unable to deliver necessary nutrients to the healing tissue.  This is one modifiable factor that you can do to dramatically  increase your chances of healing your injury.  (This means no smoking, no nicotine gum, patches, etc)  DO NOT USE NONSTEROIDAL ANTI-INFLAMMATORY DRUGS (NSAID'S)  Using products such as Advil  (ibuprofen), Aleve (naproxen), Motrin (ibuprofen) for additional pain control during fracture healing can delay and/or prevent the healing response.  If you would like to take over the counter (OTC) medication, Tylenol  (acetaminophen ) is ok.  However, some narcotic medications that are given for pain control contain acetaminophen  as well. Therefore, you should not exceed more than 4000 mg of tylenol  in a day if you do not have liver disease.  Also note that there are may OTC medicines, such as cold medicines and allergy medicines that my contain tylenol  as well.  If you have any questions about medications and/or interactions please ask your doctor/PA or your pharmacist.      ICE AND ELEVATE INJURED/OPERATIVE EXTREMITY  Using ice and elevating the injured extremity above your heart can help with swelling and pain control.  Icing in a pulsatile fashion, such as 20 minutes on and 20 minutes off, can be followed.    Do not place ice directly on skin. Make sure there is a barrier between to skin and the ice pack.    Using frozen items such as frozen peas works well as the conform nicely to the are that needs to be iced.  USE AN ACE WRAP OR TED HOSE FOR SWELLING CONTROL  In addition to icing and elevation, Ace wraps or TED hose are used to help limit and resolve swelling.  It is recommended to use Ace wraps or TED hose until you are informed to stop.    When using Ace Wraps start the wrapping distally (farthest away from the body) and wrap proximally (closer to the body)   Example: If you had surgery on your leg or thing and you do not have a splint on, start the ace wrap at the toes and work your way up to the thigh        If you had surgery on your upper extremity and do not have a splint on, start the ace wrap at your fingers and work your way up to the upper arm   CALL THE OFFICE FOR MEDICATION REFILLS OR WITH ANY QUESTIONS/CONCERNS: [phone]   VISIT OUR WEBSITE FOR ADDITIONAL INFORMATION:  [email or web address]    Discharge Wound Care Instructions  Do NOT apply any ointments, solutions or lotions to pin sites or surgical wounds.  These prevent needed drainage and even though solutions like hydrogen peroxide kill bacteria, they also damage cells lining the pin sites that help fight infection.  Applying lotions or ointments can keep the wounds moist and can cause them to breakdown and open up as well. This can increase the risk for infection. When in doubt call the office.  Surgical incisions should be dressed daily.  If any drainage is noted, use foam dressing (Mepilex) - These dressing supplies should be available at local medical supply stores [facility], [facility], etc) as well as Insurance claims handler ([employer], [facility], [employer], etc)  Once the incision is completely dry and without drainage, it may be left open to air out.  Showering may begin 36-48 hours later.  Cleaning gently with soap and water .   Call office for the following: Temperature greater than 101F Persistent nausea and vomiting Severe uncontrolled pain Redness, tenderness, or signs of infection (pain, swelling, redness, odor or green/yellow discharge around the site) Difficulty breathing, headache or visual disturbances Hives  Persistent dizziness or light-headedness Extreme fatigue Any other questions or concerns you may have after discharge  In an emergency, call 911 or go to an Emergency Department at a nearby hospital  OTHER HELPFUL INFORMATION  If you had a block, it will wear off between 8-24 hrs postop typically.  This is period when your pain may go from nearly zero to the pain you would have had postop without the block.  This is an abrupt transition but nothing dangerous is happening.  You may take an extra dose of narcotic when this happens.  You should wean off your narcotic medicines as soon as you are able.  Most patients will be off or using minimal narcotics before their first postop  appointment.   We suggest you use the pain medication the first night prior to going to bed, in order to ease any pain when the anesthesia wears off. You should avoid taking pain medications on an empty stomach as it will make you nauseous.  Do not drink alcoholic beverages or take illicit drugs when taking pain medications.  In most states it is against the law to drive while you are in a splint or sling.  And certainly against the law to drive while taking narcotics.  You may return to work/school in the next couple of days when you feel up to it.   Pain medication may make you constipated.  Below are a few solutions to try in this order: Decrease the amount of pain medication if you aren't having pain. Drink lots of decaffeinated fluids. Drink prune juice and/or each dried prunes  If the first 3 don't work start with additional solutions Take Colace - an over-the-counter stool softener Take Senokot - an over-the-counter laxative Take Miralax  - a stronger over-the-counter laxative

## 2024-09-12 NOTE — H&P (View-Only) (Signed)
 Orthopaedic Trauma Service (OTS) Consult   Patient ID: [name] Barker MRN: [id number] DOB/AGE: 1921-10-14 89 y.o.  Reason for Consult:Left intertrochanteric femur fracture Referring Physician: Dr. Juliene Bicker, DO [facility]  HPI: [name] Barker is an 89 y.o. female who is being seen in consultation at the request of Dr. Bicker for evaluation of left periprosthetic proximal femur fracture.  Patient is known to me from previous open reduction internal fixation of the distal femur in 2019.  She sustained a fall with a left hip fracture above.  She had a nondisplaced intertrochanteric femur fracture identified on x-rays.  Due to her previous relationship with me I was contacted for definitive management.  Patient's daughter is at bedside.  Patient has pretty significant dementia.  But she has been ambulatory with a rolling walker.  She has been walking up and down the driveway her.  She lives with her 30 year old husband as well as her daughter.  Denies any other issues of note today.  Past Medical History:  Diagnosis Date   Allergic rhinitis    ANEMIA-NOS 11/17/2006   Colon cancer (HCC) 08/19/2007   1994 T2, 2008 T3, N1   Colon cancer (HCC)    COVID-19    DIVERTICULOSIS-COLON 05/30/2010   DNR (do not resuscitate)    Dyslipidemia    Femur fracture, left (HCC) 09/13/2018   CLOSED   GERD 11/17/2006   Hemorrhoids    History of CVA (cerebrovascular accident)    HYPERLIPIDEMIA 11/17/2006   LOW BACK PAIN, CHRONIC 02/04/2010   Osteopenia 11/17/2006   Small bowel obstruction (HCC)    TIA (transient ischemic attack)     Past Surgical History:  Procedure Laterality Date   ABDOMINAL HYSTERECTOMY     APPENDECTOMY     BACK SURGERY  12/29/2005   BELPHAROPTOSIS REPAIR     CATARACT EXTRACTION     HEMICOLECTOMY  12/29/1992   Right   INTRAMEDULLARY (IM) NAIL INTERTROCHANTERIC Right 01/27/2021   Procedure: INTRAMEDULLARY (IM) NAIL INTERTROCHANTRIC;  Surgeon: Ernie Cough, MD;   Location: [facility] OR;  Service: Orthopedics;  Laterality: Right;   JOINT REPLACEMENT     KNEE ARTHROSCOPY Left    ORIF FEMUR FRACTURE Left 09/13/2018   Procedure: OPEN REDUCTION INTERNAL FIXATION (ORIF) DISTAL FEMUR FRACTURE;  Surgeon: Kendal Franky SQUIBB, MD;  Location: [facility] OR;  Service: Orthopedics;  Laterality: Left;   PARTIAL COLECTOMY  12/29/2006   ROTATOR CUFF REPAIR     SHOULDER SURGERY     VAGINAL PROLAPSE REPAIR      Family History  Problem Relation Age of Onset   Lung cancer Sister    Colon cancer Sister 65   Kidney cancer Sister     Social History:  reports that she has never smoked. She has never used smokeless tobacco. She reports that she does not currently use alcohol. She reports that she does not use drugs.  Allergies:  Allergies  Allergen Reactions   Morphine  And Codeine Other (See Comments)    Blisters at site and along vein   Flexeril  [Cyclobenzaprine ] Other (See Comments)    Nightmares    Methenamine Mandelate Other (See Comments)    Burning sensation   Penicillins Hives    Did it involve swelling of the face/tongue/throat, SOB, or low BP? Yes Did it involve sudden or severe rash/hives, skin peeling, or any reaction on the inside of your mouth or nose? Unk Did you need to seek medical attention at a hospital or doctor's office? Unk When did it last happen?  Childhood If all above answers are "NO", may proceed with cephalosporin use.    Promethazine Hcl Other (See Comments)    Hallucinations   Statins Other (See Comments)    Muscle pain    Ativan  [Lorazepam ] Anxiety   Morphine  Sulfate Rash and Other (See Comments)    Blisters all over body    Medications:  No current facility-administered medications on file prior to encounter.   Current Outpatient Medications on File Prior to Encounter  Medication Sig Dispense Refill   acetaminophen  (TYLENOL ) 500 MG tablet Take 500 mg by mouth 2 (two) times a day. FOR BACK PAIN     albuterol  (VENTOLIN  HFA) 108 (90 Base)  MCG/ACT inhaler INHALE 2 PUFFS BY MOUTH EVERY 6 HOURS AS NEEDED FOR WHEEZING OR FOR SHORTNESS OF BREATH 18 g 3   amoxicillin -clavulanate (AUGMENTIN ) 875-125 MG tablet Take 1 tablet by mouth 2 (two) times daily. 20 tablet 3   busPIRone  (BUSPAR ) 5 MG tablet Take 1 tablet (5 mg total) by mouth 2 (two) times daily as needed. 60 tablet 3   citalopram  (CELEXA ) 10 MG tablet Take 1 tablet (10 mg total) by mouth daily. 90 tablet 3   Cranberry 500 MG TABS Take 500 mg by mouth in the morning and at bedtime.     famotidine  (PEPCID ) 20 MG tablet Take 1 tablet (20 mg total) by mouth at bedtime. 90 tablet 3   fexofenadine (ALLEGRA) 180 MG tablet Take 180 mg by mouth in the morning.     fluticasone  (FLONASE ) 50 MCG/ACT nasal spray Place 1 spray into both nostrils in the morning and at bedtime.     QUEtiapine  (SEROQUEL ) 25 MG tablet Take 0.5-1 tablets (12.5-25 mg total) by mouth 2 (two) times daily as needed (agitation). 60 tablet 0   traZODone  (DESYREL ) 50 MG tablet Take 1 tablet (50 mg total) by mouth at bedtime. 90 tablet 3   [DISCONTINUED] pantoprazole  (PROTONIX ) 40 MG tablet Take 1 tablet (40 mg total) by mouth daily. (Patient not taking: Reported on 02/05/2021) 30 tablet 0     ROS: Not able to be obtained due to dementia  Exam: Blood pressure (!) 149/70, pulse 64, temperature 98.2 F (36.8 C), temperature source Oral, resp. rate 18, height 5' 1 (1.549 m), weight 42.3 kg, SpO2 93%. General: No acute distress Orientation: Awake and alert but not oriented Mood and Affect: Unable to fully assess due to dementia Gait: Unable to assess Coordination and balance: Unable to assess  Left lower extremity: Skin is in place without significant damage.  Previous surgical incisions to the distal femur are without any signs of any erythema and healed fully.  Compartments are soft compressible.  She is warm well-perfused foot she is able to move her toes but does not really follow any commands.  Right lower extremity  skin without lesions. No tenderness to palpation. Full painless ROM, full strength in each muscle groups without evidence of instability.   Medical Decision Making: Data: Imaging: X-rays and CT scan are reviewed which shows a nondisplaced intertrochanteric femur fracture with previous surgical fixation and plate of the femoral shaft with no signs of any hardware failure or loosening.  Labs:  Results for orders placed or performed during the hospital encounter of 09/11/24 (from the past 24 hours)  CBC with Differential     Status: Abnormal   Collection Time: 09/11/24  3:45 PM  Result Value Ref Range   WBC 9.2 4.0 - 10.5 K/uL   RBC 3.74 (L) 3.87 - 5.11  MIL/uL   Hemoglobin 10.7 (L) 12.0 - 15.0 g/dL   HCT 66.7 (L) 63.9 - 53.9 %   MCV 88.8 80.0 - 100.0 fL   MCH 28.6 26.0 - 34.0 pg   MCHC 32.2 30.0 - 36.0 g/dL   RDW 84.6 88.4 - 84.4 %   Platelets 220 150 - 400 K/uL   nRBC 0.0 0.0 - 0.2 %   Neutrophils Relative % 71 %   Neutro Abs 6.4 1.7 - 7.7 K/uL   Lymphocytes Relative 20 %   Lymphs Abs 1.8 0.7 - 4.0 K/uL   Monocytes Relative 8 %   Monocytes Absolute 0.8 0.1 - 1.0 K/uL   Eosinophils Relative 1 %   Eosinophils Absolute 0.1 0.0 - 0.5 K/uL   Basophils Relative 0 %   Basophils Absolute 0.0 0.0 - 0.1 K/uL   Immature Granulocytes 0 %   Abs Immature Granulocytes 0.03 0.00 - 0.07 K/uL  Comprehensive metabolic panel     Status: Abnormal   Collection Time: 09/11/24  3:45 PM  Result Value Ref Range   Sodium 135 135 - 145 mmol/L   Potassium 4.1 3.5 - 5.1 mmol/L   Chloride 100 98 - 111 mmol/L   CO2 24 22 - 32 mmol/L   Glucose, Bld 108 (H) 70 - 99 mg/dL   BUN 22 8 - 23 mg/dL   Creatinine, Ser 9.33 0.44 - 1.00 mg/dL   Calcium 9.3 8.9 - 89.6 mg/dL   Total Protein 7.5 6.5 - 8.1 g/dL   Albumin 3.9 3.5 - 5.0 g/dL   AST 20 15 - 41 U/L   ALT 8 0 - 44 U/L   Alkaline Phosphatase 74 38 - 126 U/L   Total Bilirubin 0.6 0.0 - 1.2 mg/dL   GFR, Estimated >39 >39 mL/min   Anion gap 12 5 - 15   Lipase, blood     Status: None   Collection Time: 09/11/24  3:45 PM  Result Value Ref Range   Lipase 24 11 - 51 U/L  Urinalysis, Routine w reflex microscopic -Urine, Clean Catch     Status: Abnormal   Collection Time: 09/11/24  3:45 PM  Result Value Ref Range   Color, Urine YELLOW YELLOW   APPearance HAZY (A) CLEAR   Specific Gravity, Urine 1.016 1.005 - 1.030   pH 6.0 5.0 - 8.0   Glucose, UA NEGATIVE NEGATIVE mg/dL   Hgb urine dipstick SMALL (A) NEGATIVE   Bilirubin Urine NEGATIVE NEGATIVE   Ketones, ur TRACE (A) NEGATIVE mg/dL   Protein, ur TRACE (A) NEGATIVE mg/dL   Nitrite POSITIVE (A) NEGATIVE   Leukocytes,Ua LARGE (A) NEGATIVE   RBC / HPF 0-5 0 - 5 RBC/hpf   WBC, UA >50 0 - 5 WBC/hpf   Bacteria, UA RARE (A) NONE SEEN   Squamous Epithelial / HPF 0-5 0 - 5 /HPF   Mucus PRESENT   Surgical PCR screen     Status: None   Collection Time: 09/12/24  1:55 AM   Specimen: Nasal Mucosa; Nasal Swab  Result Value Ref Range   MRSA, PCR NEGATIVE NEGATIVE   Staphylococcus aureus NEGATIVE NEGATIVE  Basic metabolic panel with GFR     Status: Abnormal   Collection Time: 09/12/24  3:16 AM  Result Value Ref Range   Sodium 134 (L) 135 - 145 mmol/L   Potassium 3.6 3.5 - 5.1 mmol/L   Chloride 99 98 - 111 mmol/L   CO2 24 22 - 32 mmol/L   Glucose, Bld 113 (H)  70 - 99 mg/dL   BUN 16 8 - 23 mg/dL   Creatinine, Ser 9.28 0.44 - 1.00 mg/dL   Calcium 8.4 (L) 8.9 - 10.3 mg/dL   GFR, Estimated >39 >39 mL/min   Anion gap 11 5 - 15  CBC     Status: Abnormal   Collection Time: 09/12/24  3:16 AM  Result Value Ref Range   WBC 9.7 4.0 - 10.5 K/uL   RBC 3.56 (L) 3.87 - 5.11 MIL/uL   Hemoglobin 10.2 (L) 12.0 - 15.0 g/dL   HCT 68.1 (L) 63.9 - 53.9 %   MCV 89.3 80.0 - 100.0 fL   MCH 28.7 26.0 - 34.0 pg   MCHC 32.1 30.0 - 36.0 g/dL   RDW 84.6 88.4 - 84.4 %   Platelets 207 150 - 400 K/uL   nRBC 0.0 0.0 - 0.2 %  Magnesium      Status: Abnormal   Collection Time: 09/12/24  3:16 AM  Result Value Ref  Range   Magnesium  1.6 (L) 1.7 - 2.4 mg/dL  Phosphorus     Status: None   Collection Time: 09/12/24  3:16 AM  Result Value Ref Range   Phosphorus 2.6 2.5 - 4.6 mg/dL  Protime-INR     Status: Abnormal   Collection Time: 09/12/24  3:16 AM  Result Value Ref Range   Prothrombin Time 15.4 (H) 11.4 - 15.2 seconds   INR 1.2 0.8 - 1.2  Type and screen [facility]     Status: None (Preliminary result)   Collection Time: 09/12/24  8:08 AM  Result Value Ref Range   ABO/RH(D) PENDING    Antibody Screen PENDING    Sample Expiration      09/15/2024,2359 Performed at [facility], [address], [place], [place] [address]      Imaging or Labs ordered: None  Medical history and chart was reviewed and case discussed with medical provider.  Assessment/Plan: 89 year old with periprosthetic proximal femur fracture.  Due to her age and mobility level I recommend proceeding with Seph medullary nailing of the left hip.  Will likely need to remove one of the distal interlocking screws from the distal femur plate.  Risks and benefits were discussed with the patient's daughter.  Risks include but not limited to bleeding, infection, hardware failure, hardware irritation, nerve and blood vessel injury, DVT, in the past anesthetic complications.  They agreed to proceed with surgery and consent was obtained.  Franky MYRTIS Light, MD [facility] [phone] (office) [email or web address]

## 2024-09-13 ENCOUNTER — Encounter (HOSPITAL_COMMUNITY): Payer: Self-pay | Admitting: Student

## 2024-09-13 DIAGNOSIS — S72002A Fracture of unspecified part of neck of left femur, initial encounter for closed fracture: Secondary | ICD-10-CM | POA: Diagnosis not present

## 2024-09-13 LAB — CBC
HCT: 28.3 % — ABNORMAL LOW (ref 36.0–46.0)
Hemoglobin: 9.1 g/dL — ABNORMAL LOW (ref 12.0–15.0)
MCH: 28.8 pg (ref 26.0–34.0)
MCHC: 32.2 g/dL (ref 30.0–36.0)
MCV: 89.6 fL (ref 80.0–100.0)
Platelets: 152 K/uL (ref 150–400)
RBC: 3.16 MIL/uL — ABNORMAL LOW (ref 3.87–5.11)
RDW: 15.1 % (ref 11.5–15.5)
WBC: 8.1 K/uL (ref 4.0–10.5)
nRBC: 0 % (ref 0.0–0.2)

## 2024-09-13 LAB — TYPE AND SCREEN
ABO/RH(D): O POS
Antibody Screen: NEGATIVE

## 2024-09-13 LAB — URINE CULTURE: Culture: >=100000 — AB

## 2024-09-13 LAB — BASIC METABOLIC PANEL WITH GFR
Anion gap: 10 (ref 5–15)
BUN: 22 mg/dL (ref 8–23)
CO2: 24 mmol/L (ref 22–32)
Calcium: 8.3 mg/dL — ABNORMAL LOW (ref 8.9–10.3)
Chloride: 100 mmol/L (ref 98–111)
Creatinine, Ser: 0.6 mg/dL (ref 0.44–1.00)
GFR, Estimated: >60 mL/min (ref >60)
Glucose, Bld: 100 mg/dL — ABNORMAL HIGH (ref 70–99)
Potassium: 4.2 mmol/L (ref 3.5–5.1)
Sodium: 134 mmol/L — ABNORMAL LOW (ref 135–145)

## 2024-09-13 MED ORDER — MAGNESIUM SULFATE 2 GM/50ML IV SOLN
2.0000 g | Freq: Once | INTRAVENOUS | Status: AC
  Administered 2024-09-13: 2 g via INTRAVENOUS
  Filled 2024-09-13: qty 50

## 2024-09-13 NOTE — Progress Notes (Signed)
 Orthopaedic Trauma Progress Note  SUBJECTIVE: Patient doing okay this morning.  Awake and alert but noted to be slightly confused at times.  Difficult time communicating with patient due to poor hearing. Patient's daughter unfortunately has her hearing aids. Patient states she is not having much pain at rest.  She has not required any pain medications postoperatively (Tylenol , Robaxin , oxycodone ) she has not been up out of bed yet since surgery.  Per chart review, no acute events noted overnight.  No family at bedside currently.  OBJECTIVE:  Vitals:   09/13/24 0557 09/13/24 0824  BP: 104/79 116/61  Pulse: 86 69  Resp: 16 16  Temp: 97.8 F (36.6 C) 97.9 F (36.6 C)  SpO2: 98% 100%    Opiates Today (MME): Today's  total administered Morphine  Milligram Equivalents: 0 Opiates Yesterday (MME): Yesterday's total administered Morphine  Milligram Equivalents: 7.5  General: Sitting up in bed comfortably, no acute distress Respiratory: No increased work of breathing.  Operative Extremity (left lower extremity): Dressings over the lateral hip/thigh are clean, dry, intact.  Winces in discomfort with palpation over the lateral hip as expected.  Otherwise does not show any signs of pain/discomfort with palpation to the thigh or lower leg.  No obvious signs of calf tenderness.  Tolerates gentle ankle range of motion.  Skin warm and dry.  + DP pulse  IMAGING: Stable post op imaging left hip  LABS:  Results for orders placed or performed during the hospital encounter of 09/11/24 (from the past 24 hours)  CBC     Status: Abnormal   Collection Time: 09/13/24  2:56 AM  Result Value Ref Range   WBC 8.1 4.0 - 10.5 K/uL   RBC 3.16 (L) 3.87 - 5.11 MIL/uL   Hemoglobin 9.1 (L) 12.0 - 15.0 g/dL   HCT 71.6 (L) 63.9 - 53.9 %   MCV 89.6 80.0 - 100.0 fL   MCH 28.8 26.0 - 34.0 pg   MCHC 32.2 30.0 - 36.0 g/dL   RDW 84.8 88.4 - 84.4 %   Platelets 152 150 - 400 K/uL   nRBC 0.0 0.0 - 0.2 %    ASSESSMENT:  [name] Barker is a 89 y.o. female, 1 Day Post-Op s/p fall Procedures:  CEPHALOMEDULLARY NAILING LEFT INTERTROCHANTERIC FEMUR FRACTURE  HARDWARE REMOVAL LEFT FEMUR  CV/Blood loss: Acute blood loss anemia, Hgb 9.1 this AM. Hemodynamically stable  PLAN: Weightbearing: WBAT LLE ROM: Unrestricted ROM Incisional and dressing care: Reinforce dressings as needed  Showering: Ok to begin getting incisions wet starting 09/15/2024 Orthopedic device(s): None  Pain management:  1. Tylenol  1000 mg q 6 hours PRN 2. Robaxin  500 mg q 8 hours PRN 3. Oxycodone  2.5-5 mg q 4 hours PRN 4. Dilaudid  0.5 mg q 4 hours PRN VTE prophylaxis: Aspirin , SCDs ID: Patient on Rocephin  for UTI.  No additional antibiotics needed from ortho standpoint Foley/Lines:  No foley, KVO IVFs Impediments to Fracture Healing: Vitamin D  level 23, will start patient on supplementation Dispo: PT/OT evaluation today.  Okay for discharge from ortho standpoint once cleared by medicine team and therapies  D/C recommendations: - Tylenol , oxycodone  2.5 mg PRN for pain control - Aspirin  325 mg daily x 30 days for DVT prophylaxis - Continue 1000 units of vitamin D3 supplementation daily  Follow - up plan: 2 weeks after d/c for wound check and repeat x-rays   Contact information:  Franky Light MD, Lauraine Moores PA-C. After hours and holidays please check [email or web address] for group call information for [facility]  Lauraine PATRIC Moores, PA-C [phone] (office) [email or web address]

## 2024-09-13 NOTE — Progress Notes (Addendum)
 PROGRESS NOTE    [name] Barker  FMW:[id number] DOB: [birth year] DOA: 09/11/2024 PCP: Rollene Almarie LABOR, MD   Brief Narrative:  This 89 years old female with PMH significant for Alzheimer's dementia, intermittent agitation, history of recurrent falls, CVA, history of irregular heartbeat without diagnosis of arrhythmias, Iron deficiency anemia, remote history of colonic adenocarcinoma status post partial colectomy, adjuvant chemotherapy, hyperlipidemia presented status post mechanical fall with left femoral neck and intertrochanteric fracture.  Patient was admitted for further evaluation. Orthopedics is consulted.  Patient is status post ORIF.  POD #1.   Assessment & Plan:   Principal Problem:   Closed left hip fracture, initial encounter (HCC)  Left femoral neck intertrochanteric fracture: Status post mechanical fall: R 12 rib fracture, displaced : Orthopedic surgery consulted, status post ORIF. POD # 1 Adequate pain control with Tylenol , Robaxin , oxycodone  and Dilaudid  as needed WBAT LLE PT and OT evaluation. Continue vitamin D  supplementation.   Hx irregular heartbeat without diagnosed arrhythmia: EKG shows sinus tachycardia, left anterior fascicular block. She appears to be sinus tach with frequent PAC + compensatory pauses,  I do not think is Afib despite automated read.  Continue Metoprolol  5 mg IV prn for rates > 120 if she has SVT.    UTI, uncomplicated : Continue Ceftriaxone  1 g IV q 24 hr Urine culture grew 100 K of E. coli.   Alzheimers with behavioral disturbance:  Continue Seroquel  25 mg BID.  Hx CVA:  She is not on antiPLT, statin  Started on aspirin  325 daily for DVT prophylaxis    DVT prophylaxis: SCDs Code Status: DNR Family Communication: No family at bed side. Disposition Plan:    Status is: Inpatient Remains inpatient appropriate because: Admitted status post mechanical fall with left femur fracture,  status post ORIF POD #1     Consultants:  Orthopedics  Procedures: S/P ORIF Antimicrobials:  Anti-infectives (From admission, onward)    Start     Dose/Rate Route Frequency Ordered Stop   09/12/24 1800  cefTRIAXone  (ROCEPHIN ) 1 g in sodium chloride  0.9 % 100 mL IVPB        1 g 200 mL/hr over 30 Minutes Intravenous Every 24 hours 09/12/24 0029 09/16/24 1759   09/12/24 1014  vancomycin  (VANCOCIN ) powder  Status:  Discontinued          As needed 09/12/24 1014 09/12/24 1046   09/11/24 1800  cefTRIAXone  (ROCEPHIN ) 2 g in sodium chloride  0.9 % 100 mL IVPB        2 g 200 mL/hr over 30 Minutes Intravenous  Once 09/11/24 1757 09/11/24 1910       Subjective: Patient was seen and examined at bedside.  Overnight events noted. Patient is hard of hearing. Patient is status post ORIF  POD # 1.  She has mittens as she was intermittently confused.  Objective: Vitals:   09/12/24 1300 09/12/24 1559 09/13/24 0557 09/13/24 0824  BP: 133/83 133/74 104/79 116/61  Pulse:  (!) 50 86 69  Resp: 16 16 16 16   Temp: 98.3 F (36.8 C) (!) 97.3 F (36.3 C) 97.8 F (36.6 C) 97.9 F (36.6 C)  TempSrc: Oral Oral Axillary Oral  SpO2: 100% 97% 98% 100%  Weight:      Height:        Intake/Output Summary (Last 24 hours) at 09/13/2024 1433 Last data filed at 09/13/2024 0900 Gross per 24 hour  Intake 135 ml  Output 1100 ml  Net -965 ml   Filed Weights   09/11/24 1522  09/11/24 2224 09/12/24 0725  Weight: 45.8 kg 42.3 kg 42.3 kg    Examination:  General exam: Appears calm and comfortable, deconditioned, in mittens, not in any distress.  Hard of hearing Respiratory system: Clear to auscultation. Respiratory effort normal.  RR 14 Cardiovascular system: S1 & S2 heard, RRR. No JVD, murmurs, rubs, gallops or clicks.  Gastrointestinal system: Abdomen is non distended, soft and non tender.  Normal bowel sounds heard. Central nervous system: Alert and oriented x 1. No focal neurological deficits. Extremities: s/p ORIF  POD # 1 Skin:  No rashes, lesions or ulcers Psychiatry: Mood & affect appropriate.     Data Reviewed: I have personally reviewed following labs and imaging studies  CBC: Recent Labs  Lab 09/11/24 1545 09/12/24 0316 09/13/24 0256  WBC 9.2 9.7 8.1  NEUTROABS 6.4  --   --   HGB 10.7* 10.2* 9.1*  HCT 33.2* 31.8* 28.3*  MCV 88.8 89.3 89.6  PLT 220 207 152   Basic Metabolic Panel: Recent Labs  Lab 09/11/24 1545 09/12/24 0316 09/13/24 0858  NA 135 134* 134*  K 4.1 3.6 4.2  CL 100 99 100  CO2 24 24 24   GLUCOSE 108* 113* 100*  BUN 22 16 22   CREATININE 0.66 0.71 0.60  CALCIUM 9.3 8.4* 8.3*  MG  --  1.6*  --   PHOS  --  2.6  --    GFR: Estimated Creatinine Clearance: 24.3 mL/min (by C-G formula based on SCr of 0.6 mg/dL). Liver Function Tests: Recent Labs  Lab 09/11/24 1545  AST 20  ALT 8  ALKPHOS 74  BILITOT 0.6  PROT 7.5  ALBUMIN 3.9   Recent Labs  Lab 09/11/24 1545  LIPASE 24   No results for input(s): AMMONIA in the last 168 hours. Coagulation Profile: Recent Labs  Lab 09/12/24 0316  INR 1.2   Cardiac Enzymes: No results for input(s): CKTOTAL, CKMB, CKMBINDEX, TROPONINI in the last 168 hours. BNP (last 3 results) No results for input(s): PROBNP in the last 8760 hours. HbA1C: No results for input(s): HGBA1C in the last 72 hours. CBG: No results for input(s): GLUCAP in the last 168 hours. Lipid Profile: No results for input(s): CHOL, HDL, LDLCALC, TRIG, CHOLHDL, LDLDIRECT in the last 72 hours. Thyroid  Function Tests: No results for input(s): TSH, T4TOTAL, FREET4, T3FREE, THYROIDAB in the last 72 hours. Anemia Panel: No results for input(s): VITAMINB12, FOLATE, FERRITIN, TIBC, IRON, RETICCTPCT in the last 72 hours. Sepsis Labs: No results for input(s): PROCALCITON, LATICACIDVEN in the last 168 hours.  Recent Results (from the past 240 hours)  Urine Culture     Status: Abnormal   Collection Time: 09/11/24   5:58 PM   Specimen: Urine, Clean Catch  Result Value Ref Range Status   Specimen Description   Final    URINE, CLEAN CATCH Performed at [facility], [address] [place], [place] [address]    Special Requests   Final    NONE Performed at [facility], [address] [place], [place] [address]    Culture >=100,000 COLONIES/mL ESCHERICHIA COLI (A)  Final   Report Status 09/13/2024 FINAL  Final   Organism ID, Bacteria ESCHERICHIA COLI (A)  Final      Susceptibility   Escherichia coli - MIC*    AMPICILLIN >=32 RESISTANT Resistant     CEFAZOLIN  (URINE) Value in next row Sensitive      <=1 SENSITIVEThis is a modified FDA-approved test that has been validated and its performance  characteristics determined by the reporting laboratory.  This laboratory is certified under the Clinical Laboratory Improvement Amendments CLIA as qualified to perform high complexity clinical laboratory testing.    CEFEPIME Value in next row Sensitive      <=1 SENSITIVEThis is a modified FDA-approved test that has been validated and its performance characteristics determined by the reporting laboratory.  This laboratory is certified under the Clinical Laboratory Improvement Amendments CLIA as qualified to perform high complexity clinical laboratory testing.    ERTAPENEM Value in next row Sensitive      <=1 SENSITIVEThis is a modified FDA-approved test that has been validated and its performance characteristics determined by the reporting laboratory.  This laboratory is certified under the Clinical Laboratory Improvement Amendments CLIA as qualified to perform high complexity clinical laboratory testing.    CEFTRIAXONE  Value in next row Sensitive      <=1 SENSITIVEThis is a modified FDA-approved test that has been validated and its performance characteristics determined by the reporting laboratory.  This laboratory is certified under the Clinical Laboratory Improvement Amendments  CLIA as qualified to perform high complexity clinical laboratory testing.    CIPROFLOXACIN Value in next row Sensitive      <=1 SENSITIVEThis is a modified FDA-approved test that has been validated and its performance characteristics determined by the reporting laboratory.  This laboratory is certified under the Clinical Laboratory Improvement Amendments CLIA as qualified to perform high complexity clinical laboratory testing.    GENTAMICIN Value in next row Sensitive      <=1 SENSITIVEThis is a modified FDA-approved test that has been validated and its performance characteristics determined by the reporting laboratory.  This laboratory is certified under the Clinical Laboratory Improvement Amendments CLIA as qualified to perform high complexity clinical laboratory testing.    NITROFURANTOIN Value in next row Sensitive      <=1 SENSITIVEThis is a modified FDA-approved test that has been validated and its performance characteristics determined by the reporting laboratory.  This laboratory is certified under the Clinical Laboratory Improvement Amendments CLIA as qualified to perform high complexity clinical laboratory testing.    TRIMETH /SULFA  Value in next row Resistant      <=1 SENSITIVEThis is a modified FDA-approved test that has been validated and its performance characteristics determined by the reporting laboratory.  This laboratory is certified under the Clinical Laboratory Improvement Amendments CLIA as qualified to perform high complexity clinical laboratory testing.    AMPICILLIN/SULBACTAM Value in next row Intermediate      <=1 SENSITIVEThis is a modified FDA-approved test that has been validated and its performance characteristics determined by the reporting laboratory.  This laboratory is certified under the Clinical Laboratory Improvement Amendments CLIA as qualified to perform high complexity clinical laboratory testing.    PIP/TAZO Value in next row Sensitive ug/mL     <=4 SENSITIVEThis is  a modified FDA-approved test that has been validated and its performance characteristics determined by the reporting laboratory.  This laboratory is certified under the Clinical Laboratory Improvement Amendments CLIA as qualified to perform high complexity clinical laboratory testing.    MEROPENEM Value in next row Sensitive      <=4 SENSITIVEThis is a modified FDA-approved test that has been validated and its performance characteristics determined by the reporting laboratory.  This laboratory is certified under the Clinical Laboratory Improvement Amendments CLIA as qualified to perform high complexity clinical laboratory testing.    * >=100,000 COLONIES/mL ESCHERICHIA COLI  Surgical PCR screen     Status: None   Collection  Time: 09/12/24  1:55 AM   Specimen: Nasal Mucosa; Nasal Swab  Result Value Ref Range Status   MRSA, PCR NEGATIVE NEGATIVE Final   Staphylococcus aureus NEGATIVE NEGATIVE Final    Comment: (NOTE) The Xpert SA Assay (FDA approved for NASAL specimens in patients 9 years of age and older), is one component of a comprehensive surveillance program. It is not intended to diagnose infection nor to guide or monitor treatment. Performed at [facility], [address], [place], [place] [address]          Radiology Studies: DG HIP PORT UNILAT W OR W/O PELVIS 1V LEFT Result Date: 09/12/2024 CLINICAL DATA:  Left hip fracture status post ORIF EXAM: DG HIP (WITH OR WITHOUT PELVIS) 1V PORT LEFT COMPARISON:  09/11/2024 FINDINGS: Left proximal femoral nail system in place traversing the intertrochanteric fracture with no complicating feature. Displaced lesser trochanteric component noted. In order to accommodate the IM nail, the proximal two screws of the left lateral femoral plate were removed. The patient also has a contralateral (right) proximal femoral nail system in place. Bony demineralization noted. IMPRESSION: 1. Left proximal femoral nail system in place traversing the  intertrochanteric fracture with no complicating feature. 2. Bony demineralization. Electronically Signed   By: Ryan Salvage M.D.   On: 09/12/2024 13:26   DG FEMUR MIN 2 VIEWS LEFT Result Date: 09/12/2024 CLINICAL DATA:  Intertrochanteric fracture fixation EXAM: LEFT FEMUR 2 VIEWS COMPARISON:  Radiographs 09/11/2024 FINDINGS: Fluoroscopic spot images demonstrate placement of a proximal femoral nail system traversing the intertrochanteric fracture. No complicating feature identified. Pre-existing femoral plate noted along the diaphysis. The proximal femoral plate screws have been removed, likely to accommodate the femoral nail. IMPRESSION: 1. Placement of a proximal femoral nail system traversing the intertrochanteric fracture. Electronically Signed   By: Ryan Salvage M.D.   On: 09/12/2024 13:25   DG C-Arm 1-60 Min-No Report Result Date: 09/12/2024 Fluoroscopy was utilized by the requesting physician.  No radiographic interpretation.   DG Femur Min 2 Views Left Result Date: 09/11/2024 CLINICAL DATA:  Clemens.  Left hip pain. EXAM: LEFT FEMUR 2 VIEWS COMPARISON:  Prior radiographs from 2025 FINDINGS: Long lateral sideplate and compression screws related to a prior distal femur fracture. No complicating features. The left hip is normally located. Nondisplaced fractures. Nondisplaced fractures of the lower femoral neck and also the intertrochanteric region. IMPRESSION: 1. Lower left femoral neck and intertrochanteric fractures, nondisplaced. 2. Remote posttraumatic and postsurgical changes involving the distal femur. Electronically Signed   By: MYRTIS Stammer M.D.   On: 09/11/2024 17:23   DG Hip Unilat With Pelvis 2-3 Views Left Result Date: 09/11/2024 CLINICAL DATA:  Clemens.  Left hip pain. EXAM: DG HIP (WITH OR WITHOUT PELVIS) 2-3V LEFT COMPARISON:  Radiographs from 2022 FINDINGS: Right hip hardware intact. No complicating features. The left hip is normally located. Nondisplaced intertrochanteric  fracture. No definite pelvic fractures. IMPRESSION: Nondisplaced intertrochanteric fracture of the left hip. Electronically Signed   By: MYRTIS Stammer M.D.   On: 09/11/2024 17:22   CT Lumbar Spine Wo Contrast Result Date: 09/11/2024 CLINICAL DATA:  Unwitnessed fall.  Back pain. EXAM: CT LUMBAR SPINE WITHOUT CONTRAST TECHNIQUE: Multidetector CT imaging of the lumbar spine was performed without intravenous contrast administration. Multiplanar CT image reconstructions were also generated. RADIATION DOSE REDUCTION: This exam was performed according to the departmental dose-optimization program which includes automated exposure control, adjustment of the mA and/or kV according to patient size and/or use of iterative reconstruction technique. COMPARISON:  Prior CT scan from 2015 FINDINGS: Segmentation: Could There are five lumbar type vertebral bodies. The last full intervertebral disc space is labeled L5-S1. Alignment: Significant left convex scoliosis but normal overall alignment in the sagittal plane. Vertebrae: Stable fusion hardware at L2-3. No complicating features. No acute lumbar spine fracture. No upper pelvic fractures. There is a displaced fracture involving the right twelfth rib. No transverse process fractures. Paraspinal and other soft tissues: No significant paraspinal findings. Advanced vascular disease but no aneurysm. Severe colonic diverticulosis. Disc levels: No large disc protrusions or significant canal stenosis. IMPRESSION: 1. Displaced fracture involving the posterior right twelfth rib. 2. No acute lumbar spine fracture. 3. Stable fusion hardware at L2-3. 4. Significant left convex scoliosis. 5. No large disc protrusions or significant canal stenosis. 6. Aortic atherosclerosis. Aortic Atherosclerosis (ICD10-I70.0). Electronically Signed   By: MYRTIS Stammer M.D.   On: 09/11/2024 17:20   CT PELVIS WO CONTRAST Result Date: 09/11/2024 CLINICAL DATA:  Clemens.  Left hip pain. EXAM: CT PELVIS WITHOUT  CONTRAST TECHNIQUE: Multidetector CT imaging of the pelvis was performed following the standard protocol without intravenous contrast. RADIATION DOSE REDUCTION: This exam was performed according to the departmental dose-optimization program which includes automated exposure control, adjustment of the mA and/or kV according to patient size and/or use of iterative reconstruction technique. COMPARISON:  Radiographs, same date. FINDINGS: Both hips are normally located. There are nondisplaced fractures of the left femoral neck and intertrochanteric region. The pubic symphysis and SI joints are intact. No pelvic fractures are identified. Right hip hardware related to prior fracture fixation with gamma nail and dynamic hip screw. No complicating features. No significant intrapelvic abnormalities are identified. Severe sigmoid colon diverticulosis. Advanced aortic and iliac artery calcifications but no aneurysm. IMPRESSION: 1. Nondisplaced fractures of the left femoral neck and intertrochanteric region. 2. Right hip hardware related to prior fracture fixation with gamma nail and dynamic hip screw. No complicating features. 3. Severe sigmoid colon diverticulosis. 4. Aortic atherosclerosis. Aortic Atherosclerosis (ICD10-I70.0). Electronically Signed   By: MYRTIS Stammer M.D.   On: 09/11/2024 17:16   CT Head Wo Contrast Result Date: 09/11/2024 CLINICAL DATA:  Unwitnessed fall. EXAM: CT HEAD WITHOUT CONTRAST CT CERVICAL SPINE WITHOUT CONTRAST TECHNIQUE: Multidetector CT imaging of the head and cervical spine was performed following the standard protocol without intravenous contrast. Multiplanar CT image reconstructions of the cervical spine were also generated. RADIATION DOSE REDUCTION: This exam was performed according to the departmental dose-optimization program which includes automated exposure control, adjustment of the mA and/or kV according to patient size and/or use of iterative reconstruction technique. COMPARISON:   04/10/2024 FINDINGS: CT HEAD FINDINGS Brain: Stable age related cerebral atrophy, ventriculomegaly and periventricular white matter disease. No extra-axial fluid collections are identified. No CT findings for acute hemispheric infarction or intracranial hemorrhage. No mass lesions. The brainstem and cerebellum grossly normal. There is a small probable lacunar type infarct in the brainstem. Vascular: Stable vascular calcifications. Skull: No acute skull fracture. Sinuses/Orbits: The paranasal sinuses and mastoid air cells are clear. The globes are intact. Other: No scalp lesion or scalp hematoma. CT CERVICAL SPINE FINDINGS Alignment: Degenerative cervical spondylosis with mild multilevel degenerative subluxations but the overall alignment is maintained. Skull base and vertebrae: No acute fracture. No primary bone lesion or focal pathologic process. Soft tissues and spinal canal: No prevertebral fluid or swelling. No visible canal hematoma. Disc levels: The spinal canal is generous. No significant canal stenosis. Upper chest: No significant findings. Other: Bilateral carotid artery calcifications. Stable right  thyroid  goiter. IMPRESSION: 1. Stable age related cerebral atrophy, ventriculomegaly and periventricular white matter disease. 2. No acute intracranial findings or skull fracture. 3. Degenerative cervical spondylosis with mild multilevel degenerative subluxations but no acute cervical spine fracture. Electronically Signed   By: MYRTIS Stammer M.D.   On: 09/11/2024 17:11   CT Cervical Spine Wo Contrast Result Date: 09/11/2024 CLINICAL DATA:  Unwitnessed fall. EXAM: CT HEAD WITHOUT CONTRAST CT CERVICAL SPINE WITHOUT CONTRAST TECHNIQUE: Multidetector CT imaging of the head and cervical spine was performed following the standard protocol without intravenous contrast. Multiplanar CT image reconstructions of the cervical spine were also generated. RADIATION DOSE REDUCTION: This exam was performed according to the  departmental dose-optimization program which includes automated exposure control, adjustment of the mA and/or kV according to patient size and/or use of iterative reconstruction technique. COMPARISON:  04/10/2024 FINDINGS: CT HEAD FINDINGS Brain: Stable age related cerebral atrophy, ventriculomegaly and periventricular white matter disease. No extra-axial fluid collections are identified. No CT findings for acute hemispheric infarction or intracranial hemorrhage. No mass lesions. The brainstem and cerebellum grossly normal. There is a small probable lacunar type infarct in the brainstem. Vascular: Stable vascular calcifications. Skull: No acute skull fracture. Sinuses/Orbits: The paranasal sinuses and mastoid air cells are clear. The globes are intact. Other: No scalp lesion or scalp hematoma. CT CERVICAL SPINE FINDINGS Alignment: Degenerative cervical spondylosis with mild multilevel degenerative subluxations but the overall alignment is maintained. Skull base and vertebrae: No acute fracture. No primary bone lesion or focal pathologic process. Soft tissues and spinal canal: No prevertebral fluid or swelling. No visible canal hematoma. Disc levels: The spinal canal is generous. No significant canal stenosis. Upper chest: No significant findings. Other: Bilateral carotid artery calcifications. Stable right thyroid  goiter. IMPRESSION: 1. Stable age related cerebral atrophy, ventriculomegaly and periventricular white matter disease. 2. No acute intracranial findings or skull fracture. 3. Degenerative cervical spondylosis with mild multilevel degenerative subluxations but no acute cervical spine fracture. Electronically Signed   By: MYRTIS Stammer M.D.   On: 09/11/2024 17:11   DG Chest Portable 1 View Result Date: 09/11/2024 CLINICAL DATA:  Unwitnessed fall. EXAM: PORTABLE CHEST 1 VIEW COMPARISON:  04/10/2024 FINDINGS: The cardiac silhouette, mediastinal and hilar contours are within normal limits for age and stable.  Stable tortuosity and calcification of the thoracic aorta. No acute pulmonary findings. No pleural effusion, pneumothorax or pulmonary contusion. The bony thorax is intact.  No definite rib fractures. IMPRESSION: No acute cardiopulmonary findings. Electronically Signed   By: MYRTIS Stammer M.D.   On: 09/11/2024 17:05   Scheduled Meds:  aspirin   325 mg Oral Daily   cholecalciferol   1,000 Units Oral Daily   docusate sodium   100 mg Oral BID   famotidine   20 mg Oral QHS   melatonin  6 mg Oral QHS   QUEtiapine   25 mg Oral BID   sodium chloride  flush  3 mL Intravenous Q12H   Continuous Infusions:  cefTRIAXone  (ROCEPHIN )  IV 1 g (09/12/24 1822)     LOS: 2 days    Time spent: 50 mins    Darcel Dawley, MD [facility]   If 7PM-7AM, please contact night-coverage

## 2024-09-13 NOTE — Plan of Care (Signed)
  Problem: Nutrition: Goal: Adequate nutrition will be maintained Outcome: Progressing   Problem: Education: Goal: Knowledge of General Education information will improve Description: Including pain rating scale, medication(s)/side effects and non-pharmacologic comfort measures Outcome: Not Progressing   Problem: Health Behavior/Discharge Planning: Goal: Ability to manage health-related needs will improve Outcome: Not Progressing   Problem: Activity: Goal: Risk for activity intolerance will decrease Outcome: Not Progressing   Problem: Coping: Goal: Level of anxiety will decrease Outcome: Not Progressing   Problem: Pain Managment: Goal: General experience of comfort will improve and/or be controlled Outcome: Not Progressing   Problem: Safety: Goal: Ability to remain free from injury will improve Outcome: Not Progressing

## 2024-09-13 NOTE — Progress Notes (Signed)
 Patient awake this morning and drank of apple juice. Says I don't understand/ Attempted to explain but her daughter has her hearing aids and she still seems confused.

## 2024-09-13 NOTE — Plan of Care (Signed)

## 2024-09-13 NOTE — Evaluation (Signed)
 Physical Therapy Evaluation  Patient Details Name: [name] Barker MRN: [id number] DOB: Mar 08, 1921 Today's Date: 09/13/2024  History of Present Illness  Pt is a 89 year old female who presents 09/11/2024 s/p fall at home, sustaining a L intertrochanteric femur fracture and a displaced R 12th rib fracture. Pt is now s/p cephalomedullary nail of femur fracture on 09/12/2024. PMH significant for Colon cancer, diverticulosis, L femur fracture s/p ORIF 2019, CVA, chronic low back pain, osteopenia, SBO, TIA, back surgery 2007, R IM nail intertrochanteric femur fx 2022, rotator cuff repair, joint replacement (unspecified).   Clinical Impression  Seen in conjunction with OT to optimize potential for safe functional mobility assessment. Pt extremely difficult to rouse upon entry however did open eyes to name several times throughout session. During these times pt not following commands, focusing on items or people in front of her, or attempting to communicate. Pt did engage core on 3 occasions while sitting EOB and attempted to hold herself up, however appeared that pt was pushing away from surgical L hip likely due to discomfort. Pt otherwise required max assist for balance support while sitting EOB. Positioned at end of session with HOB elevated, heels floated and arms supported on stacked blankets for comfort.  No family present throughout session. Will recommend continued rehab <3 hours/day for now, however if family is able to provide care for her at home, would prefer this given pt's advanced age. Will attempt to solidify d/c plan next session.         If plan is discharge home, recommend the following: Two people to help with walking and/or transfers;Two people to help with bathing/dressing/bathroom;Direct supervision/assist for medications management;Direct supervision/assist for financial management;Assist for transportation;Help with stairs or ramp for entrance;Supervision due to cognitive status    Can travel by private vehicle   No    Equipment Recommendations Hoyer lift;Hospital bed  Recommendations for Other Services       Functional Status Assessment Patient has had a recent decline in their functional status and demonstrates the ability to make significant improvements in function in a reasonable and predictable amount of time.     Precautions / Restrictions Precautions Precautions: Fall Recall of Precautions/Restrictions: Impaired Restrictions Weight Bearing Restrictions Per Provider Order: Yes LLE Weight Bearing Per Provider Order: Weight bearing as tolerated      Mobility  Bed Mobility Overal bed mobility: Needs Assistance Bed Mobility: Rolling, Supine to Sit, Sit to Supine Rolling: Total assist, +2 for physical assistance   Supine to sit: Total assist, +2 for physical assistance Sit to supine: Total assist, +2 for physical assistance        Transfers                   General transfer comment: Unable to progress to OOB mobility at this time.    Ambulation/Gait                  Stairs            Wheelchair Mobility     Tilt Bed    Modified Rankin (Stroke Patients Only)       Balance Overall balance assessment: Needs assistance Sitting-balance support: Feet supported, Bilateral upper extremity supported Sitting balance-Leahy Scale: Zero Sitting balance - Comments: Max assist with brief periods of attempting unsupported sitting. R lateral lean, presumably away from painful L hip. Postural control: Posterior lean, Right lateral lean  Pertinent Vitals/Pain Pain Assessment Pain Assessment: Faces Faces Pain Scale: Hurts a little bit Pain Location: L hip Pain Descriptors / Indicators: Grimacing, Moaning Pain Intervention(s): Limited activity within patient's tolerance, Monitored during session, Repositioned    Home Living Family/patient expects to be discharged to::  Private residence                   Additional Comments: Per chart review pt living with family. Pt unable to answer any questions about home set up at this time.    Prior Function Prior Level of Function : Patient poor historian/Family not available             Mobility Comments: Per chart review pt ambulating with a RW but unsure to what extent pt was ambulatory.       Extremity/Trunk Assessment   Upper Extremity Assessment Upper Extremity Assessment: Difficult to assess due to impaired cognition    Lower Extremity Assessment Lower Extremity Assessment: Difficult to assess due to impaired cognition    Cervical / Trunk Assessment Cervical / Trunk Assessment: Kyphotic  Communication   Communication Communication: Impaired Factors Affecting Communication: Hearing impaired    Cognition Arousal: Stuporous Behavior During Therapy: Flat affect   PT - Cognitive impairments: Difficult to assess Difficult to assess due to: Level of arousal                       Following commands: Impaired Following commands impaired:  (Does not follow commands)     Cueing Cueing Techniques: Verbal cues, Gestural cues, Tactile cues     General Comments      Exercises     Assessment/Plan    PT Assessment Patient needs continued PT services  PT Problem List Decreased strength;Decreased range of motion;Decreased activity tolerance;Decreased balance;Decreased mobility;Decreased coordination;Decreased cognition;Decreased knowledge of use of DME;Decreased safety awareness;Decreased knowledge of precautions;Pain       PT Treatment Interventions DME instruction;Gait training;Functional mobility training;Therapeutic activities;Therapeutic exercise;Balance training;Patient/family education;Wheelchair mobility training    PT Goals (Current goals can be found in the Care Plan section)  Acute Rehab PT Goals PT Goal Formulation: Patient unable to participate in goal  setting Time For Goal Achievement: 09/27/24 Potential to Achieve Goals: Poor    Frequency Min 2X/week     Co-evaluation PT/OT/SLP Co-Evaluation/Treatment: Yes Reason for Co-Treatment: Complexity of the patient's impairments (multi-system involvement);Necessary to address cognition/behavior during functional activity;For patient/therapist safety;To address functional/ADL transfers PT goals addressed during session: Mobility/safety with mobility;Balance;Strengthening/ROM         AM-PAC PT 6 Clicks Mobility  Outcome Measure Help needed turning from your back to your side while in a flat bed without using bedrails?: Total Help needed moving from lying on your back to sitting on the side of a flat bed without using bedrails?: Total Help needed moving to and from a bed to a chair (including a wheelchair)?: Total Help needed standing up from a chair using your arms (e.g., wheelchair or bedside chair)?: Total Help needed to walk in hospital room?: Total Help needed climbing 3-5 steps with a railing? : Total 6 Click Score: 6    End of Session Equipment Utilized During Treatment: Oxygen Activity Tolerance: Patient limited by lethargy Patient left: in bed;with call bell/phone within reach;with bed alarm set Nurse Communication: Mobility status;Need for lift equipment PT Visit Diagnosis: Muscle weakness (generalized) (M62.81);Difficulty in walking, not elsewhere classified (R26.2)    Time: 8591-8566 PT Time Calculation (min) (ACUTE ONLY): 25 min   Charges:  PT Evaluation $PT Eval Moderate Complexity: 1 Mod   PT General Charges $$ ACUTE PT VISIT: 1 Visit         Leita Sable, PT, DPT Acute Rehabilitation Services Secure Chat Preferred Office: [phone]   Leita JONETTA Sable 09/13/2024, 3:13 PM

## 2024-09-13 NOTE — Progress Notes (Signed)
 Awake and alert but confused, at this time.

## 2024-09-13 NOTE — Evaluation (Addendum)
 Occupational Therapy Evaluation Patient Details Name: [name] Barker MRN: [id number] DOB: 08/17/1921 Today's Date: 09/13/2024   History of Present Illness   Pt is a 89 year old female who presents 09/11/2024 s/p fall at home, sustaining a L intertrochanteric femur fracture and a displaced R 12th rib fracture. S/p cephalomedullary nail of femur fx on 09/12/2024. PMH significant for Colon cancer, diverticulosis, L femur fracture s/p ORIF 2019, CVA, chronic low back pain, osteopenia, SBO, TIA, back surgery 2007, R IM nail intertrochanteric femur fx 2022, rotator cuff repair, joint replacement (unspecified).     Clinical Impressions Upon eval, pt lethargic, unable to arouse consistently for providing history and not following any commands this session; per chart, pt very hard of hearing. Pt currently total A for bed mobility and washing face EOB. Pt with brief periods of arousal and with eye opening but limited localization of any stimulus. Unsure pt baseline. Hopeful for greater level of arousal and significant progress in future sessions, as pt with some noted righting reactions at EOB this session. Called daughter at end of session who reports pt typically ambulatory with RW and walks to nearby family houses with RW with daughter pushing wheelchair behind in case pt fatigues. Given pt age and from home, believe this would be best discharge destination for pt; per daughter on phone; she is prepared to care for pt at any functional level.      If plan is discharge home, recommend the following:   Other (comment) (total)     Functional Status Assessment   Patient has had a recent decline in their functional status and demonstrates the ability to make significant improvements in function in a reasonable and predictable amount of time.     Equipment Recommendations   Other (comment) (defer)     Recommendations for Other Services         Precautions/Restrictions    Precautions Precautions: Fall Recall of Precautions/Restrictions: Impaired Restrictions Weight Bearing Restrictions Per Provider Order: Yes LLE Weight Bearing Per Provider Order: Weight bearing as tolerated     Mobility Bed Mobility Overal bed mobility: Needs Assistance Bed Mobility: Rolling, Supine to Sit, Sit to Supine Rolling: Total assist, +2 for physical assistance   Supine to sit: Total assist, +2 for physical assistance Sit to supine: Total assist, +2 for physical assistance        Transfers                   General transfer comment: Unable to progress to OOB mobility at this time.      Balance Overall balance assessment: Needs assistance Sitting-balance support: Feet supported, Bilateral upper extremity supported Sitting balance-Leahy Scale: Zero Sitting balance - Comments: brief periods of righting reactions and attempts to reposition at EOB but otherwise total A Postural control: Posterior lean, Right lateral lean                                 ADL either performed or assessed with clinical judgement   ADL Overall ADL's : Needs assistance/impaired     Grooming: Total assistance;Sitting                                 General ADL Comments: total A throughout session without noted active participation in session     Vision   Additional Comments: eyes closed for most of session  Perception         Praxis         Pertinent Vitals/Pain Pain Assessment Pain Assessment: Faces Faces Pain Scale: Hurts a little bit Pain Location: L hip Pain Descriptors / Indicators: Grimacing, Moaning Pain Intervention(s): Limited activity within patient's tolerance, Monitored during session     Extremity/Trunk Assessment Upper Extremity Assessment Upper Extremity Assessment: Difficult to assess due to impaired cognition (predominantly due to level of arousal)   Lower Extremity Assessment Lower Extremity Assessment: Defer to  PT evaluation   Cervical / Trunk Assessment Cervical / Trunk Assessment: Kyphotic   Communication Communication Communication: Impaired Factors Affecting Communication: Hearing impaired   Cognition Arousal: Stuporous Behavior During Therapy: Flat affect               OT - Cognition Comments: pt very hard of hearing per chart and intermittently opening eyes ~3-4 times during session, with limited attempts at visual localization. pt not following any commands this session; suspect due to lethargy                 Following commands: Impaired Following commands impaired:  (not following commands this session)     Cueing  General Comments   Cueing Techniques: Verbal cues;Gestural cues;Tactile cues  pt needing up to 2L supplemental O2 via [place] to maintain SpO2 >90% during session and at rest   Exercises     Shoulder Instructions      Home Living Family/patient expects to be discharged to:: Private residence                                 Additional Comments: Per chart review pt living with family. Pt unable to answer any questions about home set up at this time.      Prior Functioning/Environment Prior Level of Function : Patient poor historian/Family not available             Mobility Comments: Per chart review pt ambulating with a RW but unsure to what extent pt was ambulatory.      OT Problem List: Decreased strength;Decreased activity tolerance;Pain   OT Treatment/Interventions: Self-care/ADL training;Therapeutic exercise;DME and/or AE instruction;Therapeutic activities;Patient/family education;Balance training      OT Goals(Current goals can be found in the care plan section)   Acute Rehab OT Goals Patient Stated Goal: unable OT Goal Formulation: Patient unable to participate in goal setting Time For Goal Achievement: 09/27/24 Potential to Achieve Goals: Good   OT Frequency:  Min 2X/week    Co-evaluation PT/OT/SLP  Co-Evaluation/Treatment: Yes Reason for Co-Treatment: Complexity of the patient's impairments (multi-system involvement);Necessary to address cognition/behavior during functional activity;For patient/therapist safety;To address functional/ADL transfers PT goals addressed during session: Mobility/safety with mobility;Balance;Strengthening/ROM OT goals addressed during session: ADL's and self-care      AM-PAC OT 6 Clicks Daily Activity     Outcome Measure Help from another person eating meals?: Total Help from another person taking care of personal grooming?: Total Help from another person toileting, which includes using toliet, bedpan, or urinal?: Total Help from another person bathing (including washing, rinsing, drying)?: Total Help from another person to put on and taking off regular upper body clothing?: Total Help from another person to put on and taking off regular lower body clothing?: Total 6 Click Score: 6   End of Session Equipment Utilized During Treatment: Oxygen Nurse Communication: Mobility status  Activity Tolerance: Patient limited by lethargy Patient left: in bed;with call  bell/phone within reach;with bed alarm set;with restraints reapplied  OT Visit Diagnosis: Muscle weakness (generalized) (M62.81);Pain                Time: 8591-8566 OT Time Calculation (min): 25 min Charges:  OT General Charges $OT Visit: 1 Visit OT Evaluation $OT Eval Moderate Complexity: 1 Mod  [name] Barker FREDERICK, OTR/L [facility] Acute Rehabilitation Office: [phone]   [name] Barker 09/13/2024, 4:00 PM

## 2024-09-14 DIAGNOSIS — S72002A Fracture of unspecified part of neck of left femur, initial encounter for closed fracture: Secondary | ICD-10-CM | POA: Diagnosis not present

## 2024-09-14 LAB — CBC
HCT: 24.2 % — ABNORMAL LOW (ref 36.0–46.0)
HCT: 26.3 % — ABNORMAL LOW (ref 36.0–46.0)
Hemoglobin: 7.7 g/dL — ABNORMAL LOW (ref 12.0–15.0)
Hemoglobin: 8.3 g/dL — ABNORMAL LOW (ref 12.0–15.0)
MCH: 28.9 pg (ref 26.0–34.0)
MCH: 29 pg (ref 26.0–34.0)
MCHC: 31.8 g/dL (ref 30.0–36.0)
MCHC: 31.6 g/dL (ref 30.0–36.0)
MCV: 91 fL (ref 80.0–100.0)
MCV: 92 fL (ref 80.0–100.0)
Platelets: 174 K/uL (ref 150–400)
Platelets: 189 K/uL (ref 150–400)
RBC: 2.66 MIL/uL — ABNORMAL LOW (ref 3.87–5.11)
RBC: 2.86 MIL/uL — ABNORMAL LOW (ref 3.87–5.11)
RDW: 15.5 % (ref 11.5–15.5)
RDW: 15.4 % (ref 11.5–15.5)
WBC: 8.1 K/uL (ref 4.0–10.5)
WBC: 7.6 K/uL (ref 4.0–10.5)
nRBC: 0 % (ref 0.0–0.2)
nRBC: 0 % (ref 0.0–0.2)

## 2024-09-14 LAB — BASIC METABOLIC PANEL WITH GFR
Anion gap: 14 (ref 5–15)
BUN: 38 mg/dL — ABNORMAL HIGH (ref 8–23)
CO2: 23 mmol/L (ref 22–32)
Calcium: 8.1 mg/dL — ABNORMAL LOW (ref 8.9–10.3)
Chloride: 99 mmol/L (ref 98–111)
Creatinine, Ser: 0.95 mg/dL (ref 0.44–1.00)
GFR, Estimated: 53 mL/min — ABNORMAL LOW (ref >60)
Glucose, Bld: 97 mg/dL (ref 70–99)
Potassium: 4.1 mmol/L (ref 3.5–5.1)
Sodium: 136 mmol/L (ref 135–145)

## 2024-09-14 LAB — MAGNESIUM: Magnesium: 2.7 mg/dL — ABNORMAL HIGH (ref 1.7–2.4)

## 2024-09-14 MED ORDER — OXYCODONE HCL 5 MG PO TABS: 2.5000 mg | ORAL_TABLET | ORAL | 0 refills | Status: AC | PRN

## 2024-09-14 MED ORDER — DOCUSATE SODIUM 50 MG/5ML PO LIQD: 100.0000 mg | Freq: Two times a day (BID) | ORAL | 0 refills | Status: AC

## 2024-09-14 MED ORDER — BUSPIRONE HCL 5 MG PO TABS
5.0000 mg | ORAL_TABLET | Freq: Two times a day (BID) | ORAL | Status: DC | PRN
Start: 2024-09-14 — End: 2024-11-02

## 2024-09-14 MED ORDER — ENSURE PLUS HIGH PROTEIN PO LIQD
237.0000 mL | Freq: Three times a day (TID) | ORAL | Status: DC
  Administered 2024-09-14: 237 mL via ORAL

## 2024-09-14 MED ORDER — ASPIRIN 325 MG PO TABS: 325.0000 mg | ORAL_TABLET | Freq: Every day | ORAL | 0 refills | Status: AC

## 2024-09-14 MED ORDER — VITAMIN D3 25 MCG PO TABS: 1000.0000 [IU] | ORAL_TABLET | Freq: Every day | ORAL | 0 refills | Status: AC

## 2024-09-14 MED ORDER — CEFUROXIME AXETIL 250 MG PO TABS: 250.0000 mg | ORAL_TABLET | Freq: Two times a day (BID) | ORAL | 0 refills | Status: AC

## 2024-09-14 MED ORDER — ENSURE PLUS HIGH PROTEIN PO LIQD: 237.0000 mL | Freq: Three times a day (TID) | ORAL | Status: AC

## 2024-09-14 NOTE — Progress Notes (Addendum)
 PT Cancellation Note  Patient Details Name: [name] Barker MRN: [id number] DOB: 06-Oct-1921   Cancelled Treatment:    Reason Eval/Treat Not Completed: Fatigue/lethargy limiting ability to participate. Max attempts to rouse patient without success. Pt opened eyes briefly to name but began snoring again soon after. Pt continues to be unable to follow commands, visually track, or attempt communication with therapists. Per CM daughter interested in HHPT follow up and does not feel they need hospital bed or [facility] lift. Declining SNF. Recs updated. Will continue to follow and progress as able per POC.    Leita JONETTA Sable 09/14/2024, 11:40 AM  Leita Sable, PT, DPT Acute Rehabilitation Services Secure Chat Preferred Office: [phone]

## 2024-09-14 NOTE — TOC CM/SW Note (Signed)
 Patient asleep , no family at bedside.   Per OT yesterday daughter prefers to take her mother home at discharge with Home health. They have used [facility] in the past.    PT recommending hospital bed and hoyer lift and ambulance transport home   NCM called daughter Ronal Freud [phone] and left voicemail. Await call back.

## 2024-09-14 NOTE — Progress Notes (Signed)
 Pt daughter Alston) request call from hospitalist when rounding to discuss DC-in hopes of DC today, but will need clinical transport to home.

## 2024-09-14 NOTE — Progress Notes (Signed)
 OT Cancellation Note  Patient Details Name: [name] Barker MRN: [id number] DOB: December 16, 1921   Cancelled Treatment:    Reason Eval/Treat Not Completed: (P) Fatigue/lethargy limiting ability to participate, Attempted multiple times with PT to arouse Pt, unable. Will return as time allows  [name] 09/14/2024, 11:35 AM

## 2024-09-14 NOTE — Progress Notes (Signed)
 Orthopaedic Trauma Progress Note  SUBJECTIVE: Patient doing okay this morning.  Awake and alert. Patient states she is not having much pain at rest.  Was able to mobilize to EOB with the assistance of PT/OT yesterday.  No family at bedside currently but it sounds like family is able to provide care for patient at home and would prefer for her to return home at discharge versus SNF.  OBJECTIVE:  Vitals:   09/14/24 0834 09/14/24 0926  BP: (!) 98/50 104/72  Pulse: 63 71  Resp: 16   Temp:  97.6 F (36.4 C)  SpO2: 98% (!) 87%    Opiates Today (MME): Today's  total administered Morphine  Milligram Equivalents: 0 Opiates Yesterday (MME): Yesterday's total administered Morphine  Milligram Equivalents: 7.5  General: Sitting up in bed comfortably, no acute distress Respiratory: No increased work of breathing.  Operative Extremity (left lower extremity): Dressings over the lateral hip/thigh are clean, dry, intact.  Winces in discomfort with palpation over the lateral hip as expected.  Otherwise does not show any signs of pain/discomfort with palpation to the thigh or lower leg.  No obvious signs of calf tenderness.  Tolerates gentle ankle range of motion.  Able to wiggle the toes slightly.  Skin warm and dry.  + DP pulse  IMAGING: Stable post op imaging left hip  LABS:  Results for orders placed or performed during the hospital encounter of 09/11/24 (from the past 24 hours)  CBC     Status: Abnormal   Collection Time: 09/14/24  3:12 AM  Result Value Ref Range   WBC 8.1 4.0 - 10.5 K/uL   RBC 2.66 (L) 3.87 - 5.11 MIL/uL   Hemoglobin 7.7 (L) 12.0 - 15.0 g/dL   HCT 75.7 (L) 63.9 - 53.9 %   MCV 91.0 80.0 - 100.0 fL   MCH 28.9 26.0 - 34.0 pg   MCHC 31.8 30.0 - 36.0 g/dL   RDW 84.4 88.4 - 84.4 %   Platelets 174 150 - 400 K/uL   nRBC 0.0 0.0 - 0.2 %  CBC     Status: Abnormal   Collection Time: 09/14/24  9:11 AM  Result Value Ref Range   WBC 7.6 4.0 - 10.5 K/uL   RBC 2.86 (L) 3.87 - 5.11 MIL/uL    Hemoglobin 8.3 (L) 12.0 - 15.0 g/dL   HCT 73.6 (L) 63.9 - 53.9 %   MCV 92.0 80.0 - 100.0 fL   MCH 29.0 26.0 - 34.0 pg   MCHC 31.6 30.0 - 36.0 g/dL   RDW 84.5 88.4 - 84.4 %   Platelets 189 150 - 400 K/uL   nRBC 0.0 0.0 - 0.2 %  Magnesium      Status: Abnormal   Collection Time: 09/14/24  9:11 AM  Result Value Ref Range   Magnesium  2.7 (H) 1.7 - 2.4 mg/dL  Basic metabolic panel with GFR     Status: Abnormal   Collection Time: 09/14/24  9:11 AM  Result Value Ref Range   Sodium 136 135 - 145 mmol/L   Potassium 4.1 3.5 - 5.1 mmol/L   Chloride 99 98 - 111 mmol/L   CO2 23 22 - 32 mmol/L   Glucose, Bld 97 70 - 99 mg/dL   BUN 38 (H) 8 - 23 mg/dL   Creatinine, Ser 9.04 0.44 - 1.00 mg/dL   Calcium 8.1 (L) 8.9 - 10.3 mg/dL   GFR, Estimated 53 (L) >60 mL/min   Anion gap 14 5 - 15    ASSESSMENT: [name]  G Barker is a 89 y.o. female, 2 Days Post-Op s/p fall Procedures:  CEPHALOMEDULLARY NAILING LEFT INTERTROCHANTERIC FEMUR FRACTURE  HARDWARE REMOVAL LEFT FEMUR  CV/Blood loss: Acute blood loss anemia, Hgb 8.3 this AM. Hemodynamically stable  PLAN: Weightbearing: WBAT LLE ROM: Unrestricted ROM Incisional and dressing care: Okay to remove dressings and leave incisions open to air Showering: Ok to begin getting incisions wet starting 09/15/2024 Orthopedic device(s): None  Pain management:  1. Tylenol  1000 mg q 6 hours PRN 2. Robaxin  500 mg q 8 hours PRN 3. Oxycodone  2.5-5 mg q 4 hours PRN 4. Dilaudid  0.5 mg q 4 hours PRN VTE prophylaxis: Aspirin , SCDs ID: Patient on Rocephin  for UTI.  No additional antibiotics needed from ortho standpoint Foley/Lines:  No foley, KVO IVFs Impediments to Fracture Healing: Vitamin D  level 23, will start patient on supplementation Dispo: PT/OT evaluation ongoing.  Plans for patient to return home with family at discharge.  Okay for discharge from ortho standpoint once cleared by medicine team and therapies.  I have sent discharge Rx for pain  medication, aspirin , vitamin D  to patient's pharmacy on file  D/C recommendations: - Tylenol , oxycodone  2.5 mg PRN for pain control - Aspirin  325 mg daily x 30 days for DVT prophylaxis - Continue 1000 units of vitamin D3 supplementation daily  Follow - up plan: 2 weeks after d/c for wound check and repeat x-rays   Contact information:  Franky Light MD, Lauraine Moores PA-C. After hours and holidays please check [email or web address] for group call information for [facility]   Lauraine PATRIC Moores, PA-C [phone] (office) [email or web address]

## 2024-09-14 NOTE — TOC Initial Note (Addendum)
 Transition of Care (TOC) - Initial/Assessment Note   Patient asleep, no family at bedside.   Daughter Ronal Evan called NCM back.   Ronal prefers to have her mother return to home at discharge with HHPT/OT with [facility], instead of rehab at a SNF.   She is aware that patient was unable to work with therapy today due to fatigue / lethargy. PT recommending a hospital bed and a hoyer lift.   Ronal states they do not need a hoyer lift , they have already been picking patient up and moving her. Also Ronal decline need for hospital bed. Patient already has a bed with rails and family are comfortable moving her .     Ronal would like HHPT/OT with [facility], and her mother to be discharged to home ASAP.   Ronal does want ambulance transport home for her mother. [name] confirmed address.   NCM sent message to team   Patient being discharged. MD spoke to daughter. PT[facility] called 4 patients ahead of her. Daughter aware . Daughter would like nurse to call her when [facility] picks her up  Patient Details  Name: [name] Barker MRN: [id number] Date of Birth: Jan 04, 1921  Transition of Care [facility]) CM/SW Contact:    Stephane Powell Jansky, RN Phone Number: 09/14/2024, 12:23 PM  Clinical Narrative:                   Expected Discharge Plan: Home w Home Health Services Barriers to Discharge: Continued Medical Work up   Patient Goals and CMS Choice Patient states their goals for this hospitalization and ongoing recovery are:: asleep CMS Medicare.gov Compare Post Acute Care list provided to:: Patient Represenative (must comment) (daughter Ronal Freud) Choice offered to / list presented to : Adult Children      Expected Discharge Plan and Services   Discharge Planning Services: CM Consult Post Acute Care Choice: Home Health Living arrangements for the past 2 months: Single Family Home                 DME Arranged:  (see note)         HH Arranged: PT, OT HH Agency: [facility] Date [facility] Agency  Contacted: 09/14/24 Time HH Agency Contacted: 1222 Representative spoke with at [facility] Agency: Darleene  Prior Living Arrangements/Services Living arrangements for the past 2 months: Single Family Home Lives with:: Adult Children Patient language and need for interpreter reviewed:: Yes        Need for Family Participation in Patient Care: Yes (Comment) Care giver support system in place?: Yes (comment) Current home services: DME Criminal Activity/Legal Involvement Pertinent to Current Situation/Hospitalization: No - Comment as needed  Activities of Daily Living   ADL Screening (condition at time of admission) Independently performs ADLs?: No Does the patient have a NEW difficulty with bathing/dressing/toileting/self-feeding that is expected to last >3 days?: Yes (Initiates electronic notice to provider for possible OT consult) Does the patient have a NEW difficulty with getting in/out of bed, walking, or climbing stairs that is expected to last >3 days?: Yes (Initiates electronic notice to provider for possible PT consult) Does the patient have a NEW difficulty with communication that is expected to last >3 days?: No Is the patient deaf or have difficulty hearing?: Yes Does the patient have difficulty seeing, even when wearing glasses/contacts?: No Does the patient have difficulty concentrating, remembering, or making decisions?: Yes  Permission Sought/Granted   Permission granted to share information with : Yes, Verbal Permission Granted  Share Information with NAME: daughter Ronal Freud  Permission granted to share info w AGENCY: [facility]        Emotional Assessment Appearance:: Appears stated age Attitude/Demeanor/Rapport: Unable to Assess     Alcohol / Substance Use: Not Applicable Psych Involvement: No (comment)  Admission diagnosis:  Acute cystitis without hematuria [N30.00] Closed left hip fracture, initial encounter (HCC) [S72.002A] Closed fracture of one rib of right side,  initial encounter [S22.31XA] Closed fracture of left femur, unspecified fracture morphology, unspecified portion of femur, initial encounter [facility]) [S72.92XA] Patient Active Problem List   Diagnosis Date Noted   Closed left hip fracture, initial encounter (HCC) 09/11/2024   Closed intertrochanteric fracture of right femur (HCC) 03/14/2021   DNR (do not resuscitate)    Osteoarthritis of right knee 04/27/2019   Hx of completed stroke 03/31/2019   Vasomotor rhinitis 11/17/2018   Irregular heartbeat 09/21/2017   Dementia (HCC) 08/10/2017   Bilateral hearing loss 08/07/2016   Depression 08/07/2016   Vitamin D  deficiency 11/04/2015   Routine general medical examination at a health care facility 06/08/2015   CAD 06/21/2010   ADENOCARCINOMA, COLON 08/19/2007   Hyperlipidemia 11/17/2006   GERD 11/17/2006   PCP:  Rollene Almarie LABOR, MD Pharmacy:   [facility] [address], [place] - [address] [place] [address] Phone: [phone] Fax: [phone]     Social Drivers of Health (SDOH) Social History: SDOH Screenings   Food Insecurity: No Food Insecurity (09/12/2024)  Housing: Low Risk  (09/12/2024)  Transportation Needs: No Transportation Needs (09/12/2024)  Utilities: Not At Risk (09/12/2024)  Alcohol Screen: Low Risk  (06/07/2024)  Depression (PHQ2-9): Medium Risk (08/31/2024)  Financial Resource Strain: Low Risk  (06/07/2024)  Physical Activity: Inactive (06/07/2024)  Social Connections: Moderately Isolated (09/12/2024)  Stress: Patient Unable To Answer (06/07/2024)  Tobacco Use: Low Risk  (09/12/2024)  Health Literacy: Inadequate Health Literacy (06/07/2024)   SDOH Interventions:     Readmission Risk Interventions     No data to display

## 2024-09-15 ENCOUNTER — Telehealth: Payer: Self-pay

## 2024-09-15 NOTE — Discharge Summary (Signed)
 Physician Discharge Summary   Patient: [name] Barker MRN: [id number] DOB: 03/12/1921  Admit date:     09/11/2024  Discharge date: 09/14/2024  Discharge Physician: Yetta Blanch  PCP: Rollene Almarie LABOR, MD  Recommendations at discharge: Follow-up with PCP in 1 week. Follow-up with orthopedic as recommended. CBC and BMP in 1 week.   Follow-up Information     Haddix, Franky SQUIBB, MD. Schedule an appointment as soon as possible for a visit in 2 week(s).   Specialty: Orthopedic Surgery Why: for wound check and repeat x-rays Contact information: [address] [place] [address] [phone]         Care, [facility] Follow up.   Specialty: Home Health Services Contact information: [address] [place] [address] [phone]         Rollene Almarie LABOR, MD. Schedule an appointment as soon as possible for a visit in 1 week(s).   Specialty: Internal Medicine Why: with BMP lab to look at kidney/electrolyte numbers, with CBC lab to look at blood counts Contact information: [address] [place] [address] [phone]                Hospital Course: This 89 years old female with PMH significant for Alzheimer's dementia, intermittent agitation, history of recurrent falls, CVA, history of irregular heartbeat without diagnosis of arrhythmias, Iron deficiency anemia, remote history of colonic adenocarcinoma status post partial colectomy, adjuvant chemotherapy, hyperlipidemia presented status post mechanical fall with left femoral neck and intertrochanteric fracture.  Patient was admitted for further evaluation. Orthopedics is consulted.  Patient is status post ORIF.  POD #1.    Assessment & Plan: Left femoral neck intertrochanteric fracture: Status post mechanical fall: R 12 rib fracture, displaced : Orthopedic surgery consulted, status post ORIF. POD # 1 Adequate pain control with Tylenol , Robaxin , oxycodone  and Dilaudid  as  needed WBAT LLE PT and OT evaluation. Continue vitamin D  supplementation.   Hx irregular heartbeat without diagnosed arrhythmia: EKG shows sinus tachycardia, left anterior fascicular block. She appears to be sinus tach with frequent PAC + compensatory pauses,    UTI, uncomplicated : Given 3 days of antibiotic in the hospital. Will continue upon discharge oral antibiotics.   Alzheimers with behavioral disturbance:  I discussed with daughter, patient takes Seroquel  1/4 over 25 mg tablet once a week as needed only. Patient was given 25 mg Seroquel  twice daily here in the hospital. Patient's drowsiness is likely explained by that. Patient has significantly poor p.o. intake secondary to the same.   Hx CVA:  She is not on antiPLT, statin  Started on aspirin  325 daily for DVT prophylaxis  Mild renal dysfunction.  Versus AKI. Baseline serum creatinine 0.6. Serum creatinine on 9/17 0.95. Likely from poor p.o. intake. Recommended IV hydration, family decided to switch to oral hydration.  Acute blood loss anemia postop. Expected blood loss. Hemoglobin dropped down to 7.7 although on recheck was around 8.3 which is expected from baseline of 10.7. Recommend close follow-up with PCP with repeat CBC.  Disposition. On 9/17 at the time of my evaluation patient is significantly drowsy and lethargic, unable to swallow safely and unable to follow commands consistently. Appears to be clinically dry. Recommended to provide IV hydration and observe overnight for improvement in mentation as well as oral intake. Daughter was requesting and adamant that the patient should be discharged home as she thinks that the patient will do better at home with 24/7 caregivers with people who she is  already familiar with and will be safer with medicines that family is able to administer without any problem up until now. Daughter understands the gravity of the situation with failure to adequately hydrate the  patient. PT OT recommended hospital.  Also daughter thinks that the bed that she has at home will be adequate. Based on his family's insistence as well as otherwise hemodynamic stability, I will discharge the patient home although recommended strict return precaution and recommended repeat CBC BMP sometime this week. Daughter verbalized understanding.   Pain control - [place]  Controlled Substance Reporting System database was reviewed. and patient was instructed, not to drive, operate heavy machinery, perform activities at heights, swimming or participation in water  activities or provide baby-sitting services while on Pain, Sleep and Anxiety Medications; until their outpatient Physician has advised to do so again. Also recommended to not to take more than prescribed Pain, Sleep and Anxiety Medications.  Consultants:  Orthopedic surgery  Procedures performed:  CPT 27245-Cephalomedullary nailing of left intertrochanteric femur fracture CPT 20680-Removal of hardware left femur  DISCHARGE MEDICATION: Allergies as of 09/14/2024       Reactions   Morphine  And Codeine Other (See Comments)   Blisters at site and along vein   Flexeril  [cyclobenzaprine ] Other (See Comments)   Nightmares   Methenamine Mandelate Other (See Comments)   Burning sensation   Penicillins Hives   Did it involve swelling of the face/tongue/throat, SOB, or low BP? Yes Did it involve sudden or severe rash/hives, skin peeling, or any reaction on the inside of your mouth or nose? Unk Did you need to seek medical attention at a hospital or doctor's office? Unk When did it last happen? Childhood If all above answers are "NO", may proceed with cephalosporin use.   Promethazine Hcl Other (See Comments)   Hallucinations   Statins Other (See Comments)   Muscle pain    Ativan  [lorazepam ] Anxiety   Morphine  Sulfate Rash, Other (See Comments)   Blisters all over body        Medication List     PAUSE taking these  medications    traZODone  50 MG tablet Wait to take this until your doctor or other care provider tells you to start again. Commonly known as: DESYREL  Take 1 tablet (50 mg total) by mouth at bedtime.       TAKE these medications    acetaminophen  500 MG tablet Commonly known as: TYLENOL  Take 500 mg by mouth 2 (two) times a day. FOR BACK PAIN   albuterol  108 (90 Base) MCG/ACT inhaler Commonly known as: VENTOLIN  HFA INHALE 2 PUFFS BY MOUTH EVERY 6 HOURS AS NEEDED FOR WHEEZING OR FOR SHORTNESS OF BREATH What changed:  how much to take how to take this when to take this reasons to take this   aspirin  325 MG tablet Take 1 tablet (325 mg total) by mouth daily.   busPIRone  5 MG tablet Commonly known as: BUSPAR  Take 1 tablet (5 mg total) by mouth 2 (two) times daily as needed. What changed:  when to take this reasons to take this   cefUROXime  250 MG tablet Commonly known as: CEFTIN  Take 1 tablet (250 mg total) by mouth 2 (two) times daily with a meal for 3 days.   docusate 50 MG/5ML liquid Commonly known as: COLACE Take 10 mLs (100 mg total) by mouth 2 (two) times daily.   famotidine  20 MG tablet Commonly known as: Pepcid  Take 1 tablet (20 mg total) by mouth at bedtime.  feeding supplement Liqd Take 237 mLs by mouth 3 (three) times daily between meals.   fluticasone  50 MCG/ACT nasal spray Commonly known as: FLONASE  Place 1 spray into both nostrils in the morning and at bedtime.   oxyCODONE  5 MG immediate release tablet Commonly known as: Oxy IR/ROXICODONE  Take 0.5-1 tablets (2.5-5 mg total) by mouth every 4 (four) hours as needed for severe pain (pain score 7-10).   QUEtiapine  25 MG tablet Commonly known as: SEROquel  Take 0.5-1 tablets (12.5-25 mg total) by mouth 2 (two) times daily as needed (agitation).   vitamin D3 25 MCG tablet Commonly known as: CHOLECALCIFEROL  Take 1 tablet (1,000 Units total) by mouth daily.               Discharge Care  Instructions  (From admission, onward)           Start     Ordered   09/14/24 0000  No dressing needed        09/14/24 1323           Disposition: Home  Diet recommendation: Cardiac diet  Discharge Exam: Vitals:   09/14/24 0400 09/14/24 0832 09/14/24 0834 09/14/24 0926  BP: (!) 102/51 (!) 146/135 (!) 98/50 104/72  Pulse: 68 62 63 71  Resp: 16 16 16    Temp: 97.9 F (36.6 C) (!) 97.5 F (36.4 C)  97.6 F (36.4 C)  TempSrc: Axillary Oral    SpO2: 93% 97% 98% (!) 87%  Weight:      Height:       Clear to auscultation Bowel sound present Drowsy during evaluation, but easily awaken.  Unable to follow commands.  Speech incoherent. Filed Weights   09/11/24 1522 09/11/24 2224 09/12/24 0725  Weight: 45.8 kg 42.3 kg 42.3 kg   Condition at discharge: stable  The results of significant diagnostics from this hospitalization (including imaging, microbiology, ancillary and laboratory) are listed below for reference.   Imaging Studies: DG HIP PORT UNILAT W OR W/O PELVIS 1V LEFT Result Date: 09/12/2024 CLINICAL DATA:  Left hip fracture status post ORIF EXAM: DG HIP (WITH OR WITHOUT PELVIS) 1V PORT LEFT COMPARISON:  09/11/2024 FINDINGS: Left proximal femoral nail system in place traversing the intertrochanteric fracture with no complicating feature. Displaced lesser trochanteric component noted. In order to accommodate the IM nail, the proximal two screws of the left lateral femoral plate were removed. The patient also has a contralateral (right) proximal femoral nail system in place. Bony demineralization noted. IMPRESSION: 1. Left proximal femoral nail system in place traversing the intertrochanteric fracture with no complicating feature. 2. Bony demineralization. Electronically Signed   By: Ryan Salvage M.D.   On: 09/12/2024 13:26   DG FEMUR MIN 2 VIEWS LEFT Result Date: 09/12/2024 CLINICAL DATA:  Intertrochanteric fracture fixation EXAM: LEFT FEMUR 2 VIEWS COMPARISON:   Radiographs 09/11/2024 FINDINGS: Fluoroscopic spot images demonstrate placement of a proximal femoral nail system traversing the intertrochanteric fracture. No complicating feature identified. Pre-existing femoral plate noted along the diaphysis. The proximal femoral plate screws have been removed, likely to accommodate the femoral nail. IMPRESSION: 1. Placement of a proximal femoral nail system traversing the intertrochanteric fracture. Electronically Signed   By: Ryan Salvage M.D.   On: 09/12/2024 13:25   DG C-Arm 1-60 Min-No Report Result Date: 09/12/2024 Fluoroscopy was utilized by the requesting physician.  No radiographic interpretation.   DG Femur Min 2 Views Left Result Date: 09/11/2024 CLINICAL DATA:  Clemens.  Left hip pain. EXAM: LEFT FEMUR 2 VIEWS COMPARISON:  Prior radiographs from 2025 FINDINGS: Long lateral sideplate and compression screws related to a prior distal femur fracture. No complicating features. The left hip is normally located. Nondisplaced fractures. Nondisplaced fractures of the lower femoral neck and also the intertrochanteric region. IMPRESSION: 1. Lower left femoral neck and intertrochanteric fractures, nondisplaced. 2. Remote posttraumatic and postsurgical changes involving the distal femur. Electronically Signed   By: MYRTIS Stammer M.D.   On: 09/11/2024 17:23   DG Hip Unilat With Pelvis 2-3 Views Left Result Date: 09/11/2024 CLINICAL DATA:  Clemens.  Left hip pain. EXAM: DG HIP (WITH OR WITHOUT PELVIS) 2-3V LEFT COMPARISON:  Radiographs from 2022 FINDINGS: Right hip hardware intact. No complicating features. The left hip is normally located. Nondisplaced intertrochanteric fracture. No definite pelvic fractures. IMPRESSION: Nondisplaced intertrochanteric fracture of the left hip. Electronically Signed   By: MYRTIS Stammer M.D.   On: 09/11/2024 17:22   CT Lumbar Spine Wo Contrast Result Date: 09/11/2024 CLINICAL DATA:  Unwitnessed fall.  Back pain. EXAM: CT LUMBAR SPINE  WITHOUT CONTRAST TECHNIQUE: Multidetector CT imaging of the lumbar spine was performed without intravenous contrast administration. Multiplanar CT image reconstructions were also generated. RADIATION DOSE REDUCTION: This exam was performed according to the departmental dose-optimization program which includes automated exposure control, adjustment of the mA and/or kV according to patient size and/or use of iterative reconstruction technique. COMPARISON:  Prior CT scan from 2015 FINDINGS: Segmentation: Could There are five lumbar type vertebral bodies. The last full intervertebral disc space is labeled L5-S1. Alignment: Significant left convex scoliosis but normal overall alignment in the sagittal plane. Vertebrae: Stable fusion hardware at L2-3. No complicating features. No acute lumbar spine fracture. No upper pelvic fractures. There is a displaced fracture involving the right twelfth rib. No transverse process fractures. Paraspinal and other soft tissues: No significant paraspinal findings. Advanced vascular disease but no aneurysm. Severe colonic diverticulosis. Disc levels: No large disc protrusions or significant canal stenosis. IMPRESSION: 1. Displaced fracture involving the posterior right twelfth rib. 2. No acute lumbar spine fracture. 3. Stable fusion hardware at L2-3. 4. Significant left convex scoliosis. 5. No large disc protrusions or significant canal stenosis. 6. Aortic atherosclerosis. Aortic Atherosclerosis (ICD10-I70.0). Electronically Signed   By: MYRTIS Stammer M.D.   On: 09/11/2024 17:20   CT PELVIS WO CONTRAST Result Date: 09/11/2024 CLINICAL DATA:  Clemens.  Left hip pain. EXAM: CT PELVIS WITHOUT CONTRAST TECHNIQUE: Multidetector CT imaging of the pelvis was performed following the standard protocol without intravenous contrast. RADIATION DOSE REDUCTION: This exam was performed according to the departmental dose-optimization program which includes automated exposure control, adjustment of the mA  and/or kV according to patient size and/or use of iterative reconstruction technique. COMPARISON:  Radiographs, same date. FINDINGS: Both hips are normally located. There are nondisplaced fractures of the left femoral neck and intertrochanteric region. The pubic symphysis and SI joints are intact. No pelvic fractures are identified. Right hip hardware related to prior fracture fixation with gamma nail and dynamic hip screw. No complicating features. No significant intrapelvic abnormalities are identified. Severe sigmoid colon diverticulosis. Advanced aortic and iliac artery calcifications but no aneurysm. IMPRESSION: 1. Nondisplaced fractures of the left femoral neck and intertrochanteric region. 2. Right hip hardware related to prior fracture fixation with gamma nail and dynamic hip screw. No complicating features. 3. Severe sigmoid colon diverticulosis. 4. Aortic atherosclerosis. Aortic Atherosclerosis (ICD10-I70.0). Electronically Signed   By: MYRTIS Stammer M.D.   On: 09/11/2024 17:16   CT Head Wo Contrast Result Date: 09/11/2024 CLINICAL  DATA:  Unwitnessed fall. EXAM: CT HEAD WITHOUT CONTRAST CT CERVICAL SPINE WITHOUT CONTRAST TECHNIQUE: Multidetector CT imaging of the head and cervical spine was performed following the standard protocol without intravenous contrast. Multiplanar CT image reconstructions of the cervical spine were also generated. RADIATION DOSE REDUCTION: This exam was performed according to the departmental dose-optimization program which includes automated exposure control, adjustment of the mA and/or kV according to patient size and/or use of iterative reconstruction technique. COMPARISON:  04/10/2024 FINDINGS: CT HEAD FINDINGS Brain: Stable age related cerebral atrophy, ventriculomegaly and periventricular white matter disease. No extra-axial fluid collections are identified. No CT findings for acute hemispheric infarction or intracranial hemorrhage. No mass lesions. The brainstem and  cerebellum grossly normal. There is a small probable lacunar type infarct in the brainstem. Vascular: Stable vascular calcifications. Skull: No acute skull fracture. Sinuses/Orbits: The paranasal sinuses and mastoid air cells are clear. The globes are intact. Other: No scalp lesion or scalp hematoma. CT CERVICAL SPINE FINDINGS Alignment: Degenerative cervical spondylosis with mild multilevel degenerative subluxations but the overall alignment is maintained. Skull base and vertebrae: No acute fracture. No primary bone lesion or focal pathologic process. Soft tissues and spinal canal: No prevertebral fluid or swelling. No visible canal hematoma. Disc levels: The spinal canal is generous. No significant canal stenosis. Upper chest: No significant findings. Other: Bilateral carotid artery calcifications. Stable right thyroid  goiter. IMPRESSION: 1. Stable age related cerebral atrophy, ventriculomegaly and periventricular white matter disease. 2. No acute intracranial findings or skull fracture. 3. Degenerative cervical spondylosis with mild multilevel degenerative subluxations but no acute cervical spine fracture. Electronically Signed   By: MYRTIS Stammer M.D.   On: 09/11/2024 17:11   CT Cervical Spine Wo Contrast Result Date: 09/11/2024 CLINICAL DATA:  Unwitnessed fall. EXAM: CT HEAD WITHOUT CONTRAST CT CERVICAL SPINE WITHOUT CONTRAST TECHNIQUE: Multidetector CT imaging of the head and cervical spine was performed following the standard protocol without intravenous contrast. Multiplanar CT image reconstructions of the cervical spine were also generated. RADIATION DOSE REDUCTION: This exam was performed according to the departmental dose-optimization program which includes automated exposure control, adjustment of the mA and/or kV according to patient size and/or use of iterative reconstruction technique. COMPARISON:  04/10/2024 FINDINGS: CT HEAD FINDINGS Brain: Stable age related cerebral atrophy, ventriculomegaly  and periventricular white matter disease. No extra-axial fluid collections are identified. No CT findings for acute hemispheric infarction or intracranial hemorrhage. No mass lesions. The brainstem and cerebellum grossly normal. There is a small probable lacunar type infarct in the brainstem. Vascular: Stable vascular calcifications. Skull: No acute skull fracture. Sinuses/Orbits: The paranasal sinuses and mastoid air cells are clear. The globes are intact. Other: No scalp lesion or scalp hematoma. CT CERVICAL SPINE FINDINGS Alignment: Degenerative cervical spondylosis with mild multilevel degenerative subluxations but the overall alignment is maintained. Skull base and vertebrae: No acute fracture. No primary bone lesion or focal pathologic process. Soft tissues and spinal canal: No prevertebral fluid or swelling. No visible canal hematoma. Disc levels: The spinal canal is generous. No significant canal stenosis. Upper chest: No significant findings. Other: Bilateral carotid artery calcifications. Stable right thyroid  goiter. IMPRESSION: 1. Stable age related cerebral atrophy, ventriculomegaly and periventricular white matter disease. 2. No acute intracranial findings or skull fracture. 3. Degenerative cervical spondylosis with mild multilevel degenerative subluxations but no acute cervical spine fracture. Electronically Signed   By: MYRTIS Stammer M.D.   On: 09/11/2024 17:11   DG Chest Portable 1 View Result Date: 09/11/2024 CLINICAL DATA:  Unwitnessed fall.  EXAM: PORTABLE CHEST 1 VIEW COMPARISON:  04/10/2024 FINDINGS: The cardiac silhouette, mediastinal and hilar contours are within normal limits for age and stable. Stable tortuosity and calcification of the thoracic aorta. No acute pulmonary findings. No pleural effusion, pneumothorax or pulmonary contusion. The bony thorax is intact.  No definite rib fractures. IMPRESSION: No acute cardiopulmonary findings. Electronically Signed   By: MYRTIS Stammer M.D.   On:  09/11/2024 17:05    Microbiology: Results for orders placed or performed during the hospital encounter of 09/11/24  Urine Culture     Status: Abnormal   Collection Time: 09/11/24  5:58 PM   Specimen: Urine, Clean Catch  Result Value Ref Range Status   Specimen Description   Final    URINE, CLEAN CATCH Performed at [facility], [address] [place], [place] [address]    Special Requests   Final    NONE Performed at [facility], [address] [place], [place] [address]    Culture >=100,000 COLONIES/mL ESCHERICHIA COLI (A)  Final   Report Status 09/13/2024 FINAL  Final   Organism ID, Bacteria ESCHERICHIA COLI (A)  Final      Susceptibility   Escherichia coli - MIC*    AMPICILLIN >=32 RESISTANT Resistant     CEFAZOLIN  (URINE) Value in next row Sensitive      <=1 SENSITIVEThis is a modified FDA-approved test that has been validated and its performance characteristics determined by the reporting laboratory.  This laboratory is certified under the Clinical Laboratory Improvement Amendments CLIA as qualified to perform high complexity clinical laboratory testing.    CEFEPIME Value in next row Sensitive      <=1 SENSITIVEThis is a modified FDA-approved test that has been validated and its performance characteristics determined by the reporting laboratory.  This laboratory is certified under the Clinical Laboratory Improvement Amendments CLIA as qualified to perform high complexity clinical laboratory testing.    ERTAPENEM Value in next row Sensitive      <=1 SENSITIVEThis is a modified FDA-approved test that has been validated and its performance characteristics determined by the reporting laboratory.  This laboratory is certified under the Clinical Laboratory Improvement Amendments CLIA as qualified to perform high complexity clinical laboratory testing.    CEFTRIAXONE  Value in next row Sensitive      <=1 SENSITIVEThis is a modified FDA-approved  test that has been validated and its performance characteristics determined by the reporting laboratory.  This laboratory is certified under the Clinical Laboratory Improvement Amendments CLIA as qualified to perform high complexity clinical laboratory testing.    CIPROFLOXACIN Value in next row Sensitive      <=1 SENSITIVEThis is a modified FDA-approved test that has been validated and its performance characteristics determined by the reporting laboratory.  This laboratory is certified under the Clinical Laboratory Improvement Amendments CLIA as qualified to perform high complexity clinical laboratory testing.    GENTAMICIN Value in next row Sensitive      <=1 SENSITIVEThis is a modified FDA-approved test that has been validated and its performance characteristics determined by the reporting laboratory.  This laboratory is certified under the Clinical Laboratory Improvement Amendments CLIA as qualified to perform high complexity clinical laboratory testing.    NITROFURANTOIN Value in next row Sensitive      <=1 SENSITIVEThis is a modified FDA-approved test that has been validated and its performance characteristics determined by the reporting laboratory.  This laboratory is certified under the Clinical Laboratory Improvement Amendments CLIA as qualified to perform high complexity  clinical laboratory testing.    TRIMETH /SULFA  Value in next row Resistant      <=1 SENSITIVEThis is a modified FDA-approved test that has been validated and its performance characteristics determined by the reporting laboratory.  This laboratory is certified under the Clinical Laboratory Improvement Amendments CLIA as qualified to perform high complexity clinical laboratory testing.    AMPICILLIN/SULBACTAM Value in next row Intermediate      <=1 SENSITIVEThis is a modified FDA-approved test that has been validated and its performance characteristics determined by the reporting laboratory.  This laboratory is certified under the  Clinical Laboratory Improvement Amendments CLIA as qualified to perform high complexity clinical laboratory testing.    PIP/TAZO Value in next row Sensitive ug/mL     <=4 SENSITIVEThis is a modified FDA-approved test that has been validated and its performance characteristics determined by the reporting laboratory.  This laboratory is certified under the Clinical Laboratory Improvement Amendments CLIA as qualified to perform high complexity clinical laboratory testing.    MEROPENEM Value in next row Sensitive      <=4 SENSITIVEThis is a modified FDA-approved test that has been validated and its performance characteristics determined by the reporting laboratory.  This laboratory is certified under the Clinical Laboratory Improvement Amendments CLIA as qualified to perform high complexity clinical laboratory testing.    * >=100,000 COLONIES/mL ESCHERICHIA COLI  Surgical PCR screen     Status: None   Collection Time: 09/12/24  1:55 AM   Specimen: Nasal Mucosa; Nasal Swab  Result Value Ref Range Status   MRSA, PCR NEGATIVE NEGATIVE Final   Staphylococcus aureus NEGATIVE NEGATIVE Final    Comment: (NOTE) The Xpert SA Assay (FDA approved for NASAL specimens in patients 57 years of age and older), is one component of a comprehensive surveillance program. It is not intended to diagnose infection nor to guide or monitor treatment. Performed at [facility], [address] [place], [place] [address]    Labs: CBC: Recent Labs  Lab 09/11/24 1545 09/12/24 0316 09/13/24 0256 09/14/24 0312 09/14/24 0911  WBC 9.2 9.7 8.1 8.1 7.6  NEUTROABS 6.4  --   --   --   --   HGB 10.7* 10.2* 9.1* 7.7* 8.3*  HCT 33.2* 31.8* 28.3* 24.2* 26.3*  MCV 88.8 89.3 89.6 91.0 92.0  PLT 220 207 152 174 189   Basic Metabolic Panel: Recent Labs  Lab 09/11/24 1545 09/12/24 0316 09/13/24 0858 09/14/24 0911  NA 135 134* 134* 136  K 4.1 3.6 4.2 4.1  CL 100 99 100 99  CO2 24 24 24 23   GLUCOSE 108* 113* 100*  97  BUN 22 16 22  38*  CREATININE 0.66 0.71 0.60 0.95  CALCIUM 9.3 8.4* 8.3* 8.1*  MG  --  1.6*  --  2.7*  PHOS  --  2.6  --   --    Liver Function Tests: Recent Labs  Lab 09/11/24 1545  AST 20  ALT 8  ALKPHOS 74  BILITOT 0.6  PROT 7.5  ALBUMIN 3.9   CBG: No results for input(s): GLUCAP in the last 168 hours.  Discharge time spent: greater than 30 minutes.  Author: Yetta Blanch, MD  [facility] 09/14/2024

## 2024-09-15 NOTE — Transitions of Care (Post Inpatient/ED Visit) (Signed)
 09/15/2024  Name: [name] Barker MRN: [id number] DOB: March 30, 1921  Today's TOC FU Call Status: Today's TOC FU Call Status:: Successful TOC FU Call Completed TOC FU Call Complete Date: 09/15/24 Patient's Name and Date of Birth confirmed.  Transition Care Management Follow-up Telephone Call Date of Discharge: 09/14/24 Discharge Facility: [facility]) Type of Discharge: Inpatient Admission Primary Inpatient Discharge Diagnosis:: Fall; ORIF of Left Hip How have you been since you were released from the hospital?: Better Any questions or concerns?: No  Items Reviewed: Did you receive and understand the discharge instructions provided?: Yes Medications obtained,verified, and reconciled?: Yes (Medications Reviewed) Any new allergies since your discharge?: No Dietary orders reviewed?: NA Do you have support at home?: Yes People in Home [RPT]: child(ren), adult Name of Support/Comfort Primary Source: Ronal Freud  Medications Reviewed Today: Medications Reviewed Today     Reviewed by Moises Reusing, RN (Case Manager) on 09/15/24 at 1225  Med List Status: <None>   Medication Order Taking? Sig Documenting Provider Last Dose Status Informant  acetaminophen  (TYLENOL ) 500 MG tablet [id number] No Take 500 mg by mouth 2 (two) times a day. FOR BACK PAIN [provider] Past Week Active Family Member, Pharmacy Records  albuterol  (VENTOLIN  HFA) 108 (90 Base) MCG/ACT inhaler [id number] No INHALE 2 PUFFS BY MOUTH EVERY 6 HOURS AS NEEDED FOR WHEEZING OR FOR SHORTNESS OF BREATH  Patient taking differently: Inhale 2 puffs into the lungs every 6 (six) hours as needed for shortness of breath or wheezing. INHALE 2 PUFFS BY MOUTH EVERY 6 HOURS AS NEEDED FOR WHEEZING OR FOR SHORTNESS OF BREATH   Rollene Almarie LABOR, MD Past Month Active Family Member, Pharmacy Records  aspirin  325 MG tablet [id number]  Take 1 tablet (325 mg total) by mouth daily. Danton Lauraine LABOR, PA-C  Active   busPIRone   (BUSPAR ) 5 MG tablet [id number]  Take 1 tablet (5 mg total) by mouth 2 (two) times daily as needed. Tobie Yetta HERO, MD  Active   cefUROXime  (CEFTIN ) 250 MG tablet [id number]  Take 1 tablet (250 mg total) by mouth 2 (two) times daily with a meal for 3 days. Tobie Yetta HERO, MD  Active   cholecalciferol  (CHOLECALCIFEROL ) 25 MCG tablet [id number]  Take 1 tablet (1,000 Units total) by mouth daily. Danton Lauraine LABOR, PA-C  Active   docusate (COLACE) 50 MG/5ML liquid [id number]  Take 10 mLs (100 mg total) by mouth 2 (two) times daily. Tobie Yetta HERO, MD  Active   famotidine  (PEPCID ) 20 MG tablet [id number] No Take 1 tablet (20 mg total) by mouth at bedtime.  Patient not taking: Reported on 09/13/2024   Rollene Almarie LABOR, MD Not Taking Active Family Member, Pharmacy Records  feeding supplement (ENSURE PLUS HIGH PROTEIN) LIQD [id number]  Take 237 mLs by mouth 3 (three) times daily between meals. Tobie Yetta HERO, MD  Active   fluticasone  (FLONASE ) 50 MCG/ACT nasal spray [id number] No Place 1 spray into both nostrils in the morning and at bedtime. [provider] Past Week Active Family Member, Pharmacy Records  oxyCODONE  (OXY IR/ROXICODONE ) 5 MG immediate release tablet [id number]  Take 0.5-1 tablets (2.5-5 mg total) by mouth every 4 (four) hours as needed for severe pain (pain score 7-10). Danton Lauraine LABOR, PA-C  Active   Patient not taking:  Discontinued 02/05/21 1418 (Patient Preference)   QUEtiapine  (SEROQUEL ) 25 MG tablet [id number] No Take 0.5-1 tablets (12.5-25 mg total) by mouth 2 (two) times daily as needed (agitation). Rollene Almarie LABOR, MD Past Week  Active Family Member, Pharmacy Records  traZODone  (DESYREL ) 50 MG tablet [id number] No Take 1 tablet (50 mg total) by mouth at bedtime. Rollene Almarie LABOR, MD Past Week Active Family Member, Pharmacy Records            Home Care and Equipment/Supplies: Were Home Health Services Ordered?: Yes Name of Home Health Agency:: [facility] Has  Agency set up a time to come to your home?: No EMR reviewed for Home Health Orders: Orders present/patient has not received call (refer to CM for follow-up) Any new equipment or medical supplies ordered?: No  Functional Questionnaire: Do you need assistance with bathing/showering or dressing?: Yes (The family provides assistance of one) Do you need assistance with meal preparation?: Yes (The family provides assistance of one) Do you need assistance with eating?: No Do you have difficulty maintaining continence: Yes (The family provides assistance of one) Do you need assistance with getting out of bed/getting out of a chair/moving?: Yes (The family provides assistance of one) Do you have difficulty managing or taking your medications?: Yes (The family provides assistance of one)  Follow up appointments reviewed: PCP Follow-up appointment confirmed?: No (The patient's daughter will call to set up a virtual appointment) MD Provider Line [phone] Given: No Specialist Hospital Follow-up appointment confirmed?: No Reason Specialist Follow-Up Not Confirmed: Patient has Specialist Provider Number and will Call for Appointment Do you need transportation to your follow-up appointment?: No Do you understand care options if your condition(s) worsen?: Yes-patient verbalized understanding  SDOH Interventions Today    Flowsheet Row Most Recent Value  SDOH Interventions   Food Insecurity Interventions Intervention Not Indicated  Housing Interventions Intervention Not Indicated  Transportation Interventions Intervention Not Indicated  Utilities Interventions Intervention Not Indicated    Medford Balboa, BSN, RN [place]  [facility] - [facility] RN Care Manager [phone]

## 2024-09-20 ENCOUNTER — Encounter: Payer: Self-pay | Admitting: Internal Medicine

## 2024-09-20 DIAGNOSIS — S72002A Fracture of unspecified part of neck of left femur, initial encounter for closed fracture: Secondary | ICD-10-CM

## 2024-09-23 MED ORDER — NITROFURANTOIN MONOHYD MACRO 100 MG PO CAPS: 100.0000 mg | ORAL_CAPSULE | Freq: Every day | ORAL | 1 refills | Status: AC

## 2024-09-23 NOTE — Telephone Encounter (Signed)
 I have sent in the wheelchair order and the antibiotic for daily. Can you let [facility] know and help them get this and let daughter know.

## 2024-10-05 NOTE — Telephone Encounter (Signed)
 I believe patient is asking for a power wheel chair

## 2024-10-23 ENCOUNTER — Other Ambulatory Visit: Payer: Self-pay | Admitting: Internal Medicine

## 2024-11-01 ENCOUNTER — Encounter: Payer: Self-pay | Admitting: Internal Medicine

## 2024-11-02 MED ORDER — BUSPIRONE HCL 5 MG PO TABS
5.0000 mg | ORAL_TABLET | Freq: Three times a day (TID) | ORAL | 3 refills | Status: AC | PRN
Start: 2024-11-02 — End: ?

## 2025-01-31 ENCOUNTER — Ambulatory Visit: Admitting: Internal Medicine

## 2025-01-31 VITALS — BP 130/70 | HR 63 | Temp 98.3°F | Ht 61.0 in | Wt 92.4 lb

## 2025-01-31 DIAGNOSIS — Z85038 Personal history of other malignant neoplasm of large intestine: Secondary | ICD-10-CM

## 2025-01-31 DIAGNOSIS — F02C11 Dementia in other diseases classified elsewhere, severe, with agitation: Secondary | ICD-10-CM

## 2025-01-31 NOTE — Progress Notes (Unsigned)
- ° °  Subjective:   Patient ID: [name] Barker, female    DOB: [birth year], 89 y.o.   MRN: [id number]  Discussed the use of AI scribe software for clinical note transcription with the patient, who gave verbal consent to proceed.  History of Present Illness [name] Barker is a 89 year old female with hx colon cancer who presents with abdominal pain.  She has been experiencing episodic abdominal pain for the past three weeks, with some days being better than others. Initially, it was suspected to be a urinary tract infection, and she was treated with nitrofurantoin , 100 mg twice daily, for five days. The treatment seemed to improve her symptoms, but the pain recurred the morning after completing the course.  Her daughter reports that her abdomen appears larger, although she has not been feeling it herself. Her bowel movements occur every three days, are soft, and in good amounts, with no recent blood in the stool. Her appetite remains normal, and she has a good breakfast every morning. Her weight has been stable, with a slow decline over time, but nothing dramatic. The abdominal pain does not seem to be associated with meals, as she experienced pain before breakfast but not after eating.  Her past surgical history includes appendectomy and hysterectomy  Review of Systems  Unable to perform ROS: Dementia  Gastrointestinal:  Positive for abdominal pain.    Objective:  Physical Exam Constitutional:      Appearance: She is well-developed.  HENT:     Head: Normocephalic and atraumatic.  Cardiovascular:     Rate and Rhythm: Normal rate and regular rhythm.  Pulmonary:     Effort: Pulmonary effort is normal. No respiratory distress.     Breath sounds: Normal breath sounds. No wheezing or rales.  Abdominal:     General: Bowel sounds are normal. There is no distension.     Palpations: Abdomen is soft.     Tenderness: There is abdominal tenderness.     Comments: Epigastric tenderness,  no mass or rebound, good BS  Musculoskeletal:     Cervical back: Normal range of motion.  Skin:    General: Skin is warm and dry.  Neurological:     Mental Status: She is alert. Mental status is at baseline.     Coordination: Coordination abnormal.     Vitals:   01/31/25 1355  BP: 130/70  Pulse: 63  Temp: 98.3 F (36.8 C)  TempSrc: Oral  SpO2: 97%  Weight: 92 lb 6.4 oz (41.9 kg)  Height: 5' 1 (1.549 m)    Assessment and Plan Assessment & Plan Abdominal pain   Intermittent pain for three weeks suggests possible acid reflux, gastritis, or post-surgical changes. No obstruction or significant bowel issues are present. Restart Pepcid  once daily which she used to take and monitor symptoms for two to three days. We discussed work in the setting of goals of care. Aggressive work up is not recommended or desired. If needed we could do x-ray abdomen or labs however we did not do that today. If pepcid  ineffective we can revisit.   Dementia with behavioral disturbance   Agitation and insomnia are managed with Buspar , Trazodone , and Seroquel . Adjustments have been made for agitation and sleep. Continue current medications and contact the provider if needed.

## 2025-02-03 ENCOUNTER — Encounter: Payer: Self-pay | Admitting: Internal Medicine

## 2025-02-03 NOTE — Assessment & Plan Note (Signed)
 It is not impossible a recurrence with new symptoms but symptoms are not typical. There is no change in bowels recently.

## 2025-06-09 ENCOUNTER — Ambulatory Visit
